# Patient Record
Sex: Female | Born: 1935 | Race: White | Hispanic: No
Health system: Southern US, Community
[De-identification: ages and names within clinical notes are randomized; demographics above are authoritative.]

## PROBLEM LIST (undated history)

## (undated) DIAGNOSIS — I493 Ventricular premature depolarization: Secondary | ICD-10-CM

## (undated) DIAGNOSIS — I447 Left bundle-branch block, unspecified: Secondary | ICD-10-CM

## (undated) DIAGNOSIS — E039 Hypothyroidism, unspecified: Secondary | ICD-10-CM

## (undated) DIAGNOSIS — M1990 Unspecified osteoarthritis, unspecified site: Secondary | ICD-10-CM

## (undated) DIAGNOSIS — E7800 Pure hypercholesterolemia, unspecified: Secondary | ICD-10-CM

## (undated) DIAGNOSIS — J45909 Unspecified asthma, uncomplicated: Secondary | ICD-10-CM

## (undated) DIAGNOSIS — K219 Gastro-esophageal reflux disease without esophagitis: Secondary | ICD-10-CM

## (undated) HISTORY — PX: KNEE ARTHROSCOPY: SHX127

---

## 1999-03-25 ENCOUNTER — Ambulatory Visit (HOSPITAL_COMMUNITY): Admission: RE | Admit: 1999-03-25 | Discharge: 1999-03-25 | Payer: Self-pay | Admitting: Geriatric Medicine

## 1999-10-23 ENCOUNTER — Emergency Department (HOSPITAL_COMMUNITY): Admission: EM | Admit: 1999-10-23 | Discharge: 1999-10-23 | Payer: Self-pay | Admitting: Emergency Medicine

## 1999-11-04 ENCOUNTER — Ambulatory Visit (HOSPITAL_COMMUNITY): Admission: RE | Admit: 1999-11-04 | Discharge: 1999-11-04 | Payer: Self-pay | Admitting: Orthopedic Surgery

## 1999-11-07 ENCOUNTER — Encounter: Admission: RE | Admit: 1999-11-07 | Discharge: 1999-11-07 | Payer: Self-pay | Admitting: Orthopedic Surgery

## 1999-11-07 ENCOUNTER — Encounter: Payer: Self-pay | Admitting: Orthopedic Surgery

## 2000-07-27 ENCOUNTER — Encounter: Admission: RE | Admit: 2000-07-27 | Discharge: 2000-07-27 | Payer: Self-pay | Admitting: Geriatric Medicine

## 2000-07-27 ENCOUNTER — Encounter: Payer: Self-pay | Admitting: Geriatric Medicine

## 2000-09-02 ENCOUNTER — Encounter: Admission: RE | Admit: 2000-09-02 | Discharge: 2000-09-02 | Payer: Self-pay | Admitting: Geriatric Medicine

## 2000-09-02 ENCOUNTER — Encounter: Payer: Self-pay | Admitting: Geriatric Medicine

## 2000-10-13 ENCOUNTER — Ambulatory Visit (HOSPITAL_COMMUNITY): Admission: RE | Admit: 2000-10-13 | Discharge: 2000-10-13 | Payer: Self-pay | Admitting: Gastroenterology

## 2002-11-21 ENCOUNTER — Emergency Department (HOSPITAL_COMMUNITY): Admission: EM | Admit: 2002-11-21 | Discharge: 2002-11-21 | Payer: Self-pay | Admitting: Emergency Medicine

## 2003-11-21 ENCOUNTER — Other Ambulatory Visit: Admission: RE | Admit: 2003-11-21 | Discharge: 2003-11-21 | Payer: Self-pay | Admitting: Geriatric Medicine

## 2003-11-24 ENCOUNTER — Encounter: Admission: RE | Admit: 2003-11-24 | Discharge: 2003-11-24 | Payer: Self-pay | Admitting: Geriatric Medicine

## 2004-05-19 ENCOUNTER — Emergency Department (HOSPITAL_COMMUNITY): Admission: EM | Admit: 2004-05-19 | Discharge: 2004-05-19 | Payer: Self-pay | Admitting: Family Medicine

## 2004-10-05 ENCOUNTER — Emergency Department (HOSPITAL_COMMUNITY): Admission: EM | Admit: 2004-10-05 | Discharge: 2004-10-05 | Payer: Self-pay | Admitting: Family Medicine

## 2005-01-12 ENCOUNTER — Emergency Department (HOSPITAL_COMMUNITY): Admission: EM | Admit: 2005-01-12 | Discharge: 2005-01-12 | Payer: Self-pay | Admitting: Family Medicine

## 2005-02-25 ENCOUNTER — Emergency Department (HOSPITAL_COMMUNITY): Admission: EM | Admit: 2005-02-25 | Discharge: 2005-02-25 | Payer: Self-pay | Admitting: Family Medicine

## 2005-04-13 ENCOUNTER — Emergency Department (HOSPITAL_COMMUNITY): Admission: EM | Admit: 2005-04-13 | Discharge: 2005-04-13 | Payer: Self-pay | Admitting: Family Medicine

## 2005-04-27 ENCOUNTER — Emergency Department (HOSPITAL_COMMUNITY): Admission: EM | Admit: 2005-04-27 | Discharge: 2005-04-27 | Payer: Self-pay | Admitting: Family Medicine

## 2005-09-10 ENCOUNTER — Encounter: Admission: RE | Admit: 2005-09-10 | Discharge: 2005-09-10 | Payer: Self-pay | Admitting: Geriatric Medicine

## 2008-10-25 ENCOUNTER — Encounter: Admission: RE | Admit: 2008-10-25 | Discharge: 2008-10-25 | Payer: Self-pay | Admitting: Allergy

## 2009-11-05 ENCOUNTER — Encounter: Admission: RE | Admit: 2009-11-05 | Discharge: 2009-11-05 | Payer: Self-pay | Admitting: Allergy

## 2009-11-05 ENCOUNTER — Encounter: Payer: Self-pay | Admitting: Pulmonary Disease

## 2009-12-14 ENCOUNTER — Encounter: Payer: Self-pay | Admitting: Pulmonary Disease

## 2010-02-13 DIAGNOSIS — J309 Allergic rhinitis, unspecified: Secondary | ICD-10-CM | POA: Insufficient documentation

## 2010-02-13 DIAGNOSIS — K219 Gastro-esophageal reflux disease without esophagitis: Secondary | ICD-10-CM | POA: Insufficient documentation

## 2010-02-14 ENCOUNTER — Ambulatory Visit: Payer: Self-pay | Admitting: Pulmonary Disease

## 2010-02-14 DIAGNOSIS — R0789 Other chest pain: Secondary | ICD-10-CM | POA: Insufficient documentation

## 2010-02-14 DIAGNOSIS — R490 Dysphonia: Secondary | ICD-10-CM | POA: Insufficient documentation

## 2010-02-14 DIAGNOSIS — J45909 Unspecified asthma, uncomplicated: Secondary | ICD-10-CM | POA: Insufficient documentation

## 2010-02-15 ENCOUNTER — Telehealth (INDEPENDENT_AMBULATORY_CARE_PROVIDER_SITE_OTHER): Payer: Self-pay | Admitting: *Deleted

## 2010-02-15 ENCOUNTER — Encounter: Payer: Self-pay | Admitting: Pulmonary Disease

## 2010-03-08 ENCOUNTER — Telehealth: Payer: Self-pay | Admitting: Pulmonary Disease

## 2010-03-12 ENCOUNTER — Telehealth: Payer: Self-pay | Admitting: Pulmonary Disease

## 2010-03-12 ENCOUNTER — Ambulatory Visit: Payer: Self-pay | Admitting: Pulmonary Disease

## 2010-03-12 LAB — CONVERTED CEMR LAB
BUN: 11 mg/dL (ref 6–23)
Calcium: 9.1 mg/dL (ref 8.4–10.5)
Creatinine, Ser: 0.8 mg/dL (ref 0.4–1.2)
GFR calc non Af Amer: 74.74 mL/min (ref >60)
Glucose, Bld: 85 mg/dL (ref 70–99)

## 2010-03-14 ENCOUNTER — Ambulatory Visit: Payer: Self-pay | Admitting: Internal Medicine

## 2010-03-15 ENCOUNTER — Telehealth (INDEPENDENT_AMBULATORY_CARE_PROVIDER_SITE_OTHER): Payer: Self-pay | Admitting: *Deleted

## 2010-03-17 ENCOUNTER — Emergency Department (HOSPITAL_COMMUNITY): Admission: EM | Admit: 2010-03-17 | Discharge: 2010-03-17 | Payer: Self-pay | Admitting: Family Medicine

## 2010-03-19 ENCOUNTER — Telehealth (INDEPENDENT_AMBULATORY_CARE_PROVIDER_SITE_OTHER): Payer: Self-pay | Admitting: *Deleted

## 2010-03-22 ENCOUNTER — Telehealth (INDEPENDENT_AMBULATORY_CARE_PROVIDER_SITE_OTHER): Payer: Self-pay | Admitting: *Deleted

## 2010-04-17 ENCOUNTER — Ambulatory Visit: Payer: Self-pay | Admitting: Pulmonary Disease

## 2010-04-19 ENCOUNTER — Telehealth (INDEPENDENT_AMBULATORY_CARE_PROVIDER_SITE_OTHER): Payer: Self-pay | Admitting: *Deleted

## 2010-05-02 ENCOUNTER — Encounter: Payer: Self-pay | Admitting: Pulmonary Disease

## 2010-07-04 NOTE — Assessment & Plan Note (Signed)
Summary: consult for chest pain, hoarseness, and asthma   Visit Type:  Initial Consult Copy to:  Patrice Paradise MD Primary Chirsty Armistead/Referring Kaelynn Igo:  Doran Durand MD  CC:  pulmonary consult.  History of Present Illness: The pt is a 77y/o female who I have been asked to see for multiple pulmonary symptoms and ?asthma control.  The pt has a h/o asthma diagnosed over 5 years ago, and tells me she was doing well until June of this year.  She began to have discomfort in her ribs, and they were sore to the touch.  She also developed sob, along with various upper airway symptoms.  She describes these as a hoarseness and classic globus sensation.  She denies true cough, but would feel like she was choking at times.  She was treated with a course of steroids, abx, increased advair to 500 strength.  She improved, but has occurred 2 more times.  She currently has pain in her ribs, but it is not pleuritic.  She feels along the lower costal margin and lower sternum, and radiates around into her back.  She denies any sob with sweeping, bringing groceries in from the car.  She will get winded going to mailbox.  Her weight has not changed significantly.  She has changed her advair to dulera just 2 days ago.  She had a recent cxr in June that was totally wnl.  Preventive Screening-Counseling & Management  Alcohol-Tobacco     Smoking Status: never  Past History:  Past Medical History: h/o asthma--followed by Dr. Malachi Carl E R D (ICD-530.81) ALLERGIC RHINITIS (ICD-477.9)    Past Surgical History: Knee surgery  Family History: Reviewed history and no changes required. MI--mother CHF--sister,brother Throat Cancer--Father Leukemia--brother, neice  Social History: Reviewed history and no changes required. Patient never smoked.  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  The patient denies productive cough, non-productive cough, coughing up blood, acid heartburn, indigestion, abdominal pain, difficulty swallowing, sore throat, tooth/dental problems, headaches, nasal congestion/difficulty breathing through nose, itching, depression, hand/feet swelling, rash, and fever.    Vital Signs:  Patient profile:   77 year old female Height:      62 inches Weight:      150.25 pounds BMI:     27.58 O2 Sat:      96 % on Room air Pulse rate:   78 / minute BP sitting:   130 / 84  (right arm) Cuff size:   regular  Vitals Entered By: Carver Fila (February 14, 2010 2:14 PM)  O2 Flow:  Room air CC: pulmonary consult Comments meds and allergies updated Phone number updated Carver Fila  February 14, 2010 2:14 PM    Physical Exam  General:  mildly ow female in nad Eyes:  PERRLA and EOMI.   Nose:  patent without discharge or purulence Mouth:  clear, no exudates or other abnormalities Neck:  no jvd, tmg, LN Lungs:  totally clear to auscultation, no wheezing or rhonchi decreased diaphragm excursion which interferes with depth of inspiration. Heart:  rrr, no mrg Abdomen:  soft and nontender, bs+ Extremities:  mild ankle edema, pulses intact distally no cyanosis  Neurologic:  alert and oriented, moves all 4.  Impression & Recommendations:  Problem # 1:  INTRINSIC ASTHMA, UNSPECIFIED (ICD-493.10) I do not feel any of her current symptoms are due to asthma.  Her spirometry is normal, and she is on a very good asthma regimen.  I support the change from advair to dulera to minimize irritation to the upper airway.  Problem # 2:  CHEST PAIN, ATYPICAL (ICD-786.59) her description of the pain sounds more MSK or radicular in nature.  She may need spine films to r/o vertebral fx with  nerve compression.  I cannot r/o VTE, but the history is not overly suggestive of this.  If she continues to have issues, can check ct chest to evaluate for intrathoracic pathology.  Problem # 3:  HOARSENESS (ZOX-096.04) the pt describes a classic globus sensation and hoarseness.  I have explained to her this is frequently due to postnasal drip, LPR, and meds.  I would like to intensify her reflux meds, add chlorpheniramine at bedtime, and agree with change to dulera.  I am unable to come up with a unifying diagnosis for her costal margin pain, asthma, and upper airway symptoms.  Medications Added to Medication List This Visit: 1)  Dulera 200-5 Mcg/act Aero (Mometasone furo-formoterol fum) .... One puff  two times a day 2)  Xopenex Hfa 45 Mcg/act Aero (Levalbuterol tartrate) .... One puff every 6 hrs as needed 3)  Saline Nasal Sprays  .... One spray in each nostrile two times a day 4)  Lovastatin 20 Mg Tabs (Lovastatin) .... Once daily 5)  Levothroid 100 Mcg Tabs (Levothyroxine sodium) .... Once daily 6)  Fish Oil 1000 Mg Caps (Omega-3 fatty acids) .... One tab three times a day 7)  Vitamin D3 1000 Unit Caps (Cholecalciferol) .Marland Kitchen.. 1 tab three times a day 8)  Calcium-magnesium-zinc 750-375-15 Mg Tabs (Calcium-magnesium-zinc) .Marland Kitchen.. 1 tab three times a day 9)  Omeprazole 40 Mg Cpdr (Omeprazole) .... One in am and pm  Other Orders: Consultation Level IV (54098)  Patient Instructions: 1)  stay on dulera, singulair, but will reduce dulera to the 100/5 strength. 2)  will change your omeprazole to 40mg  one in am and pm for next 2 weeks 3)  put fish oil in freezer, and take from there each day.  4)  trial of chlorpheniramine 8mg  at bedtime each night for next few weeks. 5)  use albuterol for rescue rather than xopenex. 6)  please call in 2 weeks to let me know how things are going.  If still having issues, will consider scan of your chest.  Prescriptions: OMEPRAZOLE 40 MG CPDR (OMEPRAZOLE) one in  am and pm  #60 x 0   Entered and Authorized by:   Barbaraann Share MD   Signed by:   Barbaraann Share MD on 02/14/2010   Method used:   Print then Give to Patient   RxID:   [id number]    Immunization History:  Influenza Immunization History:    Influenza:  historical (03/02/2009)

## 2010-07-04 NOTE — Progress Notes (Signed)
Summary: clarification on medications  Phone Note Call from Patient Call back at Home Phone [phone]   Caller: Patient Call For: clance Summary of Call: pt has a question re: meds.  Initial call taken by: Tivis Ringer, CNA,  February 15, 2010 8:57 AM  Follow-up for Phone Call        called spoke with patient who is requesting some clarification on her meds from consult yesterday: instruction sheet says to continue the singulair, but she would like to know if she is to continue the claritin 10mg  two times a day?  pt would also like some clarification on the differences between claritin, singulair and the chlor-tabs.   also, she understands that the dulera was decreased to 100-64mcg, but is she to take 1 puff two times a day or 2?  med list states 1 puff two times a day of the 200-5, but patient became a little confused when reading the pamplet included with her sample of dulera given at ov.  note: patient took 2 puffs dulera this morning.  please advise, thanks! Follow-up by: Boone Master CNA/MA,  February 15, 2010 10:11 AM  Additional Follow-up for Phone Call Additional follow up Details #1::        1) dulera is supposed to be 100/5 2 puffs am and pm.  the higher strength of dulera can be irritating to the airway. 2) the claritin is supposed to be dosed once a day..she is to take this in am, chlor tabs at hs.  chlor tab is a stronger antihistamine than claritin.  singulair is a completely different drug. Additional Follow-up by: Barbaraann Share MD,  February 15, 2010 4:31 PM    Additional Follow-up for Phone Call Additional follow up Details #2::    called spoke with patient, advised of KC's recs as  stated above.  pt was very grateful and verbalized her understanding.  claritin dosing changed on med list. Follow-up by: Boone Master CNA/MA,  February 15, 2010 5:04 PM  New/Updated Medications: CLARITIN 10 MG TABS (LORATADINE) Take 1 tablet by mouth every morning

## 2010-07-04 NOTE — Progress Notes (Signed)
Summary: question about med  Phone Note Call from Patient   Caller: Patient Call For: clance Summary of Call: need to no if she should take  omeprazole in the am or pm and should she stay on chlortabs or aller clear Initial call taken by: Rickard Patience,  March 22, 2010 10:23 AM  Follow-up for Phone Call        advised pt to call primary for questions and refills for the omeprazole per kc's last phone note documentation, pt was fine with this Follow-up by: Philipp Deputy CMA,  March 22, 2010 11:12 AM

## 2010-07-04 NOTE — Assessment & Plan Note (Signed)
Summary: rov for asthma, hoarseness.   Visit Type:  Follow-up Copy to:  Patrice Paradise MD Primary Provider/Referring Provider:  Doran Durand MD  CC:  f/u. pt states her breathing has been "fine". pt c/o raspy voice and post nasal drip. pt denies any cough. .  History of Present Illness: the pt comes in today for f/u of her known asthma and upper irritation.  She has done well on dulera from an asthma standpoint, with less ua irritation.  She still has some globus sensation and throat clearing, and tells me that when she was on omeprazole 40mg  two times a day this was much better.  She has had no further chest pain.  Current Medications (verified): 1)  Claritin 10 Mg Tabs (Loratadine) .... Take 1 Tablet By Mouth Every Morning 2)  Proair Hfa 108 (90 Base) Mcg/act  Aers (Albuterol Sulfate) .Marland Kitchen.. 1-2 Puffs Every 4-6 Hours As Needed 3)  Singulair 10 Mg Tabs (Montelukast Sodium) .... Take 1 Tablet By Mouth Once A Day 4)  Astelin 137 Mcg/spray Soln (Azelastine Hcl) .... 2 Sprays in Each Nostril Two Times A Day 5)  Dulera 200-5 Mcg/act Aero (Mometasone Furo-Formoterol Fum) .... One Puff  Two Times A Day 6)  Saline Nasal Sprays .... One Spray in Each Nostrile Two Times A Day 7)  Lovastatin 20 Mg Tabs (Lovastatin) .... Once Daily 8)  Levothroid 100 Mcg Tabs (Levothyroxine Sodium) .... Once Daily 9)  Fish Oil 1000 Mg Caps (Omega-3 Fatty Acids) .... One Tab Three Times A Day 10)  Vitamin D3 1000 Unit Caps (Cholecalciferol) .Marland Kitchen.. 1 Tab Three Times A Day 11)  Calcium-Magnesium-Zinc 750-375-15 Mg Tabs (Calcium-Magnesium-Zinc) .Marland Kitchen.. 1 Tab Three Times A Day 12)  Omeprazole 40 Mg Cpdr (Omeprazole) .... Take 1 Tablet By Mouth Once A Day 13)  Probiotic  Caps (Probiotic Product) .... Once Daily 14)  Stool Softener 100 Mg Caps (Docusate Sodium) .... Once Daily As Needed 15)  Artificial Tears  Soln (Artificial Tear Solution) .... As Needed  Allergies (verified): 1)  ! Pcn 2)  ! Erythromycin  Social  History: Patient never smoked.  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  The patient denies shortness of breath with activity, shortness of breath at rest, productive cough, non-productive cough, coughing up blood, chest pain, irregular heartbeats, acid heartburn, indigestion, weight change, abdominal pain, difficulty swallowing, sore throat, tooth/dental problems, headaches, nasal congestion/difficulty breathing through nose, sneezing, itching, ear ache, anxiety, depression, joint stiffness or pain, rash, change in color of mucus, and fever.    Vital Signs:  Patient profile:   77 year old female Height:      62 inches Weight:      150 pounds BMI:     27.53 O2 Sat:      96 % on Room air Temp:     97.7 degrees F oral Pulse rate:   73 / minute BP sitting:   120 / 76  (left arm) Cuff size:   regular  Vitals Entered By: Carver Fila (April 17, 2010 2:41 PM)  O2 Flow:  Room air CC: f/u. pt states her breathing has been "fine". pt c/o raspy voice, post nasal drip. pt denies any cough.  Comments meds and allergies updated Phone number updated Carver Fila  April 17, 2010 2:41 PM    Physical Exam  General:  wd female in nad Nose:  no purulence or discharge noted. Lungs:  totally clear to auscultation Heart:  rrr, no mrg Extremities:  no edema or cyanosis noted. Neurologic:  alert and oriented, moves all 4.   Impression & Recommendations:  Problem # 1:  INTRINSIC ASTHMA, UNSPECIFIED (ICD-493.10) the pt is doing well from an asthma standpoint, with rare rescue inhaler use.  Problem # 2:  HOARSENESS (ZOX-096.04)  I continue to believe this is due to postnasal drip and reflux.  I have asked her to stay on dulera since this is less irritating to the upper airway.  I think she will do better on two times a day PPI since she saw a difference in her hoarseness while taking  this.  Medications Added to Medication List This Visit: 1)  Probiotic Caps (Probiotic product) .... Once daily 2)  Stool Softener 100 Mg Caps (Docusate sodium) .... Once daily as needed 3)  Artificial Tears Soln (Artificial tear solution) .... As needed 4)  Omeprazole 40 Mg Cpdr (Omeprazole) .... One am and pm 5)  Dulera 100-5 Mcg/act Aero (Mometasone furo-formoterol fum) .... 2 puffs twice per day  Other Orders: Est. Patient Level III (54098)  Patient Instructions: 1)  stay on dulera 100/5  2 inhalatons am and pm...rinse mouth well 2)  increase omeprazole to 40mg  am and pm since this has helped in the past.   3)  followup with me in one year, but call if having problems.  Prescriptions: DULERA 100-5 MCG/ACT AERO (MOMETASONE FURO-FORMOTEROL FUM) 2 puffs twice per day  #3 x 6   Entered and Authorized by:   Barbaraann Share MD   Signed by:   Barbaraann Share MD on 04/17/2010   Method used:   Print then Give to Patient   RxID:   [id number] OMEPRAZOLE 40 MG CPDR (OMEPRAZOLE) one am and pm  #180 x 6   Entered and Authorized by:   Barbaraann Share MD   Signed by:   Barbaraann Share MD on 04/17/2010   Method used:   Print then Give to Patient   RxID:   [id number]    Immunization History:  Influenza Immunization History:    Influenza:  historical (03/02/2010)

## 2010-07-04 NOTE — Letter (Signed)
Summary: [facility]  [facility]   Imported By: Sherian Rein 02/19/2010 09:47:24  _____________________________________________________________________  External Attachment:    Type:   Image     Comment:   External Document

## 2010-07-04 NOTE — Medication Information (Signed)
Summary: [employer]   Imported By: Valinda Hoar 05/02/2010 16:44:31  _____________________________________________________________________  External Attachment:    Type:   Image     Comment:   External Document

## 2010-07-04 NOTE — Progress Notes (Signed)
Summary: omeprazole rx  40mg  once daily   Phone Note Call from Patient Call back at Home Phone [phone]   Caller: Patient Call For: clance Summary of Call: pt wants to know if she should get refill of omeprazole from kc since he increased her dosage to 40mg  or should she get rx from dr Rosemont Callas who had her on 20mg ? [facility].  Initial call taken by: Tivis Ringer, CNA,  March 19, 2010 9:31 AM  Follow-up for Phone Call        [facility], please advise if you will ok refills.  Pt was originally given rx for Omeprazole 20mg  by Dr. Epes Callas but Jonathon Bellows increased pt's Omeprazole to 40mg  per consult note on 02/14/2010 and was given rx for # 60 x 0 refills and was told to take x 2 weeks.  However,  pt called back on 03/08/2010 (see phone note for complete details).  Asked if pt's cough and hoarseness have improved on increased dose of omeprazole.  pt states she never had a cough but would clear her throat a lot- and pt states this has improved along with the hoarseness.  Will forward message to [facility] to address.  Aundra Millet Reynolds LPN  March 19, 2010 10:46 AM   Additional Follow-up for Phone Call Additional follow up Details #1::        I gave her enough for 30 days at 40mg  two times a day.  Don't mind giving her a prescription for 40mg  once a day (that is what she should decrease to) with no fills (# 30,no fills) since this involves her reflux and not a pulmonary issue, would prefer this be managed by her primary md after the above prescription runs out.   Additional Follow-up by: Barbaraann Share MD,  March 19, 2010 12:47 PM    Additional Follow-up for Phone Call Additional follow up Details #2::    [id number].  Aundra Millet Reynolds LPN  March 19, 2010 1:13 PM  Pt is returning call from triage. call her at home ph# now (cell phone is "dying"). Tivis Ringer, CNA  March 19, 2010 4:29 PM  called and spoke with pt. pt is aware of KC's recs.  Informed pt Omeprazole 40mg  once daily sent to pharmacy for 1 month  only and then future refills will need to go to pt's pcp.  Pt verbalized her understanding. Aundra Millet Reynolds LPN  March 19, 2010 4:56 PM   New/Updated Medications: OMEPRAZOLE 40 MG CPDR (OMEPRAZOLE) Take 1 tablet by mouth once a day Prescriptions: OMEPRAZOLE 40 MG CPDR (OMEPRAZOLE) Take 1 tablet by mouth once a day  #30 x 0   Entered by:   Arman Filter LPN   Authorized by:   Barbaraann Share MD   Signed by:   Arman Filter LPN on 09/81/1914   Method used:   Electronically to        [id number] (retail)       [address] [place], [place]  [address]       Ph: [phone]       Fax: [phone]   RxID:   [id number]

## 2010-07-04 NOTE — Progress Notes (Signed)
  Phone Note Call from Patient   Caller: libby Call For: Samantha [name] Summary of Call: pt has ct chest pulmonary emboli protocol@[facility]  03/14/10@2 :00pm when does she need to see you? Initial call taken by: Oneita Jolly,  March 12, 2010 9:23 AM  Follow-up for Phone Call        let her know that I will call her whenever I get the results. Follow-up by: Barbaraann Share MD,  March 12, 2010 12:12 PM

## 2010-07-04 NOTE — Progress Notes (Signed)
SummaryElwin Sleight PA  Phone Note Outgoing Call Call back at [phone]   Call placed by: Vernie Murders,  April 19, 2010 2:00 PM Call placed to: Insurer Summary of Call: Recieved PA request for dulera from the [facility].  Called [facility] to initiate- form placed in KC's lookat to be done. Initial call taken by: Vernie Murders,  April 19, 2010 2:21 PM  Follow-up for Phone Call        Form was filled out by [facility] and faxed to [facility].  Follow-up by: Vernie Murders,  May 01, 2010 4:30 PM  Additional Follow-up for Phone Call Additional follow up Details #1::        Recieved PA approval for this med through 05/03/11.  I faxed this information to the [employer] and scanned in approval letter. Additional Follow-up by: Vernie Murders,  May 02, 2010 3:11 PM

## 2010-07-04 NOTE — Progress Notes (Signed)
Summary: informed pt of results  Phone Note Call from Patient Call back at Home Phone [phone]   Caller: Patient Call For: clance Summary of Call: Returning Mindy's call. Initial call taken by: Darletta Moll,  March 15, 2010 8:16 AM  Follow-up for Phone Call        called and spoke wit pt and informed her of her results.  Carver Fila  March 15, 2010 1:33 PM

## 2010-07-04 NOTE — Progress Notes (Signed)
Summary: note from patient-  Phone Note Call from Patient Call back at Home Phone [phone]   Caller: pt Call For: Clance Summary of Call: Pt came by the office and dropped off a note for [facility] nurse. The note has several questions on it. The first question was did [facility] fax diagnosis and medication changes to Dr. Craven Callas, and Dr. Allena Katz. I advised that this has been done per EMR.  The second part of the note states that the pt symptoms of sore ribs, phlegm, and some SOB subsided for a week but recently the pain under her ribs has returned. Pt wants to know could this be associated with pleurisy or her heart? Pt also wants to know should she continue omeprazole 40mg ? I have LMTCBx1 to discuss all of the above with the patient. Per TD we need to tell the pt that we do not accept handwritten letters. Initial call taken by: Carron Curie CMA,  March 08, 2010 4:38 PM  Follow-up for Phone Call        pt returned call. call cell [phone]. Tivis Ringer, CNA  March 11, 2010 8:40 AM  called pt back.  Pt was seen by [facility] on 02-14-2010 for a pulmonary Consult and was told to call back in 2 weeks to give an update.  Pt states she is following KC's recs..... 1) dulera 100/5 2 puffs two times a day.  Pt states she sees " little improvement in her breathing."  Still has periods of sob with exertion. 2) pt is still taking Chlorpheniramine 8mg  at bedtime. Does she need to continue this. 3)  Pt states she is also still on Omeprazole 40mg  two times a day. Does she need to continue this at this strength as well?  If so, do you want to send refills or do you want her to get them from her pcp? 4)  Pt states after following KC's recs she noticed her symptoms improved x 1 week but has since restarted...she notices her "ribs are sore on the inside" again ( states it doesn't hurt to touch her ribs but states it's painful from the inside)  coughing but unable to get anything up and sob.  Will forward message to [facility] to  address.  Aundra Millet Reynolds LPN  March 11, 2010 9:54 AM   Additional Follow-up for Phone Call Additional follow up Details #1::        she had 3 issues: 1) asthma.Marland KitchenMarland KitchenI do not believe this has anything to do with her sob.  I want her to stay on dulera for maintenance since it is less irritating to the upper airway compared to advair 2) hoarseness...has her hoarseness and cough improved since being on chlorpheniramine and double dose omeprazole? 3) rib pain.Marland KitchenMarland KitchenMarland KitchenI suspect this has nothing to do with her lungs or heart.  Her description sounds musculoskeletal.  I am willing to do scan of chest if her symptoms are persisting  I suspect that we should do ct chest, then have her come in to discuss these different issues.  see if she is ok with this. Additional Follow-up by: Barbaraann Share MD,  March 11, 2010 4:58 PM    Additional Follow-up for Phone Call Additional follow up Details #2::    pt advised of KC recs.  Pt wants to do CT of chest. Per [facility] CHest with contrast and appt to see him after. Order placed.Carron Curie CMA  March 11, 2010 5:23 PM

## 2010-10-03 ENCOUNTER — Encounter (HOSPITAL_COMMUNITY): Payer: Self-pay

## 2010-10-18 NOTE — Procedures (Signed)
[place]  Patient:    Samantha [name], Samantha [name]                         MRN: [id number] Proc. Date: 10/13/00 Attending:  Verlin Grills, M.D. CC:         Hal T. Stoneking, M.D.   Procedure Report  PROCEDURE:  Screening colonoscopy.  REFERRING PHYSICIAN:  Hal T. Stoneking, M.D.  PROCEDURE INDICATION:  Ms. Mykaylah Ballman (date of birth 01-12-1930) is a 77 year old female who is due for screening colonoscopy with possible polypectomy to prevent colon cancer.  I discussed with Ms. Herrada the complications associated with colonoscopy and polypectomy, including intestinal bleeding and intestinal perforation. Ms. Tatlock has signed the operative permit.  ENDOSCOPIST:  Verlin Grills, M.D.  PREMEDICATION:  Versed 5 mg, fentanyl 50 mcg.  ENDOSCOPE:  Olympus pediatric colonoscope.  DESCRIPTION OF PROCEDURE:  After obtaining informed consent, the patient was placed in the left lateral decubitus position.  I administered intravenous fentanyl and intravenous Versed to achieve conscious sedation for the procedure.  The patients blood pressure, oxygen saturation, and cardiac rhythm were monitored throughout the procedure and documented in the medical record.  Anal inspection was normal.  Digital rectal exam was normal.  The Olympus pediatric video colonoscope was introduced into the rectum and under direct vision advanced to the cecum, as identified by a normal-appearing ileocecal valve.  Colonic preparation for the exam today was excellent.  Rectum normal.  Sigmoid colon and descending colon:  Left colonic diverticulosis.  Splenic flexure normal.  Transverse colon normal.  Hepatic flexure normal.  Ascending colon normal.  Cecum and ileocecal valve normal.  ASSESSMENT:  Left colonic diverticulosis; otherwise, normal proctocolonoscopy to the cecum.  No endoscopic evidence for the presence of colorectal neoplasia. DD:  10/13/00 TD:   10/13/00 Job: [id number]

## 2011-03-15 ENCOUNTER — Inpatient Hospital Stay (INDEPENDENT_AMBULATORY_CARE_PROVIDER_SITE_OTHER)
Admission: RE | Admit: 2011-03-15 | Discharge: 2011-03-15 | Disposition: A | Discharge status: Home / Self Care | Payer: Medicare Other | Source: Ambulatory Visit | Attending: Family Medicine | Admitting: Family Medicine

## 2011-03-15 DIAGNOSIS — R197 Diarrhea, unspecified: Secondary | ICD-10-CM

## 2011-03-15 DIAGNOSIS — B9789 Other viral agents as the cause of diseases classified elsewhere: Secondary | ICD-10-CM

## 2012-09-27 ENCOUNTER — Ambulatory Visit
Admission: RE | Admit: 2012-09-27 | Discharge: 2012-09-27 | Disposition: A | Discharge status: Home / Self Care | Payer: Medicare Other | Source: Ambulatory Visit | Attending: Chiropractic Medicine | Admitting: Chiropractic Medicine

## 2012-09-27 ENCOUNTER — Other Ambulatory Visit: Payer: Self-pay | Admitting: Chiropractic Medicine

## 2012-09-27 DIAGNOSIS — R52 Pain, unspecified: Secondary | ICD-10-CM

## 2014-05-22 ENCOUNTER — Observation Stay (HOSPITAL_COMMUNITY)
Admission: EM | Admit: 2014-05-22 | Discharge: 2014-05-23 | Disposition: A | Discharge status: Home / Self Care | Payer: Medicare HMO | Attending: Cardiology | Admitting: Cardiology

## 2014-05-22 ENCOUNTER — Emergency Department (HOSPITAL_COMMUNITY): Payer: Medicare HMO

## 2014-05-22 ENCOUNTER — Encounter (HOSPITAL_COMMUNITY): Payer: Self-pay | Admitting: Emergency Medicine

## 2014-05-22 DIAGNOSIS — E039 Hypothyroidism, unspecified: Secondary | ICD-10-CM | POA: Insufficient documentation

## 2014-05-22 DIAGNOSIS — I493 Ventricular premature depolarization: Secondary | ICD-10-CM | POA: Insufficient documentation

## 2014-05-22 DIAGNOSIS — I447 Left bundle-branch block, unspecified: Secondary | ICD-10-CM | POA: Insufficient documentation

## 2014-05-22 DIAGNOSIS — K219 Gastro-esophageal reflux disease without esophagitis: Secondary | ICD-10-CM | POA: Diagnosis not present

## 2014-05-22 DIAGNOSIS — E785 Hyperlipidemia, unspecified: Secondary | ICD-10-CM | POA: Diagnosis present

## 2014-05-22 DIAGNOSIS — E780 Pure hypercholesterolemia: Secondary | ICD-10-CM | POA: Diagnosis not present

## 2014-05-22 DIAGNOSIS — Z880 Allergy status to penicillin: Secondary | ICD-10-CM | POA: Diagnosis not present

## 2014-05-22 DIAGNOSIS — M1990 Unspecified osteoarthritis, unspecified site: Secondary | ICD-10-CM | POA: Insufficient documentation

## 2014-05-22 DIAGNOSIS — J45909 Unspecified asthma, uncomplicated: Secondary | ICD-10-CM | POA: Insufficient documentation

## 2014-05-22 DIAGNOSIS — R079 Chest pain, unspecified: Principal | ICD-10-CM | POA: Insufficient documentation

## 2014-05-22 DIAGNOSIS — Z7951 Long term (current) use of inhaled steroids: Secondary | ICD-10-CM | POA: Diagnosis not present

## 2014-05-22 DIAGNOSIS — Z79899 Other long term (current) drug therapy: Secondary | ICD-10-CM | POA: Insufficient documentation

## 2014-05-22 DIAGNOSIS — R0789 Other chest pain: Secondary | ICD-10-CM

## 2014-05-22 DIAGNOSIS — Z792 Long term (current) use of antibiotics: Secondary | ICD-10-CM | POA: Diagnosis not present

## 2014-05-22 HISTORY — DX: Hypothyroidism, unspecified: E03.9

## 2014-05-22 HISTORY — DX: Unspecified osteoarthritis, unspecified site: M19.90

## 2014-05-22 HISTORY — DX: Gastro-esophageal reflux disease without esophagitis: K21.9

## 2014-05-22 HISTORY — DX: Unspecified asthma, uncomplicated: J45.909

## 2014-05-22 HISTORY — DX: Pure hypercholesterolemia, unspecified: E78.00

## 2014-05-22 HISTORY — DX: Ventricular premature depolarization: I49.3

## 2014-05-22 HISTORY — DX: Left bundle-branch block, unspecified: I44.7

## 2014-05-22 LAB — COMPREHENSIVE METABOLIC PANEL
ALT: 18 U/L (ref 0–35)
AST: 24 U/L (ref 0–37)
Albumin: 3.8 g/dL (ref 3.5–5.2)
Alkaline Phosphatase: 66 U/L (ref 39–117)
Anion gap: 13 (ref 5–15)
BILIRUBIN TOTAL: 0.2 mg/dL — AB (ref 0.3–1.2)
BUN: 15 mg/dL (ref 6–23)
CALCIUM: 9.9 mg/dL (ref 8.4–10.5)
CHLORIDE: 103 meq/L (ref 96–112)
CO2: 23 meq/L (ref 19–32)
CREATININE: 0.86 mg/dL (ref 0.50–1.10)
GFR, EST AFRICAN AMERICAN: 71 mL/min — AB (ref >90)
GFR, EST NON AFRICAN AMERICAN: 61 mL/min — AB (ref >90)
GLUCOSE: 80 mg/dL (ref 70–99)
Potassium: 4.7 mEq/L (ref 3.7–5.3)
Sodium: 139 mEq/L (ref 137–147)
Total Protein: 7.4 g/dL (ref 6.0–8.3)

## 2014-05-22 LAB — CBC
HCT: 39.3 % (ref 36.0–46.0)
HEMOGLOBIN: 12.7 g/dL (ref 12.0–15.0)
MCH: 30.1 pg (ref 26.0–34.0)
MCHC: 32.3 g/dL (ref 30.0–36.0)
MCV: 93.1 fL (ref 78.0–100.0)
Platelets: 222 10*3/uL (ref 150–400)
RBC: 4.22 MIL/uL (ref 3.87–5.11)
RDW: 14.7 % (ref 11.5–15.5)
WBC: 12.2 10*3/uL — ABNORMAL HIGH (ref 4.0–10.5)

## 2014-05-22 LAB — PROTIME-INR
INR: 1.02 (ref 0.00–1.49)
Prothrombin Time: 13.5 seconds (ref 11.6–15.2)

## 2014-05-22 LAB — PRO B NATRIURETIC PEPTIDE: Pro B Natriuretic peptide (BNP): 442.8 pg/mL (ref 0–450)

## 2014-05-22 LAB — I-STAT TROPONIN, ED: TROPONIN I, POC: 0 ng/mL (ref 0.00–0.08)

## 2014-05-22 LAB — TSH: TSH: 3.87 u[IU]/mL (ref 0.350–4.500)

## 2014-05-22 LAB — TROPONIN I

## 2014-05-22 MED ORDER — PANTOPRAZOLE SODIUM 40 MG PO TBEC
80.0000 mg | DELAYED_RELEASE_TABLET | Freq: Every day | ORAL | Status: DC
  Administered 2014-05-23: 40 mg via ORAL
  Filled 2014-05-22: qty 2

## 2014-05-22 MED ORDER — AZELASTINE HCL 0.1 % NA SOLN
1.0000 | Freq: Two times a day (BID) | NASAL | Status: DC
  Administered 2014-05-22: 1 via NASAL
  Filled 2014-05-22: qty 30

## 2014-05-22 MED ORDER — MONTELUKAST SODIUM 10 MG PO TABS
10.0000 mg | ORAL_TABLET | Freq: Every day | ORAL | Status: DC
  Administered 2014-05-22: 10 mg via ORAL
  Filled 2014-05-22: qty 1

## 2014-05-22 MED ORDER — HEPARIN SODIUM (PORCINE) 5000 UNIT/ML IJ SOLN
5000.0000 [IU] | Freq: Three times a day (TID) | INTRAMUSCULAR | Status: DC
  Filled 2014-05-22: qty 1

## 2014-05-22 MED ORDER — LORATADINE 10 MG PO TABS
10.0000 mg | ORAL_TABLET | Freq: Every day | ORAL | Status: DC
  Administered 2014-05-23: 10 mg via ORAL
  Filled 2014-05-22: qty 1

## 2014-05-22 MED ORDER — OMEGA-3-ACID ETHYL ESTERS 1 G PO CAPS
1.0000 g | ORAL_CAPSULE | Freq: Every day | ORAL | Status: DC
  Administered 2014-05-23: 1 g via ORAL
  Filled 2014-05-22 (×2): qty 1

## 2014-05-22 MED ORDER — SODIUM CHLORIDE 0.9 % IJ SOLN
3.0000 mL | Freq: Two times a day (BID) | INTRAMUSCULAR | Status: DC
  Administered 2014-05-22: 3 mL via INTRAVENOUS

## 2014-05-22 MED ORDER — LEVOTHYROXINE SODIUM 88 MCG PO TABS
88.0000 ug | ORAL_TABLET | Freq: Every day | ORAL | Status: DC
  Administered 2014-05-23: 88 ug via ORAL
  Filled 2014-05-22: qty 1

## 2014-05-22 MED ORDER — ASPIRIN 81 MG PO CHEW
324.0000 mg | CHEWABLE_TABLET | ORAL | Status: AC
  Administered 2014-05-22: 324 mg via ORAL
  Filled 2014-05-22: qty 4

## 2014-05-22 MED ORDER — ACETAMINOPHEN 325 MG PO TABS
650.0000 mg | ORAL_TABLET | ORAL | Status: DC | PRN
Start: 2014-05-22 — End: 2014-05-23

## 2014-05-22 MED ORDER — SODIUM CHLORIDE 0.9 % IJ SOLN: 3.0000 mL | INTRAMUSCULAR | Status: DC | PRN

## 2014-05-22 MED ORDER — FLUTICASONE PROPIONATE 50 MCG/ACT NA SUSP
1.0000 | Freq: Every day | NASAL | Status: DC
  Filled 2014-05-22: qty 16

## 2014-05-22 MED ORDER — ONDANSETRON HCL 4 MG/2ML IJ SOLN: 4.0000 mg | Freq: Four times a day (QID) | INTRAMUSCULAR | Status: DC | PRN

## 2014-05-22 MED ORDER — PRAVASTATIN SODIUM 20 MG PO TABS: 20.0000 mg | ORAL_TABLET | Freq: Every day | ORAL | Status: DC

## 2014-05-22 MED ORDER — NITROGLYCERIN 0.4 MG SL SUBL: 0.4000 mg | SUBLINGUAL_TABLET | SUBLINGUAL | Status: DC | PRN

## 2014-05-22 MED ORDER — MOMETASONE FURO-FORMOTEROL FUM 100-5 MCG/ACT IN AERO
2.0000 | INHALATION_SPRAY | Freq: Two times a day (BID) | RESPIRATORY_TRACT | Status: DC
  Filled 2014-05-22: qty 8.8

## 2014-05-22 MED ORDER — SODIUM CHLORIDE 0.9 % IV SOLN: 250.0000 mL | INTRAVENOUS | Status: DC | PRN

## 2014-05-22 MED ORDER — ASPIRIN 300 MG RE SUPP: 300.0000 mg | RECTAL | Status: AC

## 2014-05-22 MED ORDER — ASPIRIN EC 81 MG PO TBEC
81.0000 mg | DELAYED_RELEASE_TABLET | Freq: Every day | ORAL | Status: DC
  Administered 2014-05-23: 81 mg via ORAL
  Filled 2014-05-22: qty 1

## 2014-05-22 NOTE — ED Notes (Signed)
Per ems, pt w/hx of bronchitis, seen at pcp today w/complaint of chest pain, pcp reports trigeminy, but ems reports intermittent mobitz 2.  Pt arrives awake, alert, oriented, c/o mild chest discomfort

## 2014-05-22 NOTE — ED Notes (Signed)
Report attempt x 1 

## 2014-05-22 NOTE — Progress Notes (Signed)
Patient ID: MAEOLA MCHANEY MRN: [id number], DOB/AGE: [birth year]   Admit date: 05/22/2014   Primary Physician: No primary care provider on file. Primary Cardiologist: Rodney Langton  Pt. Profile:  Samantha [name] is a 80 y.o. female with a history of HLD, GERD, hypothyroidism, asthma and no prior cardiac history who presented to [facility] today with chest pain.  ECG in ED revealed presumed new LBBB. No previous tracing to compare.   The patient is generally very healthy and was in her usual state of health until about a week and a half ago when she had a cold and bronchitis. Since that time she's noted some mild central chest discomfort that she cannot qualify as pressure, pain, squeezing, or tightness. She just states that it "does not feel right". She went to her primary care doctor, Dr. Felipa Eth, today for follow-up on her recent diagnosis of bronchitis and mentioned that she had some chest discomfort. Apparently a ECG was done that revealed a presumed new LBBB and ventricular trigeminy. She was sent to the ED for further evaluation.  She does have some shortness of breath although not necessarily associated with the chest discomfort. She also has had some nausea a couple times this week in the morning; however, again not associated with chest discomfort. The chest discomfort comes and goes and is not related to exertion. There is no radiation. She is generally pretty active and is able to do what she likes without DOE or chest pain. SHe does note some SOB when climbing stairs. She used [place] walk around [place] but has not for the last year or so due to ligament injury in her knee. Her mother died of a heart attack in her 48s and her sister died at the age of 90 from congestive heart failure. She did have a [place] remotely and cannot tell me when that was. She does not smoke but does have a long history of secondhand smoke exposure.   Problem List  Past Medical History  Diagnosis  Date  . Asthma   . High cholesterol   . GERD (gastroesophageal reflux disease)   . Thyroid disease     Past Surgical History  Procedure Laterality Date  . Knee arthroscopy       Allergies  Allergies  Allergen Reactions  . Erythromycin Diarrhea  . Neosporin [Neomycin-Bacitracin Zn-Polymyx] Rash  . Penicillins Rash     Home Medications  Prior to Admission medications   Medication Sig Start Date End Date Taking? Authorizing Provider  azelastine (ASTELIN) 0.1 % nasal spray Place 1-2 sprays into both nostrils 2 (two) times daily.  04/18/14  Yes Historical Provider, MD  Calcium-Magnesium-Zinc (CALCIUM-MAGNESUIUM-ZINC PO) Take 1 tablet by mouth daily.    Yes Historical Provider, MD  Cholecalciferol (VITAMIN D PO) Take 1 tablet by mouth daily.    Yes Historical Provider, MD  fluticasone (FLONASE) 50 MCG/ACT nasal spray Place 1 spray into both nostrils daily.   Yes Historical Provider, MD  levothyroxine (SYNTHROID, LEVOTHROID) 88 MCG tablet Take 88 mcg by mouth daily before breakfast.  04/12/14  Yes Historical Provider, MD  loratadine (CLARITIN) 10 MG tablet Take 10 mg by mouth daily.   Yes Historical Provider, MD  lovastatin (MEVACOR) 20 MG tablet Take 20 mg by mouth daily at 6 PM.  04/12/14  Yes Historical Provider, MD  mometasone-formoterol (DULERA) 100-5 MCG/ACT AERO Inhale 2 puffs into the lungs 2 (two) times daily.   Yes Historical Provider, MD  montelukast (SINGULAIR) 10 MG tablet Take 10 mg by mouth at bedtime.  04/12/14  Yes Historical Provider, MD  Omega-3 Fatty Acids (FISH OIL PO) Take 1 capsule by mouth daily.    Yes Historical Provider, MD  omeprazole (PRILOSEC) 40 MG capsule Take 40 mg by mouth 2 (two) times daily.  04/18/14  Yes Historical Provider, MD  OVER THE COUNTER MEDICATION Take 1 capsule by mouth 3 (three) times daily. Stool softener   Yes Historical Provider, MD  PREMARIN vaginal cream  03/07/14   Historical Provider, MD  sulfamethoxazole-trimethoprim (BACTRIM  DS,SEPTRA DS) 800-160 MG per tablet Take 1 tablet by mouth 2 (two) times daily. Finished 7 day course: 05/15/14-05/22/14 05/15/14   Historical Provider, MD    Family History  No family history on file. No family status information on file.     Social History  History   Social History  . Marital Status: Widowed    Spouse Name: N/A    Number of Children: N/A  . Years of Education: N/A   Occupational History  . Not on file.   Social History Main Topics  . Smoking status: Passive Smoke Exposure - Never Smoker  . Smokeless tobacco: Not on file  . Alcohol Use: No  . Drug Use: No  . Sexual Activity: Not on file   Other Topics Concern  . Not on file   Social History Narrative  . No narrative on file     All other systems reviewed and are otherwise negative except as noted above.  Physical Exam  Blood pressure 137/53, pulse 70, temperature 98.1 F (36.7 C), temperature source Oral, resp. rate 15, height 5\' 2"  (1.575 m), weight 147 lb (66.679 kg), SpO2 94 %.  General: Pleasant, NAD Psych: Normal affect. Neuro: Alert and oriented X 3. Moves all extremities spontaneously. HEENT: Normal  Neck: Supple without bruits or JVD. Lungs:  Resp regular and unlabored, CTA. Heart: RRR no s3, s4, or murmurs. Abdomen: Soft, non-tender, non-distended, BS + x 4.  Extremities: No clubbing, cyanosis or edema. DP/PT/Radials 2+ and equal bilaterally.  Labs  No results for input(s): CKTOTAL, CKMB, TROPONINI in the last 72 hours. Lab Results  Component Value Date   WBC 12.2* 05/22/2014   HGB 12.7 05/22/2014   HCT 39.3 05/22/2014   MCV 93.1 05/22/2014   PLT 222 05/22/2014    Recent Labs Lab 05/22/14 1231  NA 139  K 4.7  CL 103  CO2 23  BUN 15  CREATININE 0.86  CALCIUM 9.9  PROT 7.4  BILITOT 0.2*  ALKPHOS 66  ALT 18  AST 24  GLUCOSE 80     Radiology/Studies  No results found.  ECG Sinus rhythm Multiform ventricular premature complexes Left bundle branch block HR  81  ASSESSMENT AND PLAN  Samantha [name] is a 80 y.o. female with a history of HLD, GERD, hypothyroidism, asthma and no prior cardiac history who presented to [facility] today with chest pain.  ECG in ED revealed presumed new LBBB. No previous tracing to compare.   Chest pain- with some RFS including HLD, age, sex, family hx. However, chest pain atypical.  -- Troponin neg x1 -- Presumed new LBBB on ECG -- Will rule out over night and 2D ECHO early in the AM. I spoke with the ECHO department who will see her first ( all techs gone today. ) if CE's remain norm, 2D ECHO with normal function and tele unremarkable, we anticipate she can go home tomorrow.   Hypothyroidism-  continue synthroid  Asthma- continue home meds.   GERD- continue PPI  HLD- continue statin   Signed, Eileen Stanford, PA-C 05/22/2014, 3:37 PM Patient seen and examined. I agree with the assessment and plan as detailed above. See also my additional thoughts below.   At this point it is not clear what the etiology of her chest/epigastric pain and discomfort is. It is not severe. However her EKG at her primary care office revealed PVCs. There was also left bundle branch block of unknown duration. We will admit the patient to monitor her rhythm and to check enzymes. We will obtain a 2-D echo. If her enzymes are negative and she has normal left ventricular function, she can probably go home by midday tomorrow. Based on the current data, I do not feel that she will need an in-hospital stress test. However this will have to be reassessed tomorrow morning.  Dola Argyle, MD, [facility] 05/22/2014 5:21 PM

## 2014-05-22 NOTE — H&P (Signed)
.     Patient ID: Samantha [name] MRN: [id number], DOB/AGE: 1930-01-22   Admit date: 05/22/2014   Primary Physician: No primary care provider on file. Primary Cardiologist: Rodney Langton  Pt. Profile:  Samantha [name] is a 80 y.o. female with a history of HLD, GERD, hypothyroidism, asthma and no prior cardiac history who presented to [facility] today with chest pain.  ECG in ED revealed presumed new LBBB. No previous tracing to compare.   The patient is generally very healthy and was in her usual state of health until about a week and a half ago when she had a cold and bronchitis. Since that time she's noted some mild central chest discomfort that she cannot qualify as pressure, pain, squeezing, or tightness. She just states that it "does not feel right". She went to her primary care doctor, Dr. Felipa Eth, today for follow-up on her recent diagnosis of bronchitis and mentioned that she had some chest discomfort. Apparently a ECG was done that revealed a presumed new LBBB and ventricular trigeminy. She was sent to the ED for further evaluation.  She does have some shortness of breath although not necessarily associated with the chest discomfort. She also has had some nausea a couple times this week in the morning; however, again not associated with chest discomfort. The chest discomfort comes and goes and is not related to exertion. There is no radiation. She is generally pretty active and is able to do what she likes without DOE or chest pain. SHe does note some SOB when climbing stairs. She used to walk around her [place] but has not for the last year or so due to ligament injury in her knee. Her mother died of a heart attack in her 58s and her sister died at the age of 1 from congestive heart failure. She did have a [place] remotely and cannot tell me when that was. She does not smoke but does have a long history of secondhand smoke exposure.   Problem List  Past Medical History    Diagnosis Date  . Asthma   . High cholesterol   . GERD (gastroesophageal reflux disease)   . Thyroid disease     Past Surgical History  Procedure Laterality Date  . Knee arthroscopy       Allergies  Allergies  Allergen Reactions  . Erythromycin Diarrhea  . Neosporin [Neomycin-Bacitracin Zn-Polymyx] Rash  . Penicillins Rash     Home Medications  Prior to Admission medications   Medication Sig Start Date End Date Taking? Authorizing Provider  azelastine (ASTELIN) 0.1 % nasal spray Place 1-2 sprays into both nostrils 2 (two) times daily.  04/18/14  Yes Historical Provider, MD  Calcium-Magnesium-Zinc (CALCIUM-MAGNESUIUM-ZINC PO) Take 1 tablet by mouth daily.    Yes Historical Provider, MD  Cholecalciferol (VITAMIN D PO) Take 1 tablet by mouth daily.    Yes Historical Provider, MD  fluticasone (FLONASE) 50 MCG/ACT nasal spray Place 1 spray into both nostrils daily.   Yes Historical Provider, MD  levothyroxine (SYNTHROID, LEVOTHROID) 88 MCG tablet Take 88 mcg by mouth daily before breakfast.  04/12/14  Yes Historical Provider, MD  loratadine (CLARITIN) 10 MG tablet Take 10 mg by mouth daily.   Yes Historical Provider, MD  lovastatin (MEVACOR) 20 MG tablet Take 20 mg by mouth daily at 6 PM.  04/12/14  Yes Historical Provider, MD  mometasone-formoterol (DULERA) 100-5 MCG/ACT AERO Inhale 2 puffs into the lungs 2 (two) times daily.   Yes  Historical Provider, MD  montelukast (SINGULAIR) 10 MG tablet Take 10 mg by mouth at bedtime.  04/12/14  Yes Historical Provider, MD  Omega-3 Fatty Acids (FISH OIL PO) Take 1 capsule by mouth daily.    Yes Historical Provider, MD  omeprazole (PRILOSEC) 40 MG capsule Take 40 mg by mouth 2 (two) times daily.  04/18/14  Yes Historical Provider, MD  OVER THE COUNTER MEDICATION Take 1 capsule by mouth 3 (three) times daily. Stool softener   Yes Historical Provider, MD  PREMARIN vaginal cream  03/07/14   Historical Provider, MD  sulfamethoxazole-trimethoprim  (BACTRIM DS,SEPTRA DS) 800-160 MG per tablet Take 1 tablet by mouth 2 (two) times daily. Finished 7 day course: 05/15/14-05/22/14 05/15/14   Historical Provider, MD    Family History  No family history on file. No family status information on file.     Social History  History   Social History  . Marital Status: Widowed    Spouse Name: N/A    Number of Children: N/A  . Years of Education: N/A   Occupational History  . Not on file.   Social History Main Topics  . Smoking status: Passive Smoke Exposure - Never Smoker  . Smokeless tobacco: Not on file  . Alcohol Use: No  . Drug Use: No  . Sexual Activity: Not on file   Other Topics Concern  . Not on file   Social History Narrative  . No narrative on file     All other systems reviewed and are otherwise negative except as noted above.  Physical Exam  Blood pressure 137/53, pulse 70, temperature 98.1 F (36.7 C), temperature source Oral, resp. rate 15, height 5\' 2"  (1.575 m), weight 147 lb (66.679 kg), SpO2 94 %.  General: Pleasant, NAD Psych: Normal affect. Neuro: Alert and oriented X 3. Moves all extremities spontaneously. HEENT: Normal  Neck: Supple without bruits or JVD. Lungs:  Resp regular and unlabored, CTA. Heart: RRR no s3, s4, or murmurs. Abdomen: Soft, non-tender, non-distended, BS + x 4.  Extremities: No clubbing, cyanosis or edema. DP/PT/Radials 2+ and equal bilaterally.  Labs  No results for input(s): CKTOTAL, CKMB, TROPONINI in the last 72 hours. Lab Results  Component Value Date   WBC 12.2* 05/22/2014   HGB 12.7 05/22/2014   HCT 39.3 05/22/2014   MCV 93.1 05/22/2014   PLT 222 05/22/2014    Recent Labs Lab 05/22/14 1231  NA 139  K 4.7  CL 103  CO2 23  BUN 15  CREATININE 0.86  CALCIUM 9.9  PROT 7.4  BILITOT 0.2*  ALKPHOS 66  ALT 18  AST 24  GLUCOSE 80     Radiology/Studies  No results found.  ECG Sinus rhythm Multiform ventricular premature complexes Left bundle branch  block HR 81  ASSESSMENT AND PLAN  LENOX LADOUCEUR is a 80 y.o. female with a history of HLD, GERD, hypothyroidism, asthma and no prior cardiac history who presented to [facility] today with chest pain.  ECG in ED revealed presumed new LBBB. No previous tracing to compare.   Chest pain- with some RFS including HLD, age, sex, family hx. However, chest pain atypical.  -- Troponin neg x1 -- Presumed new LBBB on ECG -- Will rule out over night and 2D ECHO early in the AM. I spoke with the ECHO department who will see her first ( all techs gone today. ) if CE's remain norm, 2D ECHO with normal function and tele unremarkable, we anticipate she can go home  tomorrow.   Hypothyroidism- continue synthroid  Asthma- continue home meds.   GERD- continue PPI  HLD- continue statin   Signed, Eileen Stanford, PA-C 05/22/2014, 3:37 PM Patient seen and examined. I agree with the assessment and plan as detailed above. See also my additional thoughts below.   At this point it is not clear what the etiology of her chest/epigastric pain and discomfort is. It is not severe. However her EKG at her primary care office revealed PVCs. There was also left bundle branch block of unknown duration. We will admit the patient to monitor her rhythm and to check enzymes. We will obtain a 2-D echo. If her enzymes are negative and she has normal left ventricular function, she can probably go home by midday tomorrow. Based on the current data, I do not feel that she will need an in-hospital stress test. However this will have to be reassessed tomorrow morning.  Dola Argyle, MD, [facility] 05/22/2014 5:21 PM

## 2014-05-22 NOTE — ED Provider Notes (Signed)
CSN: [id number]     Arrival date & time 05/22/14  1216 History   First MD Initiated Contact with Patient 05/22/14 1243     Chief Complaint  Patient presents with  . Chest Pain     HPI Per ems, pt w/hx of bronchitis, seen at pcp today w/complaint of chest pain, pcp reports trigeminy, but ems reports intermittent mobitz 2. Pt arrives awake, alert, oriented, c/o mild chest discomfort Past Medical History  Diagnosis Date  . Asthma   . High cholesterol   . GERD (gastroesophageal reflux disease)   . Hypothyroidism   . Arthritis   . Ventricular ectopy   . LBBB (left bundle branch block)    Past Surgical History  Procedure Laterality Date  . Knee arthroscopy     History reviewed. No pertinent family history. History  Substance Use Topics  . Smoking status: Passive Smoke Exposure - Never Smoker  . Smokeless tobacco: Not on file  . Alcohol Use: No   OB History    No data available     Review of Systems  All other systems reviewed and are negative  Allergies  Erythromycin; Neosporin; and Penicillins  Home Medications   Prior to Admission medications   Medication Sig Start Date End Date Taking? Authorizing Provider  azelastine (ASTELIN) 0.1 % nasal spray Place 1-2 sprays into both nostrils 2 (two) times daily.  04/18/14  Yes Historical Provider, MD  Calcium-Magnesium-Zinc (CALCIUM-MAGNESUIUM-ZINC PO) Take 1 tablet by mouth daily.    Yes Historical Provider, MD  Cholecalciferol (VITAMIN D PO) Take 1 tablet by mouth daily.    Yes Historical Provider, MD  fluticasone (FLONASE) 50 MCG/ACT nasal spray Place 1 spray into both nostrils daily.   Yes Historical Provider, MD  levothyroxine (SYNTHROID, LEVOTHROID) 88 MCG tablet Take 88 mcg by mouth daily before breakfast.  04/12/14  Yes Historical Provider, MD  loratadine (CLARITIN) 10 MG tablet Take 10 mg by mouth daily.   Yes Historical Provider, MD  lovastatin (MEVACOR) 20 MG tablet Take 20 mg by mouth daily at 6 PM.  04/12/14  Yes  Historical Provider, MD  mometasone-formoterol (DULERA) 100-5 MCG/ACT AERO Inhale 2 puffs into the lungs 2 (two) times daily.   Yes Historical Provider, MD  montelukast (SINGULAIR) 10 MG tablet Take 10 mg by mouth at bedtime.  04/12/14  Yes Historical Provider, MD  Omega-3 Fatty Acids (FISH OIL PO) Take 1 capsule by mouth daily.    Yes Historical Provider, MD  omeprazole (PRILOSEC) 40 MG capsule Take 40 mg by mouth 2 (two) times daily.  04/18/14  Yes Historical Provider, MD  OVER THE COUNTER MEDICATION Take 1 capsule by mouth 3 (three) times daily. Stool softener   Yes Historical Provider, MD  carvedilol (COREG) 3.125 MG tablet Take 1 tablet (3.125 mg total) by mouth 2 (two) times daily with a meal. 05/23/14   Eileen Stanford, PA-C  PREMARIN vaginal cream  03/07/14   Historical Provider, MD  sulfamethoxazole-trimethoprim (BACTRIM DS,SEPTRA DS) 800-160 MG per tablet Take 1 tablet by mouth 2 (two) times daily. Finished 7 day course: 05/15/14-05/22/14 05/15/14   Historical Provider, MD   BP 125/55 mmHg  Pulse 64  Temp(Src) 98.2 F (36.8 C) (Oral)  Resp 18  Ht 5\' 2"  (1.575 m)  Wt 145 lb 11.2 oz (66.089 kg)  BMI 26.64 kg/m2  SpO2 95% Physical Exam Physical Exam  Nursing note and vitals reviewed. Constitutional: She is oriented to person, place, and time. She appears well-developed and well-nourished. No  distress.  HENT:  Head: Normocephalic and atraumatic.  Eyes: Pupils are equal, round, and reactive to light.  Neck: Normal range of motion.  Cardiovascular: Normal rate and intact distal pulses.   Pulmonary/Chest: No respiratory distress.  Abdominal: Normal appearance. She exhibits no distension.  Musculoskeletal: Normal range of motion.  Neurological: She is alert and oriented to person, place, and time. No cranial nerve deficit.  Skin: Skin is warm and dry. No rash noted.  Psychiatric: She has a normal mood and affect. Her behavior is normal.   ED Course  Procedures (including  critical care time) Labs Review Labs Reviewed  CBC - Abnormal; Notable for the following:    WBC 12.2 (*)    All other components within normal limits  COMPREHENSIVE METABOLIC PANEL - Abnormal; Notable for the following:    Total Bilirubin 0.2 (*)    GFR calc non Af Amer 61 (*)    GFR calc Af Amer 71 (*)    All other components within normal limits  COMPREHENSIVE METABOLIC PANEL - Abnormal; Notable for the following:    Creatinine, Ser 1.15 (*)    Albumin 3.1 (*)    GFR calc non Af Amer 43 (*)    GFR calc Af Amer 50 (*)    All other components within normal limits  LIPID PANEL - Abnormal; Notable for the following:    LDL Cholesterol 104 (*)    All other components within normal limits  HEMOGLOBIN A1C - Abnormal; Notable for the following:    Hgb A1c MFr Bld 6.3 (*)    Mean Plasma Glucose 134 (*)    All other components within normal limits  CBC - Abnormal; Notable for the following:    Hemoglobin 11.5 (*)    All other components within normal limits  TROPONIN I  TROPONIN I  TROPONIN I  PROTIME-INR  TSH  PRO B NATRIURETIC PEPTIDE  PROTIME-INR  I-STAT TROPOININ, ED    Imaging Review No results found.   EKG Interpretation   Date/Time:  Monday May 22 2014 12:21:29 EST Ventricular Rate:  81 PR Interval:  195 QRS Duration: 129 QT Interval:  462 QTC Calculation: 536 R Axis:   -64 Text Interpretation:  Sinus rhythm Multiform ventricular premature  complexes Left bundle branch block No previous tracing Abnormal ekg  Confirmed by Audie Pinto  MD, Babak Lucus ([id number]) on 05/22/2014 12:43:59 PM     Cardiology consulted.  Patient admitted to hospital. MDM   Final diagnoses:  Chest pain         Dot Lanes, MD 05/29/14 [phone]

## 2014-05-23 ENCOUNTER — Other Ambulatory Visit: Payer: Self-pay | Admitting: Physician Assistant

## 2014-05-23 ENCOUNTER — Encounter (HOSPITAL_COMMUNITY): Payer: Self-pay | Admitting: *Deleted

## 2014-05-23 DIAGNOSIS — R079 Chest pain, unspecified: Secondary | ICD-10-CM

## 2014-05-23 DIAGNOSIS — I10 Essential (primary) hypertension: Secondary | ICD-10-CM

## 2014-05-23 DIAGNOSIS — I493 Ventricular premature depolarization: Secondary | ICD-10-CM | POA: Diagnosis present

## 2014-05-23 DIAGNOSIS — K219 Gastro-esophageal reflux disease without esophagitis: Secondary | ICD-10-CM | POA: Diagnosis not present

## 2014-05-23 DIAGNOSIS — E780 Pure hypercholesterolemia: Secondary | ICD-10-CM | POA: Diagnosis not present

## 2014-05-23 DIAGNOSIS — J45909 Unspecified asthma, uncomplicated: Secondary | ICD-10-CM | POA: Diagnosis not present

## 2014-05-23 DIAGNOSIS — I447 Left bundle-branch block, unspecified: Secondary | ICD-10-CM | POA: Diagnosis not present

## 2014-05-23 DIAGNOSIS — R072 Precordial pain: Secondary | ICD-10-CM

## 2014-05-23 DIAGNOSIS — E039 Hypothyroidism, unspecified: Secondary | ICD-10-CM | POA: Diagnosis present

## 2014-05-23 DIAGNOSIS — E785 Hyperlipidemia, unspecified: Secondary | ICD-10-CM | POA: Diagnosis present

## 2014-05-23 LAB — HEMOGLOBIN A1C
HEMOGLOBIN A1C: 6.3 % — AB (ref <5.7)
Mean Plasma Glucose: 134 mg/dL — ABNORMAL HIGH (ref <117)

## 2014-05-23 LAB — COMPREHENSIVE METABOLIC PANEL
ALK PHOS: 49 U/L (ref 39–117)
ALT: 16 U/L (ref 0–35)
AST: 21 U/L (ref 0–37)
Albumin: 3.1 g/dL — ABNORMAL LOW (ref 3.5–5.2)
Anion gap: 6 (ref 5–15)
BUN: 18 mg/dL (ref 6–23)
CALCIUM: 8.7 mg/dL (ref 8.4–10.5)
CO2: 25 mmol/L (ref 19–32)
Chloride: 107 mEq/L (ref 96–112)
Creatinine, Ser: 1.15 mg/dL — ABNORMAL HIGH (ref 0.50–1.10)
GFR calc non Af Amer: 43 mL/min — ABNORMAL LOW (ref >90)
GFR, EST AFRICAN AMERICAN: 50 mL/min — AB (ref >90)
GLUCOSE: 88 mg/dL (ref 70–99)
Potassium: 4 mmol/L (ref 3.5–5.1)
Sodium: 138 mmol/L (ref 135–145)
TOTAL PROTEIN: 6.2 g/dL (ref 6.0–8.3)
Total Bilirubin: 0.4 mg/dL (ref 0.3–1.2)

## 2014-05-23 LAB — CBC
HCT: 36.9 % (ref 36.0–46.0)
Hemoglobin: 11.5 g/dL — ABNORMAL LOW (ref 12.0–15.0)
MCH: 29 pg (ref 26.0–34.0)
MCHC: 31.2 g/dL (ref 30.0–36.0)
MCV: 92.9 fL (ref 78.0–100.0)
Platelets: 226 10*3/uL (ref 150–400)
RBC: 3.97 MIL/uL (ref 3.87–5.11)
RDW: 14.7 % (ref 11.5–15.5)
WBC: 10.1 10*3/uL (ref 4.0–10.5)

## 2014-05-23 LAB — LIPID PANEL
CHOLESTEROL: 173 mg/dL (ref 0–200)
HDL: 49 mg/dL (ref >39)
LDL Cholesterol: 104 mg/dL — ABNORMAL HIGH (ref 0–99)
Total CHOL/HDL Ratio: 3.5 RATIO
Triglycerides: 98 mg/dL (ref <150)
VLDL: 20 mg/dL (ref 0–40)

## 2014-05-23 LAB — PROTIME-INR
INR: 1.03 (ref 0.00–1.49)
PROTHROMBIN TIME: 13.6 s (ref 11.6–15.2)

## 2014-05-23 LAB — TROPONIN I
Troponin I: <0.03 ng/mL (ref <0.30)
Troponin I: <0.03 ng/mL (ref <0.031)

## 2014-05-23 MED ORDER — CARVEDILOL 3.125 MG PO TABS: 3.1250 mg | ORAL_TABLET | Freq: Two times a day (BID) | ORAL | Status: DC

## 2014-05-23 NOTE — Discharge Summary (Signed)
Discharge Summary   Patient ID: Samantha [name] MRN: [id number], DOB/AGE: [birth year] 80 y.o. Admit date: 05/22/2014 D/C date:     05/23/2014  Primary Cardiologist: New ([place]ives in [place]- will follow up with [name])  Principal Problem:   Chest pain Active Problems:   Left bundle branch block   PVC's (premature ventricular contractions)   High cholesterol   GERD (gastroesophageal reflux disease)   Hypothyroidism   Ventricular ectopy    Admission Dates: 05/22/14-05/23/14 Discharge Diagnosis: chest pain, atypical. She ruled out for MI and will be set up for outpatient stress test.   HPI: Samantha [name] is a 80 y.o. female with a history of HLD, GERD, hypothyroidism, asthma and no prior cardiac history who presented to [facility] today with chest pain. ECG in ED revealed presumed new LBBB. No previous tracing to compare.   The patient is generally very healthy and was in her usual state of health until about a week and a half ago when she had a cold and bronchitis. Since that time she's noted some mild central chest discomfort that she cannot qualify as pressure, pain, squeezing, or tightness. She just states that it "does not feel right". She went to her primary care doctor, Dr. Felipa Eth, today for follow-up on her recent diagnosis of bronchitis and mentioned that she had some chest discomfort. Apparently a ECG was done that revealed a presumed new LBBB and ventricular trigeminy. She was sent to the ED for further evaluation.  She does have some shortness of breath although not necessarily associated with the chest discomfort. She also has had some nausea a couple times this week in the morning; however, again not associated with chest discomfort. The chest discomfort comes and goes and is not related to exertion. There is no radiation. She is generally pretty active and is able to do what she likes without DOE or chest pain. SHe does note some SOB when climbing stairs. [place] used [place]  [place] has not for the last year or so due to ligament injury in her knee. Her mother died of a heart attack in her 89s and her sister died at the age of 16 from congestive heart failure. She did have a [place] remotely and cannot tell me when that was. She does not smoke but does have a long history of secondhand smoke exposure.   Hospital Course  Chest pain- with some RFS including HLD, age, sex, family hx. However, chest pain atypical.  -- Troponin neg x3 -- LBBB on ECG; unknown if this is new or not as there are no previous tracings.  -- 2D ECHO today with abnormal septal motion inferobasal hypokinesis; otherwise normal LV systolic function with EF 50-55%.  -- Okay to be discharged home today (patient anxious to leave and be with family) with outpatient stress test.  Ventricular ectopy -- Telemetry with frequent ventricular ectopy but ventricular bigeminy and trigeminy and couplets. This is asymptomatic -- Will start coreg 3.125 mg BID  Hypothyroidism- continue synthroid -- TSH normal (3.8)  Asthma- continue home meds.   GERD- continue PPI  HLD- LDL 104. continue statin   Pre-diabetes- HgA1c 6.3 counseled on diet and exercise.   The patient has had an uncomplicated hospital course and is recovering well. She has been seen by Dr. Meda Coffee today and deemed ready for discharge home. All follow-up appointments have been scheduled. Discharge medications are listed below.   Discharge Vitals: Blood pressure 125/55, pulse  64, temperature 98.2 F (36.8 C), temperature source Oral, resp. rate 18, height 5\' 2"  (1.575 m), weight 145 lb 11.2 oz (66.089 kg), SpO2 95 %.  Labs: Lab Results  Component Value Date   WBC 10.1 05/23/2014   HGB 11.5* 05/23/2014   HCT 36.9 05/23/2014   MCV 92.9 05/23/2014   PLT 226 05/23/2014     Recent Labs Lab 05/23/14 0437  NA 138  K 4.0  CL 107  CO2 25  BUN 18  CREATININE 1.15*  CALCIUM 8.7  PROT 6.2    BILITOT 0.4  ALKPHOS 49  ALT 16  AST 21  GLUCOSE 88    Recent Labs  05/22/14 1709 05/22/14 2307 05/23/14 0437  TROPONINI <0.30 <0.03 <0.03   Lab Results  Component Value Date   CHOL 173 05/23/2014   HDL 49 05/23/2014   LDLCALC 104* 05/23/2014   TRIG 98 05/23/2014     Diagnostic Studies/Procedures   Dg Chest 2 View  05/22/2014   CLINICAL DATA:  Deep centralized chest pain for 3 days. Recently diagnosed with bronchitis.  EXAM: CHEST  2 VIEW  COMPARISON:  CT 03/14/2010.  Radiographs 11/05/2009.  FINDINGS: The heart size and mediastinal contours are stable. The lungs are mildly hyperinflated but clear. There is no pleural effusion or pneumothorax. No acute osseous findings are evident.  IMPRESSION: Stable mild chronic obstructive pulmonary disease. No acute cardiopulmonary process.   Electronically Signed   By: Camie Patience M.D.   On: 05/22/2014 13:41    Discharge Medications     Medication List    TAKE these medications        azelastine 0.1 % nasal spray  Commonly known as:  ASTELIN  Place 1-2 sprays into both nostrils 2 (two) times daily.     CALCIUM-MAGNESUIUM-ZINC PO  Take 1 tablet by mouth daily.     carvedilol 3.125 MG tablet  Commonly known as:  COREG  Take 1 tablet (3.125 mg total) by mouth 2 (two) times daily with a meal.     FISH OIL PO  Take 1 capsule by mouth daily.     fluticasone 50 MCG/ACT nasal spray  Commonly known as:  FLONASE  Place 1 spray into both nostrils daily.     levothyroxine 88 MCG tablet  Commonly known as:  SYNTHROID, LEVOTHROID  Take 88 mcg by mouth daily before breakfast.     loratadine 10 MG tablet  Commonly known as:  CLARITIN  Take 10 mg by mouth daily.     lovastatin 20 MG tablet  Commonly known as:  MEVACOR  Take 20 mg by mouth daily at 6 PM.     mometasone-formoterol 100-5 MCG/ACT Aero  Commonly known as:  DULERA  Inhale 2 puffs into the lungs 2 (two) times daily.     montelukast 10 MG tablet  Commonly known  as:  SINGULAIR  Take 10 mg by mouth at bedtime.     omeprazole 40 MG capsule  Commonly known as:  PRILOSEC  Take 40 mg by mouth 2 (two) times daily.     OVER THE COUNTER MEDICATION  Take 1 capsule by mouth 3 (three) times daily. Stool softener     PREMARIN vaginal cream  Generic drug:  conjugated estrogens     sulfamethoxazole-trimethoprim 800-160 MG per tablet  Commonly known as:  BACTRIM DS,SEPTRA DS  Take 1 tablet by mouth 2 (two) times daily. Finished 7 day course: 05/15/14-05/22/14     VITAMIN D PO  Take 1 tablet  by mouth daily.        Disposition   The patient will be discharged in stable condition to home.  Follow-up Information    Follow up with [facility] On 06/05/2014.   Specialty:  Cardiology   Why:  Check in at 9:30am in the Radiology Department at [facility] for a nuclear stress test. Nothing to eat after midnight the night before   Contact information:   [place]      Follow up with [name], NP On 06/09/2014.   Specialty:  Nurse Practitioner   Why:  @ 1:10pm    Contact information:   [place] [address] [phone]         Duration of Discharge Encounter: Greater than 30 minutes including physician and PA time.  Mable Fill R PA-C 05/23/2014, 11:50 AM

## 2014-05-23 NOTE — Progress Notes (Signed)
UR completed 

## 2014-05-23 NOTE — Progress Notes (Signed)
Patient Name: Samantha [name] Date of Encounter: 05/23/2014     Active Problems:   Chest pain   Left bundle branch block   PVC's (premature ventricular contractions)    SUBJECTIVE  No Chest pain or SOB. Feeling well. Anxious to go home.   CURRENT MEDS . aspirin EC  81 mg Oral Daily  . azelastine  1-2 spray Each Nare BID  . fluticasone  1 spray Each Nare Daily  . heparin  5,000 Units Subcutaneous 3 times per day  . levothyroxine  88 mcg Oral QAC breakfast  . loratadine  10 mg Oral Daily  . mometasone-formoterol  2 puff Inhalation BID  . montelukast  10 mg Oral QHS  . omega-3 acid ethyl esters  1 g Oral Daily  . pantoprazole  80 mg Oral Daily  . pravastatin  20 mg Oral q1800  . sodium chloride  3 mL Intravenous Q12H    OBJECTIVE  Filed Vitals:   05/22/14 1730 05/22/14 1827 05/22/14 2141 05/23/14 0439  BP: 142/69 135/79 113/75 125/55  Pulse: 73  85 64  Temp:  97.8 F (36.6 C) 97.8 F (36.6 C) 98.2 F (36.8 C)  TempSrc:  Oral Oral Oral  Resp: 15  18 18   Height:  5\' 2"  (1.575 m)    Weight:  145 lb (65.772 kg)  145 lb 11.2 oz (66.089 kg)  SpO2: 96% 99% 97% 95%    Intake/Output Summary (Last 24 hours) at 05/23/14 0942 Last data filed at 05/23/14 0900  Gross per 24 hour  Intake    240 ml  Output    400 ml  Net   -160 ml   Filed Weights   05/22/14 1223 05/22/14 1827 05/23/14 0439  Weight: 147 lb (66.679 kg) 145 lb (65.772 kg) 145 lb 11.2 oz (66.089 kg)    PHYSICAL EXAM  General: Pleasant, NAD. Neuro: Alert and oriented X 3. Moves all extremities spontaneously. Psych: Normal affect. HEENT:  Normal  Neck: Supple without bruits or JVD. Lungs:  Resp regular and unlabored, CTA. Heart: RRR no s3, s4, or murmurs. Abdomen: Soft, non-tender, non-distended, BS + x 4.  Extremities: No clubbing, cyanosis or edema. DP/PT/Radials 2+ and equal bilaterally.  Accessory Clinical Findings  CBC  Recent Labs  05/22/14 1231 05/23/14 0437  WBC 12.2* 10.1  HGB 12.7  11.5*  HCT 39.3 36.9  MCV 93.1 92.9  PLT 222 185   Basic Metabolic Panel  Recent Labs  05/22/14 1231 05/23/14 0437  NA 139 138  K 4.7 4.0  CL 103 107  CO2 23 25  GLUCOSE 80 88  BUN 15 18  CREATININE 0.86 1.15*  CALCIUM 9.9 8.7   Liver Function Tests  Recent Labs  05/22/14 1231 05/23/14 0437  AST 24 21  ALT 18 16  ALKPHOS 66 49  BILITOT 0.2* 0.4  PROT 7.4 6.2  ALBUMIN 3.8 3.1*   No results for input(s): LIPASE, AMYLASE in the last 72 hours. Cardiac Enzymes  Recent Labs  05/22/14 1709 05/22/14 2307 05/23/14 0437  TROPONINI <0.30 <0.03 <0.03   BNP Invalid input(s): POCBNP D-Dimer No results for input(s): DDIMER in the last 72 hours. Hemoglobin A1C  Recent Labs  05/22/14 1709  HGBA1C 6.3*   Fasting Lipid Panel  Recent Labs  05/23/14 0437  CHOL 173  HDL 49  LDLCALC 104*  TRIG 98  CHOLHDL 3.5   Thyroid Function Tests  Recent Labs  05/22/14 1900  TSH 3.870    TELE  NSR  w/ LBBB and  frequent ventricular ectopy but ventricular bigeminy and trigeminy and couplets.   Radiology/Studies  Dg Chest 2 View  05/22/2014   CLINICAL DATA:  Deep centralized chest pain for 3 days. Recently diagnosed with bronchitis.  EXAM: CHEST  2 VIEW  COMPARISON:  CT 03/14/2010.  Radiographs 11/05/2009.  FINDINGS: The heart size and mediastinal contours are stable. The lungs are mildly hyperinflated but clear. There is no pleural effusion or pneumothorax. No acute osseous findings are evident.  IMPRESSION: Stable mild chronic obstructive pulmonary disease. No acute cardiopulmonary process.   Electronically Signed   By: Camie Patience M.D.   On: 05/22/2014 13:41    2D ECHO: 05/23/2014 Study Conclusions - Left ventricle: Abnormal septal motion inferobasal hypokinesis. The cavity size was normal. Wall thickness was normal. Systolic function was normal. The estimated ejection fraction was in the range of 50% to 55%. - Atrial septum: No defect or patent foramen  ovale was identified.    ASSESSMENT AND PLAN HENA EWALT is a 80 y.o. female with a history of HLD, GERD, hypothyroidism, asthma and no prior cardiac history who presented to [facility] today with chest pain. ECG in ED revealed presumed new LBBB. No previous tracing to compare.    Chest pain- with some RFS including HLD, age, sex, family hx. However, chest pain atypical.  -- Troponin neg x3 -- LBBB on ECG; unknown if this is new or not as there are no previous tracings.  -- 2D ECHO today with abnormal septal motion inferobasal hypokinesis; otherwise normal LV systolic function with EF 50-55%.  -- Telemetry with frequent ventricular ectopy but ventricular bigeminy and trigeminy and couplets. This is asymptomatic -- Likely okay to be discharged home today (patient anxious to leave and be with family) with outpatient stress test.  Hypothyroidism- continue synthroid -- TSH normal (3.8)  Asthma- continue home meds.   GERD- continue PPI  HLD- LDL 104.  continue statin   Pre-diabetes- HgA1c 6.3 counseled on diet and exercise.  Judy Pimple PA-C  Pager [phone]   The patient was seen, examined and discussed with Nell Range, PA-C and I agree with the above.   She is asymptomatic right now, we will schedule an outpatient stress test - Lexiscan (knee pain) and follow up with a physician (the first week of January). She has very frequent PVCs on telemetry, we will start carvedilol 3.125 mg po BID.   Dorothy Spark 05/23/2014

## 2014-05-23 NOTE — Progress Notes (Signed)
Echo Lab  2D Echocardiogram completed.  [place], RDCS 05/23/2014 8:35 AM

## 2014-06-05 ENCOUNTER — Encounter (HOSPITAL_COMMUNITY): Payer: Medicare Other

## 2014-06-05 ENCOUNTER — Inpatient Hospital Stay (HOSPITAL_COMMUNITY): Admit: 2014-06-05 | Payer: Medicare Other

## 2014-06-08 ENCOUNTER — Encounter (HOSPITAL_COMMUNITY)
Admission: RE | Admit: 2014-06-08 | Discharge: 2014-06-08 | Disposition: A | Discharge status: Home / Self Care | Payer: Medicare Other | Source: Ambulatory Visit | Attending: Physician Assistant | Admitting: Physician Assistant

## 2014-06-08 ENCOUNTER — Encounter (HOSPITAL_COMMUNITY): Payer: Self-pay

## 2014-06-08 ENCOUNTER — Ambulatory Visit (HOSPITAL_COMMUNITY)
Admission: RE | Admit: 2014-06-08 | Discharge: 2014-06-08 | Disposition: A | Discharge status: Home / Self Care | Payer: Medicare Other | Source: Ambulatory Visit | Attending: Physician Assistant | Admitting: Physician Assistant

## 2014-06-08 DIAGNOSIS — I2510 Atherosclerotic heart disease of native coronary artery without angina pectoris: Secondary | ICD-10-CM | POA: Diagnosis present

## 2014-06-08 DIAGNOSIS — R079 Chest pain, unspecified: Secondary | ICD-10-CM | POA: Insufficient documentation

## 2014-06-08 DIAGNOSIS — I447 Left bundle-branch block, unspecified: Secondary | ICD-10-CM | POA: Diagnosis present

## 2014-06-08 MED ORDER — TECHNETIUM TC 99M SESTAMIBI - CARDIOLITE
10.0000 | Freq: Once | INTRAVENOUS | Status: AC | PRN
  Administered 2014-06-08: 10 via INTRAVENOUS

## 2014-06-08 MED ORDER — SODIUM CHLORIDE 0.9 % IJ SOLN
INTRAMUSCULAR | Status: AC
  Administered 2014-06-08: 10 mL via INTRAVENOUS
  Filled 2014-06-08: qty 3

## 2014-06-08 MED ORDER — REGADENOSON 0.4 MG/5ML IV SOLN
INTRAVENOUS | Status: AC
Start: 2014-06-08 — End: 2014-06-08
  Administered 2014-06-08: 0.4 mg via INTRAVENOUS
  Filled 2014-06-08: qty 5

## 2014-06-08 MED ORDER — TECHNETIUM TC 99M SESTAMIBI GENERIC - CARDIOLITE
30.0000 | Freq: Once | INTRAVENOUS | Status: AC | PRN
  Administered 2014-06-08: 30 via INTRAVENOUS

## 2014-06-08 MED ORDER — REGADENOSON 0.4 MG/5ML IV SOLN
0.4000 mg | Freq: Once | INTRAVENOUS | Status: AC | PRN
Start: 2014-06-08 — End: 2014-06-08
  Administered 2014-06-08: 0.4 mg via INTRAVENOUS

## 2014-06-08 MED ORDER — SODIUM CHLORIDE 0.9 % IJ SOLN
10.0000 mL | INTRAMUSCULAR | Status: DC | PRN
  Administered 2014-06-08: 10 mL via INTRAVENOUS
  Filled 2014-06-08: qty 10

## 2014-06-08 NOTE — Progress Notes (Signed)
Stress Lab Nurses Notes - [facility]  VANESSIA BOKHARI 06/08/2014 Reason for doing test: CAD Chest pain & LBBB Type of test: Wille Glaser Nurse performing test: Gerrit Halls, RN Nuclear Medicine Tech: Melburn Hake Echo Tech: Not Applicable MD performing test: Branch/K.Purcell Nails NP Family MD: [place] Test explained and consent signed: Yes.   IV started: Saline lock flushed, No redness or edema and Saline lock started in radiology Symptoms: Stomach discomfort Treatment/Intervention: None Reason test stopped: protocol completed After recovery IV was: Discontinued via X-ray tech and No redness or edema Patient to return to Nuc. Med at : 10:30 Patient discharged: Home Patient's Condition upon discharge was: stable Comments: During test BP 124/61 & HR 90 .  Recovery BP 131/71 & HR 83.  Symptoms resolved in recovery. Geanie Cooley T

## 2014-06-09 ENCOUNTER — Ambulatory Visit (INDEPENDENT_AMBULATORY_CARE_PROVIDER_SITE_OTHER): Payer: Medicare Other | Admitting: Adult Health

## 2014-06-09 ENCOUNTER — Encounter: Payer: Self-pay | Admitting: Adult Health

## 2014-06-09 VITALS — BP 144/84 | HR 83 | Ht 64.0 in | Wt 148.0 lb

## 2014-06-09 DIAGNOSIS — I493 Ventricular premature depolarization: Secondary | ICD-10-CM

## 2014-06-09 DIAGNOSIS — I447 Left bundle-branch block, unspecified: Secondary | ICD-10-CM

## 2014-06-09 DIAGNOSIS — K219 Gastro-esophageal reflux disease without esophagitis: Secondary | ICD-10-CM

## 2014-06-09 NOTE — Assessment & Plan Note (Signed)
Asymptomatic currently. Will hold off on starting carvedilol. She is mildly hypertensive today and I have explained to her that this medication is also for hypertensive control. She does not want to take it at this time. She will follow up with her PCP, Dr. Felipa Eth to discuss further.

## 2014-06-09 NOTE — Patient Instructions (Signed)
Your physician wants you to follow-up in: 1 year  You will receive a reminder letter in the mail two months in advance. If you don't receive a letter, please call our office to schedule the follow-up appointment.   STOP Coreg    Thank you for choosing [facility] !

## 2014-06-09 NOTE — Assessment & Plan Note (Signed)
She wishes to change PPI. I will defer to PCP for this. I have given her a copy of the [facility] list to discuss with PCP on visit next month.

## 2014-06-09 NOTE — Progress Notes (Deleted)
Name: Samantha [name]    DOB: 1929/10/03  Age: 81 y.o.  MR#: [id number]       PCP:  Mathews Argyle, MD      Insurance: Payor: [employer] MEDICARE / Plan: [facility] MEDICARE / Product Type: *No Product type* /   CC:    Chief Complaint  Patient presents with  . Chest Pain    VS Filed Vitals:   06/09/14 1306  BP: 144/84  Pulse: 83  Height: 5\' 4"  (1.626 m)  Weight: 148 lb (67.132 kg)    Weights Current Weight  06/09/14 148 lb (67.132 kg)  05/23/14 145 lb 11.2 oz (66.089 kg)  04/17/10 150 lb (68.04 kg)    Blood Pressure  BP Readings from Last 3 Encounters:  06/09/14 144/84  05/23/14 125/55  04/17/10 120/76     Admit date:  (Not on file) Last encounter with RMR:  Visit date not found   Allergy Erythromycin; Neosporin; and Penicillins  Current Outpatient Prescriptions  Medication Sig Dispense Refill  . azelastine (ASTELIN) 0.1 % nasal spray Place 1-2 sprays into both nostrils 2 (two) times daily.     . Calcium-Magnesium-Zinc (CALCIUM-MAGNESUIUM-ZINC PO) Take 1 tablet by mouth daily.     . carvedilol (COREG) 3.125 MG tablet Take 1 tablet (3.125 mg total) by mouth 2 (two) times daily with a meal. (Patient not taking: Reported on 06/09/2014) 60 tablet 11  . Cholecalciferol (VITAMIN D PO) Take 1 tablet by mouth daily.     . fluticasone (FLONASE) 50 MCG/ACT nasal spray Place 1 spray into both nostrils daily.    Marland Kitchen levothyroxine (SYNTHROID, LEVOTHROID) 88 MCG tablet Take 88 mcg by mouth daily before breakfast.     . loratadine (CLARITIN) 10 MG tablet Take 10 mg by mouth daily.    Marland Kitchen lovastatin (MEVACOR) 20 MG tablet Take 20 mg by mouth daily at 6 PM.     . mometasone-formoterol (DULERA) 100-5 MCG/ACT AERO Inhale 2 puffs into the lungs 2 (two) times daily.    . montelukast (SINGULAIR) 10 MG tablet Take 10 mg by mouth at bedtime.     . Omega-3 Fatty Acids (FISH OIL PO) Take 1 capsule by mouth daily.     Marland Kitchen omeprazole (PRILOSEC) 40 MG capsule Take 40 mg by mouth 2 (two) times daily.      Marland Kitchen OVER THE COUNTER MEDICATION Take 1 capsule by mouth 3 (three) times daily. Stool softener    . PREMARIN vaginal cream      No current facility-administered medications for this visit.    Discontinued Meds:    Medications Discontinued During This Encounter  Medication Reason  . sulfamethoxazole-trimethoprim (BACTRIM DS,SEPTRA DS) 800-160 MG per tablet Error    Patient Active Problem List   Diagnosis Date Noted  . High cholesterol   . GERD (gastroesophageal reflux disease)   . Hypothyroidism   . Ventricular ectopy   . Chest pain 05/22/2014  . Left bundle branch block 05/22/2014  . PVC's (premature ventricular contractions) 05/22/2014  . INTRINSIC ASTHMA, UNSPECIFIED 02/14/2010  . HOARSENESS 02/14/2010  . CHEST PAIN, ATYPICAL 02/14/2010  . ALLERGIC RHINITIS 02/13/2010  . G E R D 02/13/2010    LABS    Component Value Date/Time   NA 138 05/23/2014 0437   NA 139 05/22/2014 1231   NA 139 03/12/2010 1257   K 4.0 05/23/2014 0437   K 4.7 05/22/2014 1231   K 4.4 03/12/2010 1257   CL 107 05/23/2014 0437   CL 103 05/22/2014 1231  CL 104 03/12/2010 1257   CO2 25 05/23/2014 0437   CO2 23 05/22/2014 1231   CO2 28 03/12/2010 1257   GLUCOSE 88 05/23/2014 0437   GLUCOSE 80 05/22/2014 1231   GLUCOSE 85 03/12/2010 1257   BUN 18 05/23/2014 0437   BUN 15 05/22/2014 1231   BUN 11 03/12/2010 1257   CREATININE 1.15* 05/23/2014 0437   CREATININE 0.86 05/22/2014 1231   CREATININE 0.8 03/12/2010 1257   CALCIUM 8.7 05/23/2014 0437   CALCIUM 9.9 05/22/2014 1231   CALCIUM 9.1 03/12/2010 1257   GFRNONAA 43* 05/23/2014 0437   GFRNONAA 61* 05/22/2014 1231   GFRNONAA 74.74 03/12/2010 1257   GFRAA 50* 05/23/2014 0437   GFRAA 71* 05/22/2014 1231   CMP     Component Value Date/Time   NA 138 05/23/2014 0437   K 4.0 05/23/2014 0437   CL 107 05/23/2014 0437   CO2 25 05/23/2014 0437   GLUCOSE 88 05/23/2014 0437   BUN 18 05/23/2014 0437   CREATININE 1.15* 05/23/2014 0437   CALCIUM  8.7 05/23/2014 0437   PROT 6.2 05/23/2014 0437   ALBUMIN 3.1* 05/23/2014 0437   AST 21 05/23/2014 0437   ALT 16 05/23/2014 0437   ALKPHOS 49 05/23/2014 0437   BILITOT 0.4 05/23/2014 0437   GFRNONAA 43* 05/23/2014 0437   GFRAA 50* 05/23/2014 0437       Component Value Date/Time   WBC 10.1 05/23/2014 0437   WBC 12.2* 05/22/2014 1231   HGB 11.5* 05/23/2014 0437   HGB 12.7 05/22/2014 1231   HCT 36.9 05/23/2014 0437   HCT 39.3 05/22/2014 1231   MCV 92.9 05/23/2014 0437   MCV 93.1 05/22/2014 1231    Lipid Panel     Component Value Date/Time   CHOL 173 05/23/2014 0437   TRIG 98 05/23/2014 0437   HDL 49 05/23/2014 0437   CHOLHDL 3.5 05/23/2014 0437   VLDL 20 05/23/2014 0437   LDLCALC 104* 05/23/2014 0437    ABG No results found for: PHART, PCO2ART, PO2ART, HCO3, TCO2, ACIDBASEDEF, O2SAT   Lab Results  Component Value Date   TSH 3.870 05/22/2014   BNP (last 3 results)  Recent Labs  05/22/14 1900  PROBNP 442.8   Cardiac Panel (last 3 results) No results for input(s): CKTOTAL, CKMB, TROPONINI, RELINDX in the last 72 hours.  Iron/TIBC/Ferritin/ %Sat No results found for: IRON, TIBC, FERRITIN, IRONPCTSAT   EKG Orders placed or performed during the hospital encounter of 05/22/14  . EKG 12-Lead  . EKG 12-Lead  . ED EKG  . ED EKG  . EKG 12-Lead     Prior Assessment and Plan Problem List as of 06/09/2014      Cardiovascular and Mediastinum   Left bundle branch block   PVC's (premature ventricular contractions)   Ventricular ectopy     Respiratory   ALLERGIC RHINITIS   INTRINSIC ASTHMA, UNSPECIFIED     Digestive   G E R D   GERD (gastroesophageal reflux disease)     Endocrine   Hypothyroidism     Other   HOARSENESS   CHEST PAIN, ATYPICAL   Chest pain   High cholesterol       Imaging: Dg Chest 2 View  05/22/2014   CLINICAL DATA:  Deep centralized chest pain for 3 days. Recently diagnosed with bronchitis.  EXAM: CHEST  2 VIEW  COMPARISON:  CT  03/14/2010.  Radiographs 11/05/2009.  FINDINGS: The heart size and mediastinal contours are stable. The lungs are mildly hyperinflated but  clear. There is no pleural effusion or pneumothorax. No acute osseous findings are evident.  IMPRESSION: Stable mild chronic obstructive pulmonary disease. No acute cardiopulmonary process.   Electronically Signed   By: Camie Patience M.D.   On: 05/22/2014 13:41   Nm Myocar Multi W/spect W/wall Motion / Ef  06/08/2014   CLINICAL DATA:  81 year old female with no known history of coronary artery disease referred for chest pain.  EXAM: MYOCARDIAL IMAGING WITH SPECT (REST AND PHARMACOLOGIC-STRESS)  GATED LEFT VENTRICULAR WALL MOTION STUDY  LEFT VENTRICULAR EJECTION FRACTION  TECHNIQUE: Standard myocardial SPECT imaging was performed after resting intravenous injection of 10 mCi Tc-34m sestamibi. Subsequently, intravenous infusion of Lexiscan was performed under the supervision of the Cardiology staff. At peak effect of the drug, 30 mCi Tc-33m sestamibi was injected intravenously and standard myocardial SPECT imaging was performed. Quantitative gated imaging was also performed to evaluate left ventricular wall motion, and estimate left ventricular ejection fraction.  COMPARISON:  None.  FINDINGS: Pharmacological stress  Baseline EKG shows sinus rhythm with nonspecific conduction delay. After injection heart rate increased from 73 beats per min up to 95 beats per min and blood pressure decreased from 141/85 down to 124/61. The test was stopped after injection was complete, the patient did not experience any chest pain. Post-injection EKG showed no specific ischemic changes and no significant arrhythmias, there were some occasional PVCs noted.  Perfusion: There is a small mild intensity anterior wall defect seen in the resting images. A similar less intense defect is seen in the post-injection images. The anterior wall has normal wall motion findings are most consistent with breast  attenuation. There are no other myocardial perfusion defects.  Wall Motion: Wall motion is consistent with bundle branch block. No left ventricular dilation.  Left Ventricular Ejection Fraction: 62 %  End diastolic volume 66 ml  End systolic volume 25 ml  IMPRESSION: 1. No reversible ischemia or infarction.  2. Wall motion is consistent with bundle branch block.  3. Left ventricular ejection fraction 62%  4. Low-risk stress test findings*.  *2012 Appropriate Use Criteria for Coronary Revascularization Focused Update: J Am Coll Cardiol. 3662;94(7):654-650. http://content.airportbarriers.com.aspx?articleid=[email or web address]   Electronically Signed   By: Carlyle Dolly   On: 06/08/2014 11:29

## 2014-06-09 NOTE — Progress Notes (Signed)
HPI: [name] is an 81 year old female patient of Dr. Ron Parker that we are seeing post hospitalization after admission for chest pain.  She has a history of hyperlipidemia, GERD, hypothyroidism, asthma, but no prior cardiac history.  She is to have a new left bundle branch block on EKG tracing on admission.  She did have some mild shortness of breath, but was not necessarily associated with chest discomfort.  She has nausea.  During hospitalization.  She was ruled out for myocardial infarction.  Echo revealed normal.  Systolic function, EF of 64-33%.  She was also noted to have frequent ventricular ectopy with bigeminy and trigeminy on telemetry during hospitalization.  She was started on carvedilol 3.125 mg twice a day.    She had a subsequent nuclear medicine Myoview, which was completed on 06/08/2014.  This revealed no evidence of reversible ischemia or infarction, wall motion, consistent with bundle branch block.  LVEF of 62%.  Overall low risk finding.   She has not been taking the carvedilol as she did not feel comfortable adding another medication to her regimen. She is asymptomatic concerning palpitations, racing or irregular HR.   Allergies  Allergen Reactions  . Erythromycin Diarrhea  . Neosporin [Neomycin-Bacitracin Zn-Polymyx] Rash  . Penicillins Rash    Current Outpatient Prescriptions  Medication Sig Dispense Refill  . azelastine (ASTELIN) 0.1 % nasal spray Place 1-2 sprays into both nostrils 2 (two) times daily.     . Calcium-Magnesium-Zinc (CALCIUM-MAGNESUIUM-ZINC PO) Take 1 tablet by mouth daily.     . carvedilol (COREG) 3.125 MG tablet Take 1 tablet (3.125 mg total) by mouth 2 (two) times daily with a meal. (Patient not taking: Reported on 06/09/2014) 60 tablet 11  . Cholecalciferol (VITAMIN D PO) Take 1 tablet by mouth daily.     . fluticasone (FLONASE) 50 MCG/ACT nasal spray Place 1 spray into both nostrils daily.    Marland Kitchen levothyroxine (SYNTHROID, LEVOTHROID) 88 MCG tablet  Take 88 mcg by mouth daily before breakfast.     . loratadine (CLARITIN) 10 MG tablet Take 10 mg by mouth daily.    Marland Kitchen lovastatin (MEVACOR) 20 MG tablet Take 20 mg by mouth daily at 6 PM.     . mometasone-formoterol (DULERA) 100-5 MCG/ACT AERO Inhale 2 puffs into the lungs 2 (two) times daily.    . montelukast (SINGULAIR) 10 MG tablet Take 10 mg by mouth at bedtime.     . Omega-3 Fatty Acids (FISH OIL PO) Take 1 capsule by mouth daily.     Marland Kitchen omeprazole (PRILOSEC) 40 MG capsule Take 40 mg by mouth 2 (two) times daily.     Marland Kitchen OVER THE COUNTER MEDICATION Take 1 capsule by mouth 3 (three) times daily. Stool softener    . PREMARIN vaginal cream      No current facility-administered medications for this visit.    Past Medical History  Diagnosis Date  . Asthma   . High cholesterol   . GERD (gastroesophageal reflux disease)   . Hypothyroidism   . Arthritis   . Ventricular ectopy   . LBBB (left bundle branch block)     Past Surgical History  Procedure Laterality Date  . Knee arthroscopy      ROS: Complete review of systems performed and found to be negative unless outlined above  PHYSICAL EXAM BP 144/84 mmHg  Pulse 83  Ht 5\' 4"  (1.626 m)  Wt 148 lb (67.132 kg)  BMI 25.39 kg/m2 General: Well developed, well nourished, in no acute  distress Head: Eyes PERRLA, No xanthomas.   Normal cephalic and atramatic  Lungs: Clear bilaterally to auscultation and percussion. Heart: HRRR S1 S2, without MRG.  Pulses are 2+ & equal.            No carotid bruit. No JVD.  No abdominal bruits. No femoral bruits. Abdomen: Bowel sounds are positive, abdomen soft and non-tender without masses or Hernia's noted. Msk:  Back normal, normal gait. Normal strength and tone for age. Extremities: No clubbing, cyanosis or edema.  DP +1 Neuro: Alert and oriented X 3. Psych:  Good affect, responds appropriately   ASSESSMENT AND PLAN

## 2014-06-09 NOTE — Assessment & Plan Note (Signed)
I have spent 25 minutes with this patient going over all of her medications and listing the indications. I have also discussed the stress test results with her giving reassurance. She is very reluctant to take carvedilol. As she is asymptomatic, I will hold off on this. She is tearful stating that she does not want to add another medication to her regimen.

## 2014-09-08 ENCOUNTER — Emergency Department (HOSPITAL_COMMUNITY)
Admission: EM | Admit: 2014-09-08 | Discharge: 2014-09-08 | Disposition: A | Discharge status: Other Acute Inpatient Hospital | Payer: Medicare Other | Source: Home / Self Care | Attending: Family Medicine | Admitting: Family Medicine

## 2014-09-08 ENCOUNTER — Emergency Department (HOSPITAL_COMMUNITY): Payer: Medicare Other

## 2014-09-08 ENCOUNTER — Emergency Department (HOSPITAL_COMMUNITY)
Admission: EM | Admit: 2014-09-08 | Discharge: 2014-09-08 | Disposition: A | Discharge status: Home / Self Care | Payer: Medicare Other | Attending: Emergency Medicine | Admitting: Emergency Medicine

## 2014-09-08 ENCOUNTER — Encounter (HOSPITAL_COMMUNITY): Payer: Self-pay | Admitting: Family Medicine

## 2014-09-08 ENCOUNTER — Encounter (HOSPITAL_COMMUNITY): Payer: Self-pay

## 2014-09-08 DIAGNOSIS — S81011A Laceration without foreign body, right knee, initial encounter: Secondary | ICD-10-CM

## 2014-09-08 DIAGNOSIS — Z79899 Other long term (current) drug therapy: Secondary | ICD-10-CM | POA: Insufficient documentation

## 2014-09-08 DIAGNOSIS — E039 Hypothyroidism, unspecified: Secondary | ICD-10-CM | POA: Diagnosis not present

## 2014-09-08 DIAGNOSIS — Y92481 Parking lot as the place of occurrence of the external cause: Secondary | ICD-10-CM | POA: Diagnosis not present

## 2014-09-08 DIAGNOSIS — Z7951 Long term (current) use of inhaled steroids: Secondary | ICD-10-CM | POA: Diagnosis not present

## 2014-09-08 DIAGNOSIS — S0990XA Unspecified injury of head, initial encounter: Secondary | ICD-10-CM

## 2014-09-08 DIAGNOSIS — S60512A Abrasion of left hand, initial encounter: Secondary | ICD-10-CM | POA: Insufficient documentation

## 2014-09-08 DIAGNOSIS — M1990 Unspecified osteoarthritis, unspecified site: Secondary | ICD-10-CM | POA: Insufficient documentation

## 2014-09-08 DIAGNOSIS — Z880 Allergy status to penicillin: Secondary | ICD-10-CM | POA: Diagnosis not present

## 2014-09-08 DIAGNOSIS — S0121XA Laceration without foreign body of nose, initial encounter: Secondary | ICD-10-CM

## 2014-09-08 DIAGNOSIS — W1809XA Striking against other object with subsequent fall, initial encounter: Secondary | ICD-10-CM | POA: Insufficient documentation

## 2014-09-08 DIAGNOSIS — Y9389 Activity, other specified: Secondary | ICD-10-CM | POA: Insufficient documentation

## 2014-09-08 DIAGNOSIS — K219 Gastro-esophageal reflux disease without esophagitis: Secondary | ICD-10-CM | POA: Insufficient documentation

## 2014-09-08 DIAGNOSIS — Z8679 Personal history of other diseases of the circulatory system: Secondary | ICD-10-CM | POA: Insufficient documentation

## 2014-09-08 DIAGNOSIS — J45909 Unspecified asthma, uncomplicated: Secondary | ICD-10-CM | POA: Diagnosis not present

## 2014-09-08 DIAGNOSIS — E780 Pure hypercholesterolemia: Secondary | ICD-10-CM | POA: Diagnosis not present

## 2014-09-08 DIAGNOSIS — S022XXA Fracture of nasal bones, initial encounter for closed fracture: Secondary | ICD-10-CM | POA: Diagnosis not present

## 2014-09-08 DIAGNOSIS — Y998 Other external cause status: Secondary | ICD-10-CM | POA: Insufficient documentation

## 2014-09-08 MED ORDER — LIDOCAINE HCL 2 % IJ SOLN
10.0000 mL | Freq: Once | INTRAMUSCULAR | Status: DC
  Filled 2014-09-08: qty 20

## 2014-09-08 MED ORDER — OXYMETAZOLINE HCL 0.05 % NA SOLN
2.0000 | Freq: Once | NASAL | Status: AC
Start: 2014-09-08 — End: 2014-09-08
  Administered 2014-09-08: 2 via NASAL
  Filled 2014-09-08: qty 15

## 2014-09-08 MED ORDER — HYDROCODONE-ACETAMINOPHEN 5-325 MG PO TABS: 1.0000 | ORAL_TABLET | Freq: Four times a day (QID) | ORAL | Status: DC | PRN

## 2014-09-08 NOTE — Discharge Instructions (Signed)
Keep wounds clean. You can wash with soap and water. Bacitracin twice a day. Continue to ice on and off for 20 min at a time several times a day. Apply  Bacitracin ointment on all of the abrasions and lacerations twice a day. Dressing to the knee daily. Watch for signs of infection. Use afrin in the nose twice a day, two sprays in each nostril. Follow up with your doctor in 3 days and again in 1 week for suture removal. Return if any problems or signs of infection.   Laceration Care, Adult A laceration is a cut or lesion that goes through all layers of the skin and into the tissue just beneath the skin. TREATMENT  Some lacerations may not require closure. Some lacerations may not be able to be closed due to an increased risk of infection. It is important to see your caregiver as soon as possible after an injury to minimize the risk of infection and maximize the opportunity for successful closure. If closure is appropriate, pain medicines may be given, if needed. The wound will be cleaned to help prevent infection. Your caregiver will use stitches (sutures), staples, wound glue (adhesive), or skin adhesive strips to repair the laceration. These tools bring the skin edges together to allow for faster healing and a better cosmetic outcome. However, all wounds will heal with a scar. Once the wound has healed, scarring can be minimized by covering the wound with sunscreen during the day for 1 full year. HOME CARE INSTRUCTIONS  For sutures or staples:  Keep the wound clean and dry.  If you were given a bandage (dressing), you should change it at least once a day. Also, change the dressing if it becomes wet or dirty, or as directed by your caregiver.  Wash the wound with soap and water 2 times a day. Rinse the wound off with water to remove all soap. Pat the wound dry with a clean towel.  After cleaning, apply a thin layer of the antibiotic ointment as recommended by your caregiver. This will help prevent  infection and keep the dressing from sticking.  You may shower as usual after the first 24 hours. Do not soak the wound in water until the sutures are removed.  Only take over-the-counter or prescription medicines for pain, discomfort, or fever as directed by your caregiver.  Get your sutures or staples removed as directed by your caregiver. For skin adhesive strips:  Keep the wound clean and dry.  Do not get the skin adhesive strips wet. You may bathe carefully, using caution to keep the wound dry.  If the wound gets wet, pat it dry with a clean towel.  Skin adhesive strips will fall off on their own. You may trim the strips as the wound heals. Do not remove skin adhesive strips that are still stuck to the wound. They will fall off in time. For wound adhesive:  You may briefly wet your wound in the shower or bath. Do not soak or scrub the wound. Do not swim. Avoid periods of heavy perspiration until the skin adhesive has fallen off on its own. After showering or bathing, gently pat the wound dry with a clean towel.  Do not apply liquid medicine, cream medicine, or ointment medicine to your wound while the skin adhesive is in place. This may loosen the film before your wound is healed.  If a dressing is placed over the wound, be careful not to apply tape directly over the skin adhesive. This  may cause the adhesive to be pulled off before the wound is healed.  Avoid prolonged exposure to sunlight or tanning lamps while the skin adhesive is in place. Exposure to ultraviolet light in the first year will darken the scar.  The skin adhesive will usually remain in place for 5 to 10 days, then naturally fall off the skin. Do not pick at the adhesive film. You may need a tetanus shot if:  You cannot remember when you had your last tetanus shot.  You have never had a tetanus shot. If you get a tetanus shot, your arm may swell, get red, and feel warm to the touch. This is common and not a  problem. If you need a tetanus shot and you choose not to have one, there is a rare chance of getting tetanus. Sickness from tetanus can be serious. SEEK MEDICAL CARE IF:   You have redness, swelling, or increasing pain in the wound.  You see a red line that goes away from the wound.  You have yellowish-white fluid (pus) coming from the wound.  You have a fever.  You notice a bad smell coming from the wound or dressing.  Your wound breaks open before or after sutures have been removed.  You notice something coming out of the wound such as wood or glass.  Your wound is on your hand or foot and you cannot move a finger or toe. SEEK IMMEDIATE MEDICAL CARE IF:   Your pain is not controlled with prescribed medicine.  You have severe swelling around the wound causing pain and numbness or a change in color in your arm, hand, leg, or foot.  Your wound splits open and starts bleeding.  You have worsening numbness, weakness, or loss of function of any joint around or beyond the wound.  You develop painful lumps near the wound or on the skin anywhere on your body. MAKE SURE YOU:   Understand these instructions.  Will watch your condition.  Will get help right away if you are not doing well or get worse. Document Released: 05/19/2005 Document Revised: 08/11/2011 Document Reviewed: 11/12/2010 [facility] Patient Information 2015 [place], [place]. This information is not intended to replace advice given to you by your health care provider. Make sure you discuss any questions you have with your health care provider. Nasal Fracture A nasal fracture is a break or crack in the bones of the nose. A minor break usually heals in a month. You often will receive black eyes from a nasal fracture. This is not a cause for concern. The black eyes will go away over 1 to 2 weeks.  DIAGNOSIS  Your caregiver may want to examine you if you are concerned about a fracture of the nose. X-rays of the nose may not  show a nasal fracture even when one is present. Sometimes your caregiver must wait 1 to 5 days after the injury to re-check the nose for alignment and to take additional X-rays. Sometimes the caregiver must wait until the swelling has gone down. TREATMENT Minor fractures that have caused no deformity often do not require treatment. More serious fractures where bones are displaced may require surgery. This will take place after the swelling is gone. Surgery will stabilize and align the fracture. HOME CARE INSTRUCTIONS   Put ice on the injured area.  Put ice in a plastic bag.  Place a towel between your skin and the bag.  Leave the ice on for 15-20 minutes, 03-04 times a day.  Take medications as directed by your caregiver.  Only take over-the-counter or prescription medicines for pain, discomfort, or fever as directed by your caregiver.  If your nose starts bleeding, squeeze the soft parts of the nose against the center wall while you are sitting in an upright position for 10 minutes.  Contact sports should be avoided for at least 3 to 4 weeks or as directed by your caregiver. SEEK MEDICAL CARE IF:  Your pain increases or becomes severe.  You continue to have nosebleeds.  The shape of your nose does not return to normal within 5 days.  You have pus draining from the nose. SEEK IMMEDIATE MEDICAL CARE IF:   You have bleeding from your nose that does not stop after 20 minutes of pinching the nostrils closed and keeping ice on the nose.  You have clear fluid draining from your nose.  You notice a grape-like swelling on the dividing wall between the nostrils (septum). This is a collection of blood (hematoma) that must be drained to help prevent infection.  You have difficulty moving your eyes.  You have recurrent vomiting. Document Released: 05/16/2000 Document Revised: 08/11/2011 Document Reviewed: 09/02/2010 [facility] Patient Information 2015 [place], [place]. This information is  not intended to replace advice given to you by your health care provider. Make sure you discuss any questions you have with your health care provider.

## 2014-09-08 NOTE — ED Provider Notes (Signed)
CSN: [id number]     Arrival date & time 09/08/14  1316 History   First MD Initiated Contact with Patient 09/08/14 1336    This chart was scribed for non-physician practitioner, Jeannett Senior, [place], working with Blanchie Dessert, MD by Terressa Koyanagi, ED Scribe. This patient was seen in room TR03C/TR03C and the patient's care was started at 1:43 PM.  Chief Complaint  Patient presents with  . Fall  . Epistaxis  . Knee Pain    HPI PCP: Mathews Argyle, MD HPI Comments: Samantha [name] is a 81 y.o. female, with PMH noted below, directed to the Emergency Department by the Urgent Care (per Pt Urgent Care advised her that she needs a  CT due to her age), complaining of a fall sustained earlier today when pt was walking and her foot got stuck on a speedbump causing her to fall forwards landing on her right knee and face. Pt also complains of associated laceration to her her nose, abrasion to the right knee. Pt denies LOC and further denies headache, changes in vision, blood thinners. No dizziness or light headiness prior to fall. Ambulatory with no problems. Pressure applied to the nose for bleeding.     Past Medical History  Diagnosis Date  . Asthma   . High cholesterol   . GERD (gastroesophageal reflux disease)   . Hypothyroidism   . Arthritis   . Ventricular ectopy   . LBBB (left bundle branch block)    Past Surgical History  Procedure Laterality Date  . Knee arthroscopy     No family history on file. History  Substance Use Topics  . Smoking status: Passive Smoke Exposure - Never Smoker  . Smokeless tobacco: Not on file  . Alcohol Use: No   OB History    No data available     Review of Systems  Constitutional: Negative for fever and chills.  HENT: Positive for facial swelling and nosebleeds.   Respiratory: Negative for cough, chest tightness and shortness of breath.   Cardiovascular: Negative for chest pain, palpitations and leg swelling.  Gastrointestinal: Negative for  nausea, vomiting, abdominal pain and diarrhea.  Genitourinary: Negative for dysuria, flank pain and pelvic pain.  Musculoskeletal: Positive for arthralgias. Negative for myalgias, neck pain and neck stiffness.  Skin: Positive for wound. Negative for rash.  Neurological: Negative for dizziness, weakness and headaches.  All other systems reviewed and are negative.     Allergies  Erythromycin; Neosporin; and Penicillins  Home Medications   Prior to Admission medications   Medication Sig Start Date End Date Taking? Authorizing Provider  azelastine (ASTELIN) 0.1 % nasal spray Place 1-2 sprays into both nostrils 2 (two) times daily.  04/18/14   Historical Provider, MD  Calcium-Magnesium-Zinc (CALCIUM-MAGNESUIUM-ZINC PO) Take 1 tablet by mouth daily.     Historical Provider, MD  Cholecalciferol (VITAMIN D PO) Take 1 tablet by mouth daily.     Historical Provider, MD  fluticasone (FLONASE) 50 MCG/ACT nasal spray Place 1 spray into both nostrils daily.    Historical Provider, MD  levothyroxine (SYNTHROID, LEVOTHROID) 88 MCG tablet Take 88 mcg by mouth daily before breakfast.  04/12/14   Historical Provider, MD  loratadine (CLARITIN) 10 MG tablet Take 10 mg by mouth daily.    Historical Provider, MD  lovastatin (MEVACOR) 20 MG tablet Take 20 mg by mouth daily at 6 PM.  04/12/14   Historical Provider, MD  mometasone-formoterol (DULERA) 100-5 MCG/ACT AERO Inhale 2 puffs into the lungs 2 (two) times daily.  Historical Provider, MD  montelukast (SINGULAIR) 10 MG tablet Take 10 mg by mouth at bedtime.  04/12/14   Historical Provider, MD  Omega-3 Fatty Acids (FISH OIL PO) Take 1 capsule by mouth daily.     Historical Provider, MD  omeprazole (PRILOSEC) 40 MG capsule Take 40 mg by mouth 2 (two) times daily.  04/18/14   Historical Provider, MD  OVER THE COUNTER MEDICATION Take 1 capsule by mouth 3 (three) times daily. Stool softener    Historical Provider, MD  PREMARIN vaginal cream  03/07/14    Historical Provider, MD   BP 138/69 mmHg  Pulse 66  Temp(Src) 98.7 F (37.1 C) (Oral)  Resp 16  SpO2 94% Physical Exam  Constitutional: She is oriented to person, place, and time. She appears well-developed and well-nourished. No distress.  HENT:  Head: Normocephalic.  Right Ear: External ear normal.  Left Ear: External ear normal.  Mouth/Throat: Oropharynx is clear and moist. No oropharyngeal exudate.  Epistaxis, active, mild, left nostril. There is a laceration over the top of the nose, 1 cm, actively bleeding. TMs normal bilaterally. Bridge of the nose is swollen, bruised, tender to palpation.  Eyes: Conjunctivae and EOM are normal. Pupils are equal, round, and reactive to light.  Neck: Normal range of motion. Neck supple.  Cardiovascular: Normal rate, regular rhythm and normal heart sounds.   Pulmonary/Chest: Effort normal and breath sounds normal. No respiratory distress. She has no wheezes. She has no rales.  Abdominal: Soft. Bowel sounds are normal. She exhibits no distension. There is no tenderness. There is no rebound.  Musculoskeletal: She exhibits no edema.  Large flap laceration to the right anterior knee. Hemostatic. About 4cm diameter. Pain with knee flexion and extension. Joint is stable, negative anterior-posterior drawer signs. Full ROM of the knee  Neurological: She is alert and oriented to person, place, and time. No cranial nerve deficit. Coordination normal.  Skin: Skin is warm and dry.  Few abrasions to the left hand  Psychiatric: She has a normal mood and affect. Her behavior is normal.  Nursing note and vitals reviewed.   ED Course  Procedures (including critical care time) Labs Review Labs Reviewed - No data to display  Imaging Review Ct Head Wo Contrast  09/08/2014   CLINICAL DATA:  Patient tripped over a speed bump and hit her face on concrete. Laceration to bridge of nose and nose bleeding and bilateral black eyes.  EXAM: CT HEAD WITHOUT CONTRAST  CT  MAXILLOFACIAL WITHOUT CONTRAST  TECHNIQUE: Multidetector CT imaging of the head and maxillofacial structures were performed using the standard protocol without intravenous contrast. Multiplanar CT image reconstructions of the maxillofacial structures were also generated.  COMPARISON:  MRI cervical spine 09/10/2005.  FINDINGS: CT HEAD FINDINGS  Ventricles, cisterns and other CSF spaces are normal. There is no mass, mass effect, shift of midline structures or acute hemorrhage. There is no evidence of acute infarction. There is soft tissue swelling over the nasal bridge. There is a subtle fracture along the distal and left side of the nasal bone. Remaining bony structures are within normal.  CT MAXILLOFACIAL FINDINGS  Examination demonstrates a a slightly displaced distal nasal bone fracture extending towards the left side of the nasal bone. Mild associated superficial soft tissue swelling over the nasal bridge. Remainder of the facial bones are within normal without evidence of additional fractures. Paranasal sinus others are well developed and well aerated. Orbits are normal symmetric. Remaining bones and soft tissues are within normal.  IMPRESSION:  No acute intracranial findings.  Minimally displaced nasal bone fracture.   Electronically Signed   By: Marin Olp M.D.   On: 09/08/2014 15:15   Dg Knee Complete 4 Views Right  09/08/2014   CLINICAL DATA:  Pain and abrasion following fall  EXAM: RIGHT KNEE - COMPLETE 4+ VIEW  COMPARISON:  Right knee MRI December 07, 2010  FINDINGS: Frontal lateral, and bilateral oblique views were obtained. There is no fracture or dislocation. No joint effusion. There is extensive intrameniscal calcification. There is no appreciable joint space narrowing. No erosive change.  IMPRESSION: Extensive meniscal calcification. Meniscal calcification of this nature may be seen with osteoarthritis. However, this finding also may be seen with calcium pyrophosphate deposition disease which may  present clinically as pseudogout.  No fracture or appreciable joint effusion.   Electronically Signed   By: Lowella Grip III M.D.   On: 09/08/2014 14:48   Ct Maxillofacial Wo Cm  09/08/2014   CLINICAL DATA:  Patient tripped over a speed bump and hit her face on concrete. Laceration to bridge of nose and nose bleeding and bilateral black eyes.  EXAM: CT HEAD WITHOUT CONTRAST  CT MAXILLOFACIAL WITHOUT CONTRAST  TECHNIQUE: Multidetector CT imaging of the head and maxillofacial structures were performed using the standard protocol without intravenous contrast. Multiplanar CT image reconstructions of the maxillofacial structures were also generated.  COMPARISON:  MRI cervical spine 09/10/2005.  FINDINGS: CT HEAD FINDINGS  Ventricles, cisterns and other CSF spaces are normal. There is no mass, mass effect, shift of midline structures or acute hemorrhage. There is no evidence of acute infarction. There is soft tissue swelling over the nasal bridge. There is a subtle fracture along the distal and left side of the nasal bone. Remaining bony structures are within normal.  CT MAXILLOFACIAL FINDINGS  Examination demonstrates a a slightly displaced distal nasal bone fracture extending towards the left side of the nasal bone. Mild associated superficial soft tissue swelling over the nasal bridge. Remainder of the facial bones are within normal without evidence of additional fractures. Paranasal sinus others are well developed and well aerated. Orbits are normal symmetric. Remaining bones and soft tissues are within normal.  IMPRESSION: No acute intracranial findings.  Minimally displaced nasal bone fracture.   Electronically Signed   By: Marin Olp M.D.   On: 09/08/2014 15:15     EKG Interpretation None      LACERATION REPAIR Performed by: Jeannett Senior A Authorized by: Jeannett Senior A Consent: Verbal consent obtained. Risks and benefits: risks, benefits and alternatives were discussed Consent  given by: patient Patient identity confirmed: provided demographic data Prepped and Draped in normal sterile fashion Wound explored  Laceration Location: nose  Laceration Length: 2cm  No Foreign Bodies seen or palpated  Anesthesia: local infiltration  Local anesthetic: lidocaine 2% wo epinephrine  Anesthetic total: 2 ml  Irrigation method: syringe Amount of cleaning: standard  Skin closure: prolene 6.0  Number of sutures: 3  Technique: simple interrupted     Patient tolerance: Patient tolerated the procedure well with no immediate complications.  LACERATION REPAIR Performed by: Renold Genta Authorized by: Jeannett Senior A Consent: Verbal consent obtained. Risks and benefits: risks, benefits and alternatives were discussed Consent given by: patient Patient identity confirmed: provided demographic data Prepped and Draped in normal sterile fashion Wound explored  Laceration Location: right anterior knee  Laceration Length: 4cm  No Foreign Bodies seen or palpated  Anesthesia: local infiltration  Local anesthetic: lidocaine 2% wo epinephrine  Anesthetic total: 3 ml  Irrigation method: syringe Amount of cleaning: standard  Skin closure: prolene 5.0  Number of sutures: 8  Technique: simple interrupted  Patient tolerance: Patient tolerated the procedure well with no immediate complications.   MDM   Final diagnoses:  Nasal fracture, closed, initial encounter  Minor head injury, initial encounter  Knee laceration, right, initial encounter  Nasal laceration, initial encounter    Patient is here after mechanical fall in the parking lot, fell onto her nose, abrasion to the left hand, laceration to the right knee. She is ambulatory. Nose is actively bleeding. Will get CT of the head, maxillofacial, x-ray of the right knee. Patient had no loss of consciousness, complaining of mild headache, not anticoagulated. Denies any nausea or vomiting,  visual changes, no neuro deficits.  4:30 PM X-rays are negative. CT showing mildly displaced nasal fracture. Afrin used to stop nosebleed  along with firm pressure. Nasal laceration and knee laceration repaired with sutures. Patient is nontoxic appearing. She is neurovascularly intact. Ambulatory. No distress. I do not think she needs any prophylactic antibiotics at this time. Her tetanus is up-to-date. Will discharge home with topical antibiotic ointment, Tylenol for pain, Norco for severe pain only, follow up with primary care doctor for recheck and for suture removal    Jeannett Senior, PA-C 09/08/14 1634  Blanchie Dessert, MD 09/12/14 1600

## 2014-09-08 NOTE — ED Notes (Signed)
Pt transported to xray via wheel chair

## 2014-09-08 NOTE — ED Notes (Signed)
States she tripped and fell earlier today, landed on knees and face. laceration to right knee and laceration to nose. Denies LOC. Bruising to bilateral orbits

## 2014-09-08 NOTE — ED Notes (Signed)
Knee wound dressed with bacitracin, oil emulsion dressing, gauze, and kling wrap.  Pt instructed on wound care and follow up

## 2014-09-08 NOTE — ED Notes (Addendum)
Pt sts she was walking today and she thinks she tripped over a speed bump on the road. Pt has bruising and lac to nose. sts right knee abrasion and lac. Denies LOC. Denies neck pr head pain. sts aching in nose. Denies blood thinners

## 2014-09-08 NOTE — ED Provider Notes (Signed)
Samantha [name] is a 81 y.o. female who presents to Urgent Care today for fall. Patient tripped and fell today landing on her right knee and face. She suffered a laceration to the tip of her nose and her right knee as well as significant contusion to her face. She feels as though her nose is swollen and tender. She denies any loss of consciousness weakness or numbness. She notes a mild headache. She does not take any blood thinners.   Past Medical History  Diagnosis Date  . Asthma   . High cholesterol   . GERD (gastroesophageal reflux disease)   . Hypothyroidism   . Arthritis   . Ventricular ectopy   . LBBB (left bundle branch block)    Past Surgical History  Procedure Laterality Date  . Knee arthroscopy     History  Substance Use Topics  . Smoking status: Passive Smoke Exposure - Never Smoker  . Smokeless tobacco: Not on file  . Alcohol Use: No   ROS as above Medications: No current facility-administered medications for this encounter.   Current Outpatient Prescriptions  Medication Sig Dispense Refill  . azelastine (ASTELIN) 0.1 % nasal spray Place 1-2 sprays into both nostrils 2 (two) times daily.     . Calcium-Magnesium-Zinc (CALCIUM-MAGNESUIUM-ZINC PO) Take 1 tablet by mouth daily.     . Cholecalciferol (VITAMIN D PO) Take 1 tablet by mouth daily.     . fluticasone (FLONASE) 50 MCG/ACT nasal spray Place 1 spray into both nostrils daily.    Marland Kitchen levothyroxine (SYNTHROID, LEVOTHROID) 88 MCG tablet Take 88 mcg by mouth daily before breakfast.     . loratadine (CLARITIN) 10 MG tablet Take 10 mg by mouth daily.    Marland Kitchen lovastatin (MEVACOR) 20 MG tablet Take 20 mg by mouth daily at 6 PM.     . mometasone-formoterol (DULERA) 100-5 MCG/ACT AERO Inhale 2 puffs into the lungs 2 (two) times daily.    . montelukast (SINGULAIR) 10 MG tablet Take 10 mg by mouth at bedtime.     . Omega-3 Fatty Acids (FISH OIL PO) Take 1 capsule by mouth daily.     Marland Kitchen omeprazole (PRILOSEC) 40 MG capsule Take 40 mg  by mouth 2 (two) times daily.     Marland Kitchen OVER THE COUNTER MEDICATION Take 1 capsule by mouth 3 (three) times daily. Stool softener    . PREMARIN vaginal cream      Allergies  Allergen Reactions  . Erythromycin Diarrhea  . Neosporin [Neomycin-Bacitracin Zn-Polymyx] Rash  . Penicillins Rash     Exam:  BP 150/58 mmHg  Pulse 60  Temp(Src) 97.6 F (36.4 C) (Oral)  Resp 18  SpO2 100% Gen: Well NAD HEENT: EOMI,  MMM nose swollen ecchymosis at the base. Tip of the nose with a 1 cm laceration. Lungs: Normal work of breathing. CTABL Heart: RRR no MRG Abd: NABS, Soft. Nondistended, Nontender Exts: Brisk capillary refill, warm and well perfused.  Right knee 4 cm curvilinear laceration of the right anterior lateral aspect of the knee. Laceration extends to the dermis. No deep structures identified. Neuro: Alert and oriented normal coordination  No results found for this or any previous visit (from the past 24 hour(s)). No results found.  Assessment and Plan: 81 y.o. female with fall and head injury. Patient fell and hit her head badly enough to likely suffer a nasal bone fracture and to suffer laceration to her nose into her knee. Plan to transfer to the emergency department for further evaluation of  risk for brain bleed. Will not delay care in the emergency room by repairing lacerations here. We'll transfer down with dressings.  Discussed warning signs or symptoms. Please see discharge instructions. Patient expresses understanding.     Gregor Hams, MD 09/08/14 1256

## 2015-01-06 ENCOUNTER — Inpatient Hospital Stay (HOSPITAL_COMMUNITY): Payer: Medicare Other

## 2015-01-06 ENCOUNTER — Observation Stay (HOSPITAL_COMMUNITY)
Admission: EM | Admit: 2015-01-06 | Discharge: 2015-01-08 | Disposition: A | Discharge status: Home / Self Care | Payer: Medicare Other | Attending: Internal Medicine | Admitting: Internal Medicine

## 2015-01-06 ENCOUNTER — Encounter (HOSPITAL_COMMUNITY): Payer: Self-pay | Admitting: *Deleted

## 2015-01-06 ENCOUNTER — Emergency Department (HOSPITAL_COMMUNITY): Payer: Medicare Other

## 2015-01-06 DIAGNOSIS — R55 Syncope and collapse: Principal | ICD-10-CM | POA: Insufficient documentation

## 2015-01-06 DIAGNOSIS — I10 Essential (primary) hypertension: Secondary | ICD-10-CM | POA: Insufficient documentation

## 2015-01-06 DIAGNOSIS — E780 Pure hypercholesterolemia: Secondary | ICD-10-CM | POA: Insufficient documentation

## 2015-01-06 DIAGNOSIS — R531 Weakness: Secondary | ICD-10-CM | POA: Insufficient documentation

## 2015-01-06 DIAGNOSIS — Z79899 Other long term (current) drug therapy: Secondary | ICD-10-CM | POA: Diagnosis not present

## 2015-01-06 DIAGNOSIS — K219 Gastro-esophageal reflux disease without esophagitis: Secondary | ICD-10-CM | POA: Diagnosis not present

## 2015-01-06 DIAGNOSIS — R29898 Other symptoms and signs involving the musculoskeletal system: Secondary | ICD-10-CM | POA: Diagnosis not present

## 2015-01-06 DIAGNOSIS — E785 Hyperlipidemia, unspecified: Secondary | ICD-10-CM | POA: Diagnosis not present

## 2015-01-06 DIAGNOSIS — I493 Ventricular premature depolarization: Secondary | ICD-10-CM | POA: Insufficient documentation

## 2015-01-06 DIAGNOSIS — J45909 Unspecified asthma, uncomplicated: Secondary | ICD-10-CM | POA: Insufficient documentation

## 2015-01-06 DIAGNOSIS — E039 Hypothyroidism, unspecified: Secondary | ICD-10-CM | POA: Insufficient documentation

## 2015-01-06 DIAGNOSIS — Z7951 Long term (current) use of inhaled steroids: Secondary | ICD-10-CM | POA: Insufficient documentation

## 2015-01-06 DIAGNOSIS — M1990 Unspecified osteoarthritis, unspecified site: Secondary | ICD-10-CM | POA: Diagnosis not present

## 2015-01-06 DIAGNOSIS — Z79891 Long term (current) use of opiate analgesic: Secondary | ICD-10-CM | POA: Insufficient documentation

## 2015-01-06 LAB — URINALYSIS, ROUTINE W REFLEX MICROSCOPIC
BILIRUBIN URINE: NEGATIVE
Glucose, UA: NEGATIVE mg/dL
Hgb urine dipstick: NEGATIVE
KETONES UR: NEGATIVE mg/dL
Nitrite: NEGATIVE
Protein, ur: NEGATIVE mg/dL
Specific Gravity, Urine: 1.004 — ABNORMAL LOW (ref 1.005–1.030)
Urobilinogen, UA: 0.2 mg/dL (ref 0.0–1.0)
pH: 6.5 (ref 5.0–8.0)

## 2015-01-06 LAB — T4, FREE: FREE T4: 1.29 ng/dL — AB (ref 0.61–1.12)

## 2015-01-06 LAB — URINE MICROSCOPIC-ADD ON

## 2015-01-06 LAB — CBC
HCT: 38.7 % (ref 36.0–46.0)
HEMOGLOBIN: 12.4 g/dL (ref 12.0–15.0)
MCH: 29.7 pg (ref 26.0–34.0)
MCHC: 32 g/dL (ref 30.0–36.0)
MCV: 92.6 fL (ref 78.0–100.0)
Platelets: 224 10*3/uL (ref 150–400)
RBC: 4.18 MIL/uL (ref 3.87–5.11)
RDW: 15.5 % (ref 11.5–15.5)
WBC: 11.1 10*3/uL — ABNORMAL HIGH (ref 4.0–10.5)

## 2015-01-06 LAB — BASIC METABOLIC PANEL
ANION GAP: 10 (ref 5–15)
BUN: 14 mg/dL (ref 6–20)
CHLORIDE: 103 mmol/L (ref 101–111)
CO2: 23 mmol/L (ref 22–32)
Calcium: 9 mg/dL (ref 8.9–10.3)
Creatinine, Ser: 0.96 mg/dL (ref 0.44–1.00)
GFR calc Af Amer: >60 mL/min (ref >60)
GFR, EST NON AFRICAN AMERICAN: 53 mL/min — AB (ref >60)
Glucose, Bld: 103 mg/dL — ABNORMAL HIGH (ref 65–99)
Potassium: 4.2 mmol/L (ref 3.5–5.1)
Sodium: 136 mmol/L (ref 135–145)

## 2015-01-06 LAB — CBG MONITORING, ED: Glucose-Capillary: 91 mg/dL (ref 65–99)

## 2015-01-06 LAB — I-STAT CG4 LACTIC ACID, ED: Lactic Acid, Venous: 1.1 mmol/L (ref 0.5–2.0)

## 2015-01-06 LAB — I-STAT TROPONIN, ED: TROPONIN I, POC: 0.01 ng/mL (ref 0.00–0.08)

## 2015-01-06 MED ORDER — ENOXAPARIN SODIUM 40 MG/0.4ML ~~LOC~~ SOLN: 40.0000 mg | SUBCUTANEOUS | Status: DC

## 2015-01-06 MED ORDER — SODIUM CHLORIDE 0.9 % IV SOLN: 250.0000 mL | INTRAVENOUS | Status: DC | PRN

## 2015-01-06 MED ORDER — ENOXAPARIN SODIUM 40 MG/0.4ML ~~LOC~~ SOLN
40.0000 mg | Freq: Every day | SUBCUTANEOUS | Status: DC
  Filled 2015-01-06 (×2): qty 0.4

## 2015-01-06 MED ORDER — SODIUM CHLORIDE 0.9 % IJ SOLN
3.0000 mL | Freq: Two times a day (BID) | INTRAMUSCULAR | Status: DC
  Administered 2015-01-06 – 2015-01-08 (×4): 3 mL via INTRAVENOUS

## 2015-01-06 MED ORDER — MONTELUKAST SODIUM 10 MG PO TABS
10.0000 mg | ORAL_TABLET | Freq: Every day | ORAL | Status: DC
  Administered 2015-01-06 – 2015-01-07 (×2): 10 mg via ORAL
  Filled 2015-01-06 (×3): qty 1

## 2015-01-06 MED ORDER — ONDANSETRON HCL 4 MG/2ML IJ SOLN: 4.0000 mg | Freq: Four times a day (QID) | INTRAMUSCULAR | Status: DC | PRN

## 2015-01-06 MED ORDER — SODIUM CHLORIDE 0.9 % IJ SOLN: 3.0000 mL | INTRAMUSCULAR | Status: DC | PRN

## 2015-01-06 MED ORDER — ONDANSETRON HCL 4 MG PO TABS: 4.0000 mg | ORAL_TABLET | Freq: Four times a day (QID) | ORAL | Status: DC | PRN

## 2015-01-06 MED ORDER — ACETAMINOPHEN 325 MG PO TABS: 650.0000 mg | ORAL_TABLET | Freq: Four times a day (QID) | ORAL | Status: DC | PRN

## 2015-01-06 MED ORDER — ALUM & MAG HYDROXIDE-SIMETH 200-200-20 MG/5ML PO SUSP: 30.0000 mL | Freq: Four times a day (QID) | ORAL | Status: DC | PRN

## 2015-01-06 MED ORDER — MOMETASONE FURO-FORMOTEROL FUM 100-5 MCG/ACT IN AERO
2.0000 | INHALATION_SPRAY | Freq: Two times a day (BID) | RESPIRATORY_TRACT | Status: DC
Start: 2015-01-06 — End: 2015-01-08
  Administered 2015-01-06 – 2015-01-08 (×4): 2 via RESPIRATORY_TRACT
  Filled 2015-01-06: qty 8.8

## 2015-01-06 MED ORDER — ACETAMINOPHEN 650 MG RE SUPP: 650.0000 mg | Freq: Four times a day (QID) | RECTAL | Status: DC | PRN

## 2015-01-06 MED ORDER — PRAVASTATIN SODIUM 20 MG PO TABS
20.0000 mg | ORAL_TABLET | Freq: Every day | ORAL | Status: DC
  Administered 2015-01-07: 20 mg via ORAL
  Filled 2015-01-06 (×2): qty 1

## 2015-01-06 MED ORDER — PANTOPRAZOLE SODIUM 40 MG PO TBEC
40.0000 mg | DELAYED_RELEASE_TABLET | Freq: Every day | ORAL | Status: DC
  Administered 2015-01-06 – 2015-01-08 (×2): 40 mg via ORAL
  Filled 2015-01-06 (×2): qty 1

## 2015-01-06 NOTE — ED Notes (Signed)
Returned from [employer] and placed back on monitor.

## 2015-01-06 NOTE — ED Notes (Signed)
Pt reports that she was sweeping and sudden onset of fatigue and weakness, denies dizziness. Pt reports leaning against a wall and sliding down to floor, did not pass out or fall. No neuro deficits are noted at this time, grips are equal, speech clear, no arm drift or facial droop noted.

## 2015-01-06 NOTE — ED Notes (Signed)
Ambulated to restroom, and taken to ct.

## 2015-01-06 NOTE — ED Notes (Signed)
Admitting at bedside 

## 2015-01-06 NOTE — ED Provider Notes (Signed)
CSN: [id number]     Arrival date & time 01/06/15  1806 History   First MD Initiated Contact with Patient 01/06/15 1827     Chief Complaint  Patient presents with  . Near Syncope     (Consider location/radiation/quality/duration/timing/severity/associated sxs/prior Treatment) HPI Comments: Patient was sweeping the floor and became dizzy. Dizziness described as wooziness, no CP or SOB. She slid down the wall, no true syncope.  No diaphoresis.  Patient is a 81 y.o. female presenting with near-syncope. The history is provided by the patient.  Near Syncope This is a new problem. The current episode started 1 to 2 hours ago. Episode frequency: once. The problem has not changed since onset.Pertinent negatives include no chest pain and no shortness of breath. Nothing aggravates the symptoms. Nothing relieves the symptoms. She has tried nothing for the symptoms.    Past Medical History  Diagnosis Date  . Asthma   . High cholesterol   . GERD (gastroesophageal reflux disease)   . Hypothyroidism   . Arthritis   . Ventricular ectopy   . LBBB (left bundle branch block)    Past Surgical History  Procedure Laterality Date  . Knee arthroscopy     History reviewed. No pertinent family history. History  Substance Use Topics  . Smoking status: Passive Smoke Exposure - Never Smoker  . Smokeless tobacco: Not on file  . Alcohol Use: No   OB History    No data available     Review of Systems  Constitutional: Negative for fever.  Respiratory: Negative for cough and shortness of breath.   Cardiovascular: Positive for near-syncope. Negative for chest pain.  Neurological: Positive for dizziness (woozy).  All other systems reviewed and are negative.     Allergies  Erythromycin; Neosporin; and Penicillins  Home Medications   Prior to Admission medications   Medication Sig Start Date End Date Taking? Authorizing Provider  azelastine (ASTELIN) 0.1 % nasal spray Place 1-2 sprays into both  nostrils 2 (two) times daily.  04/18/14   Historical Provider, MD  Calcium-Magnesium-Zinc (CALCIUM-MAGNESUIUM-ZINC PO) Take 1 tablet by mouth daily.     Historical Provider, MD  Cholecalciferol (VITAMIN D PO) Take 1 tablet by mouth daily.     Historical Provider, MD  fluticasone (FLONASE) 50 MCG/ACT nasal spray Place 1 spray into both nostrils daily.    Historical Provider, MD  HYDROcodone-acetaminophen (NORCO) 5-325 MG per tablet Take 1 tablet by mouth every 6 (six) hours as needed for moderate pain. 09/08/14   Tatyana Kirichenko, PA-C  levothyroxine (SYNTHROID, LEVOTHROID) 88 MCG tablet Take 88 mcg by mouth daily before breakfast.  04/12/14   Historical Provider, MD  loratadine (CLARITIN) 10 MG tablet Take 10 mg by mouth daily.    Historical Provider, MD  lovastatin (MEVACOR) 20 MG tablet Take 20 mg by mouth daily at 6 PM.  04/12/14   Historical Provider, MD  mometasone-formoterol (DULERA) 100-5 MCG/ACT AERO Inhale 2 puffs into the lungs 2 (two) times daily.    Historical Provider, MD  montelukast (SINGULAIR) 10 MG tablet Take 10 mg by mouth at bedtime.  04/12/14   Historical Provider, MD  Omega-3 Fatty Acids (FISH OIL PO) Take 1 capsule by mouth daily.     Historical Provider, MD  omeprazole (PRILOSEC) 40 MG capsule Take 40 mg by mouth 2 (two) times daily.  04/18/14   Historical Provider, MD  OVER THE COUNTER MEDICATION Take 1 capsule by mouth 3 (three) times daily. Stool softener    Historical Provider, MD  PREMARIN vaginal cream  03/07/14   Historical Provider, MD   BP 155/107 mmHg  Pulse 68  Temp(Src) 97.5 F (36.4 C) (Oral)  Resp 18  Ht 5\' 2"  (1.575 m)  Wt 150 lb (68.04 kg)  BMI 27.43 kg/m2  SpO2 99% Physical Exam  Constitutional: She is oriented to person, place, and time. She appears well-developed and well-nourished. No distress.  HENT:  Head: Normocephalic and atraumatic.  Mouth/Throat: Oropharynx is clear and moist.  Eyes: EOM are normal. Pupils are equal, round, and reactive to  light.  Neck: Normal range of motion. Neck supple.  Cardiovascular: Normal rate and regular rhythm.  Exam reveals no friction rub.   No murmur heard. Pulmonary/Chest: Effort normal and breath sounds normal. No respiratory distress. She has no wheezes. She has no rales.  Abdominal: Soft. She exhibits no distension. There is no tenderness. There is no rebound.  Musculoskeletal: Normal range of motion. She exhibits no edema.  Neurological: She is alert and oriented to person, place, and time.  Skin: She is not diaphoretic.  Nursing note and vitals reviewed.   ED Course  Procedures (including critical care time) Labs Review Labs Reviewed  BASIC METABOLIC PANEL  CBC  URINALYSIS, ROUTINE W REFLEX MICROSCOPIC (NOT AT [facility])  CBG MONITORING, ED  I-STAT CG4 LACTIC ACID, ED  I-STAT TROPOININ, ED    Imaging Review Dg Chest 2 View  01/06/2015   CLINICAL DATA:  Sudden onset of fatigue and weakness while sweeping,leaned against wall then slid to floor, near syncope, history asthma  EXAM: CHEST  2 VIEW  COMPARISON:  05/22/2014  FINDINGS: Enlargement of cardiac silhouette.  Mediastinal contours and pulmonary vascularity normal.  Minimal subsegmental atelectasis RIGHT base.  Lungs otherwise clear.  No pleural effusion or pneumothorax.  Small questionable nodular density at lower RIGHT chest has a central lucency indicating this is likely a summation artifact.  Bones demineralized.  IMPRESSION: Mild enlargement of cardiac silhouette.  Minimal subsegmental atelectasis at RIGHT base.   Electronically Signed   By: Lavonia Dana M.D.   On: 01/06/2015 20:07     EKG Interpretation   Date/Time:  Saturday January 06 2015 18:19:04 EDT Ventricular Rate:  83 PR Interval:  178 QRS Duration: 114 QT Interval:  416 QTC Calculation: 488 R Axis:   -70 Text Interpretation:  Sinus rhythm with marked sinus arrhythmia with  frequent Premature ventricular complexes Left axis deviation Anterior  infarct , age  undetermined Abnormal ECG No significant change since last  tracing Confirmed by Mingo Amber  MD, [place] ([id number]) on 01/06/2015 6:28:38 PM      MDM   Final diagnoses:  Near syncope    67F here with near-syncope. Will plan on labs. EKG ok. Plan for admission.  Not orthostatic. Labs ok. Normal troponin. Observed by Dr. Wynelle Cleveland.    Evelina Bucy, MD 01/06/15 2042

## 2015-01-06 NOTE — H&P (Addendum)
[facility] History and Physical  Samantha [name] YKD:[id number] DOB: May 20, 1930 DOA: 01/06/2015   PCP: Mathews Argyle, MD    Chief Complaint: Left leg weakness, dizziness and fall  HPI: Samantha [name] is a 81 y.o. female with hyperlipidemia, hypothyroidism and asthma which is fairly well controlled. The patient was sweeping the hall today when she suddenly felt dizzy and then felt her left leg give out. She leaned against the wall and slowly leaned to the floor. She then crawled from the hall into the kitchen where her daughter-in-law helped her get up into a chair. The daughter-in-law check for basic symptoms of a stroke and did not notice any. The patient states that she did not have any blurred vision, slurred speech, numbness, tingling, palpitations or chest pain. Her legs seem to work fine while she was crawling and afterwards.  She was a little short of breath but this may been from her crawling. She was able to walk with assistance to a car but felt "weird" and lightheaded. Currently she is laying in bed and is asymptomatic. When asked to sit up for eye exam she continues to be asymptomatic. She has no history of a TIA or CVA does not take any aspirin. No history of palpitations or an MI.On admission in December for chest pain, she was noted to have ventricular bigeminy and trigeminy and discharged home with Coreg 3.125 mg twice a day. She also had a stress test at that time which was negative. He had a recent admission in April where she tripped, fell on her face and broke her nose.   General: The patient denies anorexia, fever, weight loss Cardiac: Denies chest pain, syncope, palpitations, pedal edema  Respiratory: Denies cough, shortness of breath, wheezing GI: Denies severe indigestion/heartburn, abdominal pain, nausea, vomiting, diarrhea and constipation- trace of blood in stool yesterday GU: Denies hematuria, incontinence, dysuria  Musculoskeletal: Denies arthritis  Skin:  Denies suspicious skin lesions Neurologic: Per history of present illness Psychiatry: Denies depression or anxiety. Hematologic: no easy bruising or bleeding  All other systems reviewed and found to be negative.  Past Medical History  Diagnosis Date  . Asthma   . High cholesterol   . GERD (gastroesophageal reflux disease)   . Hypothyroidism   . Arthritis   . Ventricular ectopy   . LBBB (left bundle branch block)   Rheumatic fever as a child  Past Surgical History  Procedure Laterality Date  . Knee arthroscopy      Social History: does not smoke or drink alcohol Visiting her son and daughter in law    Allergies  Allergen Reactions  . Erythromycin Diarrhea  . Neosporin [Neomycin-Bacitracin Zn-Polymyx] Rash  . Penicillins Rash    Family history: Mother died of a heart attack at age 54- Father died of throat cancer- brother with leukemia and 2 siblings with CHF-  Aunts have had DM    Prior to Admission medications   Medication Sig Start Date End Date Taking? Authorizing Provider  Ascorbic Acid (VITAMIN C PO) Take 1 tablet by mouth daily.   Yes Historical Provider, MD  azelastine (ASTELIN) 0.1 % nasal spray Place 1-2 sprays into both nostrils 2 (two) times daily.  04/18/14  Yes Historical Provider, MD  Calcium-Magnesium-Zinc (CALCIUM-MAGNESUIUM-ZINC PO) Take 1 tablet by mouth 3 (three) times daily.    Yes Historical Provider, MD  Cholecalciferol (VITAMIN D PO) Take 1 tablet by mouth daily.    Yes Historical Provider, MD  denosumab (PROLIA) 60 MG/ML SOLN injection  Inject 60 mg into the skin every 6 (six) months. Administer in upper arm, thigh, or abdomen   Yes Historical Provider, MD  levothyroxine (SYNTHROID, LEVOTHROID) 88 MCG tablet Take 88 mcg by mouth daily before breakfast.  04/12/14  Yes Historical Provider, MD  loratadine (CLARITIN) 10 MG tablet Take 10 mg by mouth daily.   Yes Historical Provider, MD  lovastatin (MEVACOR) 20 MG tablet Take 20 mg by mouth daily at 6  PM.  04/12/14  Yes Historical Provider, MD  mometasone-formoterol (DULERA) 100-5 MCG/ACT AERO Inhale 2 puffs into the lungs 2 (two) times daily.   Yes Historical Provider, MD  montelukast (SINGULAIR) 10 MG tablet Take 10 mg by mouth at bedtime.  04/12/14  Yes Historical Provider, MD  Omega-3 Fatty Acids (FISH OIL PO) Take 1 capsule by mouth daily.    Yes Historical Provider, MD  omeprazole (PRILOSEC) 40 MG capsule Take 40 mg by mouth 2 (two) times daily.  04/18/14  Yes Historical Provider, MD  sodium chloride (OCEAN) 0.65 % SOLN nasal spray Place 1 spray into both nostrils 2 (two) times daily.   Yes Historical Provider, MD  HYDROcodone-acetaminophen (NORCO) 5-325 MG per tablet Take 1 tablet by mouth every 6 (six) hours as needed for moderate pain. 09/08/14   Jeannett Senior, PA-C     Physical Exam: Filed Vitals:   01/06/15 1813 01/06/15 1930 01/06/15 2013  BP: 155/107 139/60 134/75  Pulse: 68 63 75  Temp: 97.5 F (36.4 C)    TempSrc: Oral    Resp: 18 17 12   Height: 5\' 2"  (1.575 m)    Weight: 68.04 kg (150 lb)    SpO2: 99% 96% 97%     General: Elderly female laying in bed in no distress  HEENT: Normocephalic and Atraumatic, Mucous membranes pink                PERRLA; EOM intact; No scleral icterus,                 Nares: Patent, Oropharynx: Clear, Fair Dentition                 Neck: FROM, no cervical lymphadenopathy, thyromegaly, carotid bruit or JVD;  Breasts: deferred CHEST WALL: No tenderness  CHEST: Normal respiration, clear to auscultation bilaterally  HEART: Regular rate and rhythm; no murmurs rubs or gallops  BACK: No kyphosis or scoliosis; no CVA tenderness  GI: Positive Bowel Sounds, soft, non-tender; no masses, no organomegaly Rectal Exam: deferred MSK: No cyanosis, clubbing, or edema Genitalia: not examined  SKIN:  no rash or ulceration  CNS: Alert and Oriented x 4, Nonfocal exam, CN 2-12 intact  Labs on Admission:  Basic Metabolic Panel:  Recent Labs Lab  01/06/15 1828  NA 136  K 4.2  CL 103  CO2 23  GLUCOSE 103*  BUN 14  CREATININE 0.96  CALCIUM 9.0   Liver Function Tests: No results for input(s): AST, ALT, ALKPHOS, BILITOT, PROT, ALBUMIN in the last 168 hours. No results for input(s): LIPASE, AMYLASE in the last 168 hours. No results for input(s): AMMONIA in the last 168 hours. CBC:  Recent Labs Lab 01/06/15 1828  WBC 11.1*  HGB 12.4  HCT 38.7  MCV 92.6  PLT 224   Cardiac Enzymes: No results for input(s): CKTOTAL, CKMB, CKMBINDEX, TROPONINI in the last 168 hours.  BNP (last 3 results) No results for input(s): BNP in the last 8760 hours.  ProBNP (last 3 results)  Recent Labs  05/22/14 1900  PROBNP 442.8    CBG:  Recent Labs Lab 01/06/15 1824  GLUCAP 91    Radiological Exams on Admission: Dg Chest 2 View  01/06/2015   CLINICAL DATA:  Sudden onset of fatigue and weakness while sweeping,leaned against wall then slid to floor, near syncope, history asthma  EXAM: CHEST  2 VIEW  COMPARISON:  05/22/2014  FINDINGS: Enlargement of cardiac silhouette.  Mediastinal contours and pulmonary vascularity normal.  Minimal subsegmental atelectasis RIGHT base.  Lungs otherwise clear.  No pleural effusion or pneumothorax.  Small questionable nodular density at lower RIGHT chest has a central lucency indicating this is likely a summation artifact.  Bones demineralized.  IMPRESSION: Mild enlargement of cardiac silhouette.  Minimal subsegmental atelectasis at RIGHT base.   Electronically Signed   By: Lavonia Dana M.D.   On: 01/06/2015 20:07    EKG: Independently reviewed. Sinus rhythm at 83 bpm with frequent PVCs   Assessment/Plan Active Problems:   Near syncope With a complaint of initial left leg weakness which apparently resolved quickly-currently normal neuro exam -Orthostatic vitals are negative - Observe overnight -TIA? We will obtain a head CT without contrast and order neuro checks every 4 hours -Follow on telemetry and  repeat 2-D echo- may need to resume Coreg which was ordered by cardiology last year for  bigeminy and trigeminy  ADDENUM: CT head report reviewed- negative for CVA   hypertension -States she has no history of this in when she is seen in her PCPs office, blood pressure is usually normal  Hyperlipidemia -Continue statin  Asthma -Continue dulera and Singulair  GERD -Continue Prilosec   Consulted:   Code Status:    DO NOT INTUBATE-she would like CPR pressors, antiarrhythmics and BiPAP Family Communication: Son and daughter-in-law at bedside   DVT Prophylaxis: Lovenox   Time spent:  37 minutes   [place], MD [facility]  If 7PM-7AM, please contact night-coverage [email or web address] 01/06/2015, 8:46 PM

## 2015-01-07 DIAGNOSIS — R55 Syncope and collapse: Secondary | ICD-10-CM

## 2015-01-07 DIAGNOSIS — I493 Ventricular premature depolarization: Secondary | ICD-10-CM | POA: Diagnosis not present

## 2015-01-07 DIAGNOSIS — R29898 Other symptoms and signs involving the musculoskeletal system: Secondary | ICD-10-CM

## 2015-01-07 DIAGNOSIS — E780 Pure hypercholesterolemia: Secondary | ICD-10-CM | POA: Diagnosis not present

## 2015-01-07 LAB — COMPREHENSIVE METABOLIC PANEL
ALK PHOS: 42 U/L (ref 38–126)
ALT: 15 U/L (ref 14–54)
AST: 19 U/L (ref 15–41)
Albumin: 3 g/dL — ABNORMAL LOW (ref 3.5–5.0)
Anion gap: 7 (ref 5–15)
BILIRUBIN TOTAL: 0.5 mg/dL (ref 0.3–1.2)
BUN: 13 mg/dL (ref 6–20)
CO2: 24 mmol/L (ref 22–32)
Calcium: 8.5 mg/dL — ABNORMAL LOW (ref 8.9–10.3)
Chloride: 110 mmol/L (ref 101–111)
Creatinine, Ser: 0.79 mg/dL (ref 0.44–1.00)
GFR calc Af Amer: >60 mL/min (ref >60)
GFR calc non Af Amer: >60 mL/min (ref >60)
Glucose, Bld: 95 mg/dL (ref 65–99)
Potassium: 3.9 mmol/L (ref 3.5–5.1)
Sodium: 141 mmol/L (ref 135–145)
TOTAL PROTEIN: 5.6 g/dL — AB (ref 6.5–8.1)

## 2015-01-07 LAB — TSH: TSH: 0.864 u[IU]/mL (ref 0.350–4.500)

## 2015-01-07 NOTE — Progress Notes (Signed)
[facility] PROGRESS NOTE  Samantha [name] [id number] DOB: 15-Dec-1929 DOA: 01/06/2015 PCP: Mathews Argyle, MD  Assessment/Plan: 1. Near syncope 1. Symptoms suggestive of possible vasovagal in etiology 2. Orthostatics neg 3. CT head neg 4. 2d echo is pending 5. Cont tele for now 2. HTN 1. BP stable 2. Cont monitor 3. HLD 1. On statin 4. Asthma 1. Stable 2. Lungs clear 5. GERD 1. On PPI 6. DVT prophylaxis 1. lovenox subq  Code Status: Partial Family Communication: Pt in room (indicate person spoken with, relationship, and if by phone, the number) Disposition Plan: Pending   Consultants:    Procedures:    Antibiotics:   (indicate start date, and stop date if known)  HPI/Subjective: States feeling well. Eager to go home  Objective: Filed Vitals:   01/07/15 0501 01/07/15 0509 01/07/15 0735 01/07/15 1300  BP:    140/75  Pulse:    88  Temp: 97.7 F (36.5 C)   97.3 F (36.3 C)  TempSrc: Oral   Oral  Resp: 20   16  Height:  5\' 2"  (1.575 m)    Weight:  67.8 kg (149 lb 7.6 oz)    SpO2:  97% 98% 98%    Intake/Output Summary (Last 24 hours) at 01/07/15 1608 Last data filed at 01/07/15 1300  Gross per 24 hour  Intake    240 ml  Output    601 ml  Net   -361 ml   Filed Weights   01/06/15 1813 01/07/15 0509  Weight: 68.04 kg (150 lb) 67.8 kg (149 lb 7.6 oz)    Exam:   General:  Awake, in nad  Cardiovascular: regular, s1, s2  Respiratory: normal resp effort, no wheezing  Abdomen: soft,nondistended  Musculoskeletal: perfused, no clubbing   Data Reviewed: Basic Metabolic Panel:  Recent Labs Lab 01/06/15 1828 01/07/15 0442  NA 136 141  K 4.2 3.9  CL 103 110  CO2 23 24  GLUCOSE 103* 95  BUN 14 13  CREATININE 0.96 0.79  CALCIUM 9.0 8.5*   Liver Function Tests:  Recent Labs Lab 01/07/15 0442  AST 19  ALT 15  ALKPHOS 42  BILITOT 0.5  PROT 5.6*  ALBUMIN 3.0*   No results for input(s): LIPASE, AMYLASE in the last 168  hours. No results for input(s): AMMONIA in the last 168 hours. CBC:  Recent Labs Lab 01/06/15 1828  WBC 11.1*  HGB 12.4  HCT 38.7  MCV 92.6  PLT 224   Cardiac Enzymes: No results for input(s): CKTOTAL, CKMB, CKMBINDEX, TROPONINI in the last 168 hours. BNP (last 3 results) No results for input(s): BNP in the last 8760 hours.  ProBNP (last 3 results)  Recent Labs  05/22/14 1900  PROBNP 442.8    CBG:  Recent Labs Lab 01/06/15 1824  GLUCAP 91    No results found for this or any previous visit (from the past 240 hour(s)).   Studies: Dg Chest 2 View  01/06/2015   CLINICAL DATA:  Sudden onset of fatigue and weakness while sweeping,leaned against wall then slid to floor, near syncope, history asthma  EXAM: CHEST  2 VIEW  COMPARISON:  05/22/2014  FINDINGS: Enlargement of cardiac silhouette.  Mediastinal contours and pulmonary vascularity normal.  Minimal subsegmental atelectasis RIGHT base.  Lungs otherwise clear.  No pleural effusion or pneumothorax.  Small questionable nodular density at lower RIGHT chest has a central lucency indicating this is likely a summation artifact.  Bones demineralized.  IMPRESSION: Mild enlargement of cardiac silhouette.  Minimal subsegmental atelectasis at RIGHT base.   Electronically Signed   By: Lavonia Dana M.D.   On: 01/06/2015 20:07   Ct Head Wo Contrast  01/06/2015   CLINICAL DATA:  Acute onset of generalized weakness and lethargy. Fell against wall, and slid down wall. Initial encounter.  EXAM: CT HEAD WITHOUT CONTRAST  TECHNIQUE: Contiguous axial images were obtained from the base of the skull through the vertex without intravenous contrast.  COMPARISON:  CT of the head performed 09/08/2014  FINDINGS: There is no evidence of acute infarction, mass lesion, or intra- or extra-axial hemorrhage on CT.  The posterior fossa, including the cerebellum, brainstem and fourth ventricle, is within normal limits. The third and lateral ventricles, and basal  ganglia are unremarkable in appearance. The cerebral hemispheres are symmetric in appearance, with normal gray-white differentiation. No mass effect or midline shift is seen.  There is no evidence of fracture; visualized osseous structures are unremarkable in appearance. The orbits are within normal limits. The paranasal sinuses and mastoid air cells are well-aerated. No significant soft tissue abnormalities are seen.  IMPRESSION: No evidence of traumatic intracranial injury or fracture.   Electronically Signed   By: Garald Balding M.D.   On: 01/06/2015 21:59    Scheduled Meds: . enoxaparin (LOVENOX) injection  40 mg Subcutaneous Daily  . mometasone-formoterol  2 puff Inhalation BID  . montelukast  10 mg Oral QHS  . pantoprazole  40 mg Oral Daily  . pravastatin  20 mg Oral q1800  . sodium chloride  3 mL Intravenous Q12H   Continuous Infusions:   Active Problems:   Intrinsic asthma   G E R D   PVC's (premature ventricular contractions)   High cholesterol   Hypothyroidism   Ventricular ectopy   Near syncope   Left leg weakness    Forbes Loll K  [facility] Pager [phone]. If 7PM-7AM, please contact night-coverage at [email or web address], password [facility] 01/07/2015, 4:08 PM  LOS: 1 day

## 2015-01-07 NOTE — Progress Notes (Signed)
PATIENT ARRIVED TO UNIT 2W FROM E.D. VIA STRETCHER. AMBULATED TO BED. VITALS OBTAINED. TELE APPLIED. ASSESSMENT PERFORMED.  FAMILY AT BEDSIDE.  PATIENT INSTRUCTED TO CALL FOR ASSISTANCE WHEN NEEDED AND TO REFRAIN FROM GETTING UP ALONE DUE TO DIAGNOSES.

## 2015-01-08 ENCOUNTER — Inpatient Hospital Stay (HOSPITAL_BASED_OUTPATIENT_CLINIC_OR_DEPARTMENT_OTHER): Payer: Medicare Other

## 2015-01-08 ENCOUNTER — Other Ambulatory Visit: Payer: Self-pay

## 2015-01-08 DIAGNOSIS — I493 Ventricular premature depolarization: Secondary | ICD-10-CM | POA: Diagnosis not present

## 2015-01-08 DIAGNOSIS — R55 Syncope and collapse: Secondary | ICD-10-CM | POA: Diagnosis not present

## 2015-01-08 DIAGNOSIS — E780 Pure hypercholesterolemia: Secondary | ICD-10-CM | POA: Diagnosis not present

## 2015-01-08 DIAGNOSIS — J45909 Unspecified asthma, uncomplicated: Secondary | ICD-10-CM | POA: Diagnosis not present

## 2015-01-08 DIAGNOSIS — K219 Gastro-esophageal reflux disease without esophagitis: Secondary | ICD-10-CM | POA: Diagnosis not present

## 2015-01-08 LAB — MAGNESIUM: Magnesium: 2.1 mg/dL (ref 1.7–2.4)

## 2015-01-08 LAB — OCCULT BLOOD X 1 CARD TO LAB, STOOL: FECAL OCCULT BLD: NEGATIVE

## 2015-01-08 MED ORDER — CARVEDILOL 3.125 MG PO TABS
3.1250 mg | ORAL_TABLET | Freq: Two times a day (BID) | ORAL | Status: DC
  Filled 2015-01-08 (×2): qty 1

## 2015-01-08 MED ORDER — CARVEDILOL 3.125 MG PO TABS: 3.1250 mg | ORAL_TABLET | Freq: Two times a day (BID) | ORAL | Status: DC

## 2015-01-08 NOTE — Progress Notes (Signed)
  Echocardiogram 2D Echocardiogram has been performed.  Johny Chess 01/08/2015, 8:58 AM

## 2015-01-08 NOTE — Discharge Summary (Signed)
Physician Discharge Summary  Samantha [name] [id number] DOB: 01-24-1930 DOA: 01/06/2015  PCP: Mathews Argyle, MD  Admit date: 01/06/2015 Discharge date: 01/08/2015  Time spent: 20 minutes  Recommendations for Outpatient Follow-up:  Follow up with PCP in 1-2 weeks Follow up with Cardiology in [place]  Discharge Diagnoses:  Active Problems:   Intrinsic asthma   G E R D   PVC's (premature ventricular contractions)   High cholesterol   Hypothyroidism   Ventricular ectopy   Near syncope   Left leg weakness   Discharge Condition: Improved  Diet recommendation: Heart healthy  Filed Weights   01/06/15 1813 01/07/15 0509  Weight: 68.04 kg (150 lb) 67.8 kg (149 lb 7.6 oz)    Histojry of present illness:  Please review H and P from 8/6 for details. Briefly, pt presented with LLE weakness with falls with concerns of near syncope. The patient was admitted for further work up.  Hospital Course:  1. Near syncope 1. Symptoms seemed suggestive of possible vasovagal in etiology 2. Orthostatics were neg 3. CT head was neg 4. 2d echo with normal LV EF and no significant change compared to prior echo on 05/23/14 5. Chart reviewed. Pt has a hx of bigeminy and was previously recommended to start coreg but had refused. 6. On further questioning, pt reports intermittent palpitations. Pt noted to have intermittent sinus tach on tele with possible 1.7sec pause. Have discussed case with Cardiology who also reviewed rhythm strip. Tele demonstrated primarily sinus tach with mentioned "pause" to be more likely PVC. Cardiology recommends pt to continue coreg and follow up with Cardiology. 2. HTN 1. BP remained stable 3. HLD 1. On statin 4. Asthma 1. Stable 2. Lungs clear 5. GERD 1. On PPI 6. DVT prophylaxis 1. lovenox subq while inpatient  Procedures:  2d echo  Consultations:  Discussed case with on-call Cardiologist  Discharge Exam: Filed Vitals:   01/07/15 2050 01/08/15 0505  01/08/15 0855 01/08/15 1300  BP: 139/76 130/60  105/77  Pulse: 76 64  85  Temp: 97.8 F (36.6 C) 97.6 F (36.4 C)  97.7 F (36.5 C)  TempSrc: Oral Oral  Oral  Resp: 16 16  17   Height:      Weight:      SpO2: 97% 97% 96% 98%    General: awake, in nad Cardiovascular: regular, s1, s2 Respiratory: normal resp effort, no wheezing  Discharge Instructions     Medication List    TAKE these medications        azelastine 0.1 % nasal spray  Commonly known as:  ASTELIN  Place 1-2 sprays into both nostrils 2 (two) times daily.     CALCIUM-MAGNESUIUM-ZINC PO  Take 1 tablet by mouth 3 (three) times daily.     carvedilol 3.125 MG tablet  Commonly known as:  COREG  Take 1 tablet (3.125 mg total) by mouth 2 (two) times daily with a meal.     denosumab 60 MG/ML Soln injection  Commonly known as:  PROLIA  Inject 60 mg into the skin every 6 (six) months. Administer in upper arm, thigh, or abdomen     FISH OIL PO  Take 1 capsule by mouth daily.     loratadine 10 MG tablet  Commonly known as:  CLARITIN  Take 10 mg by mouth daily.     lovastatin 20 MG tablet  Commonly known as:  MEVACOR  Take 20 mg by mouth daily at 6 PM.     mometasone-formoterol 100-5 MCG/ACT Aero  Commonly known as:  DULERA  Inhale 2 puffs into the lungs 2 (two) times daily.     montelukast 10 MG tablet  Commonly known as:  SINGULAIR  Take 10 mg by mouth at bedtime.     omeprazole 40 MG capsule  Commonly known as:  PRILOSEC  Take 40 mg by mouth 2 (two) times daily.     sodium chloride 0.65 % Soln nasal spray  Commonly known as:  OCEAN  Place 1 spray into both nostrils 2 (two) times daily.     VITAMIN C PO  Take 1 tablet by mouth daily.     VITAMIN D PO  Take 1 tablet by mouth daily.       Allergies  Allergen Reactions  . Erythromycin Diarrhea  . Neosporin [Neomycin-Bacitracin Zn-Polymyx] Rash  . Penicillins Rash   Follow-up Information    Follow up with Mathews Argyle, MD. Schedule  an appointment as soon as possible for a visit in 1 week.   Specialty:  Internal Medicine   Why:  Hospital follow up   Contact information:   [address] [place] [address] [phone]       Schedule an appointment as soon as possible for a visit with [name], NP.   Specialties:  Nurse Practitioner, Radiology, Cardiology   Why:  Hospital follow up   Contact information:   [place] [address] [phone]        The results of significant diagnostics from this hospitalization (including imaging, microbiology, ancillary and laboratory) are listed below for reference.    Significant Diagnostic Studies: Dg Chest 2 View  01/06/2015   CLINICAL DATA:  Sudden onset of fatigue and weakness while sweeping,leaned against wall then slid to floor, near syncope, history asthma  EXAM: CHEST  2 VIEW  COMPARISON:  05/22/2014  FINDINGS: Enlargement of cardiac silhouette.  Mediastinal contours and pulmonary vascularity normal.  Minimal subsegmental atelectasis RIGHT base.  Lungs otherwise clear.  No pleural effusion or pneumothorax.  Small questionable nodular density at lower RIGHT chest has a central lucency indicating this is likely a summation artifact.  Bones demineralized.  IMPRESSION: Mild enlargement of cardiac silhouette.  Minimal subsegmental atelectasis at RIGHT base.   Electronically Signed   By: Lavonia Dana M.D.   On: 01/06/2015 20:07   Ct Head Wo Contrast  01/06/2015   CLINICAL DATA:  Acute onset of generalized weakness and lethargy. Fell against wall, and slid down wall. Initial encounter.  EXAM: CT HEAD WITHOUT CONTRAST  TECHNIQUE: Contiguous axial images were obtained from the base of the skull through the vertex without intravenous contrast.  COMPARISON:  CT of the head performed 09/08/2014  FINDINGS: There is no evidence of acute infarction, mass lesion, or intra- or extra-axial hemorrhage on CT.  The posterior fossa, including the cerebellum,  brainstem and fourth ventricle, is within normal limits. The third and lateral ventricles, and basal ganglia are unremarkable in appearance. The cerebral hemispheres are symmetric in appearance, with normal gray-white differentiation. No mass effect or midline shift is seen.  There is no evidence of fracture; visualized osseous structures are unremarkable in appearance. The orbits are within normal limits. The paranasal sinuses and mastoid air cells are well-aerated. No significant soft tissue abnormalities are seen.  IMPRESSION: No evidence of traumatic intracranial injury or fracture.   Electronically Signed   By: Garald Balding M.D.   On: 01/06/2015 21:59    Microbiology: No results found for this or  any previous visit (from the past 240 hour(s)).   Labs: Basic Metabolic Panel:  Recent Labs Lab 01/06/15 1828 01/07/15 0442 01/08/15 1020  NA 136 141  --   K 4.2 3.9  --   CL 103 110  --   CO2 23 24  --   GLUCOSE 103* 95  --   BUN 14 13  --   CREATININE 0.96 0.79  --   CALCIUM 9.0 8.5*  --   MG  --   --  2.1   Liver Function Tests:  Recent Labs Lab 01/07/15 0442  AST 19  ALT 15  ALKPHOS 42  BILITOT 0.5  PROT 5.6*  ALBUMIN 3.0*   No results for input(s): LIPASE, AMYLASE in the last 168 hours. No results for input(s): AMMONIA in the last 168 hours. CBC:  Recent Labs Lab 01/06/15 1828  WBC 11.1*  HGB 12.4  HCT 38.7  MCV 92.6  PLT 224   Cardiac Enzymes: No results for input(s): CKTOTAL, CKMB, CKMBINDEX, TROPONINI in the last 168 hours. BNP: BNP (last 3 results) No results for input(s): BNP in the last 8760 hours.  ProBNP (last 3 results)  Recent Labs  05/22/14 1900  PROBNP 442.8    CBG:  Recent Labs Lab 01/06/15 1824  GLUCAP 91       Signed:  Juliauna Stueve K  [facility] 01/11/2015, 2:25 AM

## 2015-01-08 NOTE — Progress Notes (Signed)
Utilization review completed.  

## 2015-01-08 NOTE — Progress Notes (Signed)
This admission has been reviewed and determined not to meet inpatient level of care. Both attending Physician and Medical Director are in agreement this should be an Observation encounter according to the Medicare Conditions of Participation as set forth in CFR 42 Chapter 456 482.12 (c) and the Medi are Condition Code-44 Regulations CFR 42 Chapter 100 - 04 50.3. The Patient and/or Patient Representative was notified via delivery of the "MEDICARE OBSERVATION STATUS NOTIFICATION".              

## 2015-01-08 NOTE — Progress Notes (Signed)
ON CALL NOTIFIED OF PATIENT'S 1.74 SEC PAUSE AND CONTINUATION OF BI- AND TRIGEMINY.

## 2015-01-08 NOTE — Progress Notes (Signed)
Discussed with patient discharge instructions, she verbalized agreement and understanding.  Patient's IV was discontinued with no complications.  Patient to be taken out in wheelchair with all belongings to go home in private vehicle. Philis Fendt, RN-BSN 01/08/2015 [phone]

## 2015-01-11 ENCOUNTER — Telehealth: Payer: Self-pay | Admitting: Adult Health

## 2015-01-11 NOTE — Telephone Encounter (Signed)
Bought BP machine in December but just used today and took BP cause she felt like she may have reflux,got reading 179/98  Just started on Coreg 3.125 mg 3 days ,plans to go back to [place] and buy a larger cuff.will come tomorrow at 4 pm for BP check

## 2015-01-11 NOTE — Telephone Encounter (Signed)
Pt states her BP is 178/98

## 2015-01-12 ENCOUNTER — Ambulatory Visit (INDEPENDENT_AMBULATORY_CARE_PROVIDER_SITE_OTHER): Payer: Medicare Other

## 2015-01-12 VITALS — BP 130/68 | HR 63 | Ht 62.0 in | Wt 152.0 lb

## 2015-01-12 DIAGNOSIS — Z136 Encounter for screening for cardiovascular disorders: Secondary | ICD-10-CM | POA: Diagnosis not present

## 2015-01-12 DIAGNOSIS — Z0130 Encounter for examination of blood pressure without abnormal findings: Secondary | ICD-10-CM

## 2015-01-12 DIAGNOSIS — I10 Essential (primary) hypertension: Secondary | ICD-10-CM

## 2015-01-12 NOTE — Progress Notes (Signed)
No problems at this time. PT has follow visit in [place]. 8/16 with KL

## 2015-01-12 NOTE — Patient Instructions (Addendum)
Your physician recommends that you schedule a follow-up appointment in: TBD  Your blood pressure today is 130/68. Your heart rate is 63.   Thank you for choosing [facility]!

## 2015-01-16 ENCOUNTER — Encounter (INDEPENDENT_AMBULATORY_CARE_PROVIDER_SITE_OTHER): Payer: Medicare Other

## 2015-01-16 ENCOUNTER — Telehealth: Payer: Self-pay

## 2015-01-16 ENCOUNTER — Ambulatory Visit (INDEPENDENT_AMBULATORY_CARE_PROVIDER_SITE_OTHER): Payer: Medicare Other | Admitting: Adult Health

## 2015-01-16 ENCOUNTER — Encounter: Payer: Self-pay | Admitting: Adult Health

## 2015-01-16 ENCOUNTER — Other Ambulatory Visit (HOSPITAL_COMMUNITY)
Admission: AD | Admit: 2015-01-16 | Discharge: 2015-01-16 | Disposition: A | Discharge status: Home / Self Care | Payer: Medicare Other | Source: Other Acute Inpatient Hospital | Attending: Adult Health | Admitting: Adult Health

## 2015-01-16 VITALS — BP 124/68 | HR 68 | Ht 62.0 in | Wt 151.8 lb

## 2015-01-16 DIAGNOSIS — R002 Palpitations: Secondary | ICD-10-CM

## 2015-01-16 DIAGNOSIS — R000 Tachycardia, unspecified: Secondary | ICD-10-CM | POA: Diagnosis not present

## 2015-01-16 DIAGNOSIS — I10 Essential (primary) hypertension: Secondary | ICD-10-CM

## 2015-01-16 LAB — BASIC METABOLIC PANEL
ANION GAP: 8 (ref 5–15)
BUN: 12 mg/dL (ref 6–20)
CHLORIDE: 106 mmol/L (ref 101–111)
CO2: 24 mmol/L (ref 22–32)
CREATININE: 0.84 mg/dL (ref 0.44–1.00)
Calcium: 9.1 mg/dL (ref 8.9–10.3)
GFR calc non Af Amer: >60 mL/min (ref >60)
Glucose, Bld: 91 mg/dL (ref 65–99)
Potassium: 4.1 mmol/L (ref 3.5–5.1)
Sodium: 138 mmol/L (ref 135–145)

## 2015-01-16 LAB — TSH: TSH: 2.604 u[IU]/mL (ref 0.350–4.500)

## 2015-01-16 LAB — MAGNESIUM: Magnesium: 2.2 mg/dL (ref 1.7–2.4)

## 2015-01-16 NOTE — Progress Notes (Deleted)
Name: Samantha [name]    DOB: December 02, 1929  Age: 81 y.o.  MR#: [id number]       PCP:  Mathews Argyle, MD      Insurance: Payor: [employer] MEDICARE / Plan: [facility] MEDICARE / Product Type: *No Product type* /   CC:    Chief Complaint  Patient presents with  . Loss of Consciousness  . Palpitations    VS Filed Vitals:   01/16/15 1419  BP: 124/68  Pulse: 68  Height: 5\' 2"  (1.575 m)  Weight: 151 lb 12.8 oz (68.856 kg)  SpO2: 96%    Weights Current Weight  01/16/15 151 lb 12.8 oz (68.856 kg)  01/12/15 152 lb (68.947 kg)  01/07/15 149 lb 7.6 oz (67.8 kg)    Blood Pressure  BP Readings from Last 3 Encounters:  01/16/15 124/68  01/12/15 130/68  01/08/15 105/77     Admit date:  (Not on file) Last encounter with RMR:  01/11/2015   Allergy Erythromycin; Neosporin; and Penicillins  Current Outpatient Prescriptions  Medication Sig Dispense Refill  . Ascorbic Acid (VITAMIN C PO) Take 1 tablet by mouth daily.    Marland Kitchen azelastine (ASTELIN) 0.1 % nasal spray Place 1-2 sprays into both nostrils 2 (two) times daily.     . Calcium-Magnesium-Zinc (CALCIUM-MAGNESUIUM-ZINC PO) Take 1 tablet by mouth 3 (three) times daily.     . carvedilol (COREG) 3.125 MG tablet Take 3.125 mg by mouth 2 (two) times daily with a meal.    . Cholecalciferol (VITAMIN D PO) Take 1 tablet by mouth daily.     Marland Kitchen denosumab (PROLIA) 60 MG/ML SOLN injection Inject 60 mg into the skin every 6 (six) months. Administer in upper arm, thigh, or abdomen    . levothyroxine (SYNTHROID, LEVOTHROID) 88 MCG tablet Take 88 mcg by mouth daily before breakfast.    . loratadine (CLARITIN) 10 MG tablet Take 10 mg by mouth daily.    Marland Kitchen lovastatin (MEVACOR) 20 MG tablet Take 20 mg by mouth daily at 6 PM.     . mometasone-formoterol (DULERA) 100-5 MCG/ACT AERO Inhale 2 puffs into the lungs 2 (two) times daily.    . montelukast (SINGULAIR) 10 MG tablet Take 10 mg by mouth at bedtime.     . Omega-3 Fatty Acids (FISH OIL PO) Take 1  capsule by mouth daily.     Marland Kitchen omeprazole (PRILOSEC) 40 MG capsule Take 40 mg by mouth 2 (two) times daily.     . sodium chloride (OCEAN) 0.65 % SOLN nasal spray Place 1 spray into both nostrils 2 (two) times daily.     No current facility-administered medications for this visit.    Discontinued Meds:    Medications Discontinued During This Encounter  Medication Reason  . carvedilol (COREG) 3.125 MG tablet Error    Patient Active Problem List   Diagnosis Date Noted  . Near syncope 01/06/2015  . Left leg weakness 01/06/2015  . High cholesterol   . Hypothyroidism   . Ventricular ectopy   . Chest pain 05/22/2014  . Left bundle branch block 05/22/2014  . PVC's (premature ventricular contractions) 05/22/2014  . Intrinsic asthma 02/14/2010  . HOARSENESS 02/14/2010  . CHEST PAIN, ATYPICAL 02/14/2010  . ALLERGIC RHINITIS 02/13/2010  . G E R D 02/13/2010    LABS    Component Value Date/Time   NA 141 01/07/2015 0442   NA 136 01/06/2015 1828   NA 138 05/23/2014 0437   K 3.9 01/07/2015 0442   K 4.2 01/06/2015  1828   K 4.0 05/23/2014 0437   CL 110 01/07/2015 0442   CL 103 01/06/2015 1828   CL 107 05/23/2014 0437   CO2 24 01/07/2015 0442   CO2 23 01/06/2015 1828   CO2 25 05/23/2014 0437   GLUCOSE 95 01/07/2015 0442   GLUCOSE 103* 01/06/2015 1828   GLUCOSE 88 05/23/2014 0437   BUN 13 01/07/2015 0442   BUN 14 01/06/2015 1828   BUN 18 05/23/2014 0437   CREATININE 0.79 01/07/2015 0442   CREATININE 0.96 01/06/2015 1828   CREATININE 1.15* 05/23/2014 0437   CALCIUM 8.5* 01/07/2015 0442   CALCIUM 9.0 01/06/2015 1828   CALCIUM 8.7 05/23/2014 0437   GFRNONAA >60 01/07/2015 0442   GFRNONAA 53* 01/06/2015 1828   GFRNONAA 43* 05/23/2014 0437   GFRAA >60 01/07/2015 0442   GFRAA >60 01/06/2015 1828   GFRAA 50* 05/23/2014 0437   CMP     Component Value Date/Time   NA 141 01/07/2015 0442   K 3.9 01/07/2015 0442   CL 110 01/07/2015 0442   CO2 24 01/07/2015 0442   GLUCOSE 95  01/07/2015 0442   BUN 13 01/07/2015 0442   CREATININE 0.79 01/07/2015 0442   CALCIUM 8.5* 01/07/2015 0442   PROT 5.6* 01/07/2015 0442   ALBUMIN 3.0* 01/07/2015 0442   AST 19 01/07/2015 0442   ALT 15 01/07/2015 0442   ALKPHOS 42 01/07/2015 0442   BILITOT 0.5 01/07/2015 0442   GFRNONAA >60 01/07/2015 0442   GFRAA >60 01/07/2015 0442       Component Value Date/Time   WBC 11.1* 01/06/2015 1828   WBC 10.1 05/23/2014 0437   WBC 12.2* 05/22/2014 1231   HGB 12.4 01/06/2015 1828   HGB 11.5* 05/23/2014 0437   HGB 12.7 05/22/2014 1231   HCT 38.7 01/06/2015 1828   HCT 36.9 05/23/2014 0437   HCT 39.3 05/22/2014 1231   MCV 92.6 01/06/2015 1828   MCV 92.9 05/23/2014 0437   MCV 93.1 05/22/2014 1231    Lipid Panel     Component Value Date/Time   CHOL 173 05/23/2014 0437   TRIG 98 05/23/2014 0437   HDL 49 05/23/2014 0437   CHOLHDL 3.5 05/23/2014 0437   VLDL 20 05/23/2014 0437   LDLCALC 104* 05/23/2014 0437    ABG No results found for: PHART, PCO2ART, PO2ART, HCO3, TCO2, ACIDBASEDEF, O2SAT   Lab Results  Component Value Date   TSH 0.864 01/06/2015   BNP (last 3 results) No results for input(s): BNP in the last 8760 hours.  ProBNP (last 3 results)  Recent Labs  05/22/14 1900  PROBNP 442.8    Cardiac Panel (last 3 results) No results for input(s): CKTOTAL, CKMB, TROPONINI, RELINDX in the last 72 hours.  Iron/TIBC/Ferritin/ %Sat No results found for: IRON, TIBC, FERRITIN, IRONPCTSAT   EKG Orders placed or performed during the hospital encounter of 01/06/15  . ED EKG  . ED EKG  . EKG 12-Lead  . EKG 12-Lead     Prior Assessment and Plan Problem List as of 01/16/2015      Cardiovascular and Mediastinum   Left bundle branch block   Last Assessment & Plan 06/09/2014 Office Visit Written 06/09/2014  1:53 PM by Lendon Colonel, NP    I have spent 25 minutes with this patient going over all of her medications and listing the indications. I have also discussed the stress  test results with her giving reassurance. She is very reluctant to take carvedilol. As she is asymptomatic, I will hold  off on this. She is tearful stating that she does not want to add another medication to her regimen.       PVC's (premature ventricular contractions)   Last Assessment & Plan 06/09/2014 Office Visit Written 06/09/2014  1:55 PM by Lendon Colonel, NP    Asymptomatic currently. Will hold off on starting carvedilol. She is mildly hypertensive today and I have explained to her that this medication is also for hypertensive control. She does not want to take it at this time. She will follow up with her PCP, Dr. Felipa Eth to discuss further.       Ventricular ectopy   Near syncope     Respiratory   ALLERGIC RHINITIS   Intrinsic asthma     Digestive   Marcy Salvo D   Last Assessment & Plan 06/09/2014 Office Visit Written 06/09/2014  1:56 PM by Lendon Colonel, NP    She wishes to change PPI. I will defer to PCP for this. I have given her a copy of the [place] list to discuss with PCP on visit next month.         Endocrine   Hypothyroidism     Nervous and Auditory   Left leg weakness     Other   HOARSENESS   CHEST PAIN, ATYPICAL   Chest pain   High cholesterol       Imaging: Dg Chest 2 View  01/06/2015   CLINICAL DATA:  Sudden onset of fatigue and weakness while sweeping,leaned against wall then slid to floor, near syncope, history asthma  EXAM: CHEST  2 VIEW  COMPARISON:  05/22/2014  FINDINGS: Enlargement of cardiac silhouette.  Mediastinal contours and pulmonary vascularity normal.  Minimal subsegmental atelectasis RIGHT base.  Lungs otherwise clear.  No pleural effusion or pneumothorax.  Small questionable nodular density at lower RIGHT chest has a central lucency indicating this is likely a summation artifact.  Bones demineralized.  IMPRESSION: Mild enlargement of cardiac silhouette.  Minimal subsegmental atelectasis at RIGHT base.   Electronically Signed   By: Lavonia Dana M.D.   On: 01/06/2015 20:07   Ct Head Wo Contrast  01/06/2015   CLINICAL DATA:  Acute onset of generalized weakness and lethargy. Fell against wall, and slid down wall. Initial encounter.  EXAM: CT HEAD WITHOUT CONTRAST  TECHNIQUE: Contiguous axial images were obtained from the base of the skull through the vertex without intravenous contrast.  COMPARISON:  CT of the head performed 09/08/2014  FINDINGS: There is no evidence of acute infarction, mass lesion, or intra- or extra-axial hemorrhage on CT.  The posterior fossa, including the cerebellum, brainstem and fourth ventricle, is within normal limits. The third and lateral ventricles, and basal ganglia are unremarkable in appearance. The cerebral hemispheres are symmetric in appearance, with normal gray-white differentiation. No mass effect or midline shift is seen.  There is no evidence of fracture; visualized osseous structures are unremarkable in appearance. The orbits are within normal limits. The paranasal sinuses and mastoid air cells are well-aerated. No significant soft tissue abnormalities are seen.  IMPRESSION: No evidence of traumatic intracranial injury or fracture.   Electronically Signed   By: Garald Balding M.D.   On: 01/06/2015 21:59

## 2015-01-16 NOTE — Progress Notes (Signed)
Cardiology Office Note   Date:  01/16/2015   ID:  Samantha [name], DOB Jan 13, 1930, MRN [id number]  PCP:  Samantha Argyle, MD  Cardiologist:  Samantha [name]) Samantha Sims, NP   Chief Complaint  Patient presents with  . Loss of Consciousness  . Palpitations      History of Present Illness: Samantha [name] is a 81 y.o. female who presents for ongoing assessment and management near syncope, with history of hyperlipidemia, GERD, hypothyroidism, asthma, and ventricular ectopy.  The patient was admitted to the hospital in August of 2016, with lower extremity weakness and falls, with concerns for near syncope.  Symptoms seem suggestive of possible vasovagal etiology.  Orthostatics are found to be negative, along with CT of the head.  Echocardiogram was completed during the hospitalization: EF 50-55%, wall motion was normal without regional wall motion abnormalities, except for septal wall hypokinesis.  The patient was found to have grade 1 diastolic dysfunction.  Telemetry during hospitalization noted a pulse 1.7 second pause.  Which on evaluation, was found to be a PVC's.  She was continued on carvedilol, lovastatin, fish oil, without any additions or changes in medication regimen.She was found to have sinus tachycardia rate of 140 bpm on review of telemetry on 01/07/2015 with some intermittent recurrence throughout the hospitalization.   Since being discharged, she has a lengthy list of somatic complaints. She has a lengthy list of questions as well. She states he did not know why she was put on the medications and what they were treating. She talks at length about being afraid to be at home alone with the HR going up and down and episode of near syncope that was the cause of her recent admission.   Past Medical History  Diagnosis Date  . Asthma   . High cholesterol   . GERD (gastroesophageal reflux disease)   . Hypothyroidism   . Arthritis   . Ventricular ectopy   . LBBB (left bundle branch  block)     Past Surgical History  Procedure Laterality Date  . Knee arthroscopy       Current Outpatient Prescriptions  Medication Sig Dispense Refill  . Ascorbic Acid (VITAMIN C PO) Take 1 tablet by mouth daily.    Marland Kitchen azelastine (ASTELIN) 0.1 % nasal spray Place 1-2 sprays into both nostrils 2 (two) times daily.     . Calcium-Magnesium-Zinc (CALCIUM-MAGNESUIUM-ZINC PO) Take 1 tablet by mouth 3 (three) times daily.     . carvedilol (COREG) 3.125 MG tablet Take 3.125 mg by mouth 2 (two) times daily with a meal.    . Cholecalciferol (VITAMIN D PO) Take 1 tablet by mouth daily.     Marland Kitchen denosumab (PROLIA) 60 MG/ML SOLN injection Inject 60 mg into the skin every 6 (six) months. Administer in upper arm, thigh, or abdomen    . levothyroxine (SYNTHROID, LEVOTHROID) 88 MCG tablet Take 88 mcg by mouth daily before breakfast.    . loratadine (CLARITIN) 10 MG tablet Take 10 mg by mouth daily.    Marland Kitchen lovastatin (MEVACOR) 20 MG tablet Take 20 mg by mouth daily at 6 PM.     . mometasone-formoterol (DULERA) 100-5 MCG/ACT AERO Inhale 2 puffs into the lungs 2 (two) times daily.    . montelukast (SINGULAIR) 10 MG tablet Take 10 mg by mouth at bedtime.     . Omega-3 Fatty Acids (FISH OIL PO) Take 1 capsule by mouth daily.     Marland Kitchen omeprazole (PRILOSEC) 40 MG capsule Take 40 mg  by mouth 2 (two) times daily.     . sodium chloride (OCEAN) 0.65 % SOLN nasal spray Place 1 spray into both nostrils 2 (two) times daily.     No current facility-administered medications for this visit.    Allergies:   Erythromycin; Neosporin; and Penicillins    Social History:  The patient  reports that she has been passively smoking.  She does not have any smokeless tobacco history on file. She reports that she does not drink alcohol or use illicit drugs.   Family History:  The patient's family history is not on file.    ROS: .   All other systems are reviewed and negative.Unless otherwise mentioned in H&P above.   PHYSICAL  EXAM: VS:  BP 124/68 mmHg  Pulse 68  Ht 5\' 2"  (1.575 m)  Wt 151 lb 12.8 oz (68.856 kg)  BMI 27.76 kg/m2  SpO2 96% , BMI Body mass index is 27.76 kg/(m^2). GEN: Well nourished, well developed, in no acute distress HEENT: normal Neck: no JVD, carotid bruits, or masses Cardiac: RRR; occasional extrasystole.  no murmurs, rubs, or gallops,no edema  Respiratory:  Clear to auscultation bilaterally, normal work of breathing GI: soft, nontender, nondistended, + BS MS: no deformity or atrophy Skin: warm and dry, no rash Neuro:  Strength and sensation are intact Psych: euthymic mood, full affect   EKG:  EKG  ordered today. The ekg ordered today demonstrates NSR, with frequent PVC's, intraventricular conduction delay. Rate of 67 bpm.    Recent Labs: 05/22/2014: Pro B Natriuretic peptide (BNP) 442.8 01/06/2015: Hemoglobin 12.4; Platelets 224; TSH 0.864 01/07/2015: ALT 15; BUN 13; Creatinine, Ser 0.79; Potassium 3.9; Sodium 141 01/08/2015: Magnesium 2.1    Lipid Panel    Component Value Date/Time   CHOL 173 05/23/2014 0437   TRIG 98 05/23/2014 0437   HDL 49 05/23/2014 0437   CHOLHDL 3.5 05/23/2014 0437   VLDL 20 05/23/2014 0437   LDLCALC 104* 05/23/2014 0437      Wt Readings from Last 3 Encounters:  01/16/15 151 lb 12.8 oz (68.856 kg)  01/12/15 152 lb (68.947 kg)  01/07/15 149 lb 7.6 oz (67.8 kg)      Other studies Reviewed: Additional studies/ records that were reviewed today include: Telemetry of recent hospitalization.  Review of the above records demonstrates: Episodes of tachycardia and frequent PVC's. Rate up to 140 bpm.   ASSESSMENT AND PLAN:  1.  Near syncope: May be related to inappropriate tachycardia with frequent ventricular ectopy. She is now on carvedilol 3.25 mg BID. I will check a Mg as she is on PPI. Also check a TSH as she has a hx of hypothyroidism, as this was not checked during hospitalization. She will have a cardiac monitor placed to assess for more  arrhythmias or tachycardia. She is instructed to take her medications as directed. Will make further recommendations once test results are completed.   2. Anxiety about health status: I have listened to her many anxieties and and answered multiple questions. I have spend 25 minutes with her and have given as much reassurance as I can but have asked her to speak to her PCP to assist with her issues, and for her to speak with her family about her concerns about living alone.      Current medicines are reviewed at length with the patient today.    Labs/ tests ordered today include: TSH, Magnesium   Orders Placed This Encounter  Procedures  . Magnesium  . Basic Metabolic  Panel (BMET)  . TSH  . Cardiac event monitor  . EKG 12-Lead     Disposition:   FU with cardiology in [place] one month.Olena Heckle, NP  01/16/2015 4:14 PM    [facility] [address] [place], [place] [address] Phone: [phone]; Fax: [phone]

## 2015-01-16 NOTE — Patient Instructions (Signed)
Your physician recommends that you schedule a follow-up appointment in: 1 Month  Your physician recommends that you continue on your current medications as directed. Please refer to the Current Medication list given to you today.  Your physician has recommended that you wear an event monitor. Event monitors are medical devices that record the heart's electrical activity. Doctors most often [place] these monitors to diagnose arrhythmias. Arrhythmias are problems with the speed or rhythm of the heartbeat. The monitor is a small, portable device. You can wear one while you do your normal daily activities. This is usually used to diagnose what is causing palpitations/syncope (passing out).  Your physician recommends that you have lab work done today. Hypertension Hypertension, commonly called high blood pressure, is when the force of blood pumping through your arteries is too strong. Your arteries are the blood vessels that carry blood from your heart throughout your body. A blood pressure reading consists of a higher number over a lower number, such as 110/72. The higher number (systolic) is the pressure inside your arteries when your heart pumps. The lower number (diastolic) is the pressure inside your arteries when your heart relaxes. Ideally you want your blood pressure below 120/80. Hypertension forces your heart to work harder to pump blood. Your arteries may become narrow or stiff. Having hypertension puts you at risk for heart disease, stroke, and other problems.  RISK FACTORS Some risk factors for high blood pressure are controllable. Others are not.  Risk factors you cannot control include:   Race. You may be at higher risk if you are African American.  Age. Risk increases with age.  Gender. Men are at higher risk than women before age 68 years. After age 77, women are at higher risk than men. Risk factors you can control include:  Not getting enough exercise or physical activity.  Being  overweight.  Getting too much fat, sugar, calories, or salt in your diet.  Drinking too much alcohol. SIGNS AND SYMPTOMS Hypertension does not usually cause signs or symptoms. Extremely high blood pressure (hypertensive crisis) may cause headache, anxiety, shortness of breath, and nosebleed. DIAGNOSIS  To check if you have hypertension, your health care provider will measure your blood pressure while you are seated, with your arm held at the level of your heart. It should be measured at least twice using the same arm. Certain conditions can cause a difference in blood pressure between your right and left arms. A blood pressure reading that is higher than normal on one occasion does not mean that you need treatment. If one blood pressure reading is high, ask your health care provider about having it checked again. TREATMENT  Treating high blood pressure includes making lifestyle changes and possibly taking medicine. Living a healthy lifestyle can help lower high blood pressure. You may need to change some of your habits. Lifestyle changes may include:  Following the DASH diet. This diet is high in fruits, vegetables, and whole grains. It is low in salt, red meat, and added sugars.  Getting at least 2 hours of brisk physical activity every week.  Losing weight if necessary.  Not smoking.  Limiting alcoholic beverages.  Learning ways to reduce stress. If lifestyle changes are not enough to get your blood pressure under control, your health care provider may prescribe medicine. You may need to take more than one. Work closely with your health care provider to understand the risks and benefits. HOME CARE INSTRUCTIONS  Have your blood pressure rechecked as directed by  your health care provider.   Take medicines only as directed by your health care provider. Follow the directions carefully. Blood pressure medicines must be taken as prescribed. The medicine does not work as well when you skip  doses. Skipping doses also puts you at risk for problems.   Do not smoke.   Monitor your blood pressure at home as directed by your health care provider. SEEK MEDICAL CARE IF:   You think you are having a reaction to medicines taken.  You have recurrent headaches or feel dizzy.  You have swelling in your ankles.  You have trouble with your vision. SEEK IMMEDIATE MEDICAL CARE IF:  You develop a severe headache or confusion.  You have unusual weakness, numbness, or feel faint.  You have severe chest or abdominal pain.  You vomit repeatedly.  You have trouble breathing. MAKE SURE YOU:   Understand these instructions.  Will watch your condition.  Will get help right away if you are not doing well or get worse. Document Released: 05/19/2005 Document Revised: 10/03/2013 Document Reviewed: 03/11/2013 [facility] Patient Information 2015 [place], [place]. This information is not intended to replace advice given to you by your health care provider. Make sure you discuss any questions you have with your health care provider.  Thank you for choosing [facility]!

## 2015-01-16 NOTE — Telephone Encounter (Signed)
Will wait test results. May need to increase carvedilol to 6.25  BID if necessary.

## 2015-01-16 NOTE — Telephone Encounter (Signed)
Per Kathlee Nations at [facility] ,pt had 20 PVC's in 1 minute, trigemeny.Ms.Lawrence NP made aware,await magnesium and TSH levels

## 2015-01-17 ENCOUNTER — Telehealth: Payer: Self-pay

## 2015-01-17 MED ORDER — METOPROLOL TARTRATE 25 MG PO TABS: 25.0000 mg | ORAL_TABLET | Freq: Two times a day (BID) | ORAL | Status: DC

## 2015-01-17 NOTE — Telephone Encounter (Signed)
-----   Message from Lendon Colonel, NP sent at 01/16/2015  8:48 PM EDT ----- All labs WNL with the exception of calcium. Initial report from the cardiac monitor showed trigeminy. She is on coreg 3.125 mg BID. Discontinue this and begin metoprolol 25 mg BID which is more cardioselective than coreg. No other changes.

## 2015-01-17 NOTE — Telephone Encounter (Signed)
[id number]  E-scribed lopressor to pharmacy

## 2015-01-17 NOTE — Telephone Encounter (Signed)
Pt called and notified of med change

## 2015-02-08 ENCOUNTER — Other Ambulatory Visit: Payer: Self-pay

## 2015-02-08 DIAGNOSIS — R000 Tachycardia, unspecified: Secondary | ICD-10-CM

## 2015-02-16 ENCOUNTER — Ambulatory Visit: Payer: Medicare Other | Admitting: Adult Health

## 2015-02-21 NOTE — Progress Notes (Signed)
Cardiology Office Note   Date:  02/22/2015   ID:  Samantha [name], DOB Aug 19, 1929, MRN [id number]  PCP:  Mathews Argyle, MD  Cardiologist: Katz/ [name], NP   Chief Complaint  Patient presents with  . Palpitations  . Hypertension      History of Present Illness: Samantha [name] is a 81 y.o. female who presents for ongoing assessment of recurrent chest pain, hyperlipidemia, GERD, hypothyroidism. MPI was completed 06/08/2014 that was negative for ischemia, with normal echo. She is here  For follow up for symptoms of syncope and palpitations after addition of lopressor. She had admission for same and is quiet anxious about medications and being at home alone. Labs were ordered.   Na+ 138, K+ 4.1, Mg+ 2.2, TSH 2.604. All labs reviewed and were normal.    Today, still with anxiety about her health. She takes her blood pressure multiple times throughout the day. She is inconsistent on the time. She takes her medicine. There are times when she stays in bed most of the morning. "I do not have anything to do that day." At other times she gets up and takes a medicine earlier. As a result, her blood pressure has fluctuated dramatically throughout the day. She said tonsils dizzy as result of this.    Past Medical History  Diagnosis Date  . Asthma   . High cholesterol   . GERD (gastroesophageal reflux disease)   . Hypothyroidism   . Arthritis   . Ventricular ectopy   . LBBB (left bundle branch block)     Past Surgical History  Procedure Laterality Date  . Knee arthroscopy       Current Outpatient Prescriptions  Medication Sig Dispense Refill  . Ascorbic Acid (VITAMIN C PO) Take 1 tablet by mouth daily.    Marland Kitchen azelastine (ASTELIN) 0.1 % nasal spray Place 1-2 sprays into both nostrils 2 (two) times daily.     . Calcium-Magnesium-Zinc (CALCIUM-MAGNESUIUM-ZINC PO) Take 1 tablet by mouth 3 (three) times daily.     . Cholecalciferol (VITAMIN D PO) Take 1 tablet by mouth daily.      Marland Kitchen denosumab (PROLIA) 60 MG/ML SOLN injection Inject 60 mg into the skin every 6 (six) months. Administer in upper arm, thigh, or abdomen    . levothyroxine (SYNTHROID, LEVOTHROID) 88 MCG tablet Take 88 mcg by mouth daily before breakfast.    . loratadine (CLARITIN) 10 MG tablet Take 10 mg by mouth daily.    Marland Kitchen lovastatin (MEVACOR) 20 MG tablet Take 20 mg by mouth daily at 6 PM.     . metoprolol tartrate (LOPRESSOR) 25 MG tablet Take 1 tablet (25 mg total) by mouth 2 (two) times daily. 180 tablet 3  . mometasone-formoterol (DULERA) 100-5 MCG/ACT AERO Inhale 2 puffs into the lungs 2 (two) times daily.    . montelukast (SINGULAIR) 10 MG tablet Take 10 mg by mouth at bedtime.     . Omega-3 Fatty Acids (FISH OIL PO) Take 1 capsule by mouth daily.     Marland Kitchen omeprazole (PRILOSEC) 40 MG capsule Take 40 mg by mouth 2 (two) times daily.     . sodium chloride (OCEAN) 0.65 % SOLN nasal spray Place 1 spray into both nostrils 2 (two) times daily.     No current facility-administered medications for this visit.    Allergies:   Erythromycin; Neosporin; and Penicillins    Social History:  The patient  reports that she has been passively smoking.  She does not have any  smokeless tobacco history on file. She reports that she does not drink alcohol or use illicit drugs.   Family History:  The patient's family history is not on file.    ROS: All other systems are reviewed and negative. Unless otherwise mentioned in H&P    PHYSICAL EXAM: VS:  BP 140/68 mmHg  Pulse 61  Ht 5\' 2"  (1.575 m)  Wt 153 lb (69.4 kg)  BMI 27.98 kg/m2  SpO2 97% , BMI Body mass index is 27.98 kg/(m^2). GEN: Well nourished, well developed, in no acute distress HEENT: normal Neck: no JVD, carotid bruits, or masses Cardiac: RRR; no murmurs, rubs, or gallops,no edema  Respiratory:  clear to auscultation bilaterally, normal work of breathing GI: soft, nontender, nondistended, + BS MS: no deformity or atrophy Skin: warm and dry, no  rash Neuro:  Strength and sensation are intact Psych: euthymic mood, full affect, slightly anxious   Recent Labs: 05/22/2014: Pro B Natriuretic peptide (BNP) 442.8 01/06/2015: Hemoglobin 12.4; Platelets 224 01/07/2015: ALT 15 01/16/2015: BUN 12; Creatinine, Ser 0.84; Magnesium 2.2; Potassium 4.1; Sodium 138; TSH 2.604    Lipid Panel    Component Value Date/Time   CHOL 173 05/23/2014 0437   TRIG 98 05/23/2014 0437   HDL 49 05/23/2014 0437   CHOLHDL 3.5 05/23/2014 0437   VLDL 20 05/23/2014 0437   LDLCALC 104* 05/23/2014 0437      Wt Readings from Last 3 Encounters:  02/22/15 153 lb (69.4 kg)  01/16/15 151 lb 12.8 oz (68.856 kg)  01/12/15 152 lb (68.947 kg)      ASSESSMENT AND PLAN:  1. Hypertension: I have spoken with the patient at length about only taking her blood pressure once a day. At the same time everyday. She is also counseled on consistency on medication timing. I have advised her to take it at the same time every day and she will need to be more consistent in this to avoid symptoms. She verbalizes understanding. I have discussed changing her metoprolol from twice a day dosing to once a day to make it easier for her as she normally goes to bed about the same time every night. She would like to continue the twice a day dosing for now.  2. Palpitations:she still continues to have occasional palpitations. I believe this is more related to anxiety than any metabolic or cardiac etiology. I reviewed labs and they are all within normal limits.   Current medicines are reviewed at length with the patient today.     Disposition:   FU with 6 months  Signed, [name], NP  02/22/2015 1:58 PM    [facility] [address], [place], [place] [address] Phone: [phone]; Fax: [phone]

## 2015-02-22 ENCOUNTER — Encounter: Payer: Self-pay | Admitting: Adult Health

## 2015-02-22 ENCOUNTER — Ambulatory Visit (INDEPENDENT_AMBULATORY_CARE_PROVIDER_SITE_OTHER): Payer: Medicare Other | Admitting: Adult Health

## 2015-02-22 VITALS — BP 140/68 | HR 61 | Ht 62.0 in | Wt 153.0 lb

## 2015-02-22 DIAGNOSIS — I10 Essential (primary) hypertension: Secondary | ICD-10-CM

## 2015-02-22 DIAGNOSIS — R002 Palpitations: Secondary | ICD-10-CM

## 2015-02-22 NOTE — Progress Notes (Signed)
Name: Samantha [name]    DOB: 1929-08-01  Age: 81 y.o.  MR#: [id number]       PCP:  Mathews Argyle, MD      Insurance: Payor: [employer] MEDICARE / Plan: [facility] MEDICARE / Product Type: *No Product type* /   CC:    Chief Complaint  Patient presents with  . Palpitations  . Hypertension    VS Filed Vitals:   02/22/15 1353  BP: 140/68  Pulse: 61  Height: 5\' 2"  (1.575 m)  Weight: 153 lb (69.4 kg)  SpO2: 97%    Weights Current Weight  02/22/15 153 lb (69.4 kg)  01/16/15 151 lb 12.8 oz (68.856 kg)  01/12/15 152 lb (68.947 kg)    Blood Pressure  BP Readings from Last 3 Encounters:  02/22/15 140/68  01/16/15 124/68  01/12/15 130/68     Admit date:  (Not on file) Last encounter with RMR:  01/16/2015   Allergy Erythromycin; Neosporin; and Penicillins  Current Outpatient Prescriptions  Medication Sig Dispense Refill  . Ascorbic Acid (VITAMIN C PO) Take 1 tablet by mouth daily.    Marland Kitchen azelastine (ASTELIN) 0.1 % nasal spray Place 1-2 sprays into both nostrils 2 (two) times daily.     . Calcium-Magnesium-Zinc (CALCIUM-MAGNESUIUM-ZINC PO) Take 1 tablet by mouth 3 (three) times daily.     . Cholecalciferol (VITAMIN D PO) Take 1 tablet by mouth daily.     Marland Kitchen denosumab (PROLIA) 60 MG/ML SOLN injection Inject 60 mg into the skin every 6 (six) months. Administer in upper arm, thigh, or abdomen    . levothyroxine (SYNTHROID, LEVOTHROID) 88 MCG tablet Take 88 mcg by mouth daily before breakfast.    . loratadine (CLARITIN) 10 MG tablet Take 10 mg by mouth daily.    Marland Kitchen lovastatin (MEVACOR) 20 MG tablet Take 20 mg by mouth daily at 6 PM.     . metoprolol tartrate (LOPRESSOR) 25 MG tablet Take 1 tablet (25 mg total) by mouth 2 (two) times daily. 180 tablet 3  . mometasone-formoterol (DULERA) 100-5 MCG/ACT AERO Inhale 2 puffs into the lungs 2 (two) times daily.    . montelukast (SINGULAIR) 10 MG tablet Take 10 mg by mouth at bedtime.     . Omega-3 Fatty Acids (FISH OIL PO) Take 1 capsule  by mouth daily.     Marland Kitchen omeprazole (PRILOSEC) 40 MG capsule Take 40 mg by mouth 2 (two) times daily.     . sodium chloride (OCEAN) 0.65 % SOLN nasal spray Place 1 spray into both nostrils 2 (two) times daily.     No current facility-administered medications for this visit.    Discontinued Meds:   There are no discontinued medications.  Patient Active Problem List   Diagnosis Date Noted  . Near syncope 01/06/2015  . Left leg weakness 01/06/2015  . High cholesterol   . Hypothyroidism   . Ventricular ectopy   . Chest pain 05/22/2014  . Left bundle branch block 05/22/2014  . PVC's (premature ventricular contractions) 05/22/2014  . Intrinsic asthma 02/14/2010  . HOARSENESS 02/14/2010  . CHEST PAIN, ATYPICAL 02/14/2010  . ALLERGIC RHINITIS 02/13/2010  . G E R D 02/13/2010    LABS    Component Value Date/Time   NA 138 01/16/2015 1540   NA 141 01/07/2015 0442   NA 136 01/06/2015 1828   K 4.1 01/16/2015 1540   K 3.9 01/07/2015 0442   K 4.2 01/06/2015 1828   CL 106 01/16/2015 1540   CL 110 01/07/2015  0442   CL 103 01/06/2015 1828   CO2 24 01/16/2015 1540   CO2 24 01/07/2015 0442   CO2 23 01/06/2015 1828   GLUCOSE 91 01/16/2015 1540   GLUCOSE 95 01/07/2015 0442   GLUCOSE 103* 01/06/2015 1828   BUN 12 01/16/2015 1540   BUN 13 01/07/2015 0442   BUN 14 01/06/2015 1828   CREATININE 0.84 01/16/2015 1540   CREATININE 0.79 01/07/2015 0442   CREATININE 0.96 01/06/2015 1828   CALCIUM 9.1 01/16/2015 1540   CALCIUM 8.5* 01/07/2015 0442   CALCIUM 9.0 01/06/2015 1828   GFRNONAA >60 01/16/2015 1540   GFRNONAA >60 01/07/2015 0442   GFRNONAA 53* 01/06/2015 1828   GFRAA >60 01/16/2015 1540   GFRAA >60 01/07/2015 0442   GFRAA >60 01/06/2015 1828   CMP     Component Value Date/Time   NA 138 01/16/2015 1540   K 4.1 01/16/2015 1540   CL 106 01/16/2015 1540   CO2 24 01/16/2015 1540   GLUCOSE 91 01/16/2015 1540   BUN 12 01/16/2015 1540   CREATININE 0.84 01/16/2015 1540   CALCIUM  9.1 01/16/2015 1540   PROT 5.6* 01/07/2015 0442   ALBUMIN 3.0* 01/07/2015 0442   AST 19 01/07/2015 0442   ALT 15 01/07/2015 0442   ALKPHOS 42 01/07/2015 0442   BILITOT 0.5 01/07/2015 0442   GFRNONAA >60 01/16/2015 1540   GFRAA >60 01/16/2015 1540       Component Value Date/Time   WBC 11.1* 01/06/2015 1828   WBC 10.1 05/23/2014 0437   WBC 12.2* 05/22/2014 1231   HGB 12.4 01/06/2015 1828   HGB 11.5* 05/23/2014 0437   HGB 12.7 05/22/2014 1231   HCT 38.7 01/06/2015 1828   HCT 36.9 05/23/2014 0437   HCT 39.3 05/22/2014 1231   MCV 92.6 01/06/2015 1828   MCV 92.9 05/23/2014 0437   MCV 93.1 05/22/2014 1231    Lipid Panel     Component Value Date/Time   CHOL 173 05/23/2014 0437   TRIG 98 05/23/2014 0437   HDL 49 05/23/2014 0437   CHOLHDL 3.5 05/23/2014 0437   VLDL 20 05/23/2014 0437   LDLCALC 104* 05/23/2014 0437    ABG No results found for: PHART, PCO2ART, PO2ART, HCO3, TCO2, ACIDBASEDEF, O2SAT   Lab Results  Component Value Date   TSH 2.604 01/16/2015   BNP (last 3 results) No results for input(s): BNP in the last 8760 hours.  ProBNP (last 3 results)  Recent Labs  05/22/14 1900  PROBNP 442.8    Cardiac Panel (last 3 results) No results for input(s): CKTOTAL, CKMB, TROPONINI, RELINDX in the last 72 hours.  Iron/TIBC/Ferritin/ %Sat No results found for: IRON, TIBC, FERRITIN, IRONPCTSAT   EKG Orders placed or performed in visit on 01/16/15  . EKG 12-Lead     Prior Assessment and Plan Problem List as of 02/22/2015      Cardiovascular and Mediastinum   Left bundle branch block   Last Assessment & Plan 06/09/2014 Office Visit Written 06/09/2014  1:53 PM by Lendon Colonel, NP    I have spent 25 minutes with this patient going over all of her medications and listing the indications. I have also discussed the stress test results with her giving reassurance. She is very reluctant to take carvedilol. As she is asymptomatic, I will hold off on this. She is tearful  stating that she does not want to add another medication to her regimen.       PVC's (premature ventricular contractions)   Last  Assessment & Plan 06/09/2014 Office Visit Written 06/09/2014  1:55 PM by Lendon Colonel, NP    Asymptomatic currently. Will hold off on starting carvedilol. She is mildly hypertensive today and I have explained to her that this medication is also for hypertensive control. She does not want to take it at this time. She will follow up with her PCP, Dr. Felipa Eth to discuss further.       Ventricular ectopy   Near syncope     Respiratory   ALLERGIC RHINITIS   Intrinsic asthma     Digestive   Marcy Salvo D   Last Assessment & Plan 06/09/2014 Office Visit Written 06/09/2014  1:56 PM by Lendon Colonel, NP    She wishes to change PPI. I will defer to PCP for this. I have given her a copy of the [place] list to discuss with PCP on visit next month.         Endocrine   Hypothyroidism     Nervous and Auditory   Left leg weakness     Other   HOARSENESS   CHEST PAIN, ATYPICAL   Chest pain   High cholesterol       Imaging: No results found.

## 2015-02-22 NOTE — Patient Instructions (Signed)
Your physician wants you to follow-up in: 6 months with Kathryn Lawrence, NP. You will receive a reminder letter in the mail two months in advance. If you don't receive a letter, please call our office to schedule the follow-up appointment.  Your physician recommends that you continue on your current medications as directed. Please refer to the Current Medication list given to you today.  Thank you for choosing [facility]!   

## 2015-03-29 ENCOUNTER — Telehealth: Payer: Self-pay | Admitting: Adult Health

## 2015-03-29 NOTE — Telephone Encounter (Signed)
Will forward to Kathryn Lawrence, N.P.  

## 2015-03-29 NOTE — Telephone Encounter (Signed)
She can be changed to metoprolol XL 25 mg daily from lopressor 25 mg BID. She needs a local Rx to pick up and a send off for 90 days each.   Thank you.

## 2015-03-29 NOTE — Telephone Encounter (Signed)
Voicemail received from patient:   Patient wanted to know if we could change her medication that [name] prescribed from the 2x daily (12 hour dose) to the 1x daily (24 hour dose). She was breaking up on the phone so I couldn't make out the name of the medication she mentioned in her voicemail. Possibly the metoprolol? She also said she wants to switch her pharmacy to mail order pharmacy ([facility]).

## 2015-03-30 MED ORDER — METOPROLOL SUCCINATE ER 25 MG PO TB24: 25.0000 mg | ORAL_TABLET | Freq: Every day | ORAL | Status: DC

## 2015-03-30 NOTE — Telephone Encounter (Signed)
Patient notified

## 2015-04-03 ENCOUNTER — Telehealth: Payer: Self-pay | Admitting: Adult Health

## 2015-04-03 NOTE — Telephone Encounter (Signed)
Patient states that she has been having some dizziness when waking up. Wants to know if this is side effect of new medication. /t g

## 2015-04-03 NOTE — Telephone Encounter (Signed)
Will forward to K Lawrence NP 

## 2015-04-03 NOTE — Telephone Encounter (Signed)
She is on the same medications that she has always been on only long acting as she requested. Uncertain why she is dizzy. She should see PCP for this. She is an anxious lady who is very concerned about her health. She will need to make sure she is eating and drinking. If her BP and HR are normal would not change anything. She should not take her BP several times a day however, as she has in the past.

## 2015-04-03 NOTE — Telephone Encounter (Signed)
Spoke with patient. Explained K. Lawrence NP notes. Patient states she has plans to go out of town today but was unsure if she should go. Pt agreed to go see her PCP.

## 2015-04-09 ENCOUNTER — Ambulatory Visit: Payer: Medicare Other | Admitting: Cardiology

## 2015-04-12 NOTE — Progress Notes (Signed)
Patient ID: Samantha [name], female   DOB: [birth year], 81 y.o.   MRN: [id number] Cardiology Office Note   Date:  04/13/2015   ID:  Samantha [name], DOB August 13, 1929, MRN [id number]  PCP:  Mathews Argyle, MD  Cardiologist: Katz/ Jenkins Rouge, MD   Chief Complaint  Patient presents with  . New Evaluation    HTN, Syncope, no refills      History of Present Illness: Samantha [name] is a 81 y.o. female who presents for ongoing assessment of recurrent chest pain, hyperlipidemia, GERD, hypothyroidism. New patient to me   MPI was completed 06/08/2014 that was negative for ischemia, with normal echo. She is here  For follow up for symptoms of syncope and palpitations after addition of lopressor. She had admission for same and is quiet anxious about medications and being at home alone. Labs were ordered.   Na+ 138, K+ 4.1, Mg+ 2.2, TSH 2.604. All labs reviewed and were normal.    Today, still with anxiety about her health. She takes her blood pressure multiple times throughout the day. She is inconsistent on the time. She takes her medicine. There are times when she stays in bed most of the morning. "I do not have anything to do that day." At other times she gets up and takes a medicine earlier. As a result, her blood pressure has fluctuated dramatically throughout the day.   Have arranged for her beta blocker to be once daily succinate.  Educated her about its use for BP and palpitations.  She indicates history of murmur but benign    Past Medical History  Diagnosis Date  . Asthma   . High cholesterol   . GERD (gastroesophageal reflux disease)   . Hypothyroidism   . Arthritis   . Ventricular ectopy   . LBBB (left bundle branch block)     Past Surgical History  Procedure Laterality Date  . Knee arthroscopy       Current Outpatient Prescriptions  Medication Sig Dispense Refill  . Ascorbic Acid (VITAMIN C PO) Take 1 tablet by mouth daily.    Marland Kitchen azelastine (ASTELIN) 0.1 % nasal spray  Place 1-2 sprays into both nostrils 2 (two) times daily.     . Calcium-Magnesium-Zinc (CALCIUM-MAGNESUIUM-ZINC PO) Take 1 tablet by mouth 3 (three) times daily.     . Cholecalciferol (VITAMIN D PO) Take 1 tablet by mouth daily.     Marland Kitchen denosumab (PROLIA) 60 MG/ML SOLN injection Inject 60 mg into the skin every 6 (six) months. Administer in upper arm, thigh, or abdomen    . levothyroxine (SYNTHROID, LEVOTHROID) 88 MCG tablet Take 88 mcg by mouth daily before breakfast.    . loratadine (CLARITIN) 10 MG tablet Take 10 mg by mouth daily.    Marland Kitchen lovastatin (MEVACOR) 20 MG tablet Take 20 mg by mouth daily at 6 PM.     . metoprolol succinate (TOPROL XL) 25 MG 24 hr tablet Take 1 tablet (25 mg total) by mouth daily. 90 tablet 3  . mometasone-formoterol (DULERA) 100-5 MCG/ACT AERO Inhale 2 puffs into the lungs 2 (two) times daily.    . montelukast (SINGULAIR) 10 MG tablet Take 10 mg by mouth at bedtime.     . Omega-3 Fatty Acids (FISH OIL PO) Take 1 capsule by mouth daily.     Marland Kitchen omeprazole (PRILOSEC) 40 MG capsule Take 40 mg by mouth 2 (two) times daily.     . sodium chloride (OCEAN) 0.65 % SOLN nasal spray Place 1 spray  into both nostrils 2 (two) times daily.     No current facility-administered medications for this visit.    Allergies:   Erythromycin; Neosporin; and Penicillins    Social History:  The patient  reports that she has been passively smoking.  She does not have any smokeless tobacco history on file. She reports that she does not drink alcohol or use illicit drugs.   Family History:  The patient's Family history is unknown by patient.    ROS: All other systems are reviewed and negative. Unless otherwise mentioned in H&P    PHYSICAL EXAM: VS:  BP 140/62 mmHg  Pulse 60  Ht 5\' 2"  (1.575 m)  Wt 68.584 kg (151 lb 3.2 oz)  BMI 27.65 kg/m2 , BMI Body mass index is 27.65 kg/(m^2). GEN: Well nourished, well developed, in no acute distress HEENT: normal Neck: no JVD, carotid bruits, or  masses Cardiac: RRR; no murmurs, rubs, or gallops,no edema  Respiratory:  clear to auscultation bilaterally, normal work of breathing GI: soft, nontender, nondistended, + BS MS: no deformity or atrophy Skin: warm and dry, no rash Neuro:  Strength and sensation are intact Psych: euthymic mood, full affect, slightly anxious   Recent Labs: 05/22/2014: Pro B Natriuretic peptide (BNP) 442.8 01/06/2015: Hemoglobin 12.4; Platelets 224 01/07/2015: ALT 15 01/16/2015: BUN 12; Creatinine, Ser 0.84; Magnesium 2.2; Potassium 4.1; Sodium 138; TSH 2.604    Lipid Panel    Component Value Date/Time   CHOL 173 05/23/2014 0437   TRIG 98 05/23/2014 0437   HDL 49 05/23/2014 0437   CHOLHDL 3.5 05/23/2014 0437   VLDL 20 05/23/2014 0437   LDLCALC 104* 05/23/2014 0437      Wt Readings from Last 3 Encounters:  04/13/15 68.584 kg (151 lb 3.2 oz)  02/22/15 69.4 kg (153 lb)  01/16/15 68.856 kg (151 lb 12.8 oz)    ECG:  SR ICLBBB  PVC    ASSESSMENT AND PLAN:  1. Hypertension: I have spoken with the patient at length about only taking her blood pressure once a day. At the same time everyday. She is also counseled on consistency on medication timing. I have advised her to take it at the same time every day and she will need to be more consistent in this to avoid symptoms. She verbalizes understanding. I have discussed changing her metoprolol from twice a day dosing to once a day to make it easier for her as she normally goes to bed about the same time every night. She would like to continue the twice a day dosing for now.  2. Palpitations:she still continues to have occasional palpitations. I believe this is more related to anxiety than any metabolic or cardiac etiology. I reviewed labs and they are all within normal limits.  3. Murmur:  Benign short SEM no valve disease on echo    Current medicines are reviewed at length with the patient today.     Disposition:   FU with me PRN   Signed, Jenkins Rouge,  MD  04/13/2015 9:37 AM    [facility] [id number]  [address] [place], [place] [address] Phone: [phone]; Fax: [phone]

## 2015-04-13 ENCOUNTER — Encounter: Payer: Self-pay | Admitting: Cardiovascular Disease

## 2015-04-13 ENCOUNTER — Ambulatory Visit (INDEPENDENT_AMBULATORY_CARE_PROVIDER_SITE_OTHER): Payer: Medicare Other | Admitting: Cardiovascular Disease

## 2015-04-13 VITALS — BP 140/62 | HR 60 | Ht 62.0 in | Wt 151.2 lb

## 2015-04-13 DIAGNOSIS — R002 Palpitations: Secondary | ICD-10-CM | POA: Diagnosis not present

## 2015-04-13 DIAGNOSIS — I10 Essential (primary) hypertension: Secondary | ICD-10-CM | POA: Diagnosis not present

## 2015-04-13 NOTE — Patient Instructions (Addendum)
Medication Instructions:  Your physician recommends that you continue on your current medications as directed. Please refer to the Current Medication list given to you today.  Labwork: NONE  Testing/Procedures: NONE  Follow-Up: Your physician wants you to follow-up as needed.  If you need a refill on your cardiac medications before your next appointment, please call your pharmacy.   

## 2015-08-29 ENCOUNTER — Other Ambulatory Visit: Payer: Self-pay | Admitting: Internal Medicine

## 2015-08-29 ENCOUNTER — Ambulatory Visit
Admission: RE | Admit: 2015-08-29 | Discharge: 2015-08-29 | Disposition: A | Discharge status: Home / Self Care | Payer: Medicare Other | Source: Ambulatory Visit | Attending: Internal Medicine | Admitting: Internal Medicine

## 2015-08-29 DIAGNOSIS — I499 Cardiac arrhythmia, unspecified: Secondary | ICD-10-CM

## 2015-08-29 NOTE — Progress Notes (Addendum)
Patient ID: Samantha [name], female   DOB: 13-Feb-1930, 82 y.o.   MRN: [id number] Cardiology Office Note   Date:  08/31/2015   ID:  Samantha [name], DOB 1930-03-22, MRN [id number]  PCP:  Mathews Argyle, MD  Cardiologist: Katz/ Jenkins Rouge, MD   Chief Complaint  Patient presents with  . Hypertension      History of Present Illness: Samantha [name] is a 82 y.o. female who presents for ongoing assessment of recurrent chest pain, hyperlipidemia, GERD, hypothyroidism. New patient to me   MPI was completed 06/08/2014 that was negative for ischemia, with normal echo. She is here  For follow up for symptoms of syncope and palpitations after addition of lopressor. She had admission for same and is quiet anxious about medications and being at home alone. Labs were ordered.   Na+ 138, K+ 4.1, Mg+ 2.2, TSH 2.604. All labs reviewed and were normal.    Today, still with anxiety about her health. She takes her blood pressure multiple times throughout the day. She is inconsistent on the time. She takes her medicine. There are times when she stays in bed most of the morning. "I do not have anything to do that day." At other times she gets up and takes a medicine earlier. As a result, her blood pressure has fluctuated dramatically throughout the day.   Have arranged for her beta blocker to be once daily succinate.  Educated her about its use for BP and palpitations.  She indicates history of murmur but benign  Seen by Dr Delfina Redwood [id number]  Increasing symptoms CHF.  Edema PND and orthopnea for a week.  ECG noted afib.  Toprol increased , started on Lasix / K  And Eliquis    Long discussion with patient about diagnosis , natural history Rx and risk of stroke  This patients CHA2DS2-VASc Score and unadjusted Ischemic Stroke Rate (% per year) is equal to 4.8 % stroke rate/year from a score of 4  Above score calculated as 1 point each if present [CHF, HTN, DM, Vascular=MI/PAD/Aortic Plaque, Age if 65-74, or  Female] Above score calculated as 2 points each if present [Age > 75, or Stroke/TIA/TE]    Labs from 08/29/15 reviewed  TSH 4.0 Cr .9 BNP 400  PLT 215   Past Medical History  Diagnosis Date  . Asthma   . High cholesterol   . GERD (gastroesophageal reflux disease)   . Hypothyroidism   . Arthritis   . Ventricular ectopy   . LBBB (left bundle branch block)     Past Surgical History  Procedure Laterality Date  . Knee arthroscopy       Current Outpatient Prescriptions  Medication Sig Dispense Refill  . apixaban (ELIQUIS) 5 MG TABS tablet Take 5 mg by mouth 2 (two) times daily.    . Ascorbic Acid (VITAMIN C PO) Take 1 tablet by mouth daily.    Marland Kitchen azelastine (ASTELIN) 0.1 % nasal spray Place 1-2 sprays into both nostrils 2 (two) times daily.     . Calcium-Magnesium-Zinc (CALCIUM-MAGNESUIUM-ZINC PO) Take 1 tablet by mouth 3 (three) times daily.     . Cholecalciferol (VITAMIN D PO) Take 1 tablet by mouth daily.     Marland Kitchen denosumab (PROLIA) 60 MG/ML SOLN injection Inject 60 mg into the skin every 6 (six) months. Administer in upper arm, thigh, or abdomen    . furosemide (LASIX) 40 MG tablet Take 40 mg by mouth daily.    Marland Kitchen levothyroxine (SYNTHROID, LEVOTHROID) 88 MCG tablet  Take 88 mcg by mouth daily before breakfast.    . loratadine (CLARITIN) 10 MG tablet Take 10 mg by mouth daily.    Marland Kitchen lovastatin (MEVACOR) 20 MG tablet Take 20 mg by mouth daily at 6 PM.     . metoprolol succinate (TOPROL XL) 25 MG 24 hr tablet Take 1 tablet (25 mg total) by mouth daily. 90 tablet 3  . mometasone-formoterol (DULERA) 100-5 MCG/ACT AERO Inhale 2 puffs into the lungs 2 (two) times daily.    . montelukast (SINGULAIR) 10 MG tablet Take 10 mg by mouth at bedtime.     . Omega-3 Fatty Acids (FISH OIL PO) Take 1 capsule by mouth daily.     Marland Kitchen omeprazole (PRILOSEC) 40 MG capsule Take 40 mg by mouth 2 (two) times daily.     . potassium chloride (K-DUR) 10 MEQ tablet Take 10 mEq by mouth daily.    Marland Kitchen PROAIR HFA 108 (90  Base) MCG/ACT inhaler Inhale 1-2 puffs into the lungs daily as needed. For wheezing & SOB    . sodium chloride (OCEAN) 0.65 % SOLN nasal spray Place 1 spray into both nostrils 2 (two) times daily.     No current facility-administered medications for this visit.    Allergies:   Erythromycin; Neosporin; and Penicillins    Social History:  The patient  reports that she has been passively smoking.  She does not have any smokeless tobacco history on file. She reports that she does not drink alcohol or use illicit drugs.   Family History:  The patient's Family history is unknown by patient.    ROS: All other systems are reviewed and negative. Unless otherwise mentioned in H&P    PHYSICAL EXAM: VS:  BP 128/74 mmHg  Pulse 67  Ht 5\' 2"  (1.575 m)  Wt 69.219 kg (152 lb 9.6 oz)  BMI 27.90 kg/m2  SpO2 95% , BMI Body mass index is 27.9 kg/(m^2). GEN: Well nourished, well developed, in no acute distress HEENT: normal Neck: no JVD, carotid bruits, or masses Cardiac: RRR; no murmurs, rubs, or gallops,no edema  Respiratory:  clear to auscultation bilaterally, normal work of breathing GI: soft, nontender, nondistended, + BS MS: no deformity or atrophy Skin: warm and dry, no rash Neuro:  Strength and sensation are intact Psych: euthymic mood, full affect, slightly anxious   Recent Labs: 01/06/2015: Hemoglobin 12.4; Platelets 224 01/07/2015: ALT 15 01/16/2015: BUN 12; Creatinine, Ser 0.84; Magnesium 2.2; Potassium 4.1; Sodium 138; TSH 2.604    Lipid Panel    Component Value Date/Time   CHOL 173 05/23/2014 0437   TRIG 98 05/23/2014 0437   HDL 49 05/23/2014 0437   CHOLHDL 3.5 05/23/2014 0437   VLDL 20 05/23/2014 0437   LDLCALC 104* 05/23/2014 0437      Wt Readings from Last 3 Encounters:  08/31/15 69.219 kg (152 lb 9.6 oz)  04/13/15 68.584 kg (151 lb 3.2 oz)  02/22/15 69.4 kg (153 lb)    ECG:   2016  SR ICLBBB  PVC  08/31/15  afib rate 90 LAD ICLBBB  afib new since previous ECG    ASSESSMENT AND PLAN:  1. Hypertension: I have spoken with the patient at length about only taking her blood pressure once a day. At the same time everyday. She is also counseled on consistency on medication timing. I have advised her to take it at the same time every day and she will need to be more consistent in this to avoid symptoms. She verbalizes understanding.  I have discussed changing her metoprolol from twice a day dosing to once a day to make it easier for her as she normally goes to bed about the same time every night. She would like to continue the twice a day dosing for now.  2. Afib:  Increase Toprol for rate control Echo TSH ok on labs   [facility] in 3-4 weeks   3. CHF:  Hopefully diastolic from loss of AV synchrony BNP 400   Echo continue rate control and lasix   4. Chest Pain:  Normal myovue 06/2014  EF 62% ok to use AAD post [facility] if recurrence    Current medicines are reviewed at length with the patient today.     Disposition:   FU with me 3-4 weeks to schedule [facility] if event monitor shows persistent afib   Signed, Jenkins Rouge, MD  08/31/2015 8:53 AM    [facility] [id number], [place], [place] [address] Phone: [phone]; Fax: [phone]

## 2015-08-31 ENCOUNTER — Ambulatory Visit (INDEPENDENT_AMBULATORY_CARE_PROVIDER_SITE_OTHER): Payer: Medicare Other | Admitting: Cardiovascular Disease

## 2015-08-31 ENCOUNTER — Encounter: Payer: Self-pay | Admitting: Cardiovascular Disease

## 2015-08-31 VITALS — BP 128/74 | HR 67 | Ht 62.0 in | Wt 152.6 lb

## 2015-08-31 DIAGNOSIS — I10 Essential (primary) hypertension: Secondary | ICD-10-CM | POA: Diagnosis not present

## 2015-08-31 DIAGNOSIS — I4891 Unspecified atrial fibrillation: Secondary | ICD-10-CM | POA: Diagnosis not present

## 2015-08-31 MED ORDER — METOPROLOL SUCCINATE ER 50 MG PO TB24: 50.0000 mg | ORAL_TABLET | Freq: Every day | ORAL | Status: DC

## 2015-08-31 NOTE — Addendum Note (Signed)
Addended by: Aris Georgia, Damonie Furney L on: 08/31/2015 09:02 AM   Modules accepted: Orders

## 2015-08-31 NOTE — Patient Instructions (Addendum)
Medication Instructions:  Your physician has recommended you make the following change in your medication:  1-Increase Metoprolol 50 mg by mouth daily.  Labwork: NONE  Testing/Procedures: Your physician has requested that you have an echocardiogram. Echocardiography is a painless test that uses sound waves to create images of your heart. It provides your doctor with information about the size and shape of your heart and how well your heart's chambers and valves are working. This procedure takes approximately one hour. There are no restrictions for this procedure.  Your physician has recommended that you wear an event monitor. Event monitors are medical devices that record the heart's electrical activity. Doctors most often [place] these monitors to diagnose arrhythmias. Arrhythmias are problems with the speed or rhythm of the heartbeat. The monitor is a small, portable device. You can wear one while you do your normal daily activities. This is usually used to diagnose what is causing palpitations/syncope (passing out).  Follow-Up: Your physician wants you to follow-up in: 4 weeks with PA or Dr. Johnsie Cancel to schedule cardioversion.   If you need a refill on your cardiac medications before your next appointment, please call your pharmacy.

## 2015-09-03 ENCOUNTER — Ambulatory Visit (HOSPITAL_COMMUNITY): Payer: Medicare Other | Attending: Cardiovascular Disease

## 2015-09-03 ENCOUNTER — Other Ambulatory Visit: Payer: Self-pay

## 2015-09-03 DIAGNOSIS — I4891 Unspecified atrial fibrillation: Secondary | ICD-10-CM | POA: Insufficient documentation

## 2015-09-03 DIAGNOSIS — I517 Cardiomegaly: Secondary | ICD-10-CM | POA: Insufficient documentation

## 2015-09-03 DIAGNOSIS — I340 Nonrheumatic mitral (valve) insufficiency: Secondary | ICD-10-CM | POA: Insufficient documentation

## 2015-09-03 DIAGNOSIS — E785 Hyperlipidemia, unspecified: Secondary | ICD-10-CM | POA: Insufficient documentation

## 2015-09-04 ENCOUNTER — Telehealth: Payer: Self-pay | Admitting: Internal Medicine

## 2015-09-04 ENCOUNTER — Ambulatory Visit (INDEPENDENT_AMBULATORY_CARE_PROVIDER_SITE_OTHER): Payer: Medicare Other

## 2015-09-04 ENCOUNTER — Telehealth: Payer: Self-pay

## 2015-09-04 DIAGNOSIS — I4891 Unspecified atrial fibrillation: Secondary | ICD-10-CM | POA: Diagnosis not present

## 2015-09-04 MED ORDER — APIXABAN 5 MG PO TABS: 5.0000 mg | ORAL_TABLET | Freq: Two times a day (BID) | ORAL | Status: DC

## 2015-09-04 NOTE — Telephone Encounter (Signed)
-----   Message from Josue Hector, MD sent at 09/04/2015 11:15 AM EDT ----- EF is low will have to reassess after [facility]

## 2015-09-04 NOTE — Telephone Encounter (Signed)
Patient aware of echo results. Patient needed prescription for her eliquis, so sent in reorder to local pharmacy. Patient stated she will be out of town for over a month. Patient has an appointment with Dr. Johnsie Cancel on 10/24/15 to discuss [facility]. Patient verbalized understanding.

## 2015-09-04 NOTE — Telephone Encounter (Signed)
I received a call from [facility] about Samantha [name]. She was noted to be in Afib with HR ranging from 70-120s.

## 2015-09-05 ENCOUNTER — Telehealth: Payer: Self-pay | Admitting: *Deleted

## 2015-09-05 NOTE — Telephone Encounter (Signed)
Called patient back about message below. Will give patient samples to help until she gets back from her vacation. Also giving patient co-pay card to see if she can use it as well. Patient will come by office and pick up samples tomorrow.

## 2015-09-05 NOTE — Telephone Encounter (Signed)
Samantha [name] is calling because of the cost of Eliquis. Her pharmacy told her to ask Dr. Johnsie Cancel about switching to Warfarin.  She would like a call back to see if Dr, Johnsie Cancel wants to switch or has another medication alternative.  Will forward to [facility] and Dr. Johnsie Cancel.

## 2015-09-10 DIAGNOSIS — H9212 Otorrhea, left ear: Secondary | ICD-10-CM | POA: Insufficient documentation

## 2015-09-10 DIAGNOSIS — H7292 Unspecified perforation of tympanic membrane, left ear: Secondary | ICD-10-CM | POA: Insufficient documentation

## 2015-09-10 DIAGNOSIS — H9190 Unspecified hearing loss, unspecified ear: Secondary | ICD-10-CM | POA: Insufficient documentation

## 2015-09-10 DIAGNOSIS — H6121 Impacted cerumen, right ear: Secondary | ICD-10-CM | POA: Insufficient documentation

## 2015-09-11 ENCOUNTER — Telehealth: Payer: Self-pay | Admitting: Cardiovascular Disease

## 2015-09-11 NOTE — Telephone Encounter (Signed)
Walk in pt form-pt needs note about wearing heart monitor through airport security- Pam P back Wednesday 4/12

## 2015-09-12 NOTE — Telephone Encounter (Signed)
Called patient back about message. Patient is aware Dr. Johnsie Cancel is out of the office this week. Patient was told her PCP could write her a note. Informed patient to let our office know if he cannot and we will on Monday. Patient verbalized understanding.

## 2015-09-14 ENCOUNTER — Telehealth: Payer: Self-pay | Admitting: Cardiovascular Disease

## 2015-09-14 NOTE — Telephone Encounter (Signed)
Left message with patient confirming that her message was received.

## 2015-09-14 NOTE — Telephone Encounter (Signed)
New message   Dr.Stoneking has written a letter for her to get through the security for the airport

## 2015-10-03 ENCOUNTER — Telehealth: Payer: Self-pay | Admitting: Physician Assistant

## 2015-10-03 NOTE — Telephone Encounter (Signed)
Notified by [employer] that patient had pause of 3.1 sec with underlying rhythm of afib at 4:35AM Central Standard time. [employer] called the patient, she was asleep at the time and asymptomatic. Advised to continue monitoring.   Hilbert Corrigan PA Pager: [phone]

## 2015-10-18 ENCOUNTER — Telehealth: Payer: Self-pay | Admitting: Cardiovascular Disease

## 2015-10-18 NOTE — Telephone Encounter (Signed)
F/u   Pt returning RN phone call- monitor results. Please call back and discuss.

## 2015-10-18 NOTE — Telephone Encounter (Signed)
Patient aware of monitor results 

## 2015-10-23 NOTE — Progress Notes (Signed)
Patient ID: PRAPTI FLOW, female   DOB: 1930-02-02, 82 y.o.   MRN: [id number] Cardiology Office Note   Date:  10/23/2015   ID:  Samantha [name], DOB 09-May-1930, MRN [id number]  PCP:  Mathews Argyle, MD  Cardiologist: Katz/ Jenkins Rouge, MD   No chief complaint on file.     History of Present Illness: Samantha [name] is a 82 y.o. female who presents for ongoing assessment of recurrent chest pain, hyperlipidemia, GERD, hypothyroidism. New patient to me   MPI was completed 06/08/2014 that was negative for ischemia, with normal echo. She is here  For follow up for symptoms of syncope and palpitations after addition of lopressor. She had admission for same and is quiet anxious about medications and being at home alone. Labs were ordered.   Na+ 138, K+ 4.1, Mg+ 2.2, TSH 2.604. All labs reviewed and were normal.    Today, still with anxiety about her health. She takes her blood pressure multiple times throughout the day. She is inconsistent on the time. She takes her medicine. There are times when she stays in bed most of the morning. "I do not have anything to do that day." At other times she gets up and takes a medicine earlier. As a result, her blood pressure has fluctuated dramatically throughout the day.   Have arranged for her beta blocker to be once daily succinate.  Educated her about its use for BP and palpitations.  She indicates history of murmur but benign  Seen by Dr Delfina Redwood Increasing symptoms CHF.  Edema PND and orthopnea for a week.  ECG noted afib.  Toprol increased , started on Lasix / K  And Eliquis    Long discussion with patient about diagnosis , natural history Rx and risk of stroke  This patients CHA2DS2-VASc Score and unadjusted Ischemic Stroke Rate (% per year) is equal to 4.8 % stroke rate/year from a score of 4  Above score calculated as 1 point each if present [CHF, HTN, DM, Vascular=MI/PAD/Aortic Plaque, Age if 65-74, or Female] Above score calculated as 2  points each if present [Age > 75, or Stroke/TIA/TE]    Labs from 08/29/15 reviewed  TSH 4.0 Cr .9 BNP 400  PLT 215   Echo showed EF 35-40%    Monitor showed persistant afib. Came in to talk about [facility]  However her son Samantha [name] age 55 was just diagnosed with stage 4 lung Cancer with brain mets. Getting XRT She has has fatigue and dyspnea   Past Medical History  Diagnosis Date  . Asthma   . High cholesterol   . GERD (gastroesophageal reflux disease)   . Hypothyroidism   . Arthritis   . Ventricular ectopy   . LBBB (left bundle branch block)     Past Surgical History  Procedure Laterality Date  . Knee arthroscopy       Current Outpatient Prescriptions  Medication Sig Dispense Refill  . apixaban (ELIQUIS) 5 MG TABS tablet Take 1 tablet (5 mg total) by mouth 2 (two) times daily. 60 tablet 11  . Ascorbic Acid (VITAMIN C PO) Take 1 tablet by mouth daily.    Marland Kitchen azelastine (ASTELIN) 0.1 % nasal spray Place 1-2 sprays into both nostrils 2 (two) times daily.     . Calcium-Magnesium-Zinc (CALCIUM-MAGNESUIUM-ZINC PO) Take 1 tablet by mouth 3 (three) times daily.     . Cholecalciferol (VITAMIN D PO) Take 1 tablet by mouth daily.     Marland Kitchen denosumab (PROLIA) 60 MG/ML SOLN injection  Inject 60 mg into the skin every 6 (six) months. Administer in upper arm, thigh, or abdomen    . furosemide (LASIX) 40 MG tablet Take 40 mg by mouth daily.    Marland Kitchen levothyroxine (SYNTHROID, LEVOTHROID) 88 MCG tablet Take 88 mcg by mouth daily before breakfast.    . loratadine (CLARITIN) 10 MG tablet Take 10 mg by mouth daily.    Marland Kitchen lovastatin (MEVACOR) 20 MG tablet Take 20 mg by mouth daily at 6 PM.     . metoprolol succinate (TOPROL-XL) 50 MG 24 hr tablet Take 1 tablet (50 mg total) by mouth daily. 90 tablet 3  . mometasone-formoterol (DULERA) 100-5 MCG/ACT AERO Inhale 2 puffs into the lungs 2 (two) times daily.    . montelukast (SINGULAIR) 10 MG tablet Take 10 mg by mouth at bedtime.     . Omega-3 Fatty Acids (FISH OIL PO)  Take 1 capsule by mouth daily.     Marland Kitchen omeprazole (PRILOSEC) 40 MG capsule Take 40 mg by mouth 2 (two) times daily.     . potassium chloride (K-DUR) 10 MEQ tablet Take 10 mEq by mouth daily.    Marland Kitchen PROAIR HFA 108 (90 Base) MCG/ACT inhaler Inhale 1-2 puffs into the lungs daily as needed. For wheezing & SOB    . sodium chloride (OCEAN) 0.65 % SOLN nasal spray Place 1 spray into both nostrils 2 (two) times daily.     No current facility-administered medications for this visit.    Allergies:   Erythromycin; Neosporin; and Penicillins    Social History:  The patient  reports that she has been passively smoking.  She does not have any smokeless tobacco history on file. She reports that she does not drink alcohol or use illicit drugs.   Family History:  The patient's Family history is unknown by patient.    ROS: All other systems are reviewed and negative. Unless otherwise mentioned in H&P    PHYSICAL EXAM: VS:  There were no vitals taken for this visit. , BMI There is no weight on file to calculate BMI. GEN: Well nourished, well developed, in no acute distress HEENT: normal Neck: no JVD, carotid bruits, or masses Cardiac: RRR; no murmurs, rubs, or gallops,no edema  Respiratory:  clear to auscultation bilaterally, normal work of breathing GI: soft, nontender, nondistended, + BS MS: no deformity or atrophy Skin: warm and dry, no rash Neuro:  Strength and sensation are intact Psych: euthymic mood, full affect, slightly anxious   Recent Labs: 01/06/2015: Hemoglobin 12.4; Platelets 224 01/07/2015: ALT 15 01/16/2015: BUN 12; Creatinine, Ser 0.84; Magnesium 2.2; Potassium 4.1; Sodium 138; TSH 2.604    Lipid Panel    Component Value Date/Time   CHOL 173 05/23/2014 0437   TRIG 98 05/23/2014 0437   HDL 49 05/23/2014 0437   CHOLHDL 3.5 05/23/2014 0437   VLDL 20 05/23/2014 0437   LDLCALC 104* 05/23/2014 0437      Wt Readings from Last 3 Encounters:  08/31/15 69.219 kg (152 lb 9.6 oz)   04/13/15 68.584 kg (151 lb 3.2 oz)  02/22/15 69.4 kg (153 lb)    ECG:   2016  SR ICLBBB  PVC  08/31/15  afib rate 90 LAD ICLBBB  afib new since previous ECG   ASSESSMENT AND PLAN:  1. Hypertension: better contorl   2. Afib:  Better rate control with Toprol   Echo TSH ok on labs   Planned on scheduling [facility] but with sons new diagnosis of cancer Will postpone as  she is helping Butch Penny (daughter in law ) out   3. CHF:  Hopefully diastolic from loss of AV synchrony BNP 400  EF 35-40% by echo   4. Chest Pain:  Normal myovue 06/2014  EF 62%  May need re evaluation given decreased EF    Current medicines are reviewed at length with the patient today.     Disposition:   FU with me 3-4 weeks to schedule [facility] once family issues settled   Signed, Jenkins Rouge, MD  10/23/2015 10:30 AM    [facility] [id number], [place], [place] [address] Phone: [phone]; Fax: [phone]

## 2015-10-24 ENCOUNTER — Encounter: Payer: Self-pay | Admitting: Cardiovascular Disease

## 2015-10-24 ENCOUNTER — Ambulatory Visit (INDEPENDENT_AMBULATORY_CARE_PROVIDER_SITE_OTHER): Payer: Medicare Other | Admitting: Cardiovascular Disease

## 2015-10-24 VITALS — BP 100/60 | HR 88 | Ht 62.0 in | Wt 147.8 lb

## 2015-10-24 DIAGNOSIS — I447 Left bundle-branch block, unspecified: Secondary | ICD-10-CM | POA: Diagnosis not present

## 2015-10-24 DIAGNOSIS — Z79899 Other long term (current) drug therapy: Secondary | ICD-10-CM

## 2015-10-24 MED ORDER — FUROSEMIDE 40 MG PO TABS: 40.0000 mg | ORAL_TABLET | Freq: Every day | ORAL | Status: DC

## 2015-10-24 MED ORDER — APIXABAN 5 MG PO TABS: 5.0000 mg | ORAL_TABLET | Freq: Two times a day (BID) | ORAL | Status: DC

## 2015-10-24 MED ORDER — POTASSIUM CHLORIDE ER 10 MEQ PO TBCR: 10.0000 meq | EXTENDED_RELEASE_TABLET | Freq: Every day | ORAL | Status: AC

## 2015-10-24 NOTE — Patient Instructions (Addendum)
Medication Instructions:  Your physician recommends that you continue on your current medications as directed. Please refer to the Current Medication list given to you today.  Labwork: Your physician recommends that you have lab work today BMET  Testing/Procedures: NONE  Follow-Up: Your physician wants you to follow-up next available with Dr. Johnsie Cancel.  If you need a refill on your cardiac medications before your next appointment, please call your pharmacy.

## 2015-10-25 ENCOUNTER — Telehealth: Payer: Self-pay

## 2015-10-25 ENCOUNTER — Other Ambulatory Visit: Payer: Self-pay | Admitting: *Deleted

## 2015-10-25 LAB — BASIC METABOLIC PANEL
BUN: 14 mg/dL (ref 7–25)
CHLORIDE: 101 mmol/L (ref 98–110)
CO2: 25 mmol/L (ref 20–31)
CREATININE: 1.07 mg/dL — AB (ref 0.60–0.88)
Calcium: 8.9 mg/dL (ref 8.6–10.4)
Glucose, Bld: 93 mg/dL (ref 65–99)
Potassium: 3.9 mmol/L (ref 3.5–5.3)
Sodium: 138 mmol/L (ref 135–146)

## 2015-10-25 MED ORDER — APIXABAN 5 MG PO TABS: 5.0000 mg | ORAL_TABLET | Freq: Two times a day (BID) | ORAL | Status: DC

## 2015-10-25 NOTE — Telephone Encounter (Signed)
Prior auth for Eliquis 5mg submitted to [facility]. 

## 2015-10-25 NOTE — Telephone Encounter (Deleted)
Tier exception for Olmesartan obtained from [facility]. It is approved for a Tier 1 co-pay. Pharmacy notified.

## 2015-10-26 ENCOUNTER — Telehealth: Payer: Self-pay | Admitting: Cardiovascular Disease

## 2015-10-26 ENCOUNTER — Telehealth: Payer: Self-pay

## 2015-10-26 NOTE — Telephone Encounter (Signed)
Walk in pt form-letter dropped off-gave to Pam P

## 2015-10-26 NOTE — Telephone Encounter (Signed)
Eliquis 5mg  approved through 06/01/2016. [id number].

## 2015-11-12 NOTE — Progress Notes (Signed)
Patient ID: Samantha [name], female   DOB: 02/15/1930, 82 y.o.   MRN: [id number] Cardiology Office Note   Date:  11/13/2015   ID:  Samantha [name], DOB 06/21/1929, MRN [id number]  PCP:  Mathews Argyle, MD  Cardiologist: Katz/ Jenkins Rouge, MD   Chief Complaint  Patient presents with  . Hypertension  . LBBB      History of Present Illness: Samantha [name] is a 82 y.o. female who presents for ongoing assessment of recurrent chest pain, hyperlipidemia, GERD, hypothyroidism.   Seen by Dr Delfina Redwood Increasing symptoms CHF.  Edema PND and orthopnea  ECG noted afib.  Toprol increased , started on Lasix / K  And Eliquis    Long discussion with patient about diagnosis , natural history Rx and risk of stroke  This patients CHA2DS2-VASc Score and unadjusted Ischemic Stroke Rate (% per year) is equal to 4.8 % stroke rate/year from a score of 4  Above score calculated as 1 point each if present [CHF, HTN, DM, Vascular=MI/PAD/Aortic Plaque, Age if 65-74, or Female] Above score calculated as 2 points each if present [Age > 75, or Stroke/TIA/TE]    Labs from 08/29/15 reviewed  TSH 4.0 Cr .9 BNP 400  PLT 215   Echo showed EF 35-40%    Monitor showed persistant afib. Came in to talk about [facility]  However her son Samantha [name] age 28 was diagnosed with brain cancer He passed on 10/02/22 12/05/15  She is coping ok and staying with her daughter in law  She is ok to proceed with [facility] in next couple of weeks    Risks including stroke discussed Benefit in regard to rate control stroke prevention and CHF also discussed   Past Medical History  Diagnosis Date  . Asthma   . High cholesterol   . GERD (gastroesophageal reflux disease)   . Hypothyroidism   . Arthritis   . Ventricular ectopy   . LBBB (left bundle branch block)     Past Surgical History  Procedure Laterality Date  . Knee arthroscopy       Current Outpatient Prescriptions  Medication Sig Dispense Refill  . apixaban (ELIQUIS) 5 MG TABS tablet  Take 1 tablet (5 mg total) by mouth 2 (two) times daily. 30 tablet 0  . Ascorbic Acid (VITAMIN C PO) Take 1 tablet by mouth daily.    Marland Kitchen azelastine (ASTELIN) 0.1 % nasal spray Place 1-2 sprays into both nostrils 2 (two) times daily.     . Calcium-Magnesium-Zinc (CALCIUM-MAGNESUIUM-ZINC PO) Take 1 tablet by mouth 3 (three) times daily.     . Cholecalciferol (VITAMIN D PO) Take 1 tablet by mouth daily.     Marland Kitchen denosumab (PROLIA) 60 MG/ML SOLN injection Inject 60 mg into the skin every 6 (six) months. Administer in upper arm, thigh, or abdomen    . furosemide (LASIX) 40 MG tablet Take 1 tablet (40 mg total) by mouth daily. 90 tablet 3  . levothyroxine (SYNTHROID, LEVOTHROID) 88 MCG tablet Take 88 mcg by mouth daily before breakfast.    . loratadine (CLARITIN) 10 MG tablet Take 10 mg by mouth daily.    Marland Kitchen lovastatin (MEVACOR) 20 MG tablet Take 20 mg by mouth daily at 6 PM.     . metoprolol succinate (TOPROL-XL) 50 MG 24 hr tablet Take 1 tablet (50 mg total) by mouth daily. 90 tablet 3  . mometasone-formoterol (DULERA) 100-5 MCG/ACT AERO Inhale 2 puffs into the lungs 2 (two) times daily.    . montelukast (  SINGULAIR) 10 MG tablet Take 10 mg by mouth at bedtime.     . Multiple Vitamins-Minerals (VISION-VITE PRESERVE PO) Take 1 tablet by mouth 2 (two) times daily.    . Omega-3 Fatty Acids (FISH OIL PO) Take 1 capsule by mouth daily.     Marland Kitchen omeprazole (PRILOSEC) 40 MG capsule Take 40 mg by mouth 2 (two) times daily.     . potassium chloride (K-DUR) 10 MEQ tablet Take 1 tablet (10 mEq total) by mouth daily. 90 tablet 3  . PROAIR HFA 108 (90 Base) MCG/ACT inhaler Inhale 1-2 puffs into the lungs daily as needed. For wheezing & SOB    . sodium chloride (OCEAN) 0.65 % SOLN nasal spray Place 1 spray into both nostrils 2 (two) times daily.     No current facility-administered medications for this visit.    Allergies:   Erythromycin; Benzalkonium chloride; Neosporin; and Penicillins    Social History:  The  patient  reports that she has been passively smoking.  She does not have any smokeless tobacco history on file. She reports that she does not drink alcohol or use illicit drugs.   Family History:  The patient's Family history is unknown by patient.    ROS: All other systems are reviewed and negative. Unless otherwise mentioned in H&P    PHYSICAL EXAM: VS:  BP 118/74 mmHg  Pulse 82  Ht 5\' 2"  (1.575 m)  Wt 65.953 kg (145 lb 6.4 oz)  BMI 26.59 kg/m2  SpO2 97% , BMI Body mass index is 26.59 kg/(m^2). GEN: Well nourished, well developed, in no acute distress HEENT: normal Neck: no JVD, carotid bruits, or masses Cardiac: RRR; no murmurs, rubs, or gallops,no edema  Respiratory:  clear to auscultation bilaterally, normal work of breathing GI: soft, nontender, nondistended, + BS MS: no deformity or atrophy Skin: warm and dry, no rash Neuro:  Strength and sensation are intact Psych: euthymic mood, full affect, slightly anxious   Recent Labs: 01/06/2015: Hemoglobin 12.4; Platelets 224 01/07/2015: ALT 15 01/16/2015: Magnesium 2.2; TSH 2.604 10/24/2015: BUN 14; Creat 1.07*; Potassium 3.9; Sodium 138    Lipid Panel    Component Value Date/Time   CHOL 173 05/23/2014 0437   TRIG 98 05/23/2014 0437   HDL 49 05/23/2014 0437   CHOLHDL 3.5 05/23/2014 0437   VLDL 20 05/23/2014 0437   LDLCALC 104* 05/23/2014 0437      Wt Readings from Last 3 Encounters:  11/13/15 65.953 kg (145 lb 6.4 oz)  10/24/15 67.042 kg (147 lb 12.8 oz)  08/31/15 69.219 kg (152 lb 9.6 oz)    ECG:   2016  SR ICLBBB  PVC  08/31/15  afib rate 90 LAD ICLBBB  afib new since previous ECG   ASSESSMENT AND PLAN:  1. Hypertension: Well controlled.  Continue current medications and low sodium Dash type diet.    2. Afib:  Better rate control with Toprol   Echo TSH ok on labs  Plan [facility] opn 6/27  . She has not missed any doses of eliquis She will need labs day before procedure Have called endo and scheduled for 6/27 2:00  Orders  written   3. CHF: better will benefit from restoration of AV synchrony  EF 35-40% .  Likely need stress myovue after [place]   4. Chest Pain:  Normal myovue 06/2014  EF 62%  May need re evaluation given decreased EF    Current medicines are reviewed at length with the patient today.  Signed, Jenkins Rouge, MD  11/13/2015 11:30 AM    [facility] [address], [place], [place] [address] Phone: [phone]; Fax: [phone]

## 2015-11-13 ENCOUNTER — Encounter: Payer: Self-pay | Admitting: Cardiovascular Disease

## 2015-11-13 ENCOUNTER — Ambulatory Visit (INDEPENDENT_AMBULATORY_CARE_PROVIDER_SITE_OTHER): Payer: Medicare Other | Admitting: Cardiovascular Disease

## 2015-11-13 ENCOUNTER — Other Ambulatory Visit: Payer: Self-pay

## 2015-11-13 VITALS — BP 118/74 | HR 82 | Ht 62.0 in | Wt 145.4 lb

## 2015-11-13 DIAGNOSIS — I10 Essential (primary) hypertension: Secondary | ICD-10-CM

## 2015-11-13 DIAGNOSIS — I447 Left bundle-branch block, unspecified: Secondary | ICD-10-CM | POA: Diagnosis not present

## 2015-11-13 DIAGNOSIS — Z01812 Encounter for preprocedural laboratory examination: Secondary | ICD-10-CM | POA: Diagnosis not present

## 2015-11-13 NOTE — Patient Instructions (Addendum)
Medication Instructions:  Your physician recommends that you continue on your current medications as directed. Please refer to the Current Medication list given to you today.  Labwork: Your physician recommends that you return for lab work on November 26, 2015 for BMET, CBC, and PT/INR  Testing/Procedures: Your physician has requested that you have a cardioversion.    Follow-Up: Your physician wants you to follow-up as directed with Dr. Johnsie Cancel after cardioversion.   If you need a refill on your cardiac medications before your next appointment, please call your pharmacy.    Electrical Cardioversion Electrical cardioversion is the delivery of a jolt of electricity to change the rhythm of the heart. Sticky patches or metal paddles are placed on the chest to deliver the electricity from a device. This is done to restore a normal rhythm. A rhythm that is too fast or not regular keeps the heart from pumping well. Electrical cardioversion is done in an emergency if:   There is low or no blood pressure as a result of the heart rhythm.   Normal rhythm must be restored as fast as possible to protect the brain and heart from further damage.   It may save a life. Cardioversion may be done for heart rhythms that are not immediately life threatening, such as atrial fibrillation or flutter, in which:   The heart is beating too fast or is not regular.   Medicine to change the rhythm has not worked.   It is safe to wait in order to allow time for preparation.  Symptoms of the abnormal rhythm are bothersome.  The risk of stroke and other serious problems can be reduced. LET [facility] CARE PROVIDER KNOW ABOUT:   Any allergies you have.  All medicines you are taking, including vitamins, herbs, eye drops, creams, and over-the-counter medicines.  Previous problems you or members of your family have had with the use of anesthetics.   Any blood disorders you have.   Previous surgeries you  have had.   Medical conditions you have. RISKS AND COMPLICATIONS  Generally, this is a safe procedure. However, problems can occur and include:   Breathing problems related to the anesthetic used.  A blood clot that breaks free and travels to other parts of your body. This could cause a stroke or other problems. The risk of this is lowered by use of blood-thinning medicine (anticoagulant) prior to the procedure.  Cardiac arrest (rare). BEFORE THE PROCEDURE   You may have tests to detect blood clots in your heart and to evaluate heart function.  You may start taking anticoagulants so your blood does not clot as easily.   Medicines may be given to help stabilize your heart rate and rhythm. PROCEDURE  You will be given medicine through an IV tube to reduce discomfort and make you sleepy (sedative).   An electrical shock will be delivered. AFTER THE PROCEDURE Your heart rhythm will be watched to make sure it does not change.    This information is not intended to replace advice given to you by your health care provider. Make sure you discuss any questions you have with your health care provider.   Document Released: 05/09/2002 Document Revised: 06/09/2014 Document Reviewed: 12/01/2012 Elsevier Interactive Patient Education [employer].

## 2015-11-26 ENCOUNTER — Other Ambulatory Visit: Payer: Self-pay | Admitting: Cardiovascular Disease

## 2015-11-26 ENCOUNTER — Other Ambulatory Visit (INDEPENDENT_AMBULATORY_CARE_PROVIDER_SITE_OTHER): Payer: Medicare Other | Admitting: *Deleted

## 2015-11-26 DIAGNOSIS — Z01812 Encounter for preprocedural laboratory examination: Secondary | ICD-10-CM | POA: Diagnosis not present

## 2015-11-26 DIAGNOSIS — I447 Left bundle-branch block, unspecified: Secondary | ICD-10-CM | POA: Diagnosis not present

## 2015-11-26 DIAGNOSIS — I10 Essential (primary) hypertension: Secondary | ICD-10-CM | POA: Diagnosis not present

## 2015-11-26 DIAGNOSIS — I480 Paroxysmal atrial fibrillation: Secondary | ICD-10-CM

## 2015-11-26 NOTE — Addendum Note (Signed)
Addended by: Eulis Foster on: 11/26/2015 01:50 PM   Modules accepted: Orders

## 2015-11-27 ENCOUNTER — Ambulatory Visit (HOSPITAL_COMMUNITY): Payer: Medicare Other | Admitting: Anesthesiology

## 2015-11-27 ENCOUNTER — Encounter (HOSPITAL_COMMUNITY)
Admission: RE | Disposition: A | Discharge status: Home / Self Care | Payer: Self-pay | Source: Ambulatory Visit | Attending: Cardiovascular Disease

## 2015-11-27 ENCOUNTER — Ambulatory Visit (HOSPITAL_COMMUNITY)
Admission: RE | Admit: 2015-11-27 | Discharge: 2015-11-27 | Disposition: A | Discharge status: Home / Self Care | Payer: Medicare Other | Source: Ambulatory Visit | Attending: Cardiovascular Disease | Admitting: Cardiovascular Disease

## 2015-11-27 ENCOUNTER — Encounter (HOSPITAL_COMMUNITY): Payer: Self-pay | Admitting: Cardiovascular Disease

## 2015-11-27 ENCOUNTER — Other Ambulatory Visit: Payer: Self-pay | Admitting: Cardiovascular Disease

## 2015-11-27 DIAGNOSIS — E7800 Pure hypercholesterolemia, unspecified: Secondary | ICD-10-CM | POA: Diagnosis not present

## 2015-11-27 DIAGNOSIS — E039 Hypothyroidism, unspecified: Secondary | ICD-10-CM | POA: Diagnosis not present

## 2015-11-27 DIAGNOSIS — K219 Gastro-esophageal reflux disease without esophagitis: Secondary | ICD-10-CM | POA: Diagnosis not present

## 2015-11-27 DIAGNOSIS — Z7983 Long term (current) use of bisphosphonates: Secondary | ICD-10-CM | POA: Insufficient documentation

## 2015-11-27 DIAGNOSIS — I481 Persistent atrial fibrillation: Secondary | ICD-10-CM | POA: Diagnosis not present

## 2015-11-27 DIAGNOSIS — Z79899 Other long term (current) drug therapy: Secondary | ICD-10-CM | POA: Insufficient documentation

## 2015-11-27 DIAGNOSIS — I480 Paroxysmal atrial fibrillation: Secondary | ICD-10-CM

## 2015-11-27 DIAGNOSIS — I509 Heart failure, unspecified: Secondary | ICD-10-CM | POA: Diagnosis not present

## 2015-11-27 DIAGNOSIS — Z7901 Long term (current) use of anticoagulants: Secondary | ICD-10-CM | POA: Insufficient documentation

## 2015-11-27 DIAGNOSIS — Z7951 Long term (current) use of inhaled steroids: Secondary | ICD-10-CM | POA: Diagnosis not present

## 2015-11-27 DIAGNOSIS — I110 Hypertensive heart disease with heart failure: Secondary | ICD-10-CM | POA: Diagnosis not present

## 2015-11-27 DIAGNOSIS — J449 Chronic obstructive pulmonary disease, unspecified: Secondary | ICD-10-CM | POA: Insufficient documentation

## 2015-11-27 DIAGNOSIS — I4891 Unspecified atrial fibrillation: Secondary | ICD-10-CM | POA: Diagnosis not present

## 2015-11-27 HISTORY — PX: CARDIOVERSION: SHX1299

## 2015-11-27 LAB — BASIC METABOLIC PANEL
BUN: 22 mg/dL (ref 7–25)
CHLORIDE: 104 mmol/L (ref 98–110)
CO2: 25 mmol/L (ref 20–31)
Calcium: 9.2 mg/dL (ref 8.6–10.4)
Creat: 1.04 mg/dL — ABNORMAL HIGH (ref 0.60–0.88)
GLUCOSE: 111 mg/dL — AB (ref 65–99)
POTASSIUM: 3.8 mmol/L (ref 3.5–5.3)
SODIUM: 140 mmol/L (ref 135–146)

## 2015-11-27 LAB — CBC WITH DIFFERENTIAL/PLATELET
Basophils Absolute: 0 cells/uL (ref 0–200)
Basophils Relative: 0 %
EOS PCT: 2 %
Eosinophils Absolute: 188 cells/uL (ref 15–500)
HEMATOCRIT: 38.7 % (ref 35.0–45.0)
Hemoglobin: 12.4 g/dL (ref 11.7–15.5)
LYMPHS PCT: 23 %
Lymphs Abs: 2162 cells/uL (ref 850–3900)
MCH: 28.4 pg (ref 27.0–33.0)
MCHC: 32 g/dL (ref 32.0–36.0)
MCV: 88.6 fL (ref 80.0–100.0)
MPV: 10.9 fL (ref 7.5–12.5)
Monocytes Absolute: 1128 cells/uL — ABNORMAL HIGH (ref 200–950)
Monocytes Relative: 12 %
NEUTROS ABS: 5922 {cells}/uL (ref 1500–7800)
NEUTROS PCT: 63 %
Platelets: 268 10*3/uL (ref 140–400)
RBC: 4.37 MIL/uL (ref 3.80–5.10)
RDW: 15 % (ref 11.0–15.0)
WBC: 9.4 10*3/uL (ref 3.8–10.8)

## 2015-11-27 LAB — PROTIME-INR
INR: 1.1
PROTHROMBIN TIME: 11.2 s (ref 9.0–11.5)

## 2015-11-27 SURGERY — CARDIOVERSION
Anesthesia: Monitor Anesthesia Care

## 2015-11-27 MED ORDER — SODIUM CHLORIDE 0.9% FLUSH: 3.0000 mL | INTRAVENOUS | Status: DC | PRN

## 2015-11-27 MED ORDER — SODIUM CHLORIDE 0.9 % IV SOLN
250.0000 mL | INTRAVENOUS | Status: DC
  Administered 2015-11-27: 500 mL via INTRAVENOUS

## 2015-11-27 MED ORDER — LIDOCAINE HCL (CARDIAC) 20 MG/ML IV SOLN
INTRAVENOUS | Status: DC | PRN
  Administered 2015-11-27: 20 mg via INTRATRACHEAL

## 2015-11-27 MED ORDER — SODIUM CHLORIDE 0.9% FLUSH: 3.0000 mL | Freq: Two times a day (BID) | INTRAVENOUS | Status: DC

## 2015-11-27 MED ORDER — AMIODARONE IV BOLUS ONLY 150 MG/100ML
150.0000 mg | INTRAVENOUS | Status: AC
  Administered 2015-11-27: 150 mg via INTRAVENOUS
  Filled 2015-11-27 (×2): qty 100

## 2015-11-27 MED ORDER — AMIODARONE HCL 200 MG PO TABS: 200.0000 mg | ORAL_TABLET | Freq: Two times a day (BID) | ORAL | Status: DC

## 2015-11-27 MED ORDER — PROPOFOL 10 MG/ML IV BOLUS
INTRAVENOUS | Status: DC | PRN
  Administered 2015-11-27: 50 mg via INTRAVENOUS

## 2015-11-27 MED ORDER — AMIODARONE LOAD VIA INFUSION: 150.0000 mg | Freq: Once | INTRAVENOUS | Status: DC

## 2015-11-27 NOTE — Anesthesia Preprocedure Evaluation (Addendum)
Anesthesia Evaluation  Patient identified by MRN, date of birth, ID band Patient awake    Reviewed: Allergy & Precautions, NPO status , Patient's Chart, lab work & pertinent test results, reviewed documented beta blocker date and time   History of Anesthesia Complications Negative for: history of anesthetic complications  Airway Mallampati: II  TM Distance: >3 FB Neck ROM: Full    Dental  (+) Upper Dentures, Lower Dentures   Pulmonary asthma , COPD,  COPD inhaler,    breath sounds clear to auscultation       Cardiovascular hypertension, Pt. on home beta blockers and Pt. on medications (-) angina+ dysrhythmias Atrial Fibrillation  Rhythm:Irregular Rate:Normal  4/17 ECHO: EF 35-40%, mod MR '16 Stress:  EF 62%, no perfusion defects   Neuro/Psych negative neurological ROS     GI/Hepatic Neg liver ROS, GERD  Medicated and Controlled,  Endo/Other  Hypothyroidism   Renal/GU negative Renal ROS     Musculoskeletal  (+) Arthritis , Osteoarthritis,    Abdominal   Peds  Hematology  (+) Blood dyscrasia (eliquis), ,   Anesthesia Other Findings   Reproductive/Obstetrics                          Anesthesia Physical Anesthesia Plan  ASA: III  Anesthesia Plan: MAC   Post-op Pain Management:    Induction: Intravenous  Airway Management Planned: Mask  Additional Equipment:   Intra-op Plan:   Post-operative Plan:   Informed Consent: I have reviewed the patients History and Physical, chart, labs and discussed the procedure including the risks, benefits and alternatives for the proposed anesthesia with the patient or authorized representative who has indicated his/her understanding and acceptance.   Dental advisory given  Plan Discussed with: CRNA and Surgeon  Anesthesia Plan Comments: (Plan routine monitors, MAC)        Anesthesia Quick Evaluation

## 2015-11-27 NOTE — Interval H&P Note (Signed)
History and Physical Interval Note:  11/27/2015 10:02 AM  [name]  has presented today for surgery, with the diagnosis of A FIB   The various methods of treatment have been discussed with the patient and family. After consideration of risks, benefits and other options for treatment, the patient has consented to  Procedure(s): CARDIOVERSION (N/A) as a surgical intervention .  The patient's history has been reviewed, patient examined, no change in status, stable for surgery.  I have reviewed the patient's chart and labs.  Questions were answered to the patient's satisfaction.     Jenkins Rouge

## 2015-11-27 NOTE — Transfer of Care (Signed)
Immediate Anesthesia Transfer of Care Note  Patient: Samantha [name]  Procedure(s) Performed: Procedure(s): CARDIOVERSION (N/A)  Patient Location: Endoscopy Unit  Anesthesia Type:MAC  Level of Consciousness: awake, alert  and oriented  Airway & Oxygen Therapy: Patient Spontanous Breathing and Patient connected to nasal cannula oxygen  Post-op Assessment: Report given to RN, Post -op Vital signs reviewed and stable and Patient moving all extremities X 4  Post vital signs: Reviewed and stable  Last Vitals:  Filed Vitals:   11/27/15 1241  BP: 126/68  Pulse: 80  Temp: 36.3 C  Resp: 14    Last Pain: There were no vitals filed for this visit.       Complications: No apparent anesthesia complications

## 2015-11-27 NOTE — Discharge Instructions (Signed)
Electrical Cardioversion, Care After Refer to this sheet in the next few weeks. These instructions provide you with information on caring for yourself after your procedure. Your health care provider may also give you more specific instructions. Your treatment has been planned according to current medical practices, but problems sometimes occur. Call your health care provider if you have any problems or questions after your procedure. WHAT TO EXPECT AFTER THE PROCEDURE After your procedure, it is typical to have the following sensations:  Some redness on the skin where the shocks were delivered. If this is tender, a sunburn lotion or hydrocortisone cream may help.  Possible return of an abnormal heart rhythm within hours or days after the procedure. HOME CARE INSTRUCTIONS  Take medicines only as directed by your health care provider. Be sure you understand how and when to take your medicine.  Learn how to feel your pulse and check it often.  Limit your activity for 48 hours after the procedure or as directed by your health care provider.  Avoid or minimize caffeine and other stimulants as directed by your health care provider. SEEK MEDICAL CARE IF:  You feel like your heart is beating too fast or your pulse is not regular.  You have any questions about your medicines.  You have bleeding that will not stop. SEEK IMMEDIATE MEDICAL CARE IF:  You are dizzy or feel faint.  It is hard to breathe or you feel short of breath.  There is a change in discomfort in your chest.  Your speech is slurred or you have trouble moving an arm or leg on one side of your body.  You get a serious muscle cramp that does not go away.  Your fingers or toes turn cold or blue.   This information is not intended to replace advice given to you by your health care provider. Make sure you discuss any questions you have with your health care provider.   Document Released: 03/09/2013 Document Revised: 06/09/2014  Document Reviewed: 03/09/2013 Elsevier Interactive Patient Education 2016 [place] amiodarone 200 bid F/U Dr Johnsie Cancel 3-4 weeks office will call

## 2015-11-27 NOTE — Anesthesia Procedure Notes (Signed)
Procedure Name: MAC Date/Time: 11/27/2015 1:41 PM Performed by: Mariea Clonts Pre-anesthesia Checklist: Patient identified, Emergency Drugs available, Suction available, Patient being monitored and Timeout performed Patient Re-evaluated:Patient Re-evaluated prior to inductionOxygen Delivery Method: Ambu bag Preoxygenation: Pre-oxygenation with 100% oxygen Intubation Type: IV induction

## 2015-11-27 NOTE — CV Procedure (Signed)
[facility] Anesthesia Dr Glennon Mac  [facility] x 4 120 150 200 x2  Converts to NSR then reverts back to afib/flutter  Amiodarone 150 mg bolus given  Continue eliquis will load with PO amiodarone And gvie 21 day event monitor if mostly afib Repeat DCC in 6-8 weeks  Jenkins Rouge

## 2015-11-27 NOTE — Anesthesia Postprocedure Evaluation (Signed)
Anesthesia Post Note  Patient: Samantha [name]  Procedure(s) Performed: Procedure(s) (LRB): CARDIOVERSION (N/A)  Patient location during evaluation: Endoscopy Anesthesia Type: MAC Level of consciousness: awake and alert and oriented Pain management: satisfactory to patient Vital Signs Assessment: vitals unstable and post-procedure vital signs reviewed and stable Respiratory status: spontaneous breathing and patient connected to nasal cannula oxygen Cardiovascular status: blood pressure returned to baseline and stable Postop Assessment: no headache Anesthetic complications: no    Last Vitals:  Filed Vitals:   11/27/15 1241  BP: 126/68  Pulse: 80  Temp: 36.3 C  Resp: 14    Last Pain: There were no vitals filed for this visit.               [facility]

## 2015-11-27 NOTE — Progress Notes (Signed)
Dr. Johnsie Cancel notified patient received Amiodarone loading dose IV per order. Advise patient is sinus brady with rate in 50s alternating with atrial fibrillation. Per Dr. Johnsie Cancel patient ok to discharge, she is to start PO amiodarone tomorrow as ordered and will be notified by Dr. Johnsie Cancel office to schedule follow up appointment.

## 2015-11-27 NOTE — H&P (View-Only) (Signed)
Patient ID: Samantha [name], female   DOB: 12-Aug-1929, 82 y.o.   MRN: [id number] Cardiology Office Note   Date:  11/13/2015   ID:  Samantha [name], DOB 07-Feb-1930, MRN [id number]  PCP:  Mathews Argyle, MD  Cardiologist: Katz/ Jenkins Rouge, MD   Chief Complaint  Patient presents with  . Hypertension  . LBBB      History of Present Illness: Samantha [name] is a 82 y.o. female who presents for ongoing assessment of recurrent chest pain, hyperlipidemia, GERD, hypothyroidism.   Seen by Dr Delfina Redwood Increasing symptoms CHF.  Edema PND and orthopnea  ECG noted afib.  Toprol increased , started on Lasix / K  And Eliquis    Long discussion with patient about diagnosis , natural history Rx and risk of stroke  This patients CHA2DS2-VASc Score and unadjusted Ischemic Stroke Rate (% per year) is equal to 4.8 % stroke rate/year from a score of 4  Above score calculated as 1 point each if present [CHF, HTN, DM, Vascular=MI/PAD/Aortic Plaque, Age if 65-74, or Female] Above score calculated as 2 points each if present [Age > 75, or Stroke/TIA/TE]    Labs from 08/29/15 reviewed  TSH 4.0 Cr .9 BNP 400  PLT 215   Echo showed EF 35-40%    Monitor showed persistant afib. Came in to talk about [facility]  However her son Samantha [name] age 38 was diagnosed with brain cancer He passed on 10-01-22 2015-12-04  She is coping ok and staying with her daughter in law  She is ok to proceed with [facility] in next couple of weeks    Risks including stroke discussed Benefit in regard to rate control stroke prevention and CHF also discussed   Past Medical History  Diagnosis Date  . Asthma   . High cholesterol   . GERD (gastroesophageal reflux disease)   . Hypothyroidism   . Arthritis   . Ventricular ectopy   . LBBB (left bundle branch block)     Past Surgical History  Procedure Laterality Date  . Knee arthroscopy       Current Outpatient Prescriptions  Medication Sig Dispense Refill  . apixaban (ELIQUIS) 5 MG TABS tablet  Take 1 tablet (5 mg total) by mouth 2 (two) times daily. 30 tablet 0  . Ascorbic Acid (VITAMIN C PO) Take 1 tablet by mouth daily.    Marland Kitchen azelastine (ASTELIN) 0.1 % nasal spray Place 1-2 sprays into both nostrils 2 (two) times daily.     . Calcium-Magnesium-Zinc (CALCIUM-MAGNESUIUM-ZINC PO) Take 1 tablet by mouth 3 (three) times daily.     . Cholecalciferol (VITAMIN D PO) Take 1 tablet by mouth daily.     Marland Kitchen denosumab (PROLIA) 60 MG/ML SOLN injection Inject 60 mg into the skin every 6 (six) months. Administer in upper arm, thigh, or abdomen    . furosemide (LASIX) 40 MG tablet Take 1 tablet (40 mg total) by mouth daily. 90 tablet 3  . levothyroxine (SYNTHROID, LEVOTHROID) 88 MCG tablet Take 88 mcg by mouth daily before breakfast.    . loratadine (CLARITIN) 10 MG tablet Take 10 mg by mouth daily.    Marland Kitchen lovastatin (MEVACOR) 20 MG tablet Take 20 mg by mouth daily at 6 PM.     . metoprolol succinate (TOPROL-XL) 50 MG 24 hr tablet Take 1 tablet (50 mg total) by mouth daily. 90 tablet 3  . mometasone-formoterol (DULERA) 100-5 MCG/ACT AERO Inhale 2 puffs into the lungs 2 (two) times daily.    . montelukast (  SINGULAIR) 10 MG tablet Take 10 mg by mouth at bedtime.     . Multiple Vitamins-Minerals (VISION-VITE PRESERVE PO) Take 1 tablet by mouth 2 (two) times daily.    . Omega-3 Fatty Acids (FISH OIL PO) Take 1 capsule by mouth daily.     Marland Kitchen omeprazole (PRILOSEC) 40 MG capsule Take 40 mg by mouth 2 (two) times daily.     . potassium chloride (K-DUR) 10 MEQ tablet Take 1 tablet (10 mEq total) by mouth daily. 90 tablet 3  . PROAIR HFA 108 (90 Base) MCG/ACT inhaler Inhale 1-2 puffs into the lungs daily as needed. For wheezing & SOB    . sodium chloride (OCEAN) 0.65 % SOLN nasal spray Place 1 spray into both nostrils 2 (two) times daily.     No current facility-administered medications for this visit.    Allergies:   Erythromycin; Benzalkonium chloride; Neosporin; and Penicillins    Social History:  The  patient  reports that she has been passively smoking.  She does not have any smokeless tobacco history on file. She reports that she does not drink alcohol or use illicit drugs.   Family History:  The patient's Family history is unknown by patient.    ROS: All other systems are reviewed and negative. Unless otherwise mentioned in H&P    PHYSICAL EXAM: VS:  BP 118/74 mmHg  Pulse 82  Ht 5\' 2"  (1.575 m)  Wt 65.953 kg (145 lb 6.4 oz)  BMI 26.59 kg/m2  SpO2 97% , BMI Body mass index is 26.59 kg/(m^2). GEN: Well nourished, well developed, in no acute distress HEENT: normal Neck: no JVD, carotid bruits, or masses Cardiac: RRR; no murmurs, rubs, or gallops,no edema  Respiratory:  clear to auscultation bilaterally, normal work of breathing GI: soft, nontender, nondistended, + BS MS: no deformity or atrophy Skin: warm and dry, no rash Neuro:  Strength and sensation are intact Psych: euthymic mood, full affect, slightly anxious   Recent Labs: 01/06/2015: Hemoglobin 12.4; Platelets 224 01/07/2015: ALT 15 01/16/2015: Magnesium 2.2; TSH 2.604 10/24/2015: BUN 14; Creat 1.07*; Potassium 3.9; Sodium 138    Lipid Panel    Component Value Date/Time   CHOL 173 05/23/2014 0437   TRIG 98 05/23/2014 0437   HDL 49 05/23/2014 0437   CHOLHDL 3.5 05/23/2014 0437   VLDL 20 05/23/2014 0437   LDLCALC 104* 05/23/2014 0437      Wt Readings from Last 3 Encounters:  11/13/15 65.953 kg (145 lb 6.4 oz)  10/24/15 67.042 kg (147 lb 12.8 oz)  08/31/15 69.219 kg (152 lb 9.6 oz)    ECG:   2016  SR ICLBBB  PVC  08/31/15  afib rate 90 LAD ICLBBB  afib new since previous ECG   ASSESSMENT AND PLAN:  1. Hypertension: Well controlled.  Continue current medications and low sodium Dash type diet.    2. Afib:  Better rate control with Toprol   Echo TSH ok on labs  Plan [facility] opn 6/27  . She has not missed any doses of eliquis She will need labs day before procedure Have called endo and scheduled for 6/27 2:00  Orders  written   3. CHF: better will benefit from restoration of AV synchrony  EF 35-40% .  Likely need stress myovue after [place]   4. Chest Pain:  Normal myovue 06/2014  EF 62%  May need re evaluation given decreased EF    Current medicines are reviewed at length with the patient today.  Signed, Jenkins Rouge, MD  11/13/2015 11:30 AM    [facility] [address], [place], [place] [address] Phone: [phone]; Fax: [phone]

## 2015-11-30 ENCOUNTER — Telehealth: Payer: Self-pay

## 2015-11-30 DIAGNOSIS — I480 Paroxysmal atrial fibrillation: Secondary | ICD-10-CM

## 2015-11-30 NOTE — Telephone Encounter (Signed)
Per Dr. Johnsie Cancel, patient needs new script called in amiodarone 200 bid and f/u office visit in 3-4 weeks post [facility]. Left message for patient to call back.  Patient called back and made an appointment with Dr. Johnsie Cancel at the end of July.

## 2015-12-05 ENCOUNTER — Encounter: Payer: Self-pay | Admitting: Cardiovascular Disease

## 2015-12-28 ENCOUNTER — Ambulatory Visit: Payer: Medicare Other | Admitting: Cardiovascular Disease

## 2016-01-07 ENCOUNTER — Ambulatory Visit (INDEPENDENT_AMBULATORY_CARE_PROVIDER_SITE_OTHER): Payer: Medicare Other | Admitting: Cardiovascular Disease

## 2016-01-07 ENCOUNTER — Encounter: Payer: Self-pay | Admitting: Cardiovascular Disease

## 2016-01-07 VITALS — BP 137/72 | HR 64 | Ht 62.0 in | Wt 143.1 lb

## 2016-01-07 DIAGNOSIS — Z79899 Other long term (current) drug therapy: Secondary | ICD-10-CM | POA: Diagnosis not present

## 2016-01-07 DIAGNOSIS — I480 Paroxysmal atrial fibrillation: Secondary | ICD-10-CM | POA: Diagnosis not present

## 2016-01-07 DIAGNOSIS — I493 Ventricular premature depolarization: Secondary | ICD-10-CM

## 2016-01-07 MED ORDER — APIXABAN 5 MG PO TABS: 5.0000 mg | ORAL_TABLET | Freq: Two times a day (BID) | ORAL | 3 refills | Status: DC

## 2016-01-07 NOTE — Patient Instructions (Signed)
Medication Instructions:  Your physician recommends that you continue on your current medications as directed. Please refer to the Current Medication list given to you today.   Labwork: Your physician recommends that you return for lab work in: 6 months (Tsh, Lft)   Testing/Procedures: Your physician has recommended that you have a pulmonary function test. Pulmonary Function Tests are a group of tests that measure how well air moves in and out of your lungs. (To be scheduled in 6 months)  Follow-Up: Your physician wants you to follow-up in: 6 months with Dr.Nishan You will receive a reminder letter in the mail two months in advance. If you don't receive a letter, please call our office to schedule the follow-up appointment.   Any Other Special Instructions Will Be Listed Below (If Applicable).     If you need a refill on your cardiac medications before your next appointment, please call your pharmacy.

## 2016-01-07 NOTE — Progress Notes (Signed)
Patient ID: [name], female   DOB: 1929/08/10, 82 y.o.   MRN: [id number] Cardiology Office Note   Date:  01/07/2016   ID:  Samantha [name], DOB [birth year], MRN [id number]  PCP:  Mathews Argyle, MD  Cardiologist: Katz/ Jenkins Rouge, MD   Chief Complaint  Patient presents with  . PVC    no sx     History of Present Illness: Samantha [name] is a 82 y.o. female who presents for ongoing assessment of recurrent chest pain, hyperlipidemia, GERD, hypothyroidism.   Seen by Dr Delfina Redwood Increasing symptoms CHF.  Edema PND and orthopnea  ECG noted afib.  Toprol increased , started on Lasix / K  And Eliquis    Long discussion with patient about diagnosis , natural history Rx and risk of stroke  This patients CHA2DS2-VASc Score and unadjusted Ischemic Stroke Rate (% per year) is equal to 4.8 % stroke rate/year from a score of 4  Above score calculated as 1 point each if present [CHF, HTN, DM, Vascular=MI/PAD/Aortic Plaque, Age if 65-74, or Female] Above score calculated as 2 points each if present [Age > 75, or Stroke/TIA/TE]    Labs from 08/29/15 reviewed  TSH 4.0 Cr .9 BNP 400  PLT 215   Echo showed EF 35-40%    Monitor showed persistant afib. Came in to talk about [facility]  However her son [name] age 83 was diagnosed with brain cancer He passed on Sep 22, 2022 Nov 25, 2015    [place] attempted 11/27/15  X 4 would not maintain NSR Loaded with amiodarone. And seems to have converted Since then with meds   Past Medical History:  Diagnosis Date  . Arthritis   . Asthma   . GERD (gastroesophageal reflux disease)   . High cholesterol   . Hypothyroidism   . LBBB (left bundle branch block)   . Ventricular ectopy     Past Surgical History:  Procedure Laterality Date  . CARDIOVERSION N/A 11/27/2015   Procedure: CARDIOVERSION;  Surgeon: Josue Hector, MD;  Location: [facility] ENDOSCOPY;  Service: Cardiovascular;  Laterality: N/A;  . KNEE ARTHROSCOPY       Current Outpatient Prescriptions  Medication Sig  Dispense Refill  . amiodarone (PACERONE) 200 MG tablet Take 1 tablet (200 mg total) by mouth 2 (two) times daily. 90 tablet 3  . apixaban (ELIQUIS) 5 MG TABS tablet Take 1 tablet (5 mg total) by mouth 2 (two) times daily. 180 tablet 3  . Ascorbic Acid (VITAMIN C PO) Take 1 tablet by mouth daily.    Marland Kitchen azelastine (ASTELIN) 0.1 % nasal spray Place 1-2 sprays into both nostrils 2 (two) times daily.     . Calcium-Magnesium-Zinc (CALCIUM-MAGNESUIUM-ZINC PO) Take 1 tablet by mouth 3 (three) times daily.     . Cholecalciferol (VITAMIN D PO) Take 1 tablet by mouth daily.     Marland Kitchen denosumab (PROLIA) 60 MG/ML SOLN injection Inject 60 mg into the skin every 6 (six) months. Administer in upper arm, thigh, or abdomen    . DHA-EPA-VITAMIN E PO Take 1 tablet by mouth 3 (three) times daily.    . furosemide (LASIX) 40 MG tablet Take 1 tablet (40 mg total) by mouth daily. 90 tablet 3  . levothyroxine (SYNTHROID, LEVOTHROID) 88 MCG tablet Take 88 mcg by mouth daily before breakfast.    . loratadine (CLARITIN) 10 MG tablet Take 10 mg by mouth daily.    Marland Kitchen lovastatin (MEVACOR) 20 MG tablet Take 20 mg by mouth daily at 6 PM.     .  metoprolol succinate (TOPROL-XL) 50 MG 24 hr tablet Take 1 tablet (50 mg total) by mouth daily. 90 tablet 3  . mometasone-formoterol (DULERA) 100-5 MCG/ACT AERO Inhale 2 puffs into the lungs 2 (two) times daily.    . montelukast (SINGULAIR) 10 MG tablet Take 10 mg by mouth at bedtime.     . Multiple Vitamins-Minerals (VISION-VITE PRESERVE PO) Take 1 tablet by mouth 2 (two) times daily.    . Omega-3 Fatty Acids (FISH OIL PO) Take 1 capsule by mouth daily.     Marland Kitchen omeprazole (PRILOSEC) 40 MG capsule Take 40 mg by mouth 2 (two) times daily.     . potassium chloride (K-DUR) 10 MEQ tablet Take 1 tablet (10 mEq total) by mouth daily. 90 tablet 3  . PROAIR HFA 108 (90 Base) MCG/ACT inhaler Inhale 1-2 puffs into the lungs daily as needed. For wheezing & SOB    . sodium chloride (OCEAN) 0.65 % SOLN nasal  spray Place 1 spray into both nostrils 2 (two) times daily.     No current facility-administered medications for this visit.     Allergies:   Erythromycin; Benzalkonium chloride; Neosporin [neomycin-bacitracin zn-polymyx]; and Penicillins    Social History:  The patient  reports that she is a non-smoker but has been exposed to tobacco smoke. She has never used smokeless tobacco. She reports that she does not drink alcohol or use drugs.   Family History:  The patient's Family history is unknown by patient.    ROS: All other systems are reviewed and negative. Unless otherwise mentioned in H&P    PHYSICAL EXAM: VS:  BP 137/72 (BP Location: Left Arm, Patient Position: Sitting, Cuff Size: Normal)   Pulse 64   Ht 5\' 2"  (1.575 m)   Wt 143 lb 1.9 oz (64.9 kg)   SpO2 95%   BMI 26.18 kg/m  , BMI Body mass index is 26.18 kg/m. GEN: Well nourished, well developed, in no acute distress  HEENT: normal  Neck: no JVD, carotid bruits, or masses Cardiac: RRR; no murmurs, rubs, or gallops,no edema  Respiratory:  clear to auscultation bilaterally, normal work of breathing GI: soft, nontender, nondistended, + BS MS: no deformity or atrophy  Skin: warm and dry, no rash Neuro:  Strength and sensation are intact Psych: euthymic mood, full affect, slightly anxious   Recent Labs: 01/16/2015: Magnesium 2.2; TSH 2.604 11/26/2015: BUN 22; Creat 1.04; Hemoglobin 12.4; Platelets 268; Potassium 3.8; Sodium 140    Lipid Panel    Component Value Date/Time   CHOL 173 05/23/2014 0437   TRIG 98 05/23/2014 0437   HDL 49 05/23/2014 0437   CHOLHDL 3.5 05/23/2014 0437   VLDL 20 05/23/2014 0437   LDLCALC 104 (H) 05/23/2014 0437      Wt Readings from Last 3 Encounters:  01/07/16 143 lb 1.9 oz (64.9 kg)  11/27/15 145 lb (65.8 kg)  11/13/15 145 lb 6.4 oz (66 kg)    ECG:   2016  SR ICLBBB  PVC  08/31/15  afib rate 90 LAD ICLBBB  afib new since previous ECG  01/07/16  SR rate 62 poor R wave progression PR 218  msec  ASSESSMENT AND PLAN:  1. Hypertension: Well controlled.  Continue current medications and low sodium Dash type diet.    2. Afib: converted with amiodarone check TSH/LFTls PFT;s 11months and hopefully cut amiodarone back to 200 daily 3. CHF: better Should benefit from restoration of AV synchrony  EF 35-40% .   4. Chest Pain:  Normal  myovue 06/2014  EF 62%   Current medicines are reviewed at length with the patient today.      Signed, Jenkins Rouge, MD  01/07/2016 2:57 PM    [facility]. [address], [place], [place] [address] Phone: [phone]; Fax: [phone]

## 2016-01-29 ENCOUNTER — Other Ambulatory Visit: Payer: Self-pay | Admitting: *Deleted

## 2016-03-10 ENCOUNTER — Other Ambulatory Visit: Payer: Self-pay | Admitting: *Deleted

## 2016-03-10 DIAGNOSIS — I480 Paroxysmal atrial fibrillation: Secondary | ICD-10-CM

## 2016-03-10 MED ORDER — AMIODARONE HCL 200 MG PO TABS: 200.0000 mg | ORAL_TABLET | Freq: Two times a day (BID) | ORAL | 2 refills | Status: DC

## 2016-06-24 ENCOUNTER — Telehealth: Payer: Self-pay | Admitting: *Deleted

## 2016-06-24 NOTE — Telephone Encounter (Signed)
1 box of eliquis placed up front. pt aware. no more samples after this one and pt aware of this. she is established.

## 2016-07-09 ENCOUNTER — Ambulatory Visit (INDEPENDENT_AMBULATORY_CARE_PROVIDER_SITE_OTHER): Payer: Medicare Other | Admitting: Internal Medicine

## 2016-07-09 DIAGNOSIS — Z79899 Other long term (current) drug therapy: Secondary | ICD-10-CM | POA: Diagnosis not present

## 2016-07-09 LAB — PULMONARY FUNCTION TEST
DL/VA % PRED: 77 %
DL/VA: 3.51 ml/min/mmHg/L
DLCO COR: 13.13 ml/min/mmHg
DLCO UNC % PRED: 62 %
DLCO UNC: 13.37 ml/min/mmHg
DLCO cor % pred: 60 %
FEF 25-75 PRE: 2.88 L/s
FEF 25-75 Post: 2.7 L/sec
FEF2575-%Change-Post: -6 %
FEF2575-%PRED-PRE: 274 %
FEF2575-%Pred-Post: 256 %
FEV1-%Change-Post: 1 %
FEV1-%PRED-POST: 119 %
FEV1-%PRED-PRE: 117 %
FEV1-POST: 1.9 L
FEV1-PRE: 1.87 L
FEV1FVC-%CHANGE-POST: 1 %
FEV1FVC-%Pred-Pre: 118 %
FEV6-%CHANGE-POST: -1 %
FEV6-%PRED-POST: 104 %
FEV6-%PRED-PRE: 106 %
FEV6-PRE: 2.16 L
FEV6-Post: 2.12 L
FEV6FVC-%CHANGE-POST: -1 %
FEV6FVC-%PRED-POST: 104 %
FEV6FVC-%PRED-PRE: 106 %
FVC-%Change-Post: 0 %
FVC-%Pred-Post: 99 %
FVC-%Pred-Pre: 99 %
FVC-Post: 2.16 L
FVC-Pre: 2.16 L
POST FEV6/FVC RATIO: 98 %
PRE FEV1/FVC RATIO: 87 %
Post FEV1/FVC ratio: 88 %
Pre FEV6/FVC Ratio: 100 %
RV % PRED: 59 %
RV: 1.41 L
TLC % PRED: 76 %
TLC: 3.65 L

## 2016-07-10 ENCOUNTER — Telehealth: Payer: Self-pay

## 2016-07-10 DIAGNOSIS — J849 Interstitial pulmonary disease, unspecified: Secondary | ICD-10-CM

## 2016-07-10 NOTE — Telephone Encounter (Signed)
-----   Message from Josue Hector, MD sent at 07/10/2016  7:42 AM EST ----- Stop amiodarone diffusion capacity down Get hi res non contrast CT for ILD (interstitial lung disease) and have her f/u with pulmonary

## 2016-07-10 NOTE — Telephone Encounter (Signed)
Called patient with PFT results. Per Dr. Johnsie Cancel, Stop amiodarone diffusion capacity down Get hi res non contrast CT for ILD (interstitial lung disease) and have her f/u with pulmonary. Informed patient that scheduling would be calling her to schedule test and referral. Patient verbalized understanding.

## 2016-07-14 ENCOUNTER — Other Ambulatory Visit: Payer: Medicare Other | Admitting: *Deleted

## 2016-07-14 ENCOUNTER — Ambulatory Visit (INDEPENDENT_AMBULATORY_CARE_PROVIDER_SITE_OTHER)
Admission: RE | Admit: 2016-07-14 | Discharge: 2016-07-14 | Disposition: A | Discharge status: Home / Self Care | Payer: Medicare Other | Source: Ambulatory Visit | Attending: Cardiovascular Disease | Admitting: Cardiovascular Disease

## 2016-07-14 DIAGNOSIS — J849 Interstitial pulmonary disease, unspecified: Secondary | ICD-10-CM

## 2016-07-14 DIAGNOSIS — Z79899 Other long term (current) drug therapy: Secondary | ICD-10-CM

## 2016-07-14 NOTE — Progress Notes (Signed)
Er

## 2016-07-14 NOTE — Addendum Note (Signed)
Addended by: Eulis Foster on: 07/14/2016 01:43 PM   Modules accepted: Orders

## 2016-07-15 LAB — HEPATIC FUNCTION PANEL
ALT: 66 IU/L — AB (ref 0–32)
AST: 45 IU/L — AB (ref 0–40)
Albumin: 3.7 g/dL (ref 3.5–4.7)
Alkaline Phosphatase: 73 IU/L (ref 39–117)
BILIRUBIN TOTAL: 0.4 mg/dL (ref 0.0–1.2)
Bilirubin, Direct: 0.1 mg/dL (ref 0.00–0.40)
Total Protein: 6.3 g/dL (ref 6.0–8.5)

## 2016-07-15 LAB — TSH: TSH: 17.39 u[IU]/mL — ABNORMAL HIGH (ref 0.450–4.500)

## 2016-07-16 NOTE — Progress Notes (Signed)
Patient ID: Samantha [name], female   DOB: 08-01-1929, 83 y.o.   MRN: [id number] Cardiology Office Note   Date:  07/16/2016   ID:  Samantha [name], DOB March 09, 1930, MRN [id number]  PCP:  Mathews Argyle, MD  Cardiologist: Katz/ Jenkins Rouge, MD   No chief complaint on file.    History of Present Illness: Samantha [name] is a 83 y.o. female who presents for ongoing assessment of recurrent chest pain, hyperlipidemia, GERD, hypothyroidism.   Seen by Dr Delfina Redwood Increasing symptoms CHF.  Edema PND and orthopnea  ECG noted afib.  Toprol increased , started on Lasix / K  And Eliquis    This patients CHA2DS2-VASc Score and unadjusted Ischemic Stroke Rate (% per year) is equal to 4.8 % stroke rate/year from a score of 4  Above score calculated as 1 point each if present [CHF, HTN, DM, Vascular=MI/PAD/Aortic Plaque, Age if 65-74, or Female] Above score calculated as 2 points each if present [Age > 75, or Stroke/TIA/TE]  Echo showed EF 35-40%    Monitor showed persistant afib.   Her son [name] age 67 was diagnosed with brain cancer He passed on 09/09/22 2015-11-12    [place] attempted 11/27/15  X 4 would not maintain NSR  Started on amiodarone with conversion However PFTls showed low DLCO and LFTls up with elevated TSH so amiodarone stopped 07/10/16  Dr Felipa Eth has increased her synthroid   Lab Results  Component Value Date   TSH 17.390 (H) 07/14/2016   Lab Results  Component Value Date   ALT 66 (H) 07/14/2016   AST 45 (H) 07/14/2016   ALKPHOS 73 07/14/2016   BILITOT 0.4 07/14/2016     Past Medical History:  Diagnosis Date  . Arthritis   . Asthma   . GERD (gastroesophageal reflux disease)   . High cholesterol   . Hypothyroidism   . LBBB (left bundle branch block)   . Ventricular ectopy     Past Surgical History:  Procedure Laterality Date  . CARDIOVERSION N/A 11/27/2015   Procedure: CARDIOVERSION;  Surgeon: Josue Hector, MD;  Location: [facility] ENDOSCOPY;  Service: Cardiovascular;   Laterality: N/A;  . KNEE ARTHROSCOPY       Current Outpatient Prescriptions  Medication Sig Dispense Refill  . apixaban (ELIQUIS) 5 MG TABS tablet Take 1 tablet (5 mg total) by mouth 2 (two) times daily. 180 tablet 3  . Ascorbic Acid (VITAMIN C PO) Take 1 tablet by mouth daily.    Marland Kitchen azelastine (ASTELIN) 0.1 % nasal spray Place 1-2 sprays into both nostrils 2 (two) times daily.     . Calcium-Magnesium-Zinc (CALCIUM-MAGNESUIUM-ZINC PO) Take 1 tablet by mouth 3 (three) times daily.     . Cholecalciferol (VITAMIN D PO) Take 1 tablet by mouth daily.     Marland Kitchen denosumab (PROLIA) 60 MG/ML SOLN injection Inject 60 mg into the skin every 6 (six) months. Administer in upper arm, thigh, or abdomen    . DHA-EPA-VITAMIN E PO Take 1 tablet by mouth 3 (three) times daily.    . furosemide (LASIX) 40 MG tablet Take 1 tablet (40 mg total) by mouth daily. 90 tablet 3  . levothyroxine (SYNTHROID, LEVOTHROID) 88 MCG tablet Take 88 mcg by mouth daily before breakfast.    . loratadine (CLARITIN) 10 MG tablet Take 10 mg by mouth daily.    Marland Kitchen lovastatin (MEVACOR) 20 MG tablet Take 20 mg by mouth daily at 6 PM.     . metoprolol succinate (TOPROL-XL) 50 MG 24  hr tablet Take 1 tablet (50 mg total) by mouth daily. 90 tablet 3  . mometasone-formoterol (DULERA) 100-5 MCG/ACT AERO Inhale 2 puffs into the lungs 2 (two) times daily.    . montelukast (SINGULAIR) 10 MG tablet Take 10 mg by mouth at bedtime.     . Multiple Vitamins-Minerals (VISION-VITE PRESERVE PO) Take 1 tablet by mouth 2 (two) times daily.    . Omega-3 Fatty Acids (FISH OIL PO) Take 1 capsule by mouth daily.     Marland Kitchen omeprazole (PRILOSEC) 40 MG capsule Take 40 mg by mouth 2 (two) times daily.     . potassium chloride (K-DUR) 10 MEQ tablet Take 1 tablet (10 mEq total) by mouth daily. 90 tablet 3  . PROAIR HFA 108 (90 Base) MCG/ACT inhaler Inhale 1-2 puffs into the lungs daily as needed. For wheezing & SOB    . sodium chloride (OCEAN) 0.65 % SOLN nasal spray Place 1  spray into both nostrils 2 (two) times daily.     No current facility-administered medications for this visit.     Allergies:   Erythromycin; Benzalkonium chloride; Neosporin [neomycin-bacitracin zn-polymyx]; and Penicillins    Social History:  The patient  reports that she is a non-smoker but has been exposed to tobacco smoke. She has never used smokeless tobacco. She reports that she does not drink alcohol or use drugs.   Family History:  The patient's Family history is unknown by patient.    ROS: All other systems are reviewed and negative. Unless otherwise mentioned in H&P    PHYSICAL EXAM: VS:  There were no vitals taken for this visit. , BMI There is no height or weight on file to calculate BMI. GEN: Well nourished, well developed, in no acute distress  HEENT: normal  Neck: no JVD, carotid bruits, or masses Cardiac: RRR; no murmurs, rubs, or gallops,no edema  Respiratory:  clear to auscultation bilaterally, normal work of breathing GI: soft, nontender, nondistended, + BS MS: no deformity or atrophy  Skin: warm and dry, no rash Neuro:  Strength and sensation are intact Psych: euthymic mood, full affect, slightly anxious   Recent Labs: 11/26/2015: BUN 22; Creat 1.04; Hemoglobin 12.4; Platelets 268; Potassium 3.8; Sodium 140 07/14/2016: ALT 66; TSH 17.390    Lipid Panel    Component Value Date/Time   CHOL 173 05/23/2014 0437   TRIG 98 05/23/2014 0437   HDL 49 05/23/2014 0437   CHOLHDL 3.5 05/23/2014 0437   VLDL 20 05/23/2014 0437   LDLCALC 104 (H) 05/23/2014 0437      Wt Readings from Last 3 Encounters:  01/07/16 143 lb 1.9 oz (64.9 kg)  11/27/15 145 lb (65.8 kg)  11/13/15 145 lb 6.4 oz (66 kg)    ECG:   2016  SR ICLBBB  PVC  08/31/15  afib rate 90 LAD ICLBBB  afib new since previous ECG  01/07/16  SR rate 62 poor R wave progression PR 218 msec  ASSESSMENT AND PLAN:  1. Hypertension: Well controlled.  Continue current medications and low sodium Dash type diet.     2. Afib: converted with amiodarone but side effects with elevated LFTls , TSH and decreased DLCO 3. CHF: better Should benefit from restoration of AV synchrony  EF 35-40% .   4. Chest Pain:  Normal myovue 06/2014  EF 62%   5. Thyroid:  Synthroid increased f/u labs with Dr Felipa Eth in March 6. LFT;s likely elevated from amiodarone repeat labs in March   Current medicines are reviewed at  length with the patient today.    F/U with me in 3 months    Signed, Jenkins Rouge, MD  07/16/2016 1:47 PM    [facility]  [address] [place], [place] [address] Phone: [phone]; Fax: [phone]

## 2016-07-24 ENCOUNTER — Encounter (INDEPENDENT_AMBULATORY_CARE_PROVIDER_SITE_OTHER): Payer: Self-pay

## 2016-07-24 ENCOUNTER — Encounter: Payer: Self-pay | Admitting: Cardiovascular Disease

## 2016-07-24 ENCOUNTER — Ambulatory Visit (INDEPENDENT_AMBULATORY_CARE_PROVIDER_SITE_OTHER): Payer: Medicare Other | Admitting: Cardiovascular Disease

## 2016-07-24 VITALS — BP 140/60 | HR 62 | Ht 62.0 in | Wt 155.0 lb

## 2016-07-24 DIAGNOSIS — I480 Paroxysmal atrial fibrillation: Secondary | ICD-10-CM

## 2016-07-24 NOTE — Patient Instructions (Addendum)
Medication Instructions:  Your physician recommends that you continue on your current medications as directed. Please refer to the Current Medication list given to you today.  Labwork: NONE  Testing/Procedures: NONE  Follow-Up: Your physician wants you to follow-up in: 3 months with Dr. Nishan.   If you need a refill on your cardiac medications before your next appointment, please call your pharmacy.    

## 2016-08-08 ENCOUNTER — Encounter: Payer: Self-pay | Admitting: Emergency Medicine

## 2016-08-08 ENCOUNTER — Ambulatory Visit (INDEPENDENT_AMBULATORY_CARE_PROVIDER_SITE_OTHER): Payer: Medicare Other | Admitting: Emergency Medicine

## 2016-08-08 VITALS — BP 138/72 | HR 65 | Ht 62.0 in | Wt 154.6 lb

## 2016-08-08 DIAGNOSIS — R9389 Abnormal findings on diagnostic imaging of other specified body structures: Secondary | ICD-10-CM | POA: Insufficient documentation

## 2016-08-08 DIAGNOSIS — J849 Interstitial pulmonary disease, unspecified: Secondary | ICD-10-CM

## 2016-08-08 DIAGNOSIS — R938 Abnormal findings on diagnostic imaging of other specified body structures: Secondary | ICD-10-CM

## 2016-08-08 DIAGNOSIS — J45909 Unspecified asthma, uncomplicated: Secondary | ICD-10-CM

## 2016-08-08 NOTE — Patient Instructions (Addendum)
I agree with staying off amiodarone as recommended by Dr Johnsie Cancel.  We will perform a walking oximetry today in the office.  Continue to use your Dulera 2 puffs twice a day.  Keep albuterol available to use 2 puffs up to every 4 hours if needed for shortness of breath.  We will plan to repeat your CT scan of the chest in one year to insure that your lungs are stable.

## 2016-08-08 NOTE — Assessment & Plan Note (Signed)
Cares a history of asthma although her spirometry done recently was normal. She has benefited clinically from [facility]. We will contineu this .

## 2016-08-08 NOTE — Assessment & Plan Note (Addendum)
Very subtle findings on high-resolution CT scan of the chest. I do not see any evidence of evolving interstitial lung disease. She does have some possible scattered groundglass attenuation (versus air trapping). There is also at least one focus of micronodular change. Unclear that these findings are associated with amiodarone although it could reflect resolving acute pneumonitis. I agree with stopping the medication given the decreased diffusion capacity and the CT scan. I will follow her scan in one year to look for resolution or interval change.   Walking oximetry today given her decreased diffusion capacity.

## 2016-08-08 NOTE — Progress Notes (Signed)
Subjective:    Patient ID: Samantha [name], female    DOB: 1929-08-12, 83 y.o.   MRN: [id number]  HPI 83 year old never smoker with a hx A Fib, GERD, allergic rhinitis, hypothyroidism. She carries a dx of asthma made as an adult. She has been treated with [facility] by Dr Donneta Romberg.   She is referred for evaluation of abnormal PFT and to review CT chest.   She had been on amiodarone for about a year, until 07/10/16 when this was stopped due to decreased DLCO on PFT. I have personally reviewed. This shows normal spirometry, restricted lung volumes, decreased diffusion capacity that does not completely correct when adjusted for Va. A high-resolution CT scan of the chest was done on 07/14/16 that I have reviewed. This shows very subtle scattered groundglass attenuation without any evidence for honeycomb change, traction bronchiectasis or frank scarring. Overall the parenchyma appears to be quite benign. There is a 3 mm right upper lobe nodule, a 65mm cluster of centrilobular nodularity in the medial RML. Both are subtle findings.    She is on [facility], takes reliably. Feels that it does help her some. She describes exertional dyspnea that happens with walking, any kind of chores or housework. She does not wheeze. She does not recall ever having an AE-asthma. She has gained weight -    Review of Systems  Constitutional: Negative for fever and unexpected weight change.  HENT: Positive for postnasal drip. Negative for congestion, dental problem, ear pain, nosebleeds, rhinorrhea, sinus pressure, sneezing, sore throat and trouble swallowing.   Eyes: Negative for redness and itching.  Respiratory: Positive for cough and shortness of breath. Negative for chest tightness and wheezing.   Cardiovascular: Positive for leg swelling. Negative for palpitations.  Gastrointestinal: Negative for nausea and vomiting.  Genitourinary: Negative for dysuria.  Musculoskeletal: Negative for joint swelling.  Skin: Negative for rash.   Neurological: Negative for headaches.  Hematological: Bruises/bleeds easily.  Psychiatric/Behavioral: Negative for dysphoric mood. The patient is not nervous/anxious.     Past Medical History:  Diagnosis Date  . Arthritis   . Asthma   . GERD (gastroesophageal reflux disease)   . High cholesterol   . Hypothyroidism   . LBBB (left bundle branch block)   . Ventricular ectopy      Family History  Problem Relation Age of Onset  . Heart attack Mother   . Throat cancer Father   . Congestive Heart Failure Sister   . Congestive Heart Failure Brother      Social History   Social History  . Marital status: Widowed    Spouse name: N/A  . Number of children: N/A  . Years of education: N/A   Occupational History  . Not on file.   Social History Main Topics  . Smoking status: Passive Smoke Exposure - Never Smoker  . Smokeless tobacco: Never Used  . Alcohol use No  . Drug use: No  . Sexual activity: Not Currently   Other Topics Concern  . Not on file   Social History Narrative  . No narrative on file     Allergies  Allergen Reactions  . Erythromycin Diarrhea  . Benzalkonium Chloride Rash  . Neosporin [Neomycin-Bacitracin Zn-Polymyx] Rash  . Penicillins Rash     Outpatient Medications Prior to Visit  Medication Sig Dispense Refill  . apixaban (ELIQUIS) 5 MG TABS tablet Take 1 tablet (5 mg total) by mouth 2 (two) times daily. 180 tablet 3  . Ascorbic Acid (VITAMIN C PO) Take  1 tablet by mouth daily.    Marland Kitchen azelastine (ASTELIN) 0.1 % nasal spray Place 1-2 sprays into both nostrils 2 (two) times daily.     . Calcium-Magnesium-Zinc (CALCIUM-MAGNESUIUM-ZINC PO) Take 1 tablet by mouth 3 (three) times daily.     . Cholecalciferol (VITAMIN D PO) Take 1 tablet by mouth daily.     Marland Kitchen denosumab (PROLIA) 60 MG/ML SOLN injection Inject 60 mg into the skin every 6 (six) months. Administer in upper arm, thigh, or abdomen    . DHA-EPA-VITAMIN E PO Take 1 tablet by mouth 3 (three) times  daily.    . furosemide (LASIX) 40 MG tablet Take 1 tablet (40 mg total) by mouth daily. 90 tablet 3  . levothyroxine (SYNTHROID) 100 MCG tablet Take 100 mcg by mouth daily before breakfast.    . loratadine (CLARITIN) 10 MG tablet Take 10 mg by mouth daily.    Marland Kitchen lovastatin (MEVACOR) 20 MG tablet Take 20 mg by mouth daily at 6 PM.     . metoprolol succinate (TOPROL-XL) 50 MG 24 hr tablet Take 1 tablet (50 mg total) by mouth daily. 90 tablet 3  . mometasone-formoterol (DULERA) 100-5 MCG/ACT AERO Inhale 2 puffs into the lungs 2 (two) times daily.    . montelukast (SINGULAIR) 10 MG tablet Take 10 mg by mouth at bedtime.     . Omega-3 Fatty Acids (FISH OIL PO) Take 1 capsule by mouth daily.     Marland Kitchen omeprazole (PRILOSEC) 40 MG capsule Take 40 mg by mouth 2 (two) times daily.     . potassium chloride (K-DUR) 10 MEQ tablet Take 1 tablet (10 mEq total) by mouth daily. 90 tablet 3  . PROAIR HFA 108 (90 Base) MCG/ACT inhaler Inhale 1-2 puffs into the lungs daily as needed. For wheezing & SOB    . sodium chloride (OCEAN) 0.65 % SOLN nasal spray Place 1 spray into both nostrils 2 (two) times daily.    . Multiple Vitamins-Minerals (VISION-VITE PRESERVE PO) Take 1 tablet by mouth 2 (two) times daily.     No facility-administered medications prior to visit.         Objective:   Physical Exam Vitals:   08/08/16 1001  BP: 138/72  Pulse: 65  SpO2: 98%  Weight: 154 lb 9.6 oz (70.1 kg)  Height: 5\' 2"  (1.575 m)   Gen: Pleasant, elderly overwt, in no distress,  normal affect  ENT: No lesions,  mouth clear,  oropharynx clear, no postnasal drip  Neck: No JVD, no TMG, no carotid bruits  Lungs: No use of accessory muscles, clear without rales or rhonchi, no crackles  Cardiovascular: irregular,  no peripheral edema  Musculoskeletal: No deformities, no cyanosis or clubbing  Neuro: alert, non focal  Skin: Warm, no lesions or rashes       Assessment & Plan:  Intrinsic asthma Cares a history of  asthma although her spirometry done recently was normal. She has benefited clinically from [facility]. We will contineu this .   Abnormal CT scan, chest Very subtle findings on high-resolution CT scan of the chest. I do not see any evidence of evolving interstitial lung disease. She does have some possible scattered groundglass attenuation (versus air trapping). There is also at least one focus of micronodular change. Unclear that these findings are associated with amiodarone although it could reflect resolving acute pneumonitis. I agree with stopping the medication given the decreased diffusion capacity and the CT scan. I will follow her scan in one year to look  for resolution or interval change.   Walking oximetry today given her decreased diffusion capacity.  Baltazar Apo, MD, PhD 08/08/2016, 10:41 AM [facility] [phone] or if no answer [phone]

## 2016-08-12 NOTE — Addendum Note (Signed)
Addended by: Maryanna Shape A on: 08/12/2016 11:30 AM   Modules accepted: Orders

## 2016-10-08 ENCOUNTER — Encounter: Payer: Self-pay | Admitting: Cardiovascular Disease

## 2016-10-21 ENCOUNTER — Other Ambulatory Visit: Payer: Self-pay | Admitting: Cardiovascular Disease

## 2016-10-21 NOTE — Telephone Encounter (Signed)
Request received for Eliquis 5mg ; pt is 83 yrs old, wt-70.1kg on 08/08/16, Crea-1.04 on 11/26/15, & last seen by Dr. Johnsie Cancel on 07/24/16; pt is due to see Dr. Johnsie Cancel on 10/30/16 & a note has been placed on the appt to obtain a CBC & CMET. Will send in refill.

## 2016-10-28 NOTE — Progress Notes (Signed)
Patient ID: Samantha [name], female   DOB: [birth year], 83 y.o.   MRN: [id number] Cardiology Office Note   Date:  10/30/2016   ID:  Samantha [name], DOB [birth year], MRN [id number]  PCP:  Lajean Manes, MD  Cardiologist: Katz/ Jenkins Rouge, MD   Chief Complaint  Patient presents with  . PAF     History of Present Illness: Samantha [name] is a 83 y.o. female who presents for ongoing assessment of recurrent chest pain, hyperlipidemia, GERD, hypothyroidism.   Seen by Dr Delfina Redwood Increasing symptoms CHF.  Edema PND and orthopnea  ECG noted afib.  Toprol increased , started on Lasix / K  And Eliquis    This patients CHA2DS2-VASc Score and unadjusted Ischemic Stroke Rate (% per year) is equal to 4.8 % stroke rate/year from a score of 4  Above score calculated as 1 point each if present [CHF, HTN, DM, Vascular=MI/PAD/Aortic Plaque, Age if 65-74, or Female] Above score calculated as 2 points each if present [Age > 75, or Stroke/TIA/TE]  Echo 09/03/15 reviewed  showed EF 35-40%    Monitor showed persistant afib.   Her son [name] age 105 was diagnosed with brain cancer He passed on 18-Sep-2022 21-Nov-2015    Bartholomew attempted 11/27/15  X 4 would not maintain NSR  Started on amiodarone with conversion However PFTls showed low DLCO and LFTls up with elevated TSH so amiodarone stopped 07/10/16  Dr Felipa Eth has increased her synthroid She has some somatic complaints with pain To touch in her legs some skin bruising on DOAC  Lab Results  Component Value Date   TSH 17.390 (H) 07/14/2016   Lab Results  Component Value Date   ALT 66 (H) 07/14/2016   AST 45 (H) 07/14/2016   ALKPHOS 73 07/14/2016   BILITOT 0.4 07/14/2016     Past Medical History:  Diagnosis Date  . Arthritis   . Asthma   . GERD (gastroesophageal reflux disease)   . High cholesterol   . Hypothyroidism   . LBBB (left bundle branch block)   . Ventricular ectopy     Past Surgical History:  Procedure Laterality Date  . CARDIOVERSION N/A  11/27/2015   Procedure: CARDIOVERSION;  Surgeon: Josue Hector, MD;  Location: [facility] ENDOSCOPY;  Service: Cardiovascular;  Laterality: N/A;  . KNEE ARTHROSCOPY       Current Outpatient Prescriptions  Medication Sig Dispense Refill  . Ascorbic Acid (VITAMIN C PO) Take 1 tablet by mouth daily.    Marland Kitchen azelastine (ASTELIN) 0.1 % nasal spray Place 1-2 sprays into both nostrils 2 (two) times daily.     . Calcium-Magnesium-Zinc (CALCIUM-MAGNESUIUM-ZINC PO) Take 1 tablet by mouth 3 (three) times daily.     . Cholecalciferol (VITAMIN D PO) Take 1 tablet by mouth daily.     Marland Kitchen denosumab (PROLIA) 60 MG/ML SOLN injection Inject 60 mg into the skin every 6 (six) months. Administer in upper arm, thigh, or abdomen    . ELIQUIS 5 MG TABS tablet TAKE 1 TABLET BY MOUTH TWO  TIMES DAILY 180 tablet 0  . furosemide (LASIX) 40 MG tablet Take 1 tablet (40 mg total) by mouth daily. 90 tablet 3  . levothyroxine (SYNTHROID, LEVOTHROID) 112 MCG tablet Take 112 mcg by mouth daily.    Marland Kitchen loratadine (CLARITIN) 10 MG tablet Take 10 mg by mouth daily.    Marland Kitchen lovastatin (MEVACOR) 20 MG tablet Take 20 mg by mouth daily at 6 PM.     . metoprolol succinate (TOPROL-XL) 50  MG 24 hr tablet Take 1 tablet (50 mg total) by mouth daily. 90 tablet 3  . mometasone-formoterol (DULERA) 100-5 MCG/ACT AERO Inhale 2 puffs into the lungs 2 (two) times daily.    . montelukast (SINGULAIR) 10 MG tablet Take 10 mg by mouth at bedtime.     . Omega-3 Fatty Acids (FISH OIL PO) Take 1 capsule by mouth daily.     Marland Kitchen omeprazole (PRILOSEC) 40 MG capsule Take 40 mg by mouth 2 (two) times daily.     . potassium chloride (K-DUR) 10 MEQ tablet Take 1 tablet (10 mEq total) by mouth daily. 90 tablet 3  . PROAIR HFA 108 (90 Base) MCG/ACT inhaler Inhale 1-2 puffs into the lungs daily as needed. For wheezing & SOB    . sodium chloride (OCEAN) 0.65 % SOLN nasal spray Place 1 spray into both nostrils 2 (two) times daily.     No current facility-administered medications  for this visit.     Allergies:   Erythromycin; Benzalkonium chloride; Neosporin [neomycin-bacitracin zn-polymyx]; and Penicillins    Social History:  The patient  reports that she is a non-smoker but has been exposed to tobacco smoke. She has never used smokeless tobacco. She reports that she does not drink alcohol or use drugs.   Family History:  The patient's family history includes Congestive Heart Failure in her brother and sister; Heart attack in her mother; Throat cancer in her father.    ROS: All other systems are reviewed and negative. Unless otherwise mentioned in H&P    PHYSICAL EXAM: BP 140/60   Pulse 61   Ht 5\' 2"  (1.575 m)   Wt 157 lb 6.4 oz (71.4 kg)   SpO2 97%   BMI 28.79 kg/m  Affect anxious  Healthy:  appears stated age 39: normal Neck supple with no adenopathy JVP normal no bruits no thyromegaly Lungs clear with no wheezing and good diaphragmatic motion Heart:  S1/S2 no murmur, no rub, gallop or click PMI normal Abdomen: benighn, BS positve, no tenderness, no AAA no bruit.  No HSM or HJR Distal pulses intact with no bruits No edema Neuro non-focal Skin warm and dry No muscular weakness    Recent Labs: 11/26/2015: BUN 22; Creat 1.04; Hemoglobin 12.4; Platelets 268; Potassium 3.8; Sodium 140 07/14/2016: ALT 66; TSH 17.390    Lipid Panel    Component Value Date/Time   CHOL 173 05/23/2014 0437   TRIG 98 05/23/2014 0437   HDL 49 05/23/2014 0437   CHOLHDL 3.5 05/23/2014 0437   VLDL 20 05/23/2014 0437   LDLCALC 104 (H) 05/23/2014 0437      Wt Readings from Last 3 Encounters:  10/30/16 157 lb 6.4 oz (71.4 kg)  08/08/16 154 lb 9.6 oz (70.1 kg)  07/24/16 155 lb (70.3 kg)    ECG:   2016  SR ICLBBB  PVC  08/31/15  afib rate 90 LAD ICLBBB  afib new since previous ECG  01/07/16  SR rate 62 poor R wave progression PR 218 msec  ASSESSMENT AND PLAN:  1. Hypertension: Well controlled.  Continue current medications and low sodium Dash type diet.    2.  Afib: converted with amiodarone but side effects with elevated LFTls , TSH and decreased DLCO 3. CHF: better Should benefit from restoration of AV synchrony  EF 35-40% .   Will update echo to reassess EF  4. Chest Pain:  Normal myovue 06/2014  EF 62%   5. Thyroid:  Synthroid increased f/u labs with Dr Felipa Eth  in March 6. LFT;s likely elevated from amiodarone repeat labs in March   Current medicines are reviewed at length with the patient today.    F/U with me in a year    Signed, Jenkins Rouge, MD  10/30/2016 4:17 PM    [facility] [address], [place], [place] [address] Phone: [phone]; Fax: [phone]

## 2016-10-30 ENCOUNTER — Encounter (INDEPENDENT_AMBULATORY_CARE_PROVIDER_SITE_OTHER): Payer: Self-pay

## 2016-10-30 ENCOUNTER — Ambulatory Visit (INDEPENDENT_AMBULATORY_CARE_PROVIDER_SITE_OTHER): Payer: Medicare Other | Admitting: Cardiovascular Disease

## 2016-10-30 ENCOUNTER — Encounter: Payer: Self-pay | Admitting: Cardiovascular Disease

## 2016-10-30 VITALS — BP 140/60 | HR 61 | Ht 62.0 in | Wt 157.4 lb

## 2016-10-30 DIAGNOSIS — I480 Paroxysmal atrial fibrillation: Secondary | ICD-10-CM

## 2016-10-30 DIAGNOSIS — I428 Other cardiomyopathies: Secondary | ICD-10-CM | POA: Diagnosis not present

## 2016-10-30 NOTE — Patient Instructions (Addendum)

## 2016-11-12 ENCOUNTER — Other Ambulatory Visit: Payer: Self-pay

## 2016-11-12 ENCOUNTER — Ambulatory Visit (HOSPITAL_COMMUNITY): Payer: Medicare Other | Attending: Cardiology

## 2016-11-12 DIAGNOSIS — I428 Other cardiomyopathies: Secondary | ICD-10-CM | POA: Diagnosis not present

## 2016-11-12 DIAGNOSIS — I340 Nonrheumatic mitral (valve) insufficiency: Secondary | ICD-10-CM | POA: Insufficient documentation

## 2016-11-12 DIAGNOSIS — I480 Paroxysmal atrial fibrillation: Secondary | ICD-10-CM | POA: Diagnosis present

## 2016-11-13 ENCOUNTER — Telehealth: Payer: Self-pay | Admitting: Cardiovascular Disease

## 2016-11-13 NOTE — Telephone Encounter (Signed)
Called patient back with her echo results.

## 2016-11-13 NOTE — Telephone Encounter (Signed)
Patient returning your call, thanks. °

## 2016-12-13 ENCOUNTER — Ambulatory Visit (HOSPITAL_COMMUNITY)
Admission: EM | Admit: 2016-12-13 | Discharge: 2016-12-13 | Disposition: A | Discharge status: Home / Self Care | Payer: Medicare Other | Attending: Internal Medicine | Admitting: Internal Medicine

## 2016-12-13 ENCOUNTER — Ambulatory Visit (INDEPENDENT_AMBULATORY_CARE_PROVIDER_SITE_OTHER): Payer: Medicare Other

## 2016-12-13 ENCOUNTER — Encounter (HOSPITAL_COMMUNITY): Payer: Self-pay | Admitting: *Deleted

## 2016-12-13 DIAGNOSIS — S60021A Contusion of right index finger without damage to nail, initial encounter: Secondary | ICD-10-CM

## 2016-12-13 NOTE — ED Triage Notes (Signed)
Pt noticed painful distal right index finger approx 1 mo ago; 2 wks ago pain went away, but noticed a "pin prick" to distal right index finger, followed by discoloration of the skin.  2 days ago had some tingling in same finger, which has resolved, but yesterday finger was swollen, and today discoloration is extending into entire finger.  Distal finger warm with cap refill <3 sec.

## 2016-12-13 NOTE — ED Provider Notes (Signed)
CSN: [id number]     Arrival date & time 12/13/16  1636 History   First MD Initiated Contact with Patient 12/13/16 1706     Chief Complaint  Patient presents with  . Hand Pain   (Consider location/radiation/quality/duration/timing/severity/associated sxs/prior Treatment) Patient has hx of arthritis to her fingers, here for right 2nd digit finger pain for "1 month or more". She "sorta got used to the pain" and thought that her pain was due to her arthritis, until 1 week ago when she noticed a "pin hole" on her skin on the distal finger and this "pin hole" got bigger eventually. Pain also noticed her finger pain got worse 1 week ago when she noticed this "pin hole". Patient also concern for finger discoloration. She have been putting OTC antibiotic ointment over this "pin hole". She reports to have full ROM.       Past Medical History:  Diagnosis Date  . Arthritis   . Asthma   . GERD (gastroesophageal reflux disease)   . High cholesterol   . Hypothyroidism   . LBBB (left bundle branch block)   . Ventricular ectopy    Past Surgical History:  Procedure Laterality Date  . CARDIOVERSION N/A 11/27/2015   Procedure: CARDIOVERSION;  Surgeon: Josue Hector, MD;  Location: [facility] ENDOSCOPY;  Service: Cardiovascular;  Laterality: N/A;  . KNEE ARTHROSCOPY     Family History  Problem Relation Age of Onset  . Heart attack Mother   . Throat cancer Father   . Congestive Heart Failure Sister   . Congestive Heart Failure Brother    Social History  Substance Use Topics  . Smoking status: Passive Smoke Exposure - Never Smoker  . Smokeless tobacco: Never Used  . Alcohol use No   OB History    No data available     Review of Systems  Constitutional:       See HPI    Allergies  Erythromycin; Benzalkonium chloride; Neosporin [neomycin-bacitracin zn-polymyx]; and Penicillins  Home Medications   Prior to Admission medications   Medication Sig Start Date End Date Taking? Authorizing Provider   Ascorbic Acid (VITAMIN C PO) Take 1 tablet by mouth daily.   Yes [provider]  azelastine (ASTELIN) 0.1 % nasal spray Place 1-2 sprays into both nostrils 2 (two) times daily.  04/18/14  Yes [provider]  Calcium-Magnesium-Zinc (CALCIUM-MAGNESUIUM-ZINC PO) Take 1 tablet by mouth 3 (three) times daily.    Yes [provider]  Cholecalciferol (VITAMIN D PO) Take 1 tablet by mouth daily.    Yes [provider]  denosumab (PROLIA) 60 MG/ML SOLN injection Inject 60 mg into the skin every 6 (six) months. Administer in upper arm, thigh, or abdomen   Yes [provider]  ELIQUIS 5 MG TABS tablet TAKE 1 TABLET BY MOUTH TWO  TIMES DAILY 10/21/16  Yes Josue Hector, MD  furosemide (LASIX) 40 MG tablet Take 1 tablet (40 mg total) by mouth daily. 10/24/15  Yes Josue Hector, MD  Hypromellose (ARTIFICIAL TEARS OP) Apply to eye.   Yes [provider]  levothyroxine (SYNTHROID, LEVOTHROID) 112 MCG tablet Take 112 mcg by mouth daily. 10/23/16  Yes [provider]  loratadine (CLARITIN) 10 MG tablet Take 10 mg by mouth daily.   Yes [provider]  lovastatin (MEVACOR) 20 MG tablet Take 20 mg by mouth daily at 6 PM.  04/12/14  Yes [provider]  metoprolol succinate (TOPROL-XL) 50 MG 24 hr tablet Take 1 tablet (50  mg total) by mouth daily. 08/31/15  Yes Josue Hector, MD  mometasone-formoterol (DULERA) 100-5 MCG/ACT AERO Inhale 2 puffs into the lungs 2 (two) times daily.   Yes [provider]  montelukast (SINGULAIR) 10 MG tablet Take 10 mg by mouth at bedtime.  04/12/14  Yes [provider]  Multiple Vitamins-Minerals (PRESERVISION AREDS PO) Take by mouth.   Yes [provider]  Omega-3 Fatty Acids (FISH OIL PO) Take 1 capsule by mouth daily.    Yes [provider]  omeprazole (PRILOSEC) 40 MG capsule Take 40 mg by mouth 2 (two) times daily.  04/18/14  Yes [provider]    Polyethylene Glycol 3350 (MIRALAX PO) Take by mouth.   Yes [provider]  potassium chloride (K-DUR) 10 MEQ tablet Take 1 tablet (10 mEq total) by mouth daily. 10/24/15  Yes Josue Hector, MD  PROAIR HFA 108 [phone] Base) MCG/ACT inhaler Inhale 1-2 puffs into the lungs daily as needed. For wheezing & SOB 08/21/15  Yes [provider]  sodium chloride (OCEAN) 0.65 % SOLN nasal spray Place 1 spray into both nostrils 2 (two) times daily.   Yes [provider]   Meds Ordered and Administered this Visit  Medications - No data to display  BP 135/63   Pulse 69   Temp 97.8 F (36.6 C) (Oral)   Resp 16   SpO2 97%  No data found.   Physical Exam  Constitutional: She is oriented to person, place, and time. She appears well-developed and well-nourished.  Cardiovascular: Normal rate, regular rhythm and normal heart sounds.   Pulmonary/Chest: Effort normal and breath sounds normal. She has no wheezes.  Musculoskeletal:  Right 2nd digit has full ROM. No deformity. No swelling  Neurological: She is alert and oriented to person, place, and time.  Skin:  Has intact pulse, motor and sensation. Good cap refill. Noted to have some bruising on the distal right 2nd digit finger.   See picture below: There is a pinpoint injury to the skin on distal finger with dried blood noted. Patient refers this as the "pin hole". This particular spot is tender to palpate. Has thickened layers of skin underneath.   Nursing note and vitals reviewed.       Urgent Care Course     Procedures (including critical care time)  Labs Review Labs Reviewed - No data to display  Imaging Review Dg Finger Index Right  Result Date: 12/13/2016 CLINICAL DATA:  Right second finger pain for 1 month with bruising. No reported injury. EXAM: RIGHT INDEX FINGER 2+V COMPARISON:  None. FINDINGS: No fracture or dislocation. No suspicious focal osseous lesion. Moderate osteoarthritis in the distal  interphalangeal joints of the right second and third fingers. Minimal osteoarthritis at the second metacarpophalangeal joint. No appreciable joint erosions. No radiopaque foreign body. IMPRESSION: Mild to moderate polyarticular osteoarthritis as detailed. No fracture or malalignment. Electronically Signed   By: Ilona Sorrel M.D.   On: 12/13/2016 17:36     MDM   1. Contusion of right index finger without damage to nail, initial encounter    Xray shows arthritis of the finger, patient informed of this.  Suspect that patient may have injured the distal 2nd right digit finger 1 week ago without knowing.   Suspect that there may be a possible splinter underneath this speck of dried blood given that this area is tender to palpate with thickened skin underneath, patient informed that we may attempt to remove any possible splinter  by anesthestizing this area with lidocaine and remove a possible splinter using Scalpel and needle, which she agreed to do so and procedure was performed but no splinter was found.   Patient advised to keep the eye on the area. Continue to monitor the symptoms. Please take ibuprofen or tylenol for pain. If bruising gets worse or if pain gets worse, then please return.     Barry Dienes, NP 12/13/16 2149

## 2016-12-23 ENCOUNTER — Other Ambulatory Visit: Payer: Self-pay | Admitting: Cardiovascular Disease

## 2016-12-23 NOTE — Telephone Encounter (Signed)
Pt last saw Dr Johnsie Cancel on 10/30/16, last labs 11/26/15 Creat 1.04, overdue for follow-up labwork.  Called Dr Carlyle Lipa office labwork done in January of 2018, will fax results.  Pt's age 83, weight 71.4, will awit lab results to refill Eliquis rx.

## 2016-12-23 NOTE — Telephone Encounter (Signed)
Labwork received from Dr Carlyle Lipa office Creat 1.16 on 06/27/16, age 83, weight 71.4kg, based on specified criteria pt is on appropriate dosage of Eliquis 5mg  BID.  Will refill rx.

## 2017-04-02 ENCOUNTER — Other Ambulatory Visit: Payer: Self-pay | Admitting: Cardiovascular Disease

## 2017-04-02 NOTE — Telephone Encounter (Signed)
Pt last saw Dr Johnsie Cancel 10/30/16, last labs 06/27/16 Creat 1.16, age 83, weight 71.4kg, based on specified criteria pt is on appropriate dosage of Eliquis 5mg  BID.  Will refill rx.

## 2017-04-20 ENCOUNTER — Ambulatory Visit (INDEPENDENT_AMBULATORY_CARE_PROVIDER_SITE_OTHER): Payer: Medicare Other | Admitting: Orthopaedic Surgery

## 2017-04-20 ENCOUNTER — Ambulatory Visit (INDEPENDENT_AMBULATORY_CARE_PROVIDER_SITE_OTHER): Payer: Self-pay

## 2017-04-20 ENCOUNTER — Encounter (INDEPENDENT_AMBULATORY_CARE_PROVIDER_SITE_OTHER): Payer: Self-pay | Admitting: Orthopaedic Surgery

## 2017-04-20 VITALS — BP 122/67 | HR 70 | Resp 16 | Ht 62.0 in | Wt 151.0 lb

## 2017-04-20 DIAGNOSIS — G8929 Other chronic pain: Secondary | ICD-10-CM

## 2017-04-20 DIAGNOSIS — M25562 Pain in left knee: Secondary | ICD-10-CM

## 2017-04-20 NOTE — Progress Notes (Signed)
Office Visit Note   Patient: Samantha [name]           Date of Birth: 1930/03/29           MRN: [id number] Visit Date: 04/20/2017              Requested by: Lajean Manes, MD [address] [place], [place] [address] PCP: Lajean Manes, MD   Assessment & Plan: Visit Diagnoses:  1. 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 Long discussion regarding diagnosis and treatment options. For the moment she's comfortable and does not need a knee injection or aspiration. We'll be happy to see her back at any time. Answered all questions  Follow-Up Instructions: Return if symptoms worsen or fail to improve.   Orders:  Orders Placed This Encounter  Procedures  . XR KNEE 3 VIEW LEFT   No orders of the defined types were placed in this encounter.     Procedures: No procedures performed   Clinical Data: No additional findings.   Subjective: Chief Complaint  Patient presents with  . Left Knee - Pain  . Knee Pain    04/03/17 neck PT for spasms x 2 weekly, 04/10/17 weight machine standing - arm exercises, knee pain since PT, could not bend or bear weight, PT 04/14/17 stretching at PT, worse, 04/16/17 Dr. 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 During the course of her therapy she was using some machine set caused her "left knee to hurt". She saw Dr. Felipa Eth underwent turn referred her to the office. She's presently "99% better but still having a little difficulty flexing and extending her knee  HPI  Review of Systems  Constitutional: Positive for activity change and fatigue.  HENT: Negative for congestion.   Respiratory: Negative for shortness of breath.   Cardiovascular: Positive for leg swelling.    Gastrointestinal: Negative for constipation.  Endocrine: Negative for cold intolerance.  Genitourinary: Negative for difficulty urinating.  Musculoskeletal: Positive for back pain, gait problem, joint swelling and neck pain. Negative for neck stiffness.  Skin: Negative for rash.  Allergic/Immunologic: Negative for food allergies.  Neurological: Negative for light-headedness.  Hematological: Bruises/bleeds easily.  Psychiatric/Behavioral: Positive for sleep disturbance.     Objective: Vital Signs: BP 122/67 (BP Location: Left Arm, Patient Position: Sitting, Cuff Size: Normal)   Pulse 70   Resp 16   Ht 5\' 2"  (1.575 m)   Wt 151 lb (68.5 kg)   BMI 27.62 kg/m   Physical Exam  Ortho Exam awake alert and oriented 3. Comfortable sitting. Effusion left knee but very minimal increased heat. Lacks just a few degrees to full extension. Walks without a limp. Flexed about 100. No instability. No calf pain. Neurovascular exam intact. No significant tenderness along the medial lateral joint or patella  Specialty Comments:  No specialty comments available.  Imaging: No results found.   PMFS History: Patient Active Problem List   Diagnosis Date Noted  . Abnormal CT scan, chest 08/08/2016  . Near syncope 01/06/2015  . Left leg weakness 01/06/2015  . High cholesterol   . Hypothyroidism   . Ventricular ectopy   . Chest pain 05/22/2014  . Left bundle branch block 05/22/2014  . PVC's (premature ventricular contractions) 05/22/2014  .  Intrinsic asthma 02/14/2010  . HOARSENESS 02/14/2010  . CHEST PAIN, ATYPICAL 02/14/2010  . ALLERGIC RHINITIS 02/13/2010  . G E R D 02/13/2010   Past Medical History:  Diagnosis Date  . Arthritis   . Asthma   . GERD (gastroesophageal reflux disease)   . High cholesterol   . Hypothyroidism   . LBBB (left bundle branch block)   . Ventricular ectopy     Family History  Problem Relation Age of Onset  . Heart attack Mother   . Throat cancer Father    . Congestive Heart Failure Sister   . Congestive Heart Failure Brother     Past Surgical History:  Procedure Laterality Date  . CARDIOVERSION N/A 11/27/2015   Performed by Josue Hector, MD at [facility] ENDOSCOPY  . KNEE ARTHROSCOPY     Social History   Occupational History  . Not on file  Tobacco Use  . Smoking status: Passive Smoke Exposure - Never Smoker  . Smokeless tobacco: Never Used  Substance and Sexual Activity  . Alcohol use: No    Alcohol/week: 0.0 oz  . Drug use: No  . Sexual activity: Not on file

## 2017-05-08 ENCOUNTER — Ambulatory Visit
Admission: RE | Admit: 2017-05-08 | Discharge: 2017-05-08 | Disposition: A | Discharge status: Home / Self Care | Payer: Medicare Other | Source: Ambulatory Visit | Attending: Geriatric Medicine | Admitting: Geriatric Medicine

## 2017-05-08 ENCOUNTER — Other Ambulatory Visit: Payer: Self-pay | Admitting: Geriatric Medicine

## 2017-05-08 DIAGNOSIS — J40 Bronchitis, not specified as acute or chronic: Secondary | ICD-10-CM

## 2017-06-08 ENCOUNTER — Other Ambulatory Visit: Payer: Self-pay | Admitting: Geriatric Medicine

## 2017-06-08 ENCOUNTER — Ambulatory Visit
Admission: RE | Admit: 2017-06-08 | Discharge: 2017-06-08 | Disposition: A | Discharge status: Home / Self Care | Payer: Medicare HMO | Source: Ambulatory Visit | Attending: Geriatric Medicine | Admitting: Geriatric Medicine

## 2017-06-08 DIAGNOSIS — R911 Solitary pulmonary nodule: Secondary | ICD-10-CM

## 2017-06-23 ENCOUNTER — Other Ambulatory Visit: Payer: Self-pay | Admitting: *Deleted

## 2017-06-23 MED ORDER — APIXABAN 5 MG PO TABS: 5.0000 mg | ORAL_TABLET | Freq: Two times a day (BID) | ORAL | 1 refills | Status: DC

## 2017-07-28 DIAGNOSIS — H6123 Impacted cerumen, bilateral: Secondary | ICD-10-CM | POA: Diagnosis not present

## 2017-07-29 DIAGNOSIS — L84 Corns and callosities: Secondary | ICD-10-CM | POA: Diagnosis not present

## 2017-07-29 DIAGNOSIS — I480 Paroxysmal atrial fibrillation: Secondary | ICD-10-CM | POA: Diagnosis not present

## 2017-07-29 DIAGNOSIS — E7800 Pure hypercholesterolemia, unspecified: Secondary | ICD-10-CM | POA: Diagnosis not present

## 2017-07-29 DIAGNOSIS — M818 Other osteoporosis without current pathological fracture: Secondary | ICD-10-CM | POA: Diagnosis not present

## 2017-07-29 DIAGNOSIS — I509 Heart failure, unspecified: Secondary | ICD-10-CM | POA: Diagnosis not present

## 2017-08-07 ENCOUNTER — Other Ambulatory Visit: Payer: Self-pay | Admitting: *Deleted

## 2017-08-09 ENCOUNTER — Telehealth: Payer: Self-pay | Admitting: Cardiology

## 2017-08-09 ENCOUNTER — Encounter (HOSPITAL_COMMUNITY): Payer: Self-pay | Admitting: *Deleted

## 2017-08-09 ENCOUNTER — Ambulatory Visit (HOSPITAL_COMMUNITY)
Admission: EM | Admit: 2017-08-09 | Discharge: 2017-08-09 | Disposition: A | Discharge status: Home / Self Care | Payer: Medicare HMO | Attending: Internal Medicine | Admitting: Internal Medicine

## 2017-08-09 ENCOUNTER — Other Ambulatory Visit: Payer: Self-pay

## 2017-08-09 DIAGNOSIS — M5412 Radiculopathy, cervical region: Secondary | ICD-10-CM

## 2017-08-09 MED ORDER — PREDNISONE 20 MG PO TABS: 40.0000 mg | ORAL_TABLET | Freq: Every day | ORAL | 0 refills | Status: AC

## 2017-08-09 NOTE — Discharge Instructions (Signed)
Complete course of prednisone. Light and regular activity. Stretching to arms and neck. Warm application to neck muscles may help. Please follow up with your primary care doctor for recheck of symptoms in the next week. If you develop worsening of chest pain, shortness of breath, worsening numbness, nausea, pain, or otherwise worsening please go to the Er.

## 2017-08-09 NOTE — ED Provider Notes (Signed)
[facility]    CSN: [id number] Arrival date & time: 08/09/17  1300     History   Chief Complaint No chief complaint on file.   HPI Samantha [name] is a 84 y.o. female.   Samantha [name] presents with family with weeks of right arm and hand numbness and tingling, occasional left hand tingling, neck muscle tightness, which has been ongoing for weeks now. She states with sleeping or prolonged sitting her symptoms worsen. No known injury to arms or neck. Denies any previous similar. Denies chest pain or palpitations. Gross sensation intact but feels that her entire right hand feels numb. Denies gi/gu complaints. States she is quite sedentary during the day. She is right handed. Has not taken any medications for symptoms. She is not a diabetic. Hx of arthritis, asthma, gerd, lbbb, afib. She is on eliquis and metoprolol.     ROS per HPI.       Past Medical History:  Diagnosis Date  . Arthritis   . Asthma   . GERD (gastroesophageal reflux disease)   . High cholesterol   . Hypothyroidism   . LBBB (left bundle branch block)   . Ventricular ectopy     Patient Active Problem List   Diagnosis Date Noted  . Abnormal CT scan, chest 08/08/2016  . Near syncope 01/06/2015  . Left leg weakness 01/06/2015  . High cholesterol   . Hypothyroidism   . Ventricular ectopy   . Chest pain 05/22/2014  . Left bundle branch block 05/22/2014  . PVC's (premature ventricular contractions) 05/22/2014  . Intrinsic asthma 02/14/2010  . HOARSENESS 02/14/2010  . CHEST PAIN, ATYPICAL 02/14/2010  . ALLERGIC RHINITIS 02/13/2010  . G E R D 02/13/2010    Past Surgical History:  Procedure Laterality Date  . CARDIOVERSION N/A 11/27/2015   Procedure: CARDIOVERSION;  Surgeon: Josue Hector, MD;  Location: [facility] ENDOSCOPY;  Service: Cardiovascular;  Laterality: N/A;  . KNEE ARTHROSCOPY      OB History    No data available       Home Medications    Prior to Admission medications   Medication Sig  Start Date End Date Taking? Authorizing Provider  apixaban (ELIQUIS) 5 MG TABS tablet Take 1 tablet (5 mg total) by mouth 2 (two) times daily. 06/23/17   Josue Hector, MD  Ascorbic Acid (VITAMIN C PO) Take 1 tablet by mouth daily.    [provider]  azelastine (ASTELIN) 0.1 % nasal spray Place 1-2 sprays into both nostrils 2 (two) times daily.  04/18/14   [provider]  Calcium-Magnesium-Zinc (CALCIUM-MAGNESUIUM-ZINC PO) Take 1 tablet by mouth 3 (three) times daily.     [provider]  Cholecalciferol (VITAMIN D PO) Take 1 tablet by mouth daily.     [provider]  denosumab (PROLIA) 60 MG/ML SOLN injection Inject 60 mg into the skin every 6 (six) months. Administer in upper arm, thigh, or abdomen    [provider]  furosemide (LASIX) 40 MG tablet Take 1 tablet (40 mg total) by mouth daily. 10/24/15   Josue Hector, MD  Hypromellose (ARTIFICIAL TEARS OP) Apply to eye.    [provider]  levothyroxine (SYNTHROID, LEVOTHROID) 112 MCG tablet Take 112 mcg by mouth daily. 10/23/16   [provider]  loratadine (CLARITIN) 10 MG tablet Take 10 mg by mouth daily.    [provider]  lovastatin (MEVACOR) 20 MG tablet Take 20 mg by mouth daily at 6 PM.  04/12/14  [provider]  metoprolol succinate (TOPROL-XL) 50 MG 24 hr tablet Take 1 tablet (50 mg total) by mouth daily. 08/31/15   Josue Hector, MD  mometasone-formoterol (DULERA) 100-5 MCG/ACT AERO Inhale 2 puffs into the lungs 2 (two) times daily.    [provider]  montelukast (SINGULAIR) 10 MG tablet Take 10 mg by mouth at bedtime.  04/12/14   [provider]  Multiple Vitamins-Minerals (PRESERVISION AREDS PO) Take by mouth.    [provider]  Omega-3 Fatty Acids (FISH OIL PO) Take 1 capsule by mouth daily.     [provider]  omeprazole (PRILOSEC) 40 MG capsule Take 40 mg by mouth 2 (two) times daily.  04/18/14   [provider]  Polyethylene Glycol 3350 (MIRALAX PO) Take by mouth.    [provider]  potassium chloride (K-DUR) 10 MEQ tablet Take 1 tablet (10 mEq total) by mouth daily. 10/24/15   Josue Hector, MD  predniSONE (DELTASONE) 20 MG tablet Take 2 tablets (40 mg total) by mouth daily with breakfast for 5 days. 08/09/17 08/14/17  Zigmund Gottron, NP  PROAIR HFA 108 (90 Base) MCG/ACT inhaler Inhale 1-2 puffs into the lungs daily as needed. For wheezing & SOB 08/21/15   [provider]  sodium chloride (OCEAN) 0.65 % SOLN nasal spray Place 1 spray into both nostrils 2 (two) times daily.    [provider]  tiZANidine (ZANAFLEX) 4 MG tablet  03/12/17   [provider]    Family History Family History  Problem Relation Age of Onset  . Heart attack Mother   . Throat cancer Father   . Congestive Heart Failure Sister   . Congestive Heart Failure Brother     Social History Social History   Tobacco Use  . Smoking status: Passive Smoke Exposure - Never Smoker  . Smokeless tobacco: Never Used  Substance Use Topics  . Alcohol use: No    Alcohol/week: 0.0 oz  . Drug use: No     Allergies   Erythromycin; Benzalkonium chloride; Neosporin [neomycin-bacitracin zn-polymyx]; and Penicillins   Review of Systems Review of Systems   Physical Exam Triage Vital Signs ED Triage Vitals [08/09/17 1506]  Enc Vitals Group     BP (!) 146/75     Pulse Rate 74     Resp      Temp (!) 97.4 F (36.3 C)     Temp Source Oral     SpO2 98 %     Weight      Height      Head Circumference      Peak Flow      Pain Score      Pain Loc      Pain Edu?      Excl. in [place]?    No data found.  Updated Vital Signs BP (!) 146/75 (BP Location: Left Arm)   Pulse 74   Temp (!) 97.4 F (36.3 C) (Oral)   SpO2 98%   Visual Acuity Right Eye Distance:   Left Eye Distance:   Bilateral Distance:    Right Eye Near:   Left Eye Near:    Bilateral Near:     Physical Exam    Constitutional: She is oriented to person, place, and time. She appears well-developed and well-nourished. No distress.  Cardiovascular: Normal rate, regular rhythm and normal heart sounds.  Pulmonary/Chest: Effort normal and breath sounds normal.  Musculoskeletal:       Right shoulder:  Normal.       Left shoulder: Normal.       Right elbow: Normal.      Left elbow: Normal.       Right wrist: Normal.       Left wrist: Normal.       Cervical back: She exhibits tenderness. She exhibits no bony tenderness.       Back:       Right hand: Normal. Normal sensation noted. Normal strength noted.       Left hand: Normal.  Gross sensation intact to bilateral hands; increased tingling to bilateral hands with phalen's test, to all 5 fingers; without tinel's sign; full ROM to wrists, elbows and shoulder without increased pain; full ROm to neck; right neck musculature tenderness and worse with neck flexion; without spinous process tenderness; strong radial pulses; upper extremities with equal strength bilaterally  Neurological: She is alert and oriented to person, place, and time.  Skin: Skin is warm and dry.     UC Treatments / Results  Labs (all labs ordered are listed, but only abnormal results are displayed) Labs Reviewed - No data to display  EKG  EKG Interpretation None       Radiology No results found.  Procedures Procedures (including critical care time)  Medications Ordered in UC Medications - No data to display   Initial Impression / Assessment and Plan / UC Course  I have reviewed the triage vital signs and the nursing notes.  Pertinent labs & imaging results that were available during my care of the patient were reviewed by me and considered in my medical decision making (see chart for details).     Carpal tunnel vs cubital tunnel vs radiculopathy discussed with patient. Neck musculature with tightness and tenderness as well. Ongoing for weeks. Without any redflag  findings or acute neurological findings. She is on eliquis, will try prednisone x5 days at this time. Encouraged close follow up with PCP as may need further evaluation or treatment if symptoms persist or do not improve. Return precautions provided. Patient and family verbalized understanding and agreeable to plan.  Ambulatory out of clinic without difficulty.    Final Clinical Impressions(s) / UC Diagnoses   Final diagnoses:  Cervical radiculopathy    ED Discharge Orders        Ordered    predniSONE (DELTASONE) 20 MG tablet  Daily with breakfast     08/09/17 1539       Controlled Substance Prescriptions [place] Controlled Substance Registry consulted? Not Applicable   Zigmund Gottron, NP 08/09/17 1551

## 2017-08-09 NOTE — ED Triage Notes (Signed)
Past week when she wakes up in the morning right hands and arm get tingling and numb, Per pt she has A-fib, a bit dizzy, harder to get from on place to another

## 2017-08-09 NOTE — Telephone Encounter (Signed)
Pt called reporting she has been having numbness and tingling into her right arm and hand for about a week. Now with tightness across the upper back. Hx of Afib, and has been on Eliquis without missing any doses. Tried muscle rub without relief. I advised that she seek further evaluation today for this numbness. She feels it may be a pinched nerve but also had some weakness on this side as well. She was agreeable to be checked out and will have her daughter come and get her.   Reino Bellis NP

## 2017-08-12 ENCOUNTER — Ambulatory Visit: Payer: Medicare Other | Admitting: Podiatry

## 2017-08-12 DIAGNOSIS — J029 Acute pharyngitis, unspecified: Secondary | ICD-10-CM | POA: Diagnosis not present

## 2017-08-17 ENCOUNTER — Ambulatory Visit: Payer: Medicare HMO | Admitting: Emergency Medicine

## 2017-08-26 ENCOUNTER — Ambulatory Visit: Payer: Medicare HMO | Admitting: Podiatry

## 2017-08-26 DIAGNOSIS — M79676 Pain in unspecified toe(s): Secondary | ICD-10-CM

## 2017-08-26 DIAGNOSIS — B351 Tinea unguium: Secondary | ICD-10-CM | POA: Diagnosis not present

## 2017-08-26 DIAGNOSIS — L989 Disorder of the skin and subcutaneous tissue, unspecified: Secondary | ICD-10-CM | POA: Diagnosis not present

## 2017-08-31 ENCOUNTER — Ambulatory Visit (INDEPENDENT_AMBULATORY_CARE_PROVIDER_SITE_OTHER)
Admission: RE | Admit: 2017-08-31 | Discharge: 2017-08-31 | Disposition: A | Discharge status: Home / Self Care | Payer: Medicare HMO | Source: Ambulatory Visit | Attending: Emergency Medicine | Admitting: Emergency Medicine

## 2017-08-31 DIAGNOSIS — J849 Interstitial pulmonary disease, unspecified: Secondary | ICD-10-CM | POA: Diagnosis not present

## 2017-09-01 ENCOUNTER — Other Ambulatory Visit: Payer: Medicare HMO

## 2017-09-01 NOTE — Progress Notes (Signed)
    Subjective: Patient is a 84 y.o. female presenting to the office today as a new patient with a chief complaint of a painful callus lesion to the plantar aspect of the right foot that has been present for the past 3-4 months. She has been using corn pads for treatment with some relief. Walking and bearing weight increase the pain.   Patient also complains of elongated, thickened nails that cause pain while ambulating in shoes. She is unable to trim her own nails. Patient presents today for further treatment and evaluation.  Past Medical History:  Diagnosis Date  . Arthritis   . Asthma   . GERD (gastroesophageal reflux disease)   . High cholesterol   . Hypothyroidism   . LBBB (left bundle branch block)   . Ventricular ectopy     Objective:  Physical Exam General: Alert and oriented x3 in no acute distress  Dermatology: Hyperkeratotic lesion present on the right sub-first MPJ. Pain on palpation with a central nucleated core noted. Skin is warm, dry and supple bilateral lower extremities. Negative for open lesions or macerations. Nails are tender, long, thickened and dystrophic with subungual debris, consistent with onychomycosis, 1-5 bilateral. No signs of infection noted.  Vascular: Palpable pedal pulses bilaterally. No edema or erythema noted. Capillary refill within normal limits.  Neurological: Epicritic and protective threshold grossly intact bilaterally.   Musculoskeletal Exam: Pain on palpation at the keratotic lesion noted. Range of motion within normal limits bilateral. Muscle strength 5/5 in all groups bilateral.  Assessment: 1. Onychodystrophic nails 1-5 bilateral with hyperkeratosis of nails.  2. Onychomycosis of nail due to dermatophyte bilateral 3. 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 #2 Excisional debridement of keratoic lesion using a chisel blade was performed without incident.  #3 Dressed with light dressing. #4  Mechanical debridement of nails 1-5 bilaterally performed using a nail nipper. Filed with dremel without incident.  #5 Patient is to return to the clinic in 1 month just before she visits family in [place], [place].   Edrick Kins, DPM [facility]  Dr. Edrick Kins, [place]                                        [place], [place] [address]                Office [phone]  Fax [phone]

## 2017-09-02 ENCOUNTER — Ambulatory Visit: Payer: Medicare HMO | Admitting: Emergency Medicine

## 2017-09-02 ENCOUNTER — Encounter: Payer: Self-pay | Admitting: Emergency Medicine

## 2017-09-02 DIAGNOSIS — R9389 Abnormal findings on diagnostic imaging of other specified body structures: Secondary | ICD-10-CM

## 2017-09-02 DIAGNOSIS — J45909 Unspecified asthma, uncomplicated: Secondary | ICD-10-CM

## 2017-09-02 NOTE — Patient Instructions (Addendum)
Your CT scan shows improvement compared with your priors. You should not need another scan unless there is a change in how you feel.  We will continue your Dulera 2 puffs twice a day. Rinse and gargle after using Keep albuterol available to use 2 puffs up to every 4 hours if needed for shortness of breath.  Follow with Dr Donneta Romberg as planned.  Follow with Dr Lamonte Sakai if needed for any changes in your breathing

## 2017-09-02 NOTE — Assessment & Plan Note (Signed)
No evidence significant abnormality now. No longer on amiodarone.   Your CT scan shows improvement compared with your priors. You should not need another scan unless there is a change in how you feel.

## 2017-09-02 NOTE — Assessment & Plan Note (Signed)
Normal spirometry, but apparent clinical benefit from dulera, follows w Dr Donneta Romberg  We will continue your [facility] 2 puffs twice a day. Rinse and gargle after using Keep albuterol available to use 2 puffs up to every 4 hours if needed for shortness of breath.  Follow with Dr Donneta Romberg as planned.  Follow with Dr Lamonte Sakai if needed for any changes in your breathing

## 2017-09-02 NOTE — Progress Notes (Signed)
Subjective:    Patient ID: Samantha [name], female    DOB: 11/20/1929, 84 y.o.   MRN: [id number]  HPI 84 year old never smoker with a hx A Fib, GERD, allergic rhinitis, hypothyroidism. She carries a dx of asthma made as an adult. She has been treated with [facility] by Dr Donneta Romberg.   She is referred for evaluation of abnormal PFT and to review CT chest.   She had been on amiodarone for about a year, until 07/10/16 when this was stopped due to decreased DLCO on PFT. I have personally reviewed. This shows normal spirometry, restricted lung volumes, decreased diffusion capacity that does not completely correct when adjusted for [place]. A high-resolution CT scan of the chest was done on 07/14/16 that I have reviewed. This shows very subtle scattered groundglass attenuation without any evidence for honeycomb change, traction bronchiectasis or frank scarring. Overall the parenchyma appears to be quite benign. There is a 3 mm right upper lobe nodule, a 84mm cluster of centrilobular nodularity in the medial RML. Both are subtle findings.    She is on [facility], takes reliably. Feels that it does help her some. She describes exertional dyspnea that happens with walking, any kind of chores or housework. She does not wheeze. She does not recall ever having an AE-asthma. She has gained weight -   ROV 09/02/17 --this is a follow-up visit for patient with a history of atrial fibrillation, allergic rhinitis, GERD and probable adult asthma although she has normal spirometry in the past.  She returns today to review her CT scan of the chest which I have reviewed. Shows no significant ILD, resolution of some very mild pulmonary nodular disease. She is on [facility], does not need or use albuterol. She gets less exercise now than she used to.    Review of Systems  Constitutional: Negative for fever and unexpected weight change.  HENT: Positive for postnasal drip. Negative for congestion, dental problem, ear pain, nosebleeds, rhinorrhea,  sinus pressure, sneezing, sore throat and trouble swallowing.   Eyes: Negative for redness and itching.  Respiratory: Positive for cough and shortness of breath. Negative for chest tightness and wheezing.   Cardiovascular: Positive for leg swelling. Negative for palpitations.  Gastrointestinal: Negative for nausea and vomiting.  Genitourinary: Negative for dysuria.  Musculoskeletal: Negative for joint swelling.  Skin: Negative for rash.  Neurological: Negative for headaches.  Hematological: Bruises/bleeds easily.  Psychiatric/Behavioral: Negative for dysphoric mood. The patient is not nervous/anxious.     Past Medical History:  Diagnosis Date  . Arthritis   . Asthma   . GERD (gastroesophageal reflux disease)   . High cholesterol   . Hypothyroidism   . LBBB (left bundle branch block)   . Ventricular ectopy      Family History  Problem Relation Age of Onset  . Heart attack Mother   . Throat cancer Father   . Congestive Heart Failure Sister   . Congestive Heart Failure Brother      Social History   Socioeconomic History  . Marital status: Widowed    Spouse name: Not on file  . Number of children: Not on file  . Years of education: Not on file  . Highest education level: Not on file  Occupational History  . Not on file  Social Needs  . Financial resource strain: Not on file  . Food insecurity:    Worry: Not on file    Inability: Not on file  . Transportation needs:    Medical: Not on  file    Non-medical: Not on file  Tobacco Use  . Smoking status: Passive Smoke Exposure - Never Smoker  . Smokeless tobacco: Never Used  Substance and Sexual Activity  . Alcohol use: No    Alcohol/week: 0.0 oz  . Drug use: No  . Sexual activity: Not on file  Lifestyle  . Physical activity:    Days per week: Not on file    Minutes per session: Not on file  . Stress: Not on file  Relationships  . Social connections:    Talks on phone: Not on file    Gets together: Not on file      Attends religious service: Not on file    Active member of club or organization: Not on file    Attends meetings of clubs or organizations: Not on file    Relationship status: Not on file  . Intimate partner violence:    Fear of current or ex partner: Not on file    Emotionally abused: Not on file    Physically abused: Not on file    Forced sexual activity: Not on file  Other Topics Concern  . Not on file  Social History Narrative  . Not on file     Allergies  Allergen Reactions  . Erythromycin Diarrhea  . Benzalkonium Chloride Rash  . Neosporin [Neomycin-Bacitracin Zn-Polymyx] Rash  . Penicillins Rash     Outpatient Medications Prior to Visit  Medication Sig Dispense Refill  . apixaban (ELIQUIS) 5 MG TABS tablet Take 1 tablet (5 mg total) by mouth 2 (two) times daily. 180 tablet 1  . Ascorbic Acid (VITAMIN C PO) Take 1 tablet by mouth daily.    Marland Kitchen azelastine (ASTELIN) 0.1 % nasal spray Place 1-2 sprays into both nostrils 2 (two) times daily.     . Calcium-Magnesium-Zinc (CALCIUM-MAGNESUIUM-ZINC PO) Take 1 tablet by mouth 3 (three) times daily.     . Cholecalciferol (VITAMIN D PO) Take 1 tablet by mouth daily.     . furosemide (LASIX) 40 MG tablet Take 1 tablet (40 mg total) by mouth daily. 90 tablet 3  . Hypromellose (ARTIFICIAL TEARS OP) Apply to eye.    . levothyroxine (SYNTHROID, LEVOTHROID) 112 MCG tablet Take 112 mcg by mouth daily.    Marland Kitchen loratadine (CLARITIN) 10 MG tablet Take 10 mg by mouth daily.    Marland Kitchen lovastatin (MEVACOR) 20 MG tablet Take 20 mg by mouth daily at 6 PM.     . metoprolol succinate (TOPROL-XL) 50 MG 24 hr tablet Take 1 tablet (50 mg total) by mouth daily. 90 tablet 3  . mometasone-formoterol (DULERA) 100-5 MCG/ACT AERO Inhale 2 puffs into the lungs 2 (two) times daily.    . montelukast (SINGULAIR) 10 MG tablet Take 10 mg by mouth at bedtime.     . Omega-3 Fatty Acids (FISH OIL PO) Take 1 capsule by mouth daily.     Marland Kitchen omeprazole (PRILOSEC) 40 MG capsule  Take 40 mg by mouth 2 (two) times daily.     . Polyethylene Glycol 3350 (MIRALAX PO) Take by mouth.    . potassium chloride (K-DUR) 10 MEQ tablet Take 1 tablet (10 mEq total) by mouth daily. 90 tablet 3  . PROAIR HFA 108 (90 Base) MCG/ACT inhaler Inhale 1-2 puffs into the lungs daily as needed. For wheezing & SOB    . sodium chloride (OCEAN) 0.65 % SOLN nasal spray Place 1 spray into both nostrils 2 (two) times daily.    Marland Kitchen denosumab (PROLIA)  60 MG/ML SOLN injection Inject 60 mg into the skin every 6 (six) months. Administer in upper arm, thigh, or abdomen    . Multiple Vitamins-Minerals (PRESERVISION AREDS PO) Take by mouth.    Marland Kitchen tiZANidine (ZANAFLEX) 4 MG tablet      No facility-administered medications prior to visit.         Objective:   Physical Exam Vitals:   09/02/17 1632  BP: 118/72  Pulse: 78  SpO2: 97%  Weight: 150 lb (68 kg)  Height: 5\' 2"  (1.575 m)   Gen: Pleasant, elderly overwt, in no distress,  normal affect  ENT: No lesions,  mouth clear,  oropharynx clear, no postnasal drip  Neck: No JVD, no TMG, no carotid bruits  Lungs: No use of accessory muscles, clear without rales or rhonchi, no crackles  Cardiovascular: irregular,  no peripheral edema  Musculoskeletal: No deformities, no cyanosis or clubbing  Neuro: alert, non focal  Skin: Warm, no lesions or rashes       Assessment & Plan:  Intrinsic asthma Normal spirometry, but apparent clinical benefit from dulera, follows w Dr Donneta Romberg  We will continue your [facility] 2 puffs twice a day. Rinse and gargle after using Keep albuterol available to use 2 puffs up to every 4 hours if needed for shortness of breath.  Follow with Dr Donneta Romberg as planned.  Follow with Dr Lamonte Sakai if needed for any changes in your breathing  Abnormal CT scan, chest No evidence significant abnormality now. No longer on amiodarone.   Your CT scan shows improvement compared with your priors. You should not need another scan unless there is  a change in how you feel.   Baltazar Apo, MD, PhD 09/02/2017, 5:01 PM [facility] [phone] or if no answer [phone]

## 2017-09-07 DIAGNOSIS — I480 Paroxysmal atrial fibrillation: Secondary | ICD-10-CM | POA: Diagnosis not present

## 2017-09-07 DIAGNOSIS — R202 Paresthesia of skin: Secondary | ICD-10-CM | POA: Diagnosis not present

## 2017-09-08 DIAGNOSIS — G629 Polyneuropathy, unspecified: Secondary | ICD-10-CM | POA: Diagnosis not present

## 2017-09-08 DIAGNOSIS — G5600 Carpal tunnel syndrome, unspecified upper limb: Secondary | ICD-10-CM | POA: Diagnosis not present

## 2017-09-23 ENCOUNTER — Ambulatory Visit: Payer: Medicare HMO | Admitting: Podiatry

## 2017-09-23 ENCOUNTER — Encounter: Payer: Self-pay | Admitting: Podiatry

## 2017-09-23 DIAGNOSIS — B351 Tinea unguium: Secondary | ICD-10-CM

## 2017-09-23 DIAGNOSIS — M79676 Pain in unspecified toe(s): Secondary | ICD-10-CM

## 2017-09-23 DIAGNOSIS — L989 Disorder of the skin and subcutaneous tissue, unspecified: Secondary | ICD-10-CM

## 2017-09-27 NOTE — Progress Notes (Signed)
    Subjective: Patient is a 84 y.o. female presenting to the office today with a chief complaint of a painful callus lesion to the right foot that has been present for several months. Bearing weight and walking increases the pain. She has not done anything for treatment at home.   Patient also complains of elongated, thickened nails that cause pain while ambulating in shoes. She is unable to trim her own nails. Patient presents today for further treatment and evaluation.  Past Medical History:  Diagnosis Date  . Arthritis   . Asthma   . GERD (gastroesophageal reflux disease)   . High cholesterol   . Hypothyroidism   . LBBB (left bundle branch block)   . Ventricular ectopy     Objective:  Physical Exam General: Alert and oriented x3 in no acute distress  Dermatology: Hyperkeratotic lesion present on the right foot. Pain on palpation with a central nucleated core noted. Skin is warm, dry and supple bilateral lower extremities. Negative for open lesions or macerations. Nails are tender, long, thickened and dystrophic with subungual debris, consistent with onychomycosis, 1-5 bilateral. No signs of infection noted.  Vascular: Palpable pedal pulses bilaterally. No edema or erythema noted. Capillary refill within normal limits.  Neurological: Epicritic and protective threshold grossly intact bilaterally.   Musculoskeletal Exam: Pain on palpation at the keratotic lesion noted. Range of motion within normal limits bilateral. Muscle strength 5/5 in all groups bilateral.  Assessment: 1. Onychodystrophic nails 1-5 bilateral with hyperkeratosis of nails.  2. Onychomycosis of nail due to dermatophyte bilateral 3. Porokeratosis to the right foot    Plan of Care:  1. Patient evaluated. 2. Excisional debridement of keratoic lesion using a chisel blade was performed without incident.  3. Dressed with light dressing. 4. Mechanical debridement of nails 1-5 bilaterally performed using a nail  nipper. Filed with dremel without incident.  5. Patient is to return to the clinic as needed .   Edrick Kins, DPM [facility]  Dr. Edrick Kins, [place]                                        [place], [place] [address]                Office [phone]  Fax [phone]

## 2017-09-28 DIAGNOSIS — Z79899 Other long term (current) drug therapy: Secondary | ICD-10-CM | POA: Diagnosis not present

## 2017-09-28 DIAGNOSIS — R600 Localized edema: Secondary | ICD-10-CM | POA: Diagnosis not present

## 2017-09-28 DIAGNOSIS — G5603 Carpal tunnel syndrome, bilateral upper limbs: Secondary | ICD-10-CM | POA: Diagnosis not present

## 2017-09-28 DIAGNOSIS — I872 Venous insufficiency (chronic) (peripheral): Secondary | ICD-10-CM | POA: Diagnosis not present

## 2017-11-15 ENCOUNTER — Ambulatory Visit (HOSPITAL_COMMUNITY)
Admission: EM | Admit: 2017-11-15 | Discharge: 2017-11-15 | Disposition: A | Discharge status: Home / Self Care | Payer: Medicare HMO | Attending: Emergency Medicine | Admitting: Emergency Medicine

## 2017-11-15 ENCOUNTER — Encounter (HOSPITAL_COMMUNITY): Payer: Self-pay | Admitting: Family Medicine

## 2017-11-15 DIAGNOSIS — S80861A Insect bite (nonvenomous), right lower leg, initial encounter: Secondary | ICD-10-CM

## 2017-11-15 DIAGNOSIS — W57XXXA Bitten or stung by nonvenomous insect and other nonvenomous arthropods, initial encounter: Secondary | ICD-10-CM | POA: Diagnosis not present

## 2017-11-15 MED ORDER — HYDROCORTISONE 2.5 % EX CREA: TOPICAL_CREAM | Freq: Two times a day (BID) | CUTANEOUS | 0 refills | Status: DC

## 2017-11-15 MED ORDER — DOXYCYCLINE HYCLATE 100 MG PO CAPS: 100.0000 mg | ORAL_CAPSULE | Freq: Two times a day (BID) | ORAL | 0 refills | Status: AC

## 2017-11-15 NOTE — ED Provider Notes (Signed)
[place]    CSN: [id number] Arrival date & time: 11/15/17  1300     History   Chief Complaint Chief Complaint  Patient presents with  . Insect Bite    HPI Samantha [name] is a 84 y.o. female history of arthritis, asthma, hyperlipidemia, presenting today for evaluation of tick bite.  Patient states that she was bit by a tick and noticed this yesterday.  She pulled it off with tweezers.  Since she has had redness, swelling, pain and itching to her posterior right leg.  She is unsure if she got the entire tick out.  Concerned about infection.  Denies headache.  Does have arthralgias, but this is normal for her.  HPI  Past Medical History:  Diagnosis Date  . Arthritis   . Asthma   . GERD (gastroesophageal reflux disease)   . High cholesterol   . Hypothyroidism   . LBBB (left bundle branch block)   . Ventricular ectopy     Patient Active Problem List   Diagnosis Date Noted  . Abnormal CT scan, chest 08/08/2016  . Near syncope 01/06/2015  . Left leg weakness 01/06/2015  . High cholesterol   . Hypothyroidism   . Ventricular ectopy   . Chest pain 05/22/2014  . Left bundle branch block 05/22/2014  . PVC's (premature ventricular contractions) 05/22/2014  . Intrinsic asthma 02/14/2010  . HOARSENESS 02/14/2010  . CHEST PAIN, ATYPICAL 02/14/2010  . ALLERGIC RHINITIS 02/13/2010  . G E R D 02/13/2010    Past Surgical History:  Procedure Laterality Date  . CARDIOVERSION N/A 11/27/2015   Procedure: CARDIOVERSION;  Surgeon: Josue Hector, MD;  Location: [facility] ENDOSCOPY;  Service: Cardiovascular;  Laterality: N/A;  . KNEE ARTHROSCOPY      OB History   None      Home Medications    Prior to Admission medications   Medication Sig Start Date End Date Taking? Authorizing Provider  apixaban (ELIQUIS) 5 MG TABS tablet Take 1 tablet (5 mg total) by mouth 2 (two) times daily. 06/23/17   Josue Hector, MD  Ascorbic Acid (VITAMIN C PO) Take 1 tablet by mouth daily.     [provider]  azelastine (ASTELIN) 0.1 % nasal spray Place 1-2 sprays into both nostrils 2 (two) times daily.  04/18/14   [provider]  Calcium-Magnesium-Zinc (CALCIUM-MAGNESUIUM-ZINC PO) Take 1 tablet by mouth 3 (three) times daily.     [provider]  Cholecalciferol (VITAMIN D PO) Take 1 tablet by mouth daily.     [provider]  denosumab (PROLIA) 60 MG/ML SOLN injection Inject 60 mg into the skin every 6 (six) months. Administer in upper arm, thigh, or abdomen    [provider]  doxycycline (VIBRAMYCIN) 100 MG capsule Take 1 capsule (100 mg total) by mouth 2 (two) times daily for 10 days. 11/15/17 11/25/17  Yasmene Salomone C, PA-C  furosemide (LASIX) 40 MG tablet Take 1 tablet (40 mg total) by mouth daily. 10/24/15   Josue Hector, MD  hydrocortisone 2.5 % cream Apply topically 2 (two) times daily. 11/15/17   Jasen Hartstein C, PA-C  Hypromellose (ARTIFICIAL TEARS OP) Apply to eye.    [provider]  levothyroxine (SYNTHROID, LEVOTHROID) 112 MCG tablet Take 112 mcg by mouth daily. 10/23/16   [provider]  loratadine (CLARITIN) 10 MG tablet Take 10 mg by mouth daily.    [provider]  lovastatin (MEVACOR) 20 MG tablet Take 20 mg by mouth daily  at 6 PM.  04/12/14   [provider]  metoprolol succinate (TOPROL-XL) 50 MG 24 hr tablet Take 1 tablet (50 mg total) by mouth daily. 08/31/15   Josue Hector, MD  mometasone-formoterol (DULERA) 100-5 MCG/ACT AERO Inhale 2 puffs into the lungs 2 (two) times daily.    [provider]  montelukast (SINGULAIR) 10 MG tablet Take 10 mg by mouth at bedtime.  04/12/14   [provider]  Omega-3 Fatty Acids (FISH OIL PO) Take 1 capsule by mouth daily.     [provider]  omeprazole (PRILOSEC) 40 MG capsule Take 40 mg by mouth 2 (two) times daily.  04/18/14   [provider]  Polyethylene Glycol 3350 (MIRALAX PO) Take by mouth.     [provider]  potassium chloride (K-DUR) 10 MEQ tablet Take 1 tablet (10 mEq total) by mouth daily. 10/24/15   Josue Hector, MD  PROAIR HFA 108 [phone] Base) MCG/ACT inhaler Inhale 1-2 puffs into the lungs daily as needed. For wheezing & SOB 08/21/15   [provider]  sodium chloride (OCEAN) 0.65 % SOLN nasal spray Place 1 spray into both nostrils 2 (two) times daily.    [provider]    Family History Family History  Problem Relation Age of Onset  . Heart attack Mother   . Throat cancer Father   . Congestive Heart Failure Sister   . Congestive Heart Failure Brother     Social History Social History   Tobacco Use  . Smoking status: Passive Smoke Exposure - Never Smoker  . Smokeless tobacco: Never Used  Substance Use Topics  . Alcohol use: No    Alcohol/week: 0.0 oz  . Drug use: No     Allergies   Erythromycin; Benzalkonium chloride; Neosporin [neomycin-bacitracin zn-polymyx]; and Penicillins   Review of Systems Review of Systems  Constitutional: Negative for fatigue and fever.  Eyes: Negative for visual disturbance.  Respiratory: Negative for shortness of breath.   Cardiovascular: Negative for chest pain.  Gastrointestinal: Negative for abdominal pain, nausea and vomiting.  Musculoskeletal: Positive for arthralgias. Negative for joint swelling.  Skin: Positive for color change. Negative for rash and wound.  Neurological: Negative for dizziness, weakness, light-headedness and headaches.     Physical Exam Triage Vital Signs ED Triage Vitals  Enc Vitals Group     BP 11/15/17 1328 (!) 148/73     Pulse Rate 11/15/17 1328 70     Resp 11/15/17 1328 18     Temp 11/15/17 1328 98.1 F (36.7 C)     Temp src --      SpO2 11/15/17 1328 98 %     Weight --      Height --      Head Circumference --      Peak Flow --      Pain Score 11/15/17 1327 4     Pain Loc --      Pain Edu? --      Excl. in [place]? --    No data found.  Updated Vital  Signs BP (!) 148/73   Pulse 70   Temp 98.1 F (36.7 C)   Resp 18   SpO2 98%   Visual Acuity Right Eye Distance:   Left Eye Distance:   Bilateral Distance:    Right Eye Near:   Left Eye Near:    Bilateral Near:     Physical Exam  Constitutional: She appears well-developed and well-nourished. No distress.  HENT:  Head: Normocephalic and atraumatic.  Eyes: Conjunctivae are normal.  Neck: Neck supple.  Cardiovascular: Normal rate.  Pulmonary/Chest: Effort normal. No respiratory distress.  Abdominal: Soft. There is no tenderness.  Musculoskeletal: She exhibits no edema.  Neurological: She is alert.  Skin: Skin is warm and dry. There is erythema.  Circular erythema with mild swelling surrounding to right posterior leg just distal to popliteal area.  Tenderness to palpation, no obvious swelling to right leg compared to left.  Sensation intact distally, cap refill less than 2 seconds.  Psychiatric: She has a normal mood and affect.  Nursing note and vitals reviewed.    UC Treatments / Results  Labs (all labs ordered are listed, but only abnormal results are displayed) Labs Reviewed - No data to display  EKG None  Radiology No results found.  Procedures Procedures (including critical care time)  Medications Ordered in UC Medications - No data to display  Initial Impression / Assessment and Plan / UC Course  I have reviewed the triage vital signs and the nursing notes.  Pertinent labs & imaging results that were available during my care of the patient were reviewed by me and considered in my medical decision making (see chart for details).     Tick bite, local dermatitis versus cellulitis.  We will go and initiate treatment with doxycycline twice daily for 10 days.  Hydrocortisone cream to apply to help with itching and local reaction.Discussed strict return precautions. Patient verbalized understanding and is agreeable with plan.  Final Clinical Impressions(s) / UC  Diagnoses   Final diagnoses:  Tick bite, initial encounter     Discharge Instructions     Begin doxycycline Hydrocortisone cream twice daily Warm compresses Daily zyrtec  Monitor redness, swelling, pain, drainage, return if developing these symptoms or worsening   ED Prescriptions    Medication Sig Dispense Auth. Provider   doxycycline (VIBRAMYCIN) 100 MG capsule Take 1 capsule (100 mg total) by mouth 2 (two) times daily for 10 days. 20 capsule Aubri Gathright C, PA-C   hydrocortisone 2.5 % cream Apply topically 2 (two) times daily. 30 g Niala Stcharles, [place] C, PA-C     Controlled Substance Prescriptions [place] Controlled Substance Registry consulted? Not Applicable   Janith Lima, [place] 11/15/17 1429

## 2017-11-15 NOTE — ED Triage Notes (Signed)
Pt here for tick bite to the back of the right leg. She noticed it last night and attempted to pull it off with tweezers. She believes that she got it all but it is hard for her to see the back of her leg. She said red, swollen, tender and itchy.

## 2017-11-15 NOTE — Discharge Instructions (Signed)
Begin doxycycline Hydrocortisone cream twice daily Warm compresses Daily zyrtec  Monitor redness, swelling, pain, drainage, return if developing these symptoms or worsening

## 2017-12-09 DIAGNOSIS — M25562 Pain in left knee: Secondary | ICD-10-CM | POA: Diagnosis not present

## 2017-12-10 ENCOUNTER — Encounter (INDEPENDENT_AMBULATORY_CARE_PROVIDER_SITE_OTHER): Payer: Self-pay | Admitting: Orthopaedic Surgery

## 2017-12-10 ENCOUNTER — Ambulatory Visit (INDEPENDENT_AMBULATORY_CARE_PROVIDER_SITE_OTHER): Payer: Medicare HMO | Admitting: Orthopaedic Surgery

## 2017-12-10 VITALS — BP 128/67 | HR 81 | Ht 62.0 in | Wt 150.0 lb

## 2017-12-10 DIAGNOSIS — M25562 Pain in left knee: Secondary | ICD-10-CM | POA: Diagnosis not present

## 2017-12-10 MED ORDER — LIDOCAINE HCL 1 % IJ SOLN
2.0000 mL | INTRAMUSCULAR | Status: AC | PRN
  Administered 2017-12-10: 2 mL

## 2017-12-10 MED ORDER — METHYLPREDNISOLONE ACETATE 40 MG/ML IJ SUSP
80.0000 mg | INTRAMUSCULAR | Status: AC | PRN
  Administered 2017-12-10: 80 mg

## 2017-12-10 MED ORDER — BUPIVACAINE HCL 0.5 % IJ SOLN
2.0000 mL | INTRAMUSCULAR | Status: AC | PRN
  Administered 2017-12-10: 2 mL via INTRA_ARTICULAR

## 2017-12-10 NOTE — Progress Notes (Signed)
Office Visit Note   Patient: Samantha [name]           Date of Birth: Aug 08, 1929           MRN: [id number] Visit Date: 12/10/2017              Requested by: Lajean Manes, MD [address] [place], [place] [address] PCP: Lajean Manes, MD   Assessment & Plan: Visit Diagnoses:  1. Acute pain of left knee     Plan: Acute flareup of chondrocalcinosis left knee.  I aspirated 22 cc of cloudy fluid and injected cortisone with Xylocaine.  Patient had immediate relief of her pain.  Will reevaluate over the next 1 to 2 weeks if she continues to have pain.  Prior x-rays demonstrated some arthritic changes but diffuse calcification of the menisci consistent with chondrocalcinosis.  Fluid sent to lab today  Follow-Up Instructions: Return if symptoms worsen or fail to improve.   Orders:  Orders Placed This Encounter  Procedures  . Large Joint Inj: L knee   No orders of the defined types were placed in this encounter.     Procedures: Large Joint Inj: L knee on 12/10/2017 1:40 PM Indications: pain and diagnostic evaluation Details: 25 G 1.5 in needle, anteromedial approach  Arthrogram: No  Medications: 2 mL lidocaine 1 %; 2 mL bupivacaine 0.5 %; 80 mg methylPREDNISolone acetate 40 MG/ML Aspirate: 22 mL cloudy and yellow; sent for lab analysis Procedure, treatment alternatives, risks and benefits explained, specific risks discussed. Consent was given by the patient. Patient was prepped and draped in the usual sterile fashion.       Clinical Data: No additional findings.   Subjective: Chief Complaint  Patient presents with  . New Patient (Initial Visit)    1 WK L KNEE PAIN USING MUSCLE RUB, PCP TOLD HER SHE HAD ARTHIRITIS  Insidious onset of left knee pain nearly a week ago.  Samantha [name] relates that she is had significant pain and difficulty ambulating.  She uses a cane in her right hand.  She is just been "miserable".  She has had difficulty sleeping and  ambulating.  Films in November demonstrated some arthritic changes but diffuse calcification of the menisci consistent with chondrocalcinosis.  I suspect she is had an acute flareup of her calcium pyrophosphate deposition  HPI  Review of Systems  Constitutional: Negative for fatigue and fever.  HENT: Negative for ear pain.   Eyes: Negative for pain.  Respiratory: Negative for cough and shortness of breath.   Cardiovascular: Positive for leg swelling.  Gastrointestinal: Negative for constipation and diarrhea.  Genitourinary: Negative for difficulty urinating.  Musculoskeletal: Negative for back pain and neck pain.  Skin: Negative for rash.  Allergic/Immunologic: Negative for food allergies.  Neurological: Positive for weakness. Negative for numbness.  Hematological: Bruises/bleeds easily.  Psychiatric/Behavioral: Positive for sleep disturbance.     Objective: Vital Signs: BP 128/67 (BP Location: Left Arm, Patient Position: Sitting, Cuff Size: Normal)   Pulse 81   Ht 5\' 2"  (1.575 m)   Wt 150 lb (68 kg)   BMI 27.44 kg/m   Physical Exam  Constitutional: She is oriented to person, place, and time. She appears well-developed and well-nourished.  HENT:  Mouth/Throat: Oropharynx is clear and moist.  Eyes: Pupils are equal, round, and reactive to light. EOM are normal.  Pulmonary/Chest: Effort normal.  Neurological: She is alert and oriented to person, place, and time.  Skin: Skin is warm and dry.  Psychiatric: She has a normal mood and affect. Her behavior is normal.    Ortho Exam awake alert and oriented x3.  Comfortable sitting but difficulty flexing and extending left knee related to pain and swelling.  She does use a cane.  There is a large effusion of her left knee with diffuse tenderness.  The left knee was slightly warm compared to the right knee.  Lacks full extension and flexed only about 90 degrees.  No calf pain.  No popliteal fullness.  After aspiration and injection of  Xylocaine and Depo-Medrol the knee was considerably "better" Specialty Comments:  No specialty comments available.  Imaging: No results found.   PMFS History: Patient Active Problem List   Diagnosis Date Noted  . Abnormal CT scan, chest 08/08/2016  . Near syncope 01/06/2015  . Left leg weakness 01/06/2015  . High cholesterol   . Hypothyroidism   . Ventricular ectopy   . Chest pain 05/22/2014  . Left bundle branch block 05/22/2014  . PVC's (premature ventricular contractions) 05/22/2014  . Intrinsic asthma 02/14/2010  . HOARSENESS 02/14/2010  . CHEST PAIN, ATYPICAL 02/14/2010  . ALLERGIC RHINITIS 02/13/2010  . G E R D 02/13/2010   Past Medical History:  Diagnosis Date  . Arthritis   . Asthma   . GERD (gastroesophageal reflux disease)   . High cholesterol   . Hypothyroidism   . LBBB (left bundle branch block)   . Ventricular ectopy     Family History  Problem Relation Age of Onset  . Heart attack Mother   . Throat cancer Father   . Congestive Heart Failure Sister   . Congestive Heart Failure Brother     Past Surgical History:  Procedure Laterality Date  . CARDIOVERSION N/A 11/27/2015   Procedure: CARDIOVERSION;  Surgeon: Josue Hector, MD;  Location: [facility] ENDOSCOPY;  Service: Cardiovascular;  Laterality: N/A;  . KNEE ARTHROSCOPY     Social History   Occupational History  . Not on file  Tobacco Use  . Smoking status: Passive Smoke Exposure - Never Smoker  . Smokeless tobacco: Never Used  Substance and Sexual Activity  . Alcohol use: No    Alcohol/week: 0.0 oz  . Drug use: No  . Sexual activity: Not on file

## 2017-12-11 LAB — SYNOVIAL CELL COUNT + DIFF, W/ CRYSTALS
Basophils, %: 0 %
EOSINOPHILS-SYNOVIAL: 0 % (ref 0–2)
LYMPHOCYTES-SYNOVIAL FLD: 0 % (ref 0–74)
Monocyte/Macrophage: 37 % (ref 0–69)
NEUTROPHIL, SYNOVIAL: 63 % — AB (ref 0–24)
Synoviocytes, %: 0 % (ref 0–15)
WBC, SYNOVIAL: 5204 {cells}/uL — AB (ref <150)

## 2017-12-15 ENCOUNTER — Ambulatory Visit (INDEPENDENT_AMBULATORY_CARE_PROVIDER_SITE_OTHER): Payer: Medicare HMO | Admitting: Orthopaedic Surgery

## 2017-12-18 ENCOUNTER — Telehealth (INDEPENDENT_AMBULATORY_CARE_PROVIDER_SITE_OTHER): Payer: Self-pay | Admitting: Orthopaedic Surgery

## 2017-12-18 ENCOUNTER — Other Ambulatory Visit (INDEPENDENT_AMBULATORY_CARE_PROVIDER_SITE_OTHER): Payer: Self-pay | Admitting: Radiology

## 2017-12-18 DIAGNOSIS — I480 Paroxysmal atrial fibrillation: Secondary | ICD-10-CM | POA: Diagnosis not present

## 2017-12-18 DIAGNOSIS — I509 Heart failure, unspecified: Secondary | ICD-10-CM | POA: Diagnosis not present

## 2017-12-18 DIAGNOSIS — E039 Hypothyroidism, unspecified: Secondary | ICD-10-CM | POA: Diagnosis not present

## 2017-12-18 DIAGNOSIS — J4530 Mild persistent asthma, uncomplicated: Secondary | ICD-10-CM | POA: Diagnosis not present

## 2017-12-18 DIAGNOSIS — E7800 Pure hypercholesterolemia, unspecified: Secondary | ICD-10-CM | POA: Diagnosis not present

## 2017-12-18 DIAGNOSIS — M810 Age-related osteoporosis without current pathological fracture: Secondary | ICD-10-CM | POA: Diagnosis not present

## 2017-12-18 DIAGNOSIS — M818 Other osteoporosis without current pathological fracture: Secondary | ICD-10-CM | POA: Diagnosis not present

## 2017-12-18 MED ORDER — TRAMADOL HCL 50 MG PO TABS: ORAL_TABLET | ORAL | 0 refills | Status: DC

## 2017-12-18 NOTE — Telephone Encounter (Signed)
Per dr whitfield called in tramadol 50 mg and notified pt.

## 2017-12-18 NOTE — Telephone Encounter (Signed)
Patient scheduled a follow-up appointment with Dr. Durward Fortes on Tuesday, 12/22/17 at 2:15 pm.  Patient states her knee is still painful with walking and when she tries to sleep.

## 2017-12-21 DIAGNOSIS — H5203 Hypermetropia, bilateral: Secondary | ICD-10-CM | POA: Diagnosis not present

## 2017-12-21 DIAGNOSIS — Z961 Presence of intraocular lens: Secondary | ICD-10-CM | POA: Diagnosis not present

## 2017-12-21 DIAGNOSIS — H353132 Nonexudative age-related macular degeneration, bilateral, intermediate dry stage: Secondary | ICD-10-CM | POA: Diagnosis not present

## 2017-12-22 ENCOUNTER — Ambulatory Visit (INDEPENDENT_AMBULATORY_CARE_PROVIDER_SITE_OTHER): Payer: Medicare HMO | Admitting: Orthopaedic Surgery

## 2017-12-22 ENCOUNTER — Ambulatory Visit (INDEPENDENT_AMBULATORY_CARE_PROVIDER_SITE_OTHER): Payer: Medicare HMO

## 2017-12-22 ENCOUNTER — Encounter (INDEPENDENT_AMBULATORY_CARE_PROVIDER_SITE_OTHER): Payer: Self-pay | Admitting: Orthopaedic Surgery

## 2017-12-22 ENCOUNTER — Other Ambulatory Visit (INDEPENDENT_AMBULATORY_CARE_PROVIDER_SITE_OTHER): Payer: Self-pay | Admitting: Radiology

## 2017-12-22 VITALS — BP 148/74 | HR 67 | Ht 62.0 in | Wt 150.0 lb

## 2017-12-22 DIAGNOSIS — G8929 Other chronic pain: Secondary | ICD-10-CM | POA: Diagnosis not present

## 2017-12-22 DIAGNOSIS — M1612 Unilateral primary osteoarthritis, left hip: Secondary | ICD-10-CM | POA: Diagnosis not present

## 2017-12-22 DIAGNOSIS — M5442 Lumbago with sciatica, left side: Secondary | ICD-10-CM | POA: Diagnosis not present

## 2017-12-22 DIAGNOSIS — M4156 Other secondary scoliosis, lumbar region: Secondary | ICD-10-CM

## 2017-12-22 DIAGNOSIS — M11262 Other chondrocalcinosis, left knee: Secondary | ICD-10-CM

## 2017-12-22 DIAGNOSIS — M4186 Other forms of scoliosis, lumbar region: Secondary | ICD-10-CM
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
## 2017-12-23 ENCOUNTER — Ambulatory Visit: Payer: Medicare HMO | Admitting: Podiatry

## 2017-12-23 DIAGNOSIS — M79676 Pain in unspecified toe(s): Secondary | ICD-10-CM

## 2017-12-23 DIAGNOSIS — B351 Tinea unguium: Secondary | ICD-10-CM | POA: Diagnosis not present

## 2017-12-24 ENCOUNTER — Telehealth: Payer: Self-pay | Admitting: Cardiovascular Disease

## 2017-12-24 NOTE — Telephone Encounter (Signed)
Walk In pt Form-Sealed Envelope dropped off placed in [place] doc box.

## 2017-12-25 ENCOUNTER — Telehealth: Payer: Self-pay

## 2017-12-25 ENCOUNTER — Telehealth (HOSPITAL_COMMUNITY): Payer: Self-pay | Admitting: Geriatric Medicine

## 2017-12-25 NOTE — Telephone Encounter (Signed)
Patient dropped off a letter that stated-   " I thought Dr. Johnsie Cancel might want to review my records with Dr. Joni Fears. On Thurs. December 10, 2017, I saw Dr. Durward Fortes who drained fluid from my left knee and gave me a Cortizone shot.  On a follow-up visit on Tues. 7-23, because of continued pain/ache in my left leg and lower back, X-rays were taken. Slight edema in my right leg was also mentioned."  Will forward message to Dr. Johnsie Cancel.

## 2017-12-25 NOTE — Telephone Encounter (Signed)
12/25/17  I called and spoke to patient and she was wanting to have her therapy in [place] [address]. So I changed the referral to say that

## 2017-12-26 NOTE — Progress Notes (Signed)
   SUBJECTIVE Patient presents to office today complaining of elongated, thickened nails that cause pain while ambulating in shoes. She is unable to trim her own nails. Patient is here for further evaluation and treatment.  Past Medical History:  Diagnosis Date  . Arthritis   . Asthma   . GERD (gastroesophageal reflux disease)   . High cholesterol   . Hypothyroidism   . LBBB (left bundle branch block)   . Ventricular ectopy     OBJECTIVE General Patient is awake, alert, and oriented x 3 and in no acute distress. Derm Skin is dry and supple bilateral. Negative open lesions or macerations. Remaining integument unremarkable. Nails are tender, long, thickened and dystrophic with subungual debris, consistent with onychomycosis, 1-5 bilateral. No signs of infection noted. Vasc  DP and PT pedal pulses palpable bilaterally. Temperature gradient within normal limits.  Neuro Epicritic and protective threshold sensation grossly intact bilaterally.  Musculoskeletal Exam No symptomatic pedal deformities noted bilateral. Muscular strength within normal limits.  ASSESSMENT 1. Onychodystrophic nails 1-5 bilateral with hyperkeratosis of nails.  2. Onychomycosis of nail due to dermatophyte bilateral 3. Pain in foot bilateral  PLAN OF CARE 1. Patient evaluated today.  2. Instructed to maintain good pedal hygiene and foot care.  3. Mechanical debridement of nails 1-5 bilaterally performed using a nail nipper. Filed with dremel without incident.  4. Return to clinic in 3 mos.    Edrick Kins, DPM [facility]  Dr. Edrick Kins, [address]                                        [place], [place] [address]                Office [phone]  Fax [phone]

## 2017-12-28 NOTE — Progress Notes (Signed)
Patient ID: Samantha [name], female   DOB: 09/10/1929, 84 y.o.   MRN: [id number] Cardiology Office Note   Date:  12/30/2017   ID:  Samantha [name], DOB 24-Oct-1929, MRN [id number]  PCP:  Lajean Manes, MD  Cardiologist: Katz/ Jenkins Rouge, MD   No chief complaint on file.    History of Present Illness:  84 y.o. history of PAF, HTN Hypothyroidism, Atypical chest pain and HLD   Samantha [name] is a 84 y.o. female who presents for ongoing assessment of recurrent chest pain, hyperlipidemia, GERD, hypothyroidism.   This patients CHA2DS2-VASc Score and unadjusted Ischemic Stroke Rate (% per year) is equal to 4.8 % stroke rate/year from a score of 4  Above score calculated as 1 point each if present [CHF, HTN, DM, Vascular=MI/PAD/Aortic Plaque, Age if 65-74, or Female] Above score calculated as 2 points each if present [Age > 75, or Stroke/TIA/TE]  Echo 09/03/15 reviewed  showed EF 35-40%  But improved to 50-55% TTE done June 2018  [facility] attempted 11/27/15  X 4 would not maintain NSR  Started on amiodarone with conversion However PFTls showed low DLCO and LFTls up with elevated TSH so amiodarone stopped 07/10/16  Recent left  knee effusion drained and Rx with cortisone Dr Durward Fortes  Still painful has not started rehab Has bad lower back and varicose veins as well  Lab Results  Component Value Date   TSH 17.390 (H) 07/14/2016   Lab Results  Component Value Date   ALT 66 (H) 07/14/2016   AST 45 (H) 07/14/2016   ALKPHOS 73 07/14/2016   BILITOT 0.4 07/14/2016     Past Medical History:  Diagnosis Date  . Arthritis   . Asthma   . GERD (gastroesophageal reflux disease)   . High cholesterol   . Hypothyroidism   . LBBB (left bundle branch block)   . Ventricular ectopy     Past Surgical History:  Procedure Laterality Date  . CARDIOVERSION N/A 11/27/2015   Procedure: CARDIOVERSION;  Surgeon: Josue Hector, MD;  Location: [facility] ENDOSCOPY;  Service: Cardiovascular;  Laterality: N/A;  . KNEE  ARTHROSCOPY       Current Outpatient Medications  Medication Sig Dispense Refill  . apixaban (ELIQUIS) 5 MG TABS tablet Take 1 tablet (5 mg total) by mouth 2 (two) times daily. 180 tablet 3  . azelastine (ASTELIN) 0.1 % nasal spray Place 1-2 sprays into both nostrils 2 (two) times daily.     . Calcium-Magnesium-Zinc (CALCIUM-MAGNESUIUM-ZINC PO) Take 1 tablet by mouth 3 (three) times daily.     . Cholecalciferol (VITAMIN D PO) Take 1 tablet by mouth daily.     Marland Kitchen denosumab (PROLIA) 60 MG/ML SOLN injection Inject 60 mg into the skin every 6 (six) months. Administer in upper arm, thigh, or abdomen    . furosemide (LASIX) 40 MG tablet Take 1 tablet (40 mg total) by mouth daily. 90 tablet 3  . levothyroxine (SYNTHROID, LEVOTHROID) 112 MCG tablet Take 112 mcg by mouth daily.    Marland Kitchen loratadine (CLARITIN) 10 MG tablet Take 10 mg by mouth daily.    Marland Kitchen lovastatin (MEVACOR) 20 MG tablet Take 20 mg by mouth daily at 6 PM.     . metoprolol succinate (TOPROL-XL) 50 MG 24 hr tablet Take 1 tablet (50 mg total) by mouth daily. 90 tablet 3  . mometasone-formoterol (DULERA) 100-5 MCG/ACT AERO Inhale 2 puffs into the lungs 2 (two) times daily.    . montelukast (SINGULAIR) 10 MG tablet Take 10  mg by mouth at bedtime.     . Omega-3 Fatty Acids (FISH OIL PO) Take 1 capsule by mouth daily.     Marland Kitchen omeprazole (PRILOSEC) 40 MG capsule Take 40 mg by mouth 2 (two) times daily.     . Polyethylene Glycol 3350 (MIRALAX PO) Take by mouth.    . potassium chloride (K-DUR) 10 MEQ tablet Take 1 tablet (10 mEq total) by mouth daily. 90 tablet 3  . PROAIR HFA 108 (90 Base) MCG/ACT inhaler Inhale 1-2 puffs into the lungs daily as needed. For wheezing & SOB    . sodium chloride (OCEAN) 0.65 % SOLN nasal spray Place 1 spray into both nostrils 2 (two) times daily.    . traMADol (ULTRAM) 50 MG tablet Take 1 tab by mouth twice a day as needed 30 tablet 0   No current facility-administered medications for this visit.     Allergies:    Erythromycin; Benzalkonium chloride; Neosporin [neomycin-bacitracin zn-polymyx]; and Penicillins    Social History:  The patient  reports that she is a non-smoker but has been exposed to tobacco smoke. She has never used smokeless tobacco. She reports that she does not drink alcohol or use drugs.   Family History:  The patient's family history includes Congestive Heart Failure in her brother and sister; Heart attack in her mother; Throat cancer in her father.    ROS: All other systems are reviewed and negative. Unless otherwise mentioned in H&P    PHYSICAL EXAM: BP 128/72   Pulse 73   Ht 5\' 2"  (1.575 m)   Wt 149 lb 4 oz (67.7 kg)   SpO2 97%   BMI 27.30 kg/m  Affect anxious  Healthy:  appears stated age 17: normal Neck supple with no adenopathy JVP normal no bruits no thyromegaly Lungs clear with no wheezing and good diaphragmatic motion Heart:  S1/S2 no murmur, no rub, gallop or click PMI normal Abdomen: benighn, BS positve, no tenderness, no AAA no bruit.  No HSM or HJR Distal pulses intact with no bruits No edema Neuro non-focal Skin warm and dry Left knee swelling recent drainage  Bilateral varicose veins      Recent Labs: No results found for requested labs within last 8760 hours.    Lipid Panel    Component Value Date/Time   CHOL 173 05/23/2014 0437   TRIG 98 05/23/2014 0437   HDL 49 05/23/2014 0437   CHOLHDL 3.5 05/23/2014 0437   VLDL 20 05/23/2014 0437   LDLCALC 104 (H) 05/23/2014 0437      Wt Readings from Last 3 Encounters:  12/30/17 149 lb 4 oz (67.7 kg)  12/22/17 150 lb (68 kg)  12/10/17 150 lb (68 kg)    ECG:   12/30/17 SR rate 69 LBBB/LAD   ASSESSMENT AND PLAN:  1. Hypertension: Well controlled.  Continue current medications and low sodium Dash type diet.    2. Afib: converted with amiodarone but side effects with elevated LFTls , TSH and decreased DLCO so stopped 3. CHF: better EF 50-55% by TTE 11/12/16  4. Chest Pain:  Normal myovue  06/2014  EF 62%   5. Thyroid:  Synthroid increased f/u labs with Dr Felipa Eth last TSH in Epic 17 07/14/16 6. LFT;s likely elevated from amiodarone improved 7. Ortho:  inflammation left knee discussed wearing elasticized sleeve and compression stockings that are less tight for legs    Current medicines are reviewed at length with the patient today.    F/U with me in a  year    Signed, Jenkins Rouge, MD  12/30/2017 2:35 PM    [place]  [address] [place], [place] [address] Phone: [phone]; Fax: [phone]

## 2017-12-30 ENCOUNTER — Ambulatory Visit: Payer: Medicare HMO | Admitting: Cardiovascular Disease

## 2017-12-30 ENCOUNTER — Encounter: Payer: Self-pay | Admitting: Cardiovascular Disease

## 2017-12-30 VITALS — BP 128/72 | HR 73 | Ht 62.0 in | Wt 149.2 lb

## 2017-12-30 DIAGNOSIS — I480 Paroxysmal atrial fibrillation: Secondary | ICD-10-CM | POA: Diagnosis not present

## 2017-12-30 DIAGNOSIS — I10 Essential (primary) hypertension: Secondary | ICD-10-CM

## 2017-12-30 DIAGNOSIS — Z79899 Other long term (current) drug therapy: Secondary | ICD-10-CM | POA: Diagnosis not present

## 2017-12-30 MED ORDER — APIXABAN 5 MG PO TABS: 5.0000 mg | ORAL_TABLET | Freq: Two times a day (BID) | ORAL | 3 refills | Status: DC

## 2017-12-30 NOTE — Patient Instructions (Addendum)
Medication Instructions:  Your physician recommends that you continue on your current medications as directed. Please refer to the Current Medication list given to you today.  Labwork: Your physician recommends that you have lab work today. BMET and CBC   Testing/Procedures: NONE  Follow-Up: Your physician wants you to follow-up in: 12 months with Dr. Johnsie Cancel. You will receive a reminder letter in the mail two months in advance. If you don't receive a letter, please call our office to schedule the follow-up appointment.   If you need a refill on your cardiac medications before your next appointment, please call your pharmacy.

## 2017-12-31 ENCOUNTER — Other Ambulatory Visit: Payer: Self-pay

## 2017-12-31 LAB — BASIC METABOLIC PANEL
BUN/Creatinine Ratio: 20 (ref 12–28)
BUN: 14 mg/dL (ref 8–27)
CALCIUM: 9.5 mg/dL (ref 8.7–10.3)
CO2: 25 mmol/L (ref 20–29)
Chloride: 97 mmol/L (ref 96–106)
Creatinine, Ser: 0.71 mg/dL (ref 0.57–1.00)
GFR calc Af Amer: 89 mL/min/{1.73_m2} (ref >59)
GFR, EST NON AFRICAN AMERICAN: 77 mL/min/{1.73_m2} (ref >59)
Glucose: 73 mg/dL (ref 65–99)
POTASSIUM: 3.7 mmol/L (ref 3.5–5.2)
Sodium: 135 mmol/L (ref 134–144)

## 2017-12-31 LAB — CBC WITH DIFFERENTIAL/PLATELET
BASOS ABS: 0 10*3/uL (ref 0.0–0.2)
Basos: 0 %
EOS (ABSOLUTE): 0.2 10*3/uL (ref 0.0–0.4)
Eos: 2 %
HEMOGLOBIN: 11.8 g/dL (ref 11.1–15.9)
Hematocrit: 36.5 % (ref 34.0–46.6)
IMMATURE GRANS (ABS): 0 10*3/uL (ref 0.0–0.1)
Immature Granulocytes: 0 %
LYMPHS: 26 %
Lymphocytes Absolute: 3.1 10*3/uL (ref 0.7–3.1)
MCH: 28.7 pg (ref 26.6–33.0)
MCHC: 32.3 g/dL (ref 31.5–35.7)
MCV: 89 fL (ref 79–97)
MONOCYTES: 13 %
Monocytes Absolute: 1.5 10*3/uL — ABNORMAL HIGH (ref 0.1–0.9)
NEUTROS PCT: 59 %
Neutrophils Absolute: 7 10*3/uL (ref 1.4–7.0)
Platelets: 248 10*3/uL (ref 150–450)
RBC: 4.11 x10E6/uL (ref 3.77–5.28)
RDW: 14.9 % (ref 12.3–15.4)
WBC: 11.9 10*3/uL — AB (ref 3.4–10.8)

## 2017-12-31 MED ORDER — APIXABAN 5 MG PO TABS: 5.0000 mg | ORAL_TABLET | Freq: Two times a day (BID) | ORAL | 3 refills | Status: DC

## 2017-12-31 NOTE — Progress Notes (Signed)
Patient stated her Eliquis went to the wrong pharmacy. Patient stated it needs to go to [facility] Delivery pharmacy. Sent to [facility].

## 2018-01-07 DIAGNOSIS — I480 Paroxysmal atrial fibrillation: Secondary | ICD-10-CM | POA: Diagnosis not present

## 2018-01-07 DIAGNOSIS — E7800 Pure hypercholesterolemia, unspecified: Secondary | ICD-10-CM | POA: Diagnosis not present

## 2018-01-07 DIAGNOSIS — I509 Heart failure, unspecified: Secondary | ICD-10-CM | POA: Diagnosis not present

## 2018-01-07 DIAGNOSIS — M810 Age-related osteoporosis without current pathological fracture: Secondary | ICD-10-CM | POA: Diagnosis not present

## 2018-01-07 DIAGNOSIS — E039 Hypothyroidism, unspecified: Secondary | ICD-10-CM | POA: Diagnosis not present

## 2018-01-12 DIAGNOSIS — I10 Essential (primary) hypertension: Secondary | ICD-10-CM | POA: Diagnosis not present

## 2018-01-12 DIAGNOSIS — E785 Hyperlipidemia, unspecified: Secondary | ICD-10-CM | POA: Diagnosis not present

## 2018-01-12 DIAGNOSIS — Z7901 Long term (current) use of anticoagulants: Secondary | ICD-10-CM | POA: Diagnosis not present

## 2018-01-12 DIAGNOSIS — I4891 Unspecified atrial fibrillation: Secondary | ICD-10-CM | POA: Diagnosis not present

## 2018-01-12 DIAGNOSIS — E039 Hypothyroidism, unspecified: Secondary | ICD-10-CM | POA: Diagnosis not present

## 2018-01-12 DIAGNOSIS — K219 Gastro-esophageal reflux disease without esophagitis: Secondary | ICD-10-CM | POA: Diagnosis not present

## 2018-01-12 DIAGNOSIS — J45909 Unspecified asthma, uncomplicated: Secondary | ICD-10-CM | POA: Diagnosis not present

## 2018-01-12 DIAGNOSIS — Z809 Family history of malignant neoplasm, unspecified: Secondary | ICD-10-CM | POA: Diagnosis not present

## 2018-01-12 DIAGNOSIS — R609 Edema, unspecified: Secondary | ICD-10-CM | POA: Diagnosis not present

## 2018-01-12 DIAGNOSIS — R32 Unspecified urinary incontinence: Secondary | ICD-10-CM | POA: Diagnosis not present

## 2018-01-14 DIAGNOSIS — J309 Allergic rhinitis, unspecified: Secondary | ICD-10-CM | POA: Diagnosis not present

## 2018-01-14 DIAGNOSIS — R110 Nausea: Secondary | ICD-10-CM | POA: Diagnosis not present

## 2018-01-14 DIAGNOSIS — L609 Nail disorder, unspecified: Secondary | ICD-10-CM | POA: Diagnosis not present

## 2018-01-14 DIAGNOSIS — J029 Acute pharyngitis, unspecified: Secondary | ICD-10-CM | POA: Diagnosis not present

## 2018-01-14 DIAGNOSIS — I480 Paroxysmal atrial fibrillation: Secondary | ICD-10-CM | POA: Diagnosis not present

## 2018-01-18 ENCOUNTER — Ambulatory Visit: Payer: Medicare HMO | Attending: Orthopaedic Surgery

## 2018-01-18 ENCOUNTER — Other Ambulatory Visit: Payer: Self-pay

## 2018-01-18 DIAGNOSIS — R293 Abnormal posture: Secondary | ICD-10-CM | POA: Diagnosis not present

## 2018-01-18 DIAGNOSIS — M62830 Muscle spasm of back: Secondary | ICD-10-CM | POA: Diagnosis not present

## 2018-01-18 DIAGNOSIS — M5442 Lumbago with sciatica, left side: Secondary | ICD-10-CM | POA: Diagnosis not present

## 2018-01-18 DIAGNOSIS — G8929 Other chronic pain: Secondary | ICD-10-CM | POA: Insufficient documentation

## 2018-01-18 NOTE — Therapy (Signed)
[place], [place], [address] Phone: [phone]   Fax:  [phone]  Physical Therapy Evaluation  Patient Details  Name: Samantha [name] MRN: [id number] Date of Birth: 06/04/1929 Referring Provider: Joni Fears, MD   Encounter Date: 01/18/2018  PT End of Session - 01/18/18 1353    Visit Number  1    Number of Visits  10    Date for PT Re-Evaluation  02/19/18    Authorization Type  [employer] MCR    PT Start Time  0142    PT Stop Time  0215    PT Time Calculation (min)  33 min    Activity Tolerance  Patient tolerated treatment well    Behavior During Therapy  [facility] for tasks assessed/performed       Past Medical History:  Diagnosis Date  . Arthritis   . Asthma   . GERD (gastroesophageal reflux disease)   . High cholesterol   . Hypothyroidism   . LBBB (left bundle branch block)   . Ventricular ectopy     Past Surgical History:  Procedure Laterality Date  . CARDIOVERSION N/A 11/27/2015   Procedure: CARDIOVERSION;  Surgeon: Josue Hector, MD;  Location: [facility] ENDOSCOPY;  Service: Cardiovascular;  Laterality: N/A;  . KNEE ARTHROSCOPY      There were no vitals filed for this visit.   Subjective Assessment - 01/18/18 1343    Subjective  She report pain from buttock to ankle.  PAin started  in June.  No injury reported.   Years ago some LBP.  Saw chiropractor pain releived.     Pertinent History  OA Lt knee    Limitations  Walking   stairs   How long can you sit comfortably?  As needed    How long can you stand comfortably?  5 min    How long can you walk comfortably?  As needed    Diagnostic tests  Xray: OA knee /hip and back    Patient Stated Goals  Get rid of pain    Currently in Pain?  Yes    Pain Score  7     Pain Location  Back    Pain Orientation  Left    Pain Descriptors / Indicators  Aching    Pain Type  Chronic pain    Pain Radiating Towards  Always in same place deep in leg    Pain  Onset  More than a month ago    Pain Frequency  Intermittent   lyin on coach with leg bent   Aggravating Factors   standing and walking.     Pain Relieving Factors  lye and bend Lt leg         [facility] PT Assessment - 01/18/18 0001      Assessment   Medical Diagnosis  back pain with Lt sciatica    Referring Provider  Joni Fears, MD    Onset Date/Surgical Date  --   june 2019   Next MD Visit  As needed    Prior Therapy  No      Precautions   Precautions  None      Restrictions   Weight Bearing Restrictions  No      Balance Screen   Has the patient fallen in the past 6 months  No      Prior Function   Level of Independence  Independent    Leisure  goes out less now  Cognition   Overall Cognitive Status  Within Functional Limits for tasks assessed      Posture/Postural Control   Posture Comments  flexed knees bilateral, incr lordosis       ROM / Strength   AROM / PROM / Strength  PROM;AROM;Strength      AROM   AROM Assessment Site  Lumbar    Lumbar Flexion  70   bend knees to return   Lumbar Extension  15    Lumbar - Right Side Bend  10    Lumbar - Left Side Bend  12      PROM   Overall PROM Comments  leg pull to LT leg eased pain    Right Hip External Rotation   50    Right Hip Internal Rotation   15    Left Hip Flexion  110    Left Hip External Rotation   45    Left Hip Internal Rotation   15    Left Hip ABduction  20      Right Hip   Right Hip Flexion  110    Right Hip ABduction  20      Palpation   Palpation comment  tender Lt L% area soft tissue and glut minimus/ medius area area                   Objective measurements completed on examination: See above findings.                             Patient will benefit from skilled therapeutic intervention in order to improve the following deficits and impairments:     Visit Diagnosis: Chronic bilateral low back pain with left-sided sciatica     Problem  List Patient Active Problem List   Diagnosis Date Noted  . Abnormal CT scan, chest 08/08/2016  . Near syncope 01/06/2015  . Left leg weakness 01/06/2015  . High cholesterol   . Hypothyroidism   . Ventricular ectopy   . Chest pain 05/22/2014  . Left bundle branch block 05/22/2014  . PVC's (premature ventricular contractions) 05/22/2014  . Intrinsic asthma 02/14/2010  . HOARSENESS 02/14/2010  . CHEST PAIN, ATYPICAL 02/14/2010  . ALLERGIC RHINITIS 02/13/2010  . Hermina Staggers 02/13/2010    Darrel Hoover  PT 01/18/2018, 4:35 PM  [facility] [address] [place], [place], [address] Phone: [phone]   Fax:  [phone]  Name: Samantha [name] MRN: [id number] Date of Birth: 10-08-1929

## 2018-01-20 ENCOUNTER — Ambulatory Visit: Payer: Medicare HMO | Admitting: Physical Therapy

## 2018-01-20 ENCOUNTER — Encounter: Payer: Self-pay | Admitting: Physical Therapy

## 2018-01-20 DIAGNOSIS — G8929 Other chronic pain: Secondary | ICD-10-CM | POA: Diagnosis not present

## 2018-01-20 DIAGNOSIS — M5442 Lumbago with sciatica, left side: Principal | ICD-10-CM

## 2018-01-20 DIAGNOSIS — M62830 Muscle spasm of back: Secondary | ICD-10-CM | POA: Diagnosis not present

## 2018-01-20 DIAGNOSIS — R293 Abnormal posture: Secondary | ICD-10-CM

## 2018-01-20 NOTE — Therapy (Signed)
[place], [place], [address] Phone: [phone]   Fax:  [phone]  Physical Therapy Treatment  Patient Details  Name: Samantha [name] MRN: [id number] Date of Birth: 12-10-1929 Referring Provider: Joni Fears, MD   Encounter Date: 01/20/2018  PT End of Session - 01/20/18 1419    Visit Number  2    Number of Visits  10    Date for PT Re-Evaluation  02/19/18    Authorization Type  [employer]na MCR    PT Start Time  0100    PT Stop Time  0145    PT Time Calculation (min)  45 min       Past Medical History:  Diagnosis Date  . Arthritis   . Asthma   . GERD (gastroesophageal reflux disease)   . High cholesterol   . Hypothyroidism   . LBBB (left bundle branch block)   . Ventricular ectopy     Past Surgical History:  Procedure Laterality Date  . CARDIOVERSION N/A 11/27/2015   Procedure: CARDIOVERSION;  Surgeon: Josue Hector, MD;  Location: [facility] ENDOSCOPY;  Service: Cardiovascular;  Laterality: N/A;  . KNEE ARTHROSCOPY      There were no vitals filed for this visit.  Subjective Assessment - 01/20/18 1308    Subjective  Pain about the same. Did not get any exercises due to time restraint on last visit.     Currently in Pain?  Yes    Pain Score  7     Pain Location  Back    Pain Orientation  Left    Pain Descriptors / Indicators  Aching    Pain Radiating Towards  down to left ankle                        [facility] Adult PT Treatment/Exercise - 01/20/18 0001      Lumbar Exercises: Stretches   Active Hamstring Stretch  20 seconds;2 reps    Single Knee to Chest Stretch  2 reps;30 seconds    Piriformis Stretch  2 reps;30 seconds    Figure 4 Stretch  2 reps;30 seconds      Lumbar Exercises: Supine   Ab Set  5 reps    Pelvic Tilt  10 reps    Pelvic Tilt Limitations  using abdominals and gluteals.       Manual Therapy   Manual Therapy  Joint mobilization    Joint Mobilization   A/Pgrade III left hip, LAD 20 sec x 4- pt c/ o left knee pain                         PT Education - 01/20/18 1415    Education Details  HEP    Person(s) Educated  Patient    Methods  Explanation;Handout    Comprehension  Verbalized understanding       PT Short Term Goals - 01/18/18 1638      PT SHORT TERM GOAL #1   Title  She will be independnet with iniital HEP    Time  2    Period  Weeks    Status  New        PT Long Term Goals - 01/18/18 1638      PT LONG TERM GOAL #1   Title  She will be independent with all HEP issued    Time  5    Period  Weeks  Status  New      PT LONG TERM GOAL #2   Title  She will report able to walk  for normal home tasks with no increased pain    Time  5    Period  Weeks    Status  New      PT LONG TERM GOAL #3   Title  She will report back and hip pain as intermittant when on feet.    Time  5    Period  Weeks    Status  New      PT LONG TERM GOAL #4   Title  FOTO score will decr to  > 30% limited limited to indicate functional improvement    Time  5    Period  Weeks    Status  New            Plan - 01/20/18 1416    Clinical Impression Statement  Began left hip mobs and hip/pelvic ROM  HEP. Pt felt better at end of treatment. Increased knee pain with long axis distraction of LLE.     PT Next Visit Plan  HEP , manual, modalities.  leg pull LT , review and assess HEP    PT Home Exercise Plan  hamstring stretch, modified figure 4, piriformis stretch, pelvic tilt    Consulted and Agree with Plan of Care  Patient       Patient will benefit from skilled therapeutic intervention in order to improve the following deficits and impairments:  Pain, Difficulty walking, Increased muscle spasms, Postural dysfunction, Decreased range of motion, Decreased activity tolerance  Visit Diagnosis: Chronic bilateral low back pain with left-sided sciatica  Muscle spasm of back  Abnormal posture     Problem List Patient  Active Problem List   Diagnosis Date Noted  . Abnormal CT scan, chest 08/08/2016  . Near syncope 01/06/2015  . Left leg weakness 01/06/2015  . High cholesterol   . Hypothyroidism   . Ventricular ectopy   . Chest pain 05/22/2014  . Left bundle branch block 05/22/2014  . PVC's (premature ventricular contractions) 05/22/2014  . Intrinsic asthma 02/14/2010  . HOARSENESS 02/14/2010  . CHEST PAIN, ATYPICAL 02/14/2010  . ALLERGIC RHINITIS 02/13/2010  . Marcy Salvo D 02/13/2010    Samantha [name], PTA 01/20/2018, 2:20 PM  [facility] [address] [place], [place], [address] Phone: [phone]   Fax:  [phone]  Name: Samantha [name] MRN: [id number] Date of Birth: 11-17-1929

## 2018-01-29 ENCOUNTER — Encounter

## 2018-02-02 ENCOUNTER — Ambulatory Visit: Payer: Medicare HMO | Attending: Orthopaedic Surgery

## 2018-02-02 DIAGNOSIS — M62830 Muscle spasm of back: Secondary | ICD-10-CM | POA: Insufficient documentation

## 2018-02-02 DIAGNOSIS — G8929 Other chronic pain: Secondary | ICD-10-CM | POA: Diagnosis not present

## 2018-02-02 DIAGNOSIS — M5442 Lumbago with sciatica, left side: Secondary | ICD-10-CM | POA: Diagnosis not present

## 2018-02-02 DIAGNOSIS — R293 Abnormal posture: Secondary | ICD-10-CM

## 2018-02-02 NOTE — Therapy (Signed)
Samantha [name], [place], [address] Phone: [phone]   Fax:  [phone]  Physical Therapy Treatment  Patient Details  Name: Samantha [name] MRN: [id number] Date of Birth: June 20, 1929 Referring Provider: Joni Fears, MD   Encounter Date: 02/02/2018  PT End of Session - 02/02/18 1443    Visit Number  3    Number of Visits  10    Date for PT Re-Evaluation  02/19/18    Authorization Type  [employer]na MCR    PT Start Time  0245    PT Stop Time  0335    PT Time Calculation (min)  50 min    Activity Tolerance  Patient tolerated treatment well    Behavior During Therapy  [facility] for tasks assessed/performed       Past Medical History:  Diagnosis Date  . Arthritis   . Asthma   . GERD (gastroesophageal reflux disease)   . High cholesterol   . Hypothyroidism   . LBBB (left bundle branch block)   . Ventricular ectopy     Past Surgical History:  Procedure Laterality Date  . CARDIOVERSION N/A 11/27/2015   Procedure: CARDIOVERSION;  Surgeon: Josue Hector, MD;  Location: [facility] ENDOSCOPY;  Service: Cardiovascular;  Laterality: N/A;  . KNEE ARTHROSCOPY      There were no vitals filed for this visit.  Subjective Assessment - 02/02/18 1449    Subjective  No  benefit from exercies . As soon as stood  pain came back . LT knee swollen again.  Lt lower back pain to foot     Pain Score  5     Pain Location  Back    Pain Orientation  Left    Pain Descriptors / Indicators  Aching    Pain Type  Chronic pain    Pain Onset  More than a month ago    Pain Frequency  Intermittent    Aggravating Factors   stand and walk    Pain Relieving Factors  lye and bend knee                        [facility] Adult PT Treatment/Exercise - 02/02/18 0001      Lumbar Exercises: Stretches   Active Hamstring Stretch  Right;Left;2 reps;30 seconds    Single Knee to Chest Stretch  2 reps;30 seconds    Piriformis Stretch  2 reps;30  seconds    Figure 4 Stretch  2 reps;30 seconds      Lumbar Exercises: Standing   Other Standing Lumbar Exercises  use of tennis ball for STW    Other Standing Lumbar Exercises  lateral shift in standing and flexed. with deep breathing.       Lumbar Exercises: Supine   Pelvic Tilt  15 reps    Pelvic Tilt Limitations  worked on abdominal recruitment              PT Education - 02/02/18 1541    Education Details  HEP    Person(s) Educated  Patient    Methods  Explanation;Demonstration;Tactile cues;Handout;Verbal cues    Comprehension  Returned demonstration;Verbalized understanding       PT Short Term Goals - 01/18/18 1638      PT SHORT TERM GOAL #1   Title  She will be independnet with iniital HEP    Time  2    Period  Weeks    Status  New  PT Long Term Goals - 01/18/18 1638      PT LONG TERM GOAL #1   Title  She will be independent with all HEP issued    Time  5    Period  Weeks    Status  New      PT LONG TERM GOAL #2   Title  She will report able to walk  for normal home tasks with no increased pain    Time  5    Period  Weeks    Status  New      PT LONG TERM GOAL #3   Title  She will report back and hip pain as intermittant when on feet.    Time  5    Period  Weeks    Status  New      PT LONG TERM GOAL #4   Title  FOTO score will decr to  > 30% limited limited to indicate functional improvement    Time  5    Period  Weeks    Status  New            Plan - 02/02/18 1443    Clinical Impression Statement  She reported feeling better post with slight increased knee soreness and less leg ache.  Cautioned to stop any exercise that flares knee of LT leg.      PT Treatment/Interventions  Passive range of motion;Manual techniques;Patient/family education;Therapeutic exercise;Dry needling;Ultrasound;Traction;Moist Heat    PT Next Visit Plan  HEP , manual, modalities.  leg pull LT , review and assess HEP    PT Home Exercise Plan  hamstring  stretch, modified figure 4, piriformis stretch, pelvic tilt, tennis ball STW,  lateral shift hip to Lt at wall and with trunk flexion at counter.     Consulted and Agree with Plan of Care  Patient       Patient will benefit from skilled therapeutic intervention in order to improve the following deficits and impairments:  Pain, Difficulty walking, Increased muscle spasms, Postural dysfunction, Decreased range of motion, Decreased activity tolerance  Visit Diagnosis: Chronic bilateral low back pain with left-sided sciatica  Muscle spasm of back  Abnormal posture     Problem List Patient Active Problem List   Diagnosis Date Noted  . Abnormal CT scan, chest 08/08/2016  . Near syncope 01/06/2015  . Left leg weakness 01/06/2015  . High cholesterol   . Hypothyroidism   . Ventricular ectopy   . Chest pain 05/22/2014  . Left bundle branch block 05/22/2014  . PVC's (premature ventricular contractions) 05/22/2014  . Intrinsic asthma 02/14/2010  . HOARSENESS 02/14/2010  . CHEST PAIN, ATYPICAL 02/14/2010  . ALLERGIC RHINITIS 02/13/2010  . Hermina Staggers 02/13/2010    Samantha [name]  PT 02/02/2018, 3:46 PM  [place] [facility] [address] [place], [place], [address] Phone: [phone]   Fax:  [phone]  Name: Samantha [name] MRN: [id number] Date of Birth: Jun 05, 1929

## 2018-02-02 NOTE — Patient Instructions (Signed)
Issued lateral shift hip to LT to wall 3-10 reps 5-30sec  Also adding deep breathing.  Also forward lead to counter with hip shift LT  With reach and deep breathing.  Also issued tennis ball for trigger point massage  1-2x/day

## 2018-02-03 DIAGNOSIS — Z23 Encounter for immunization: Secondary | ICD-10-CM | POA: Diagnosis not present

## 2018-02-03 DIAGNOSIS — I480 Paroxysmal atrial fibrillation: Secondary | ICD-10-CM | POA: Diagnosis not present

## 2018-02-03 DIAGNOSIS — Z79899 Other long term (current) drug therapy: Secondary | ICD-10-CM | POA: Diagnosis not present

## 2018-02-03 DIAGNOSIS — M25562 Pain in left knee: Secondary | ICD-10-CM | POA: Diagnosis not present

## 2018-02-03 DIAGNOSIS — Z1389 Encounter for screening for other disorder: Secondary | ICD-10-CM | POA: Diagnosis not present

## 2018-02-03 DIAGNOSIS — E7800 Pure hypercholesterolemia, unspecified: Secondary | ICD-10-CM | POA: Diagnosis not present

## 2018-02-03 DIAGNOSIS — M810 Age-related osteoporosis without current pathological fracture: Secondary | ICD-10-CM | POA: Diagnosis not present

## 2018-02-03 DIAGNOSIS — Z0000 Encounter for general adult medical examination without abnormal findings: Secondary | ICD-10-CM | POA: Diagnosis not present

## 2018-02-03 DIAGNOSIS — E039 Hypothyroidism, unspecified: Secondary | ICD-10-CM | POA: Diagnosis not present

## 2018-02-03 DIAGNOSIS — I509 Heart failure, unspecified: Secondary | ICD-10-CM | POA: Diagnosis not present

## 2018-02-04 ENCOUNTER — Ambulatory Visit: Payer: Medicare HMO

## 2018-02-04 DIAGNOSIS — R293 Abnormal posture: Secondary | ICD-10-CM | POA: Diagnosis not present

## 2018-02-04 DIAGNOSIS — G8929 Other chronic pain: Secondary | ICD-10-CM | POA: Diagnosis not present

## 2018-02-04 DIAGNOSIS — M5442 Lumbago with sciatica, left side: Secondary | ICD-10-CM | POA: Diagnosis not present

## 2018-02-04 DIAGNOSIS — M62830 Muscle spasm of back: Secondary | ICD-10-CM | POA: Diagnosis not present

## 2018-02-04 NOTE — Therapy (Signed)
[place], [place], [address] Phone: [phone]   Fax:  [phone]  Physical Therapy Treatment  Patient Details  Name: Samantha [name] MRN: [id number] Date of Birth: Aug 27, 1929 Referring Provider: Joni Fears, MD   Encounter Date: 02/04/2018  PT End of Session - 02/04/18 1510    Visit Number  4    Number of Visits  10    Date for PT Re-Evaluation  02/19/18    Authorization Type  A[employer] MCR    PT Start Time  0300    PT Stop Time  0345    PT Time Calculation (min)  45 min    Activity Tolerance  Patient tolerated treatment well    Behavior During Therapy  [facility] for tasks assessed/performed       Past Medical History:  Diagnosis Date  . Arthritis   . Asthma   . GERD (gastroesophageal reflux disease)   . High cholesterol   . Hypothyroidism   . LBBB (left bundle branch block)   . Ventricular ectopy     Past Surgical History:  Procedure Laterality Date  . CARDIOVERSION N/A 11/27/2015   Procedure: CARDIOVERSION;  Surgeon: Josue Hector, MD;  Location: [facility] ENDOSCOPY;  Service: Cardiovascular;  Laterality: N/A;  . KNEE ARTHROSCOPY      There were no vitals filed for this visit.  Subjective Assessment - 02/04/18 1508    Subjective  PCP yesterday. Cont with stretching exercises. Marland Kitchen  He was not considering Sx as option.   I can put up with some pain    Pain Score  3     Pain Location  Back    Pain Orientation  Left    Pain Descriptors / Indicators  Aching    Pain Type  Chronic pain    Pain Onset  More than a month ago    Pain Frequency  Intermittent                       [facility] Adult PT Treatment/Exercise - 02/04/18 0001      Lumbar Exercises: Stretches   Active Hamstring Stretch  Right;Left;2 reps;30 seconds    Single Knee to Chest Stretch  2 reps;30 seconds    Piriformis Stretch  2 reps;30 seconds      Lumbar Exercises: Supine   Pelvic Tilt  15 reps;5 seconds      Manual  Therapy   Joint Mobilization  A/Pgrade III left hip, LAD 20 sec x 3   STW to gluteala an dLT lumbar paraspinals.       Bridge x 10,          PT Short Term Goals - 02/04/18 1541      PT SHORT TERM GOAL #1   Title  She will be independnet with iniital HEP    Status  Achieved        PT Long Term Goals - 01/18/18 1638      PT LONG TERM GOAL #1   Title  She will be independent with all HEP issued    Time  5    Period  Weeks    Status  New      PT LONG TERM GOAL #2   Title  She will report able to walk  for normal home tasks with no increased pain    Time  5    Period  Weeks    Status  New  PT LONG TERM GOAL #3   Title  She will report back and hip pain as intermittant when on feet.    Time  5    Period  Weeks    Status  New      PT LONG TERM GOAL #4   Title  FOTO score will decr to  > 30% limited limited to indicate functional improvement    Time  5    Period  Weeks    Status  New            Plan - 02/04/18 1511    Clinical Impression Statement  She is tender in all proximal thigh tissues and decr ROM  so may STW and stretch hip with PT      PT Treatment/Interventions  Passive range of motion;Manual techniques;Patient/family education;Therapeutic exercise;Dry needling;Ultrasound;Traction;Moist Heat    PT Next Visit Plan  HEP , manual, modalities.  leg pull LT , review and assess HEP,,  STW to ant and medial proximal thighs    PT Home Exercise Plan  hamstring stretch, modified figure 4, piriformis stretch, pelvic tilt, tennis ball STW,  lateral shift hip to Lt at wall and with trunk flexion at counter.     Consulted and Agree with Plan of Care  Patient       Patient will benefit from skilled therapeutic intervention in order to improve the following deficits and impairments:  Pain, Difficulty walking, Increased muscle spasms, Postural dysfunction, Decreased range of motion, Decreased activity tolerance  Visit Diagnosis: Chronic bilateral low back pain  with left-sided sciatica  Muscle spasm of back  Abnormal posture     Problem List Patient Active Problem List   Diagnosis Date Noted  . Abnormal CT scan, chest 08/08/2016  . Near syncope 01/06/2015  . Left leg weakness 01/06/2015  . High cholesterol   . Hypothyroidism   . Ventricular ectopy   . Chest pain 05/22/2014  . Left bundle branch block 05/22/2014  . PVC's (premature ventricular contractions) 05/22/2014  . Intrinsic asthma 02/14/2010  . HOARSENESS 02/14/2010  . CHEST PAIN, ATYPICAL 02/14/2010  . ALLERGIC RHINITIS 02/13/2010  . Hermina Staggers 02/13/2010    Darrel Hoover  PT 02/04/2018, 3:42 PM  [place] [facility] [address] [place], [place], [address] Phone: [phone]   Fax:  [phone]  Name: Samantha [name] MRN: [id number] Date of Birth: May 03, 1930

## 2018-02-08 ENCOUNTER — Ambulatory Visit: Payer: Medicare HMO

## 2018-02-08 DIAGNOSIS — M62830 Muscle spasm of back: Secondary | ICD-10-CM

## 2018-02-08 DIAGNOSIS — M5442 Lumbago with sciatica, left side: Secondary | ICD-10-CM | POA: Diagnosis not present

## 2018-02-08 DIAGNOSIS — G8929 Other chronic pain: Secondary | ICD-10-CM

## 2018-02-08 DIAGNOSIS — R293 Abnormal posture: Secondary | ICD-10-CM

## 2018-02-08 NOTE — Therapy (Signed)
[place], [place], [address] Phone: [phone]   Fax:  [phone]  Physical Therapy Treatment  Patient Details  Name: Samantha [name] MRN: [id number] Date of Birth: [birth year] Referring Provider: Joni Fears, MD   Encounter Date: 02/08/2018  PT End of Session - 02/08/18 1504    Visit Number  5    Number of Visits  10    Date for PT Re-Evaluation  02/19/18    Authorization Type  Aetna MCR    PT Start Time  0300    PT Stop Time  0351    PT Time Calculation (min)  51 min    Activity Tolerance  Patient tolerated treatment well    Behavior During Therapy  [facility] for tasks assessed/performed       Past Medical History:  Diagnosis Date  . Arthritis   . Asthma   . GERD (gastroesophageal reflux disease)   . High cholesterol   . Hypothyroidism   . LBBB (left bundle branch block)   . Ventricular ectopy     Past Surgical History:  Procedure Laterality Date  . CARDIOVERSION N/A 11/27/2015   Procedure: CARDIOVERSION;  Surgeon: Josue Hector, MD;  Location: [facility] ENDOSCOPY;  Service: Cardiovascular;  Laterality: N/A;  . KNEE ARTHROSCOPY      There were no vitals filed for this visit.  Subjective Assessment - 02/08/18 1505    Subjective  Ache decr. less duration . Stretching eases pain . Back still sensitive.  Get around better. Still uses cane due to RT knee instability    Pain Score  4     Pain Location  Back    Pain Orientation  Left    Pain Descriptors / Indicators  Aching    Pain Type  Chronic pain    Pain Onset  More than a month ago    Pain Frequency  Constant    Aggravating Factors   stand and walk    Pain Relieving Factors  lye and flex                       [facility] Adult PT Treatment/Exercise - 02/08/18 0001      Lumbar Exercises: Stretches   Active Hamstring Stretch  Left;1 rep;60 seconds    Single Knee to Chest Stretch  Left;2 reps;30 seconds    Piriformis Stretch  2  reps;30 seconds      Lumbar Exercises: Standing   Other Standing Lumbar Exercises  lateral shift in standing and flexed. with deep breathing.       Modalities   Modalities  Moist Heat      Moist Heat Therapy   Number Minutes Moist Heat  12 Minutes    Moist Heat Location  Lumbar Spine      Manual Therapy   Manual Therapy  Manual Traction;Soft tissue mobilization    Joint Mobilization  posterior glides RT hip followed bu stretching hip adduction , flexion , abduction Gr 11-111     Soft tissue mobilization  to anterior hip and lateral hip with trigger pt release      Bridging , PPT glute squeezes all 15-20 reps          PT Short Term Goals - 02/04/18 1541      PT SHORT TERM GOAL #1   Title  She will be independnet with iniital HEP    Status  Achieved        PT Long Term  Goals - 02/08/18 1541      PT LONG TERM GOAL #1   Title  She will be independent with all HEP issued    Status  On-going      PT LONG TERM GOAL #2   Title  She will report able to walk  for normal home tasks with no increased pain    Status  On-going      PT LONG TERM GOAL #3   Title  She will report back and hip pain as intermittant when on feet.    Status  On-going      PT LONG TERM GOAL #4   Title  FOTO score will decr to  > 30% limited limited to indicate functional improvement    Status  Unable to assess            Plan - 02/08/18 1504    Clinical Impression Statement  She reports improvent overallbut back pain unchanged form last session . LT hip may be contributing to pain  with tissue tenderness and decr ROM.,  Continue to work on LT hip along with back    PT Treatment/Interventions  Passive range of motion;Manual techniques;Patient/family education;Therapeutic exercise;Dry needling;Ultrasound;Traction;Moist Heat    PT Next Visit Plan  HEP , manual, modalities.  leg pull LT , review and assess HEP,,  STW to ant and medial proximal thighs joint mobs to Lt hip    PT Home Exercise  Plan  hamstring stretch, modified figure 4, piriformis stretch, pelvic tilt, tennis ball STW,  lateral shift hip to Lt at wall and with trunk flexion at counter.     Consulted and Agree with Plan of Care  Patient       Patient will benefit from skilled therapeutic intervention in order to improve the following deficits and impairments:  Pain, Difficulty walking, Increased muscle spasms, Postural dysfunction, Decreased range of motion, Decreased activity tolerance  Visit Diagnosis: Chronic bilateral low back pain with left-sided sciatica  Muscle spasm of back  Abnormal posture     Problem List Patient Active Problem List   Diagnosis Date Noted  . Abnormal CT scan, chest 08/08/2016  . Near syncope 01/06/2015  . Left leg weakness 01/06/2015  . High cholesterol   . Hypothyroidism   . Ventricular ectopy   . Chest pain 05/22/2014  . Left bundle branch block 05/22/2014  . PVC's (premature ventricular contractions) 05/22/2014  . Intrinsic asthma 02/14/2010  . HOARSENESS 02/14/2010  . CHEST PAIN, ATYPICAL 02/14/2010  . ALLERGIC RHINITIS 02/13/2010  . Hermina Staggers 02/13/2010    Darrel Hoover  PT 02/08/2018, 3:42 PM  [facility] [address] [place], [place], [address] Phone: [phone]   Fax:  [phone]  Name: Samantha [name] MRN: [id number] Date of Birth: 1929/06/10

## 2018-02-10 ENCOUNTER — Encounter: Payer: Self-pay | Admitting: Physical Therapy

## 2018-02-10 ENCOUNTER — Ambulatory Visit: Payer: Medicare HMO | Admitting: Physical Therapy

## 2018-02-10 DIAGNOSIS — R293 Abnormal posture: Secondary | ICD-10-CM | POA: Diagnosis not present

## 2018-02-10 DIAGNOSIS — M62830 Muscle spasm of back: Secondary | ICD-10-CM

## 2018-02-10 DIAGNOSIS — G8929 Other chronic pain: Secondary | ICD-10-CM | POA: Diagnosis not present

## 2018-02-10 DIAGNOSIS — M5442 Lumbago with sciatica, left side: Secondary | ICD-10-CM | POA: Diagnosis not present

## 2018-02-10 NOTE — Therapy (Signed)
[place], [place], [address] Phone: [phone]   Fax:  [phone]  Physical Therapy Treatment  Patient Details  Name: Samantha [name] MRN: [id number] Date of Birth: 1930/03/04 Referring Provider: Joni Fears, MD   Encounter Date: 02/10/2018  PT End of Session - 02/10/18 1313    Visit Number  6    Number of Visits  10    Date for PT Re-Evaluation  02/19/18    Authorization Type  Aetna MCR    PT Start Time  0100    PT Stop Time  0145    PT Time Calculation (min)  45 min       Past Medical History:  Diagnosis Date  . Arthritis   . Asthma   . GERD (gastroesophageal reflux disease)   . High cholesterol   . Hypothyroidism   . LBBB (left bundle branch block)   . Ventricular ectopy     Past Surgical History:  Procedure Laterality Date  . CARDIOVERSION N/A 11/27/2015   Procedure: CARDIOVERSION;  Surgeon: Josue Hector, MD;  Location: [facility] ENDOSCOPY;  Service: Cardiovascular;  Laterality: N/A;  . KNEE ARTHROSCOPY      There were no vitals filed for this visit.  Subjective Assessment - 02/10/18 1257    Currently in Pain?  Yes    Pain Score  5     Pain Location  Back    Pain Orientation  Left    Pain Descriptors / Indicators  Aching    Pain Radiating Towards  down to left ankle     Aggravating Factors   standing, walking     Pain Relieving Factors  lay                       [facility] Adult PT Treatment/Exercise - 02/10/18 0001      Exercises   Exercises  Lumbar      Lumbar Exercises: Stretches   Active Hamstring Stretch  Right;Left;2 reps;30 seconds   seated and   Active Hamstring Stretch Limitations  supine with strap - DF pumps-pt became tearful with pain on left     Single Knee to Chest Stretch  Left;2 reps;30 seconds    Lower Trunk Rotation  10 seconds;5 reps    Piriformis Stretch  2 reps;30 seconds    Figure 4 Stretch  2 reps;30 seconds      Lumbar Exercises: Seated    Other Seated Lumbar Exercises  pelvic tilts , seated weight shidting left and right.       Lumbar Exercises: Supine   Pelvic Tilt  15 reps    Pelvic Tilt Limitations  worked on abdominal recruitment     Clam  10 reps    Clam Limitations  red band and then unilatera alternating pulls     Bridge  10 reps    Bridge Limitations  with initial pelvic tilt     Other Supine Lumbar Exercises  ball squeeze with abdominal draw in             PT Education - 02/10/18 1354    Education Details  HEP    Person(s) Educated  Patient    Methods  Explanation;Handout    Comprehension  Verbalized understanding       PT Short Term Goals - 02/04/18 1541      PT SHORT TERM GOAL #1   Title  She will be independnet with iniital HEP    Status  Achieved        PT Long Term Goals - 02/08/18 1541      PT LONG TERM GOAL #1   Title  She will be independent with all HEP issued    Status  On-going      PT LONG TERM GOAL #2   Title  She will report able to walk  for normal home tasks with no increased pain    Status  On-going      PT LONG TERM GOAL #3   Title  She will report back and hip pain as intermittant when on feet.    Status  On-going      PT LONG TERM GOAL #4   Title  FOTO score will decr to  > 30% limited limited to indicate functional improvement    Status  Unable to assess            Plan - 02/10/18 1350    Clinical Impression Statement  Pt reports continued pain from left low back to left foot in weight bearing position. Difficulty with standing and walking. Continued with lumbar stabilization and updated HEP. Attempted left nerve glides on left however pt reports intense pain in left leg and she became tearful. Worked on left quad sets which she reports increased knee pain and also worked on stretching left hamstring in sitting. Pt reports a little ache behind left knee upon standing after treatment, otherwise no pain. She will be out of town next  week and will need ERO  when she retruns.     PT Next Visit Plan  HEP , manual, modalities.  leg pull LT , review and assess HEP,,  STW to ant and medial proximal thighs joint mobs to Lt hip, add some left knee exercises     PT Home Exercise Plan  hamstring stretch, modified figure 4, piriformis stretch, pelvic tilt, tennis ball STW,  lateral shift hip to Lt at wall and with trunk flexion at counter. ball squeeze and red band clam    Consulted and Agree with Plan of Care  Patient       Patient will benefit from skilled therapeutic intervention in order to improve the following deficits and impairments:  Pain, Difficulty walking, Increased muscle spasms, Postural dysfunction, Decreased range of motion, Decreased activity tolerance  Visit Diagnosis: Chronic bilateral low back pain with left-sided sciatica  Abnormal posture  Muscle spasm of back     Problem List Patient Active Problem List   Diagnosis Date Noted  . Abnormal CT scan, chest 08/08/2016  . Near syncope 01/06/2015  . Left leg weakness 01/06/2015  . High cholesterol   . Hypothyroidism   . Ventricular ectopy   . Chest pain 05/22/2014  . Left bundle branch block 05/22/2014  . PVC's (premature ventricular contractions) 05/22/2014  . Intrinsic asthma 02/14/2010  . HOARSENESS 02/14/2010  . CHEST PAIN, ATYPICAL 02/14/2010  . ALLERGIC RHINITIS 02/13/2010  . Marcy Salvo D 02/13/2010    [name], PTA 02/10/2018, 2:12 PM  [facility] [address] [place], [place], [address] Phone: [phone]   Fax:  [phone]  Name: KENNIE KARAPETIAN MRN: [id number] Date of Birth: [birth year]

## 2018-02-10 NOTE — Patient Instructions (Signed)
External Rotation: Hip - Knees Apart (Hook-Lying)    Lie with hips and knees bent, band tied just above knees. Pull knees apart. Hold for _5__ seconds. Return Repeat _20__ times. Do _2__ times a day.  Adduction: Hip - Knees Together (Hook-Lying)    Lie with hips and knees bent, towel roll between knees. Push knees together. Hold for _5__ seconds. . Repeat _10__ times. Do _2__ times a day.   Copyright  VHI. All rights reserved.

## 2018-02-15 ENCOUNTER — Ambulatory Visit: Payer: Medicare HMO

## 2018-02-17 DIAGNOSIS — L821 Other seborrheic keratosis: Secondary | ICD-10-CM | POA: Diagnosis not present

## 2018-02-17 DIAGNOSIS — D692 Other nonthrombocytopenic purpura: Secondary | ICD-10-CM | POA: Diagnosis not present

## 2018-02-22 ENCOUNTER — Ambulatory Visit: Payer: Medicare HMO

## 2018-02-22 DIAGNOSIS — R293 Abnormal posture: Secondary | ICD-10-CM

## 2018-02-22 DIAGNOSIS — M5442 Lumbago with sciatica, left side: Principal | ICD-10-CM

## 2018-02-22 DIAGNOSIS — G8929 Other chronic pain: Secondary | ICD-10-CM

## 2018-02-22 DIAGNOSIS — M62830 Muscle spasm of back: Secondary | ICD-10-CM

## 2018-02-22 NOTE — Therapy (Addendum)
[place], [place], [address] Phone: [phone]   Fax:  [phone]  Patient Details  Name: Samantha [name] MRN: [id number] Date of Birth: 1929-08-03 Referring Provider:  Lajean Manes, MD  Encounter Date: 02/22/2018       Samantha [name] is very frustrated with lack of progress .  She feels the PT interventions help but only for a short period. She feels symptoms ar e no better or worse and wants to know what is going on. She is interested in talking to Dr Durward Fortes again and possibly pursuing MRI. She agreed to be on hold for now and will contact Dr Durward Fortes for an appointment to get his advice.    Darrel Hoover  PT 02/22/2018, 2:46 PM    This is her discharge note as she did not return after her MRI.                                        Pearson Forster  PT         03/25/18 [place], [place], [address] Phone: [phone]   Fax:  [phone]

## 2018-03-08 ENCOUNTER — Other Ambulatory Visit (INDEPENDENT_AMBULATORY_CARE_PROVIDER_SITE_OTHER): Payer: Self-pay | Admitting: Radiology

## 2018-03-08 ENCOUNTER — Ambulatory Visit (INDEPENDENT_AMBULATORY_CARE_PROVIDER_SITE_OTHER): Payer: Medicare HMO | Admitting: Orthopaedic Surgery

## 2018-03-08 ENCOUNTER — Encounter (INDEPENDENT_AMBULATORY_CARE_PROVIDER_SITE_OTHER): Payer: Self-pay | Admitting: Orthopaedic Surgery

## 2018-03-08 VITALS — BP 126/91 | HR 79 | Ht 62.0 in | Wt 149.0 lb

## 2018-03-08 DIAGNOSIS — M545 Low back pain, unspecified: Secondary | ICD-10-CM

## 2018-03-08 DIAGNOSIS — M5442 Lumbago with sciatica, left side: Secondary | ICD-10-CM | POA: Diagnosis not present

## 2018-03-08 DIAGNOSIS — R69 Illness, unspecified: Secondary | ICD-10-CM | POA: Diagnosis not present

## 2018-03-08 DIAGNOSIS — G8929 Other chronic pain: Secondary | ICD-10-CM

## 2018-03-08 DIAGNOSIS — M5441 Lumbago with sciatica, right side: Secondary | ICD-10-CM | POA: Diagnosis not present

## 2018-03-08 NOTE — Progress Notes (Signed)
Office Visit Note   Patient: Samantha [name]           Date of Birth: Jun 25, 1929           MRN: [id number] Visit Date: 03/08/2018              Requested by: Lajean Manes, MD [address] [place], [place] [address] PCP: Lajean Manes, MD   Assessment & Plan: Visit Diagnoses:  1. Chronic bilateral low back pain with bilateral sciatica     Plan: Osteoarthritis left knee with CPPD.  Presently knee is not as symptomatic as her lumbar spine.  Has history of chronic low back pain diagnosed 2 to 3 months ago film.  Has been to 5 sessions of physical therapy.  She has considerable pain when she stands for a length of time and when she particular distance.  I think she has stenosis.  Will obtain MRI scan.  Will apply lumbar support to help as she lives by herself.  Office visit over 30 minutes regarding her diagnosis and treatment options for her back and her knee percent of the time in counseling.  Follow-Up Instructions: Return after MRI L-S spine.   Orders:  No orders of the defined types were placed in this encounter.  No orders of the defined types were placed in this encounter.     Procedures: No procedures performed   Clinical Data: No additional findings.   Subjective: Chief Complaint  Patient presents with  . Follow-up    DISCUSS BACK AND ALSO HAVING L KNEE PAIN, DREW FLUID OFF 12/10/2017 THE KNEE  DID 5 THERAPY SESSIONS BUT STILL HAVING PAIN  Samantha [name] is a history of arthritis of her left knee with associated CPPD.  Several months ago I injected her knee with cortisone and made a big difference.  She is has had some recurrent soreness but not having any swelling or increased heat.  She is also had a chronic problem with her lumbar spine with evidence of arthritis by plain film.  She is had a course of physical therapy but is very concerned that she may have episodes of her "back going out".  She has had these in the past.  She is having considerable  trouble when she stands for a length of time for her to work back buttock and left and right lower extremity.  No recent injury or trauma.  She does use a cane for balance  HPI  Review of Systems  Constitutional: Positive for fatigue. Negative for fever.  HENT: Negative for ear pain.   Eyes: Negative for pain.  Respiratory: Negative for cough and shortness of breath.   Cardiovascular: Positive for leg swelling.  Gastrointestinal: Negative for constipation and diarrhea.  Genitourinary: Negative for difficulty urinating.  Musculoskeletal: Positive for back pain. Negative for neck pain.  Skin: Negative for rash.  Allergic/Immunologic: Negative for food allergies.  Neurological: Positive for weakness.  Hematological: Does not bruise/bleed easily.  Psychiatric/Behavioral: Positive for sleep disturbance.     Objective: Vital Signs: BP (!) 126/91 (BP Location: Left Arm, Patient Position: Sitting, Cuff Size: Normal)   Pulse 79   Ht 5\' 2"  (1.575 m)   Wt 149 lb (67.6 kg)   BMI 27.25 kg/m   Physical Exam  Constitutional: She is oriented to person, place, and time. She appears well-developed and well-nourished.  HENT:  Mouth/Throat: Oropharynx is clear and moist.  Eyes: Pupils are equal, round, and reactive to light. EOM are normal.  Pulmonary/Chest: Effort normal.  Neurological: She is alert and oriented to person, place, and time.  Skin: Skin is warm and dry.  Psychiatric: She has a normal mood and affect. Her behavior is normal.    Ortho Exam awake alert and oriented x3.  Comfortable sitting.  Straight leg raise was slightly positive on the left at 90 degrees for back pain.  Negative on the right.  Mild ankle swelling bilaterally.  Neurologically appears to be intact.  Some areas of discomfort about the left knee but no effusion.  Lacks a few degrees to full extension based on her arthritis.  Less range of motion of both of her hips.  Has percussible tenderness of the lumbar spine more  to the left than the right  Specialty Comments:  No specialty comments available.  Imaging: No results found.   PMFS History: Patient Active Problem List   Diagnosis Date Noted  . Chronic bilateral low back pain with bilateral sciatica 03/08/2018  . Abnormal CT scan, chest 08/08/2016  . Near syncope 01/06/2015  . Left leg weakness 01/06/2015  . High cholesterol   . Hypothyroidism   . Ventricular ectopy   . Chest pain 05/22/2014  . Left bundle branch block 05/22/2014  . PVC's (premature ventricular contractions) 05/22/2014  . Intrinsic asthma 02/14/2010  . HOARSENESS 02/14/2010  . CHEST PAIN, ATYPICAL 02/14/2010  . ALLERGIC RHINITIS 02/13/2010  . G E R D 02/13/2010   Past Medical History:  Diagnosis Date  . Arthritis   . Asthma   . GERD (gastroesophageal reflux disease)   . High cholesterol   . Hypothyroidism   . LBBB (left bundle branch block)   . Ventricular ectopy     Family History  Problem Relation Age of Onset  . Heart attack Mother   . Throat cancer Father   . Congestive Heart Failure Sister   . Congestive Heart Failure Brother     Past Surgical History:  Procedure Laterality Date  . CARDIOVERSION N/A 11/27/2015   Procedure: CARDIOVERSION;  Surgeon: Josue Hector, MD;  Location: [facility] ENDOSCOPY;  Service: Cardiovascular;  Laterality: N/A;  . KNEE ARTHROSCOPY     Social History   Occupational History  . Not on file  Tobacco Use  . Smoking status: Passive Smoke Exposure - Never Smoker  . Smokeless tobacco: Never Used  Substance and Sexual Activity  . Alcohol use: No    Alcohol/week: 0.0 standard drinks  . Drug use: No  . Sexual activity: Not on file

## 2018-03-16 DIAGNOSIS — M810 Age-related osteoporosis without current pathological fracture: Secondary | ICD-10-CM | POA: Diagnosis not present

## 2018-03-17 ENCOUNTER — Ambulatory Visit
Admission: RE | Admit: 2018-03-17 | Discharge: 2018-03-17 | Disposition: A | Discharge status: Home / Self Care | Payer: Medicare HMO | Source: Ambulatory Visit | Attending: Orthopaedic Surgery | Admitting: Orthopaedic Surgery

## 2018-03-17 DIAGNOSIS — M545 Low back pain, unspecified: Secondary | ICD-10-CM

## 2018-03-17 DIAGNOSIS — M47819 Spondylosis without myelopathy or radiculopathy, site unspecified: Secondary | ICD-10-CM | POA: Diagnosis not present

## 2018-03-17 DIAGNOSIS — G8929 Other chronic pain: Secondary | ICD-10-CM

## 2018-03-24 ENCOUNTER — Ambulatory Visit: Payer: Medicare HMO | Admitting: Podiatry

## 2018-03-24 ENCOUNTER — Encounter: Payer: Self-pay | Admitting: Podiatry

## 2018-03-24 DIAGNOSIS — B351 Tinea unguium: Secondary | ICD-10-CM

## 2018-03-24 DIAGNOSIS — M79676 Pain in unspecified toe(s): Secondary | ICD-10-CM

## 2018-03-28 NOTE — Progress Notes (Signed)
   SUBJECTIVE Patient presents to office today complaining of elongated, thickened nails that cause pain while ambulating in shoes. She is unable to trim her own nails. Patient is here for further evaluation and treatment.  Past Medical History:  Diagnosis Date  . Arthritis   . Asthma   . GERD (gastroesophageal reflux disease)   . High cholesterol   . Hypothyroidism   . LBBB (left bundle branch block)   . Ventricular ectopy     OBJECTIVE General Patient is awake, alert, and oriented x 3 and in no acute distress. Derm Skin is dry and supple bilateral. Negative open lesions or macerations. Remaining integument unremarkable. Nails are tender, long, thickened and dystrophic with subungual debris, consistent with onychomycosis, 1-5 bilateral. No signs of infection noted. Vasc  DP and PT pedal pulses palpable bilaterally. Temperature gradient within normal limits.  Neuro Epicritic and protective threshold sensation grossly intact bilaterally.  Musculoskeletal Exam No symptomatic pedal deformities noted bilateral. Muscular strength within normal limits.  ASSESSMENT 1. Onychodystrophic nails 1-5 bilateral with hyperkeratosis of nails.  2. Onychomycosis of nail due to dermatophyte bilateral 3. Pain in foot bilateral  PLAN OF CARE 1. Patient evaluated today.  2. Instructed to maintain good pedal hygiene and foot care.  3. Mechanical debridement of nails 1-5 bilaterally performed using a nail nipper. Filed with dremel without incident.  4. Recommended OTC insoles from [employer] or [employer].  5. Return to clinic in 3 mos.    Edrick Kins, DPM [facility]  Dr. Edrick Kins, [place]                                        [place], [place] [address]                Office [phone]  Fax [phone]

## 2018-03-29 ENCOUNTER — Encounter (INDEPENDENT_AMBULATORY_CARE_PROVIDER_SITE_OTHER): Payer: Self-pay | Admitting: Orthopaedic Surgery

## 2018-03-29 ENCOUNTER — Other Ambulatory Visit (INDEPENDENT_AMBULATORY_CARE_PROVIDER_SITE_OTHER): Payer: Self-pay | Admitting: Radiology

## 2018-03-29 ENCOUNTER — Ambulatory Visit (INDEPENDENT_AMBULATORY_CARE_PROVIDER_SITE_OTHER): Payer: Medicare HMO | Admitting: Orthopaedic Surgery

## 2018-03-29 VITALS — BP 126/59 | HR 74 | Ht 62.0 in | Wt 149.0 lb

## 2018-03-29 DIAGNOSIS — M545 Low back pain, unspecified: Secondary | ICD-10-CM

## 2018-03-29 DIAGNOSIS — G8929 Other chronic pain: Secondary | ICD-10-CM

## 2018-03-29 NOTE — Progress Notes (Signed)
Office Visit Note   Patient: Samantha [name]           Date of Birth: 19-Mar-1930           MRN: [id number] Visit Date: 03/29/2018              Requested by: Lajean Manes, MD [address] [place], [place] [address] PCP: Lajean Manes, MD   Assessment & Plan: Visit Diagnoses:  1. Chronic bilateral low back pain without sciatica     Plan: MRI scan demonstrates mild multilevel spondylosis for her age.  No significant spinal stenosis or nerve root encroachment indeterminate lesion posteriorly in the L3 vertebral body that is thought to be benign.  Pain is mostly in the lumbar spine without significant referred discomfort.  Long discussion regarding her diagnosis.  We will try a course of physical therapy and have her return in 6 weeks  Follow-Up Instructions: Return in about 6 weeks (around 05/10/2018).   Orders:  No orders of the defined types were placed in this encounter.  No orders of the defined types were placed in this encounter.     Procedures: No procedures performed   Clinical Data: No additional findings.   Subjective: Chief Complaint  Patient presents with  . Follow-up    MRI REVIEW L SPINE  No change in symptoms.  Predominate low back pain.  Little if any discomfort.of the lower extremity.  Does have some issues with knee osteoarthritis and some lower extremity swelling  HPI  Review of Systems  Constitutional: Negative for fatigue and fever.  HENT: Negative for ear pain.   Eyes: Negative for pain.  Respiratory: Negative for cough and shortness of breath.   Cardiovascular: Negative for leg swelling.  Gastrointestinal: Negative for constipation and diarrhea.  Genitourinary: Negative for difficulty urinating.  Musculoskeletal: Negative for back pain and neck pain.  Skin: Negative for rash.  Allergic/Immunologic: Negative for food allergies.  Neurological: Positive for weakness. Negative for numbness.  Hematological: Does not bruise/bleed  easily.  Psychiatric/Behavioral: Positive for sleep disturbance.     Objective: Vital Signs: BP (!) 126/59 (BP Location: Left Arm, Patient Position: Sitting, Cuff Size: Normal)   Pulse 74   Ht 5\' 2"  (1.575 m)   Wt 149 lb (67.6 kg)   BMI 27.25 kg/m   Physical Exam  Constitutional: She is oriented to person, place, and time. She appears well-developed and well-nourished.  HENT:  Mouth/Throat: Oropharynx is clear and moist.  Eyes: Pupils are equal, round, and reactive to light. EOM are normal.  Pulmonary/Chest: Effort normal.  Neurological: She is alert and oriented to person, place, and time.  Skin: Skin is warm and dry.  Psychiatric: She has a normal mood and affect. Her behavior is normal.    Ortho Exam awake alert and oriented x3.  Comfortable sitting.  Straight leg raise negative bilaterally.  Painless range of motion both hips.  No percussible tenderness of the lumbar spine. Specialty Comments:  No specialty comments available.  Imaging: No results found.   PMFS History: Patient Active Problem List   Diagnosis Date Noted  . Chronic bilateral low back pain with bilateral sciatica 03/08/2018  . Abnormal CT scan, chest 08/08/2016  . Near syncope 01/06/2015  . Left leg weakness 01/06/2015  . High cholesterol   . Hypothyroidism   . Ventricular ectopy   . Chest pain 05/22/2014  . Left bundle branch block 05/22/2014  . PVC's (premature ventricular contractions) 05/22/2014  . Intrinsic asthma 02/14/2010  .  HOARSENESS 02/14/2010  . CHEST PAIN, ATYPICAL 02/14/2010  . ALLERGIC RHINITIS 02/13/2010  . G E R D 02/13/2010   Past Medical History:  Diagnosis Date  . Arthritis   . Asthma   . GERD (gastroesophageal reflux disease)   . High cholesterol   . Hypothyroidism   . LBBB (left bundle branch block)   . Ventricular ectopy     Family History  Problem Relation Age of Onset  . Heart attack Mother   . Throat cancer Father   . Congestive Heart Failure Sister   .  Congestive Heart Failure Brother     Past Surgical History:  Procedure Laterality Date  . CARDIOVERSION N/A 11/27/2015   Procedure: CARDIOVERSION;  Surgeon: Josue Hector, MD;  Location: [facility] ENDOSCOPY;  Service: Cardiovascular;  Laterality: N/A;  . KNEE ARTHROSCOPY     Social History   Occupational History  . Not on file  Tobacco Use  . Smoking status: Passive Smoke Exposure - Never Smoker  . Smokeless tobacco: Never Used  Substance and Sexual Activity  . Alcohol use: No    Alcohol/week: 0.0 standard drinks  . Drug use: No  . Sexual activity: Not on file

## 2018-04-02 ENCOUNTER — Emergency Department (HOSPITAL_COMMUNITY): Payer: Medicare HMO

## 2018-04-02 ENCOUNTER — Inpatient Hospital Stay (HOSPITAL_COMMUNITY)
Admission: EM | Admit: 2018-04-02 | Discharge: 2018-04-13 | DRG: 958 | Disposition: A | Discharge status: Skilled Nursing Facility | Payer: Medicare HMO | Attending: General Surgery | Admitting: General Surgery

## 2018-04-02 ENCOUNTER — Ambulatory Visit (INDEPENDENT_AMBULATORY_CARE_PROVIDER_SITE_OTHER): Payer: Medicare HMO | Admitting: Orthopaedic Surgery

## 2018-04-02 DIAGNOSIS — I480 Paroxysmal atrial fibrillation: Secondary | ICD-10-CM | POA: Diagnosis present

## 2018-04-02 DIAGNOSIS — R339 Retention of urine, unspecified: Secondary | ICD-10-CM | POA: Diagnosis not present

## 2018-04-02 DIAGNOSIS — S82831A Other fracture of upper and lower end of right fibula, initial encounter for closed fracture: Secondary | ICD-10-CM | POA: Diagnosis not present

## 2018-04-02 DIAGNOSIS — M7989 Other specified soft tissue disorders: Secondary | ICD-10-CM | POA: Diagnosis not present

## 2018-04-02 DIAGNOSIS — R1312 Dysphagia, oropharyngeal phase: Secondary | ICD-10-CM | POA: Diagnosis not present

## 2018-04-02 DIAGNOSIS — Z79899 Other long term (current) drug therapy: Secondary | ICD-10-CM | POA: Diagnosis not present

## 2018-04-02 DIAGNOSIS — Z7951 Long term (current) use of inhaled steroids: Secondary | ICD-10-CM

## 2018-04-02 DIAGNOSIS — S32502D Unspecified fracture of left pubis, subsequent encounter for fracture with routine healing: Secondary | ICD-10-CM | POA: Diagnosis not present

## 2018-04-02 DIAGNOSIS — S82201A Unspecified fracture of shaft of right tibia, initial encounter for closed fracture: Secondary | ICD-10-CM | POA: Diagnosis not present

## 2018-04-02 DIAGNOSIS — S299XXA Unspecified injury of thorax, initial encounter: Secondary | ICD-10-CM | POA: Diagnosis not present

## 2018-04-02 DIAGNOSIS — S0100XA Unspecified open wound of scalp, initial encounter: Secondary | ICD-10-CM | POA: Diagnosis not present

## 2018-04-02 DIAGNOSIS — I609 Nontraumatic subarachnoid hemorrhage, unspecified: Secondary | ICD-10-CM | POA: Diagnosis not present

## 2018-04-02 DIAGNOSIS — T1490XA Injury, unspecified, initial encounter: Secondary | ICD-10-CM | POA: Diagnosis present

## 2018-04-02 DIAGNOSIS — T82868A Thrombosis of vascular prosthetic devices, implants and grafts, initial encounter: Secondary | ICD-10-CM | POA: Diagnosis not present

## 2018-04-02 DIAGNOSIS — S066X0D Traumatic subarachnoid hemorrhage without loss of consciousness, subsequent encounter: Secondary | ICD-10-CM | POA: Diagnosis not present

## 2018-04-02 DIAGNOSIS — S81812A Laceration without foreign body, left lower leg, initial encounter: Secondary | ICD-10-CM | POA: Diagnosis present

## 2018-04-02 DIAGNOSIS — I10 Essential (primary) hypertension: Secondary | ICD-10-CM | POA: Diagnosis present

## 2018-04-02 DIAGNOSIS — Z880 Allergy status to penicillin: Secondary | ICD-10-CM

## 2018-04-02 DIAGNOSIS — Z741 Need for assistance with personal care: Secondary | ICD-10-CM | POA: Diagnosis not present

## 2018-04-02 DIAGNOSIS — S066X0A Traumatic subarachnoid hemorrhage without loss of consciousness, initial encounter: Secondary | ICD-10-CM | POA: Diagnosis not present

## 2018-04-02 DIAGNOSIS — S82101A Unspecified fracture of upper end of right tibia, initial encounter for closed fracture: Secondary | ICD-10-CM | POA: Diagnosis not present

## 2018-04-02 DIAGNOSIS — Y92410 Unspecified street and highway as the place of occurrence of the external cause: Secondary | ICD-10-CM

## 2018-04-02 DIAGNOSIS — M2550 Pain in unspecified joint: Secondary | ICD-10-CM | POA: Diagnosis not present

## 2018-04-02 DIAGNOSIS — S32502A Unspecified fracture of left pubis, initial encounter for closed fracture: Secondary | ICD-10-CM | POA: Diagnosis not present

## 2018-04-02 DIAGNOSIS — S82291A Other fracture of shaft of right tibia, initial encounter for closed fracture: Secondary | ICD-10-CM | POA: Diagnosis not present

## 2018-04-02 DIAGNOSIS — Z881 Allergy status to other antibiotic agents status: Secondary | ICD-10-CM | POA: Diagnosis not present

## 2018-04-02 DIAGNOSIS — E785 Hyperlipidemia, unspecified: Secondary | ICD-10-CM | POA: Diagnosis not present

## 2018-04-02 DIAGNOSIS — Y9301 Activity, walking, marching and hiking: Secondary | ICD-10-CM | POA: Diagnosis not present

## 2018-04-02 DIAGNOSIS — E039 Hypothyroidism, unspecified: Secondary | ICD-10-CM | POA: Diagnosis present

## 2018-04-02 DIAGNOSIS — K219 Gastro-esophageal reflux disease without esophagitis: Secondary | ICD-10-CM | POA: Diagnosis present

## 2018-04-02 DIAGNOSIS — S066X9A Traumatic subarachnoid hemorrhage with loss of consciousness of unspecified duration, initial encounter: Secondary | ICD-10-CM | POA: Diagnosis present

## 2018-04-02 DIAGNOSIS — M6281 Muscle weakness (generalized): Secondary | ICD-10-CM | POA: Diagnosis not present

## 2018-04-02 DIAGNOSIS — S199XXA Unspecified injury of neck, initial encounter: Secondary | ICD-10-CM | POA: Diagnosis not present

## 2018-04-02 DIAGNOSIS — S3993XA Unspecified injury of pelvis, initial encounter: Secondary | ICD-10-CM | POA: Diagnosis not present

## 2018-04-02 DIAGNOSIS — S0101XA Laceration without foreign body of scalp, initial encounter: Secondary | ICD-10-CM | POA: Diagnosis present

## 2018-04-02 DIAGNOSIS — Z7901 Long term (current) use of anticoagulants: Secondary | ICD-10-CM | POA: Diagnosis not present

## 2018-04-02 DIAGNOSIS — S32512A Fracture of superior rim of left pubis, initial encounter for closed fracture: Secondary | ICD-10-CM | POA: Diagnosis not present

## 2018-04-02 DIAGNOSIS — R40241 Glasgow coma scale score 13-15, unspecified time: Secondary | ICD-10-CM | POA: Diagnosis present

## 2018-04-02 DIAGNOSIS — T85868A Thrombosis due to other internal prosthetic devices, implants and grafts, initial encounter: Secondary | ICD-10-CM | POA: Diagnosis not present

## 2018-04-02 DIAGNOSIS — I447 Left bundle-branch block, unspecified: Secondary | ICD-10-CM | POA: Diagnosis present

## 2018-04-02 DIAGNOSIS — M542 Cervicalgia: Secondary | ICD-10-CM | POA: Diagnosis not present

## 2018-04-02 DIAGNOSIS — R000 Tachycardia, unspecified: Secondary | ICD-10-CM | POA: Diagnosis not present

## 2018-04-02 DIAGNOSIS — M96671 Fracture of tibia or fibula following insertion of orthopedic implant, joint prosthesis, or bone plate, right leg: Secondary | ICD-10-CM | POA: Diagnosis not present

## 2018-04-02 DIAGNOSIS — S82391D Other fracture of lower end of right tibia, subsequent encounter for closed fracture with routine healing: Secondary | ICD-10-CM | POA: Diagnosis not present

## 2018-04-02 DIAGNOSIS — S82391A Other fracture of lower end of right tibia, initial encounter for closed fracture: Secondary | ICD-10-CM | POA: Diagnosis not present

## 2018-04-02 DIAGNOSIS — D62 Acute posthemorrhagic anemia: Secondary | ICD-10-CM | POA: Diagnosis not present

## 2018-04-02 DIAGNOSIS — Z411 Encounter for cosmetic surgery: Secondary | ICD-10-CM | POA: Diagnosis not present

## 2018-04-02 DIAGNOSIS — Y828 Other medical devices associated with adverse incidents: Secondary | ICD-10-CM | POA: Diagnosis not present

## 2018-04-02 DIAGNOSIS — S82491A Other fracture of shaft of right fibula, initial encounter for closed fracture: Secondary | ICD-10-CM | POA: Diagnosis not present

## 2018-04-02 DIAGNOSIS — R413 Other amnesia: Secondary | ICD-10-CM | POA: Diagnosis not present

## 2018-04-02 DIAGNOSIS — S82401A Unspecified fracture of shaft of right fibula, initial encounter for closed fracture: Secondary | ICD-10-CM | POA: Diagnosis not present

## 2018-04-02 DIAGNOSIS — S06899A Other specified intracranial injury with loss of consciousness of unspecified duration, initial encounter: Secondary | ICD-10-CM | POA: Diagnosis not present

## 2018-04-02 DIAGNOSIS — S0181XD Laceration without foreign body of other part of head, subsequent encounter: Secondary | ICD-10-CM | POA: Diagnosis not present

## 2018-04-02 DIAGNOSIS — S3991XA Unspecified injury of abdomen, initial encounter: Secondary | ICD-10-CM | POA: Diagnosis not present

## 2018-04-02 DIAGNOSIS — Z419 Encounter for procedure for purposes other than remedying health state, unspecified: Secondary | ICD-10-CM

## 2018-04-02 DIAGNOSIS — R41841 Cognitive communication deficit: Secondary | ICD-10-CM | POA: Diagnosis not present

## 2018-04-02 DIAGNOSIS — I4891 Unspecified atrial fibrillation: Secondary | ICD-10-CM | POA: Diagnosis not present

## 2018-04-02 DIAGNOSIS — R52 Pain, unspecified: Secondary | ICD-10-CM | POA: Diagnosis not present

## 2018-04-02 DIAGNOSIS — Z7401 Bed confinement status: Secondary | ICD-10-CM | POA: Diagnosis not present

## 2018-04-02 DIAGNOSIS — R2689 Other abnormalities of gait and mobility: Secondary | ICD-10-CM | POA: Diagnosis not present

## 2018-04-02 LAB — COMPREHENSIVE METABOLIC PANEL
ALBUMIN: 2.9 g/dL — AB (ref 3.5–5.0)
ALT: 45 U/L — ABNORMAL HIGH (ref 0–44)
AST: 84 U/L — AB (ref 15–41)
Alkaline Phosphatase: 82 U/L (ref 38–126)
Anion gap: 7 (ref 5–15)
BILIRUBIN TOTAL: 0.5 mg/dL (ref 0.3–1.2)
BUN: 13 mg/dL (ref 8–23)
CO2: 27 mmol/L (ref 22–32)
Calcium: 8.8 mg/dL — ABNORMAL LOW (ref 8.9–10.3)
Chloride: 104 mmol/L (ref 98–111)
Creatinine, Ser: 0.73 mg/dL (ref 0.44–1.00)
GFR calc Af Amer: >60 mL/min (ref >60)
GFR calc non Af Amer: >60 mL/min (ref >60)
GLUCOSE: 107 mg/dL — AB (ref 70–99)
POTASSIUM: 4.2 mmol/L (ref 3.5–5.1)
Sodium: 138 mmol/L (ref 135–145)
TOTAL PROTEIN: 5.7 g/dL — AB (ref 6.5–8.1)

## 2018-04-02 LAB — I-STAT TROPONIN, ED: Troponin i, poc: 0 ng/mL (ref 0.00–0.08)

## 2018-04-02 LAB — CBC
HCT: 33.7 % — ABNORMAL LOW (ref 36.0–46.0)
HEMOGLOBIN: 10.2 g/dL — AB (ref 12.0–15.0)
MCH: 28.5 pg (ref 26.0–34.0)
MCHC: 30.3 g/dL (ref 30.0–36.0)
MCV: 94.1 fL (ref 80.0–100.0)
Platelets: 251 10*3/uL (ref 150–400)
RBC: 3.58 MIL/uL — ABNORMAL LOW (ref 3.87–5.11)
RDW: 14.4 % (ref 11.5–15.5)
WBC: 14.8 10*3/uL — ABNORMAL HIGH (ref 4.0–10.5)
nRBC: 0 % (ref 0.0–0.2)

## 2018-04-02 LAB — I-STAT CHEM 8, ED
BUN: 14 mg/dL (ref 8–23)
Calcium, Ion: 1.15 mmol/L (ref 1.15–1.40)
Chloride: 102 mmol/L (ref 98–111)
Creatinine, Ser: 0.8 mg/dL (ref 0.44–1.00)
Glucose, Bld: 105 mg/dL — ABNORMAL HIGH (ref 70–99)
HCT: 31 % — ABNORMAL LOW (ref 36.0–46.0)
Hemoglobin: 10.5 g/dL — ABNORMAL LOW (ref 12.0–15.0)
Potassium: 4 mmol/L (ref 3.5–5.1)
SODIUM: 138 mmol/L (ref 135–145)
TCO2: 27 mmol/L (ref 22–32)

## 2018-04-02 LAB — I-STAT CG4 LACTIC ACID, ED: Lactic Acid, Venous: 1.81 mmol/L (ref 0.5–1.9)

## 2018-04-02 LAB — ABO/RH: ABO/RH(D): O POS

## 2018-04-02 LAB — PROTIME-INR
INR: 1.33
Prothrombin Time: 16.4 seconds — ABNORMAL HIGH (ref 11.4–15.2)

## 2018-04-02 LAB — ETHANOL

## 2018-04-02 MED ORDER — MORPHINE SULFATE (PF) 4 MG/ML IV SOLN
INTRAVENOUS | Status: AC
  Filled 2018-04-02: qty 1

## 2018-04-02 MED ORDER — LIDOCAINE HCL (PF) 1 % IJ SOLN
5.0000 mL | Freq: Once | INTRAMUSCULAR | Status: AC
  Administered 2018-04-02: 5 mL via INTRADERMAL
  Filled 2018-04-02: qty 5

## 2018-04-02 MED ORDER — FUROSEMIDE 10 MG/ML IJ SOLN
10.0000 mg | Freq: Once | INTRAMUSCULAR | Status: AC
  Administered 2018-04-02: 10 mg via INTRAVENOUS
  Filled 2018-04-02: qty 2

## 2018-04-02 MED ORDER — IOHEXOL 300 MG/ML  SOLN
100.0000 mL | Freq: Once | INTRAMUSCULAR | Status: AC | PRN
  Administered 2018-04-02: 100 mL via INTRAVENOUS

## 2018-04-02 MED ORDER — PROTHROMBIN COMPLEX CONC HUMAN 500 UNITS IV KIT
3254.0000 [IU] | PACK | Status: AC
  Administered 2018-04-02: 3254 [IU] via INTRAVENOUS
  Filled 2018-04-02: qty 3254
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
## 2018-04-02 NOTE — ED Notes (Signed)
Long leg splint applied to right leg by ortho tech

## 2018-04-02 NOTE — ED Notes (Signed)
Family at bedside. 

## 2018-04-02 NOTE — ED Notes (Signed)
Pt to CT at this time.
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
## 2018-04-02 NOTE — Procedures (Signed)
Central Venous Catheter Insertion Procedure Note Samantha [name] [id number] 08/24/1929  Procedure: Insertion of Central Venous Catheter Indications: urgent access  Procedure Details Consent: Unable to obtain consent because of emergent medical necessity. Maximum sterile technique was used including antiseptics and gloves. Skin prep: Chlorhexidine; local anesthetic administered A antimicrobial bonded/coated triple lumen catheter was placed in the left femoral vein due to emergent situation using the Seldinger technique.  Evaluation Blood flow good Complications: No apparent complications Patient did tolerate procedure well.   Rolm Bookbinder 04/02/2018, 8:36 PM
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
## 2018-04-02 NOTE — Progress Notes (Signed)
Orthopedic Tech Progress Note Patient Details:  Samantha [name] 06/02/1873 [id number]  Patient ID: Samantha [name], female   DOB: 06/02/1873, 84 y.o.   MRN: [id number]   Hildred Priest 04/02/2018, 6:21 PM Made level 2 trauma visit
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
## 2018-04-03 ENCOUNTER — Inpatient Hospital Stay: Payer: Self-pay

## 2018-04-03 ENCOUNTER — Encounter (HOSPITAL_COMMUNITY): Payer: Self-pay

## 2018-04-03 ENCOUNTER — Inpatient Hospital Stay (HOSPITAL_COMMUNITY): Payer: Medicare HMO

## 2018-04-03 LAB — URINALYSIS, ROUTINE W REFLEX MICROSCOPIC
BACTERIA UA: NONE SEEN
BILIRUBIN URINE: NEGATIVE
Glucose, UA: NEGATIVE mg/dL
Ketones, ur: NEGATIVE mg/dL
LEUKOCYTES UA: NEGATIVE
NITRITE: NEGATIVE
PROTEIN: NEGATIVE mg/dL
Specific Gravity, Urine: 1.019 (ref 1.005–1.030)
pH: 6 (ref 5.0–8.0)

## 2018-04-03 LAB — PREPARE FRESH FROZEN PLASMA

## 2018-04-03 LAB — BPAM FFP
Blood Product Expiration Date: 201911042359
Blood Product Expiration Date: 201911042359
ISSUE DATE / TIME: 201911011829
ISSUE DATE / TIME: 201911011829
Unit Type and Rh: 6200
Unit Type and Rh: 6200

## 2018-04-03 LAB — TYPE AND SCREEN
ABO/RH(D): O POS
ANTIBODY SCREEN: NEGATIVE
Unit division: 0
Unit division: 0

## 2018-04-03 LAB — BASIC METABOLIC PANEL
ANION GAP: 5 (ref 5–15)
BUN: 9 mg/dL (ref 8–23)
CHLORIDE: 112 mmol/L — AB (ref 98–111)
CO2: 22 mmol/L (ref 22–32)
Calcium: 7.9 mg/dL — ABNORMAL LOW (ref 8.9–10.3)
Creatinine, Ser: 0.82 mg/dL (ref 0.44–1.00)
GFR calc non Af Amer: >60 mL/min (ref >60)
Glucose, Bld: 151 mg/dL — ABNORMAL HIGH (ref 70–99)
Potassium: 3.8 mmol/L (ref 3.5–5.1)
Sodium: 139 mmol/L (ref 135–145)

## 2018-04-03 LAB — BPAM RBC
BLOOD PRODUCT EXPIRATION DATE: 201912012359
Blood Product Expiration Date: 201912012359
ISSUE DATE / TIME: 201911011829
ISSUE DATE / TIME: 201911011829
Unit Type and Rh: 9500
Unit Type and Rh: 9500

## 2018-04-03 LAB — CBC
HCT: 29.9 % — ABNORMAL LOW (ref 36.0–46.0)
Hemoglobin: 9.2 g/dL — ABNORMAL LOW (ref 12.0–15.0)
MCH: 27.9 pg (ref 26.0–34.0)
MCHC: 30.8 g/dL (ref 30.0–36.0)
MCV: 90.6 fL (ref 80.0–100.0)
Platelets: 163 10*3/uL (ref 150–400)
RBC: 3.3 MIL/uL — ABNORMAL LOW (ref 3.87–5.11)
RDW: 14.7 % (ref 11.5–15.5)
WBC: 11 10*3/uL — AB (ref 4.0–10.5)
nRBC: 0 % (ref 0.0–0.2)

## 2018-04-03 LAB — PROTIME-INR
INR: 1.19
Prothrombin Time: 15 seconds (ref 11.4–15.2)

## 2018-04-03 LAB — MRSA PCR SCREENING: MRSA by PCR: NEGATIVE

## 2018-04-03 LAB — CDS SEROLOGY

## 2018-04-03 MED ORDER — MONTELUKAST SODIUM 10 MG PO TABS
10.0000 mg | ORAL_TABLET | Freq: Every day | ORAL | Status: DC
  Administered 2018-04-03 – 2018-04-12 (×11): 10 mg via ORAL
  Filled 2018-04-03 (×11): qty 1

## 2018-04-03 MED ORDER — FUROSEMIDE 40 MG PO TABS
40.0000 mg | ORAL_TABLET | Freq: Every day | ORAL | Status: DC
  Administered 2018-04-03 – 2018-04-13 (×11): 40 mg via ORAL
  Filled 2018-04-03 (×11): qty 1

## 2018-04-03 MED ORDER — MORPHINE SULFATE (PF) 2 MG/ML IV SOLN
1.0000 mg | INTRAVENOUS | Status: DC | PRN
  Administered 2018-04-06 – 2018-04-12 (×11): 1 mg via INTRAVENOUS
  Filled 2018-04-03 (×12): qty 1

## 2018-04-03 MED ORDER — SODIUM CHLORIDE 0.9 % IV SOLN
INTRAVENOUS | Status: DC
  Administered 2018-04-03 – 2018-04-09 (×7): via INTRAVENOUS

## 2018-04-03 MED ORDER — BISACODYL 10 MG RE SUPP: 10.0000 mg | Freq: Every day | RECTAL | Status: DC | PRN

## 2018-04-03 MED ORDER — LEVOTHYROXINE SODIUM 100 MCG PO TABS
100.0000 ug | ORAL_TABLET | Freq: Every day | ORAL | Status: DC
  Administered 2018-04-03 – 2018-04-13 (×11): 100 ug via ORAL
  Filled 2018-04-03 (×11): qty 1

## 2018-04-03 MED ORDER — MOMETASONE FURO-FORMOTEROL FUM 100-5 MCG/ACT IN AERO
2.0000 | INHALATION_SPRAY | Freq: Two times a day (BID) | RESPIRATORY_TRACT | Status: DC
  Administered 2018-04-03 – 2018-04-13 (×19): 2 via RESPIRATORY_TRACT
  Filled 2018-04-03 (×2): qty 8.8

## 2018-04-03 MED ORDER — ORAL CARE MOUTH RINSE
15.0000 mL | Freq: Two times a day (BID) | OROMUCOSAL | Status: DC
  Administered 2018-04-03 – 2018-04-08 (×10): 15 mL via OROMUCOSAL

## 2018-04-03 MED ORDER — PANTOPRAZOLE SODIUM 40 MG PO TBEC
40.0000 mg | DELAYED_RELEASE_TABLET | Freq: Every day | ORAL | Status: DC
  Administered 2018-04-03 – 2018-04-13 (×10): 40 mg via ORAL
  Filled 2018-04-03 (×10): qty 1

## 2018-04-03 MED ORDER — ONDANSETRON 4 MG PO TBDP: 4.0000 mg | ORAL_TABLET | Freq: Four times a day (QID) | ORAL | Status: DC | PRN

## 2018-04-03 MED ORDER — METOPROLOL SUCCINATE ER 50 MG PO TB24
50.0000 mg | ORAL_TABLET | Freq: Every day | ORAL | Status: DC
  Administered 2018-04-04: 50 mg via ORAL
  Filled 2018-04-03: qty 1

## 2018-04-03 MED ORDER — ONDANSETRON HCL 4 MG/2ML IJ SOLN
4.0000 mg | Freq: Four times a day (QID) | INTRAMUSCULAR | Status: DC | PRN
  Administered 2018-04-05: 4 mg via INTRAVENOUS

## 2018-04-03 MED ORDER — PANTOPRAZOLE SODIUM 40 MG IV SOLR: 40.0000 mg | Freq: Every day | INTRAVENOUS | Status: DC

## 2018-04-03 MED ORDER — TRAMADOL HCL 50 MG PO TABS
50.0000 mg | ORAL_TABLET | Freq: Four times a day (QID) | ORAL | Status: DC | PRN
  Administered 2018-04-03 – 2018-04-13 (×13): 50 mg via ORAL
  Filled 2018-04-03 (×15): qty 1

## 2018-04-03 NOTE — Progress Notes (Signed)
Per request of IV team, confirmed PICC order with Dr. Dema Severin, he confirmed triple lumen PICC.   I notified IV team nurse, Helene Kelp, of Dr. Orest Dikes verbal order confimation.

## 2018-04-03 NOTE — Progress Notes (Signed)
Cherie Ouch RN attempted PICC placement R basilic vessel, unable to successfully access vein.  This RN then attempted the R cephalic vessel.  Able only to thread PICC 25 cm before encountering blockage centrally.  Attempted multiple position changes and rad wire to place, but unsuccessful.  Tammy RN notified.  Pt aware.  Pt has adequate access, 2 PIV and femoral CVC. RA insertion sites cleaned with CHG and covered with dry guaze after pressure held.  Please advise if left arm attempt preferred over leaving current IV access.
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
## 2018-04-03 NOTE — Progress Notes (Signed)
Paged on call Trauma MD regarding hypotension.  MD made aware.
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
## 2018-04-04 ENCOUNTER — Other Ambulatory Visit: Payer: Self-pay | Admitting: Physician Assistant

## 2018-04-04 ENCOUNTER — Inpatient Hospital Stay: Payer: Self-pay

## 2018-04-04 DIAGNOSIS — I609 Nontraumatic subarachnoid hemorrhage, unspecified: Secondary | ICD-10-CM

## 2018-04-04 LAB — BASIC METABOLIC PANEL
Anion gap: 3 — ABNORMAL LOW (ref 5–15)
BUN: 6 mg/dL — AB (ref 8–23)
CALCIUM: 7.9 mg/dL — AB (ref 8.9–10.3)
CO2: 24 mmol/L (ref 22–32)
CREATININE: 0.67 mg/dL (ref 0.44–1.00)
Chloride: 107 mmol/L (ref 98–111)
GFR calc non Af Amer: >60 mL/min (ref >60)
Glucose, Bld: 118 mg/dL — ABNORMAL HIGH (ref 70–99)
Potassium: 3.4 mmol/L — ABNORMAL LOW (ref 3.5–5.1)
SODIUM: 134 mmol/L — AB (ref 135–145)

## 2018-04-04 LAB — CBC WITH DIFFERENTIAL/PLATELET
Abs Immature Granulocytes: 0.07 10*3/uL (ref 0.00–0.07)
BASOS ABS: 0 10*3/uL (ref 0.0–0.1)
Basophils Relative: 0 %
EOS PCT: 1 %
Eosinophils Absolute: 0.1 10*3/uL (ref 0.0–0.5)
HEMATOCRIT: 27.3 % — AB (ref 36.0–46.0)
Hemoglobin: 8.6 g/dL — ABNORMAL LOW (ref 12.0–15.0)
Immature Granulocytes: 1 %
Lymphocytes Relative: 15 %
Lymphs Abs: 1.8 10*3/uL (ref 0.7–4.0)
MCH: 28.7 pg (ref 26.0–34.0)
MCHC: 31.5 g/dL (ref 30.0–36.0)
MCV: 91 fL (ref 80.0–100.0)
Monocytes Absolute: 1.3 10*3/uL — ABNORMAL HIGH (ref 0.1–1.0)
Monocytes Relative: 11 %
NRBC: 0 % (ref 0.0–0.2)
Neutro Abs: 8.8 10*3/uL — ABNORMAL HIGH (ref 1.7–7.7)
Neutrophils Relative %: 72 %
Platelets: 133 10*3/uL — ABNORMAL LOW (ref 150–400)
RBC: 3 MIL/uL — AB (ref 3.87–5.11)
RDW: 14.7 % (ref 11.5–15.5)
WBC: 12.1 10*3/uL — AB (ref 4.0–10.5)

## 2018-04-04 MED ORDER — METOPROLOL TARTRATE 5 MG/5ML IV SOLN
5.0000 mg | Freq: Once | INTRAVENOUS | Status: AC
  Administered 2018-04-04: 5 mg via INTRAVENOUS

## 2018-04-04 MED ORDER — CEFAZOLIN SODIUM-DEXTROSE 2-4 GM/100ML-% IV SOLN
2.0000 g | INTRAVENOUS | Status: AC
  Administered 2018-04-05: 2 g via INTRAVENOUS
  Filled 2018-04-04: qty 100

## 2018-04-04 MED ORDER — WHITE PETROLATUM EX OINT
TOPICAL_OINTMENT | CUTANEOUS | Status: AC
  Administered 2018-04-04: 1
  Filled 2018-04-04: qty 28.35

## 2018-04-04 MED ORDER — POTASSIUM CHLORIDE 20 MEQ PO PACK
40.0000 meq | PACK | Freq: Once | ORAL | Status: AC
  Administered 2018-04-04: 40 meq via ORAL
  Filled 2018-04-04: qty 2

## 2018-04-04 MED ORDER — METOPROLOL TARTRATE 5 MG/5ML IV SOLN
2.5000 mg | INTRAVENOUS | Status: DC | PRN
  Administered 2018-04-09: 2.5 mg via INTRAVENOUS
  Filled 2018-04-04: qty 5

## 2018-04-04 MED ORDER — SODIUM CHLORIDE 0.9% FLUSH: 10.0000 mL | INTRAVENOUS | Status: DC | PRN

## 2018-04-04 MED ORDER — CHLORHEXIDINE GLUCONATE 4 % EX LIQD
60.0000 mL | Freq: Once | CUTANEOUS | Status: DC
  Filled 2018-04-04: qty 60

## 2018-04-04 MED ORDER — CHLORHEXIDINE GLUCONATE CLOTH 2 % EX PADS
6.0000 | MEDICATED_PAD | Freq: Every day | CUTANEOUS | Status: DC
  Administered 2018-04-04 – 2018-04-12 (×7): 6 via TOPICAL

## 2018-04-04 MED ORDER — SODIUM CHLORIDE 0.9% FLUSH
10.0000 mL | Freq: Two times a day (BID) | INTRAVENOUS | Status: DC
  Administered 2018-04-04 – 2018-04-11 (×13): 10 mL

## 2018-04-04 MED ORDER — METOPROLOL SUCCINATE ER 50 MG PO TB24
75.0000 mg | ORAL_TABLET | Freq: Every day | ORAL | Status: DC
  Administered 2018-04-05 – 2018-04-07 (×3): 75 mg via ORAL
  Filled 2018-04-04 (×3): qty 1

## 2018-04-04 MED ORDER — POVIDONE-IODINE 10 % EX SWAB: 2.0000 "application " | Freq: Once | CUTANEOUS | Status: DC

## 2018-04-04 MED ORDER — POLYETHYLENE GLYCOL 3350 17 G PO PACK
17.0000 g | PACK | ORAL | Status: DC
  Administered 2018-04-04 – 2018-04-10 (×4): 17 g via ORAL
  Filled 2018-04-04 (×5): qty 1

## 2018-04-04 MED ORDER — METOPROLOL TARTRATE 5 MG/5ML IV SOLN
INTRAVENOUS | Status: AC
  Filled 2018-04-04: qty 5

## 2018-04-04 NOTE — Progress Notes (Signed)
Hard neck collar removed and soft neck collar applied.
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
## 2018-04-04 NOTE — Progress Notes (Signed)
Pt apprears to be stable from NSU and cards standpoint.  I will tentatively schedule for surgery late tomorrow afternoon.  If something changes please notify me.  I will place NPO order for tonight at [facility].   Nicholes Stairs [phone]

## 2018-04-04 NOTE — Progress Notes (Signed)
Patient ID: Samantha [name], female   DOB: [birth year], 84 y.o.   MRN: [id number]  Sleeping this am Some issues with heart rate last night but stable this am  C-collar still in place due to neck pain, unable to clear at this point Left leg dressing intact and dry Right LE splint intact - no reported neuro changes  Right tib/fib fracture  Plan: Per [facility] for definitive treatment this week once stable NWB RLE C-spine clearance per trauma service

## 2018-04-04 NOTE — Progress Notes (Signed)
Orthopedic Tech Progress Note Patient Details:  Samantha [name] [birth year] [id number]  Ortho Devices Type of Ortho Device: Soft collar Ortho Device/Splint Interventions: Application   Post Interventions Patient Tolerated: Well Instructions Provided: Care of device   [place] Pink 04/04/2018, 11:44 AM

## 2018-04-04 NOTE — Progress Notes (Signed)
Peripherally Inserted Central Catheter/Midline Placement  The IV Nurse has discussed with the patient and/or persons authorized to consent for the patient, the purpose of this procedure and the potential benefits and risks involved with this procedure.  The benefits include less needle sticks, lab draws from the catheter, and the patient may be discharged home with the catheter. Risks include, but not limited to, infection, bleeding, blood clot (thrombus formation), and puncture of an artery; nerve damage and irregular heartbeat and possibility to perform a PICC exchange if needed/ordered by physician.  Alternatives to this procedure were also discussed.  Bard Power PICC patient education guide, fact sheet on infection prevention and patient information card has been provided to patient /or left at bedside.    PICC/Midline Placement Documentation  PICC Double Lumen 05/25/48 PICC Left Basilic 42 cm 0 cm (Active)  Indication for Insertion or Continuance of Line Vasoactive infusions;Prolonged intravenous therapies;Chronic illness with exacerbations (CF, Sickle Cell, etc.) 04/04/2018 11:49 AM  Exposed Catheter (cm) 0 cm 04/04/2018 11:49 AM  Site Assessment Clean;Dry;Intact 04/04/2018 11:49 AM  Lumen #1 Status Flushed;Saline locked;Blood return noted 04/04/2018 11:49 AM  Lumen #2 Status Flushed;Saline locked;Blood return noted 04/04/2018 11:49 AM  Dressing Type Transparent 04/04/2018 11:49 AM  Dressing Status Clean;Dry;Intact;Antimicrobial disc in place 04/04/2018 11:49 AM  Line Care Connections checked and tightened 04/04/2018 11:49 AM  Line Adjustment (NICU/IV Team Only) No 04/04/2018 11:49 AM  Dressing Intervention New dressing 04/04/2018 11:49 AM  Dressing Change Due 04/11/18 04/04/2018 11:49 AM       Rolena Infante 04/04/2018, 11:50 AM
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
## 2018-04-05 ENCOUNTER — Inpatient Hospital Stay (HOSPITAL_COMMUNITY): Payer: Medicare HMO | Admitting: Anesthesiology

## 2018-04-05 ENCOUNTER — Encounter (HOSPITAL_COMMUNITY): Payer: Self-pay

## 2018-04-05 ENCOUNTER — Encounter (HOSPITAL_COMMUNITY): Admission: EM | Disposition: A | Discharge status: Skilled Nursing Facility | Payer: Self-pay | Source: Home / Self Care

## 2018-04-05 ENCOUNTER — Inpatient Hospital Stay (HOSPITAL_COMMUNITY): Payer: Medicare HMO

## 2018-04-05 DIAGNOSIS — I480 Paroxysmal atrial fibrillation: Secondary | ICD-10-CM

## 2018-04-05 HISTORY — PX: TIBIA IM NAIL INSERTION: SHX2516

## 2018-04-05 LAB — CBC
HEMATOCRIT: 26.4 % — AB (ref 36.0–46.0)
HEMOGLOBIN: 8 g/dL — AB (ref 12.0–15.0)
MCH: 28.1 pg (ref 26.0–34.0)
MCHC: 30.3 g/dL (ref 30.0–36.0)
MCV: 92.6 fL (ref 80.0–100.0)
Platelets: 127 10*3/uL — ABNORMAL LOW (ref 150–400)
RBC: 2.85 MIL/uL — AB (ref 3.87–5.11)
RDW: 14.6 % (ref 11.5–15.5)
WBC: 15 10*3/uL — ABNORMAL HIGH (ref 4.0–10.5)
nRBC: 0 % (ref 0.0–0.2)

## 2018-04-05 LAB — BASIC METABOLIC PANEL
ANION GAP: 6 (ref 5–15)
BUN: 7 mg/dL — AB (ref 8–23)
CHLORIDE: 103 mmol/L (ref 98–111)
CO2: 25 mmol/L (ref 22–32)
Calcium: 8 mg/dL — ABNORMAL LOW (ref 8.9–10.3)
Creatinine, Ser: 0.67 mg/dL (ref 0.44–1.00)
GFR calc Af Amer: >60 mL/min (ref >60)
GLUCOSE: 106 mg/dL — AB (ref 70–99)
POTASSIUM: 3.8 mmol/L (ref 3.5–5.1)
Sodium: 134 mmol/L — ABNORMAL LOW (ref 135–145)

## 2018-04-05 LAB — SURGICAL PCR SCREEN
MRSA, PCR: NEGATIVE
Staphylococcus aureus: NEGATIVE

## 2018-04-05 LAB — BLOOD PRODUCT ORDER (VERBAL) VERIFICATION

## 2018-04-05 SURGERY — INSERTION, INTRAMEDULLARY ROD, TIBIA
Anesthesia: General | Site: Leg Lower | Laterality: Right

## 2018-04-05 MED ORDER — PHENYLEPHRINE HCL 10 MG/ML IJ SOLN
INTRAMUSCULAR | Status: DC | PRN
  Administered 2018-04-05: 120 ug via INTRAVENOUS
  Administered 2018-04-05: 80 ug via INTRAVENOUS
  Administered 2018-04-05: 120 ug via INTRAVENOUS
  Administered 2018-04-05: 80 ug via INTRAVENOUS

## 2018-04-05 MED ORDER — ONDANSETRON HCL 4 MG/2ML IJ SOLN
INTRAMUSCULAR | Status: AC
  Filled 2018-04-05: qty 2

## 2018-04-05 MED ORDER — DEXAMETHASONE SODIUM PHOSPHATE 10 MG/ML IJ SOLN
INTRAMUSCULAR | Status: AC
  Filled 2018-04-05: qty 1

## 2018-04-05 MED ORDER — LACTATED RINGERS IV SOLN
INTRAVENOUS | Status: DC
  Administered 2018-04-05: 16:00:00 via INTRAVENOUS

## 2018-04-05 MED ORDER — ROCURONIUM BROMIDE 50 MG/5ML IV SOSY
PREFILLED_SYRINGE | INTRAVENOUS | Status: AC
  Filled 2018-04-05: qty 5

## 2018-04-05 MED ORDER — METOCLOPRAMIDE HCL 5 MG PO TABS: 5.0000 mg | ORAL_TABLET | Freq: Three times a day (TID) | ORAL | Status: DC | PRN

## 2018-04-05 MED ORDER — SUGAMMADEX SODIUM 200 MG/2ML IV SOLN
INTRAVENOUS | Status: DC | PRN
  Administered 2018-04-05: 200 mg via INTRAVENOUS

## 2018-04-05 MED ORDER — OXYCODONE HCL 5 MG PO TABS: 5.0000 mg | ORAL_TABLET | Freq: Once | ORAL | Status: DC | PRN

## 2018-04-05 MED ORDER — FENTANYL CITRATE (PF) 250 MCG/5ML IJ SOLN
INTRAMUSCULAR | Status: AC
  Filled 2018-04-05: qty 5

## 2018-04-05 MED ORDER — CEFAZOLIN SODIUM-DEXTROSE 1-4 GM/50ML-% IV SOLN
1.0000 g | Freq: Four times a day (QID) | INTRAVENOUS | Status: AC
  Administered 2018-04-05 – 2018-04-06 (×3): 1 g via INTRAVENOUS
  Filled 2018-04-05 (×3): qty 50

## 2018-04-05 MED ORDER — MUPIROCIN 2 % EX OINT
1.0000 "application " | TOPICAL_OINTMENT | Freq: Two times a day (BID) | CUTANEOUS | Status: AC
  Administered 2018-04-06 (×2): 1 via NASAL
  Filled 2018-04-05: qty 22

## 2018-04-05 MED ORDER — FENTANYL CITRATE (PF) 100 MCG/2ML IJ SOLN
25.0000 ug | INTRAMUSCULAR | Status: DC | PRN
  Administered 2018-04-05 (×3): 25 ug via INTRAVENOUS

## 2018-04-05 MED ORDER — DEXAMETHASONE SODIUM PHOSPHATE 10 MG/ML IJ SOLN
INTRAMUSCULAR | Status: DC | PRN
  Administered 2018-04-05: 4 mg via INTRAVENOUS

## 2018-04-05 MED ORDER — METOCLOPRAMIDE HCL 5 MG/ML IJ SOLN: 5.0000 mg | Freq: Three times a day (TID) | INTRAMUSCULAR | Status: DC | PRN

## 2018-04-05 MED ORDER — FENTANYL CITRATE (PF) 250 MCG/5ML IJ SOLN
INTRAMUSCULAR | Status: DC | PRN
  Administered 2018-04-05: 75 ug via INTRAVENOUS

## 2018-04-05 MED ORDER — ACETAMINOPHEN 10 MG/ML IV SOLN: 1000.0000 mg | Freq: Once | INTRAVENOUS | Status: DC | PRN

## 2018-04-05 MED ORDER — OXYCODONE HCL 5 MG/5ML PO SOLN: 5.0000 mg | Freq: Once | ORAL | Status: DC | PRN

## 2018-04-05 MED ORDER — FENTANYL CITRATE (PF) 100 MCG/2ML IJ SOLN
INTRAMUSCULAR | Status: AC
  Filled 2018-04-05: qty 2

## 2018-04-05 MED ORDER — ONDANSETRON HCL 4 MG PO TABS: 4.0000 mg | ORAL_TABLET | Freq: Four times a day (QID) | ORAL | Status: DC | PRN

## 2018-04-05 MED ORDER — LIDOCAINE 2% (20 MG/ML) 5 ML SYRINGE
INTRAMUSCULAR | Status: AC
  Filled 2018-04-05: qty 5

## 2018-04-05 MED ORDER — ONDANSETRON HCL 4 MG/2ML IJ SOLN
4.0000 mg | Freq: Four times a day (QID) | INTRAMUSCULAR | Status: DC | PRN
  Administered 2018-04-08: 4 mg via INTRAVENOUS
  Filled 2018-04-05: qty 2

## 2018-04-05 MED ORDER — PROPOFOL 10 MG/ML IV BOLUS
INTRAVENOUS | Status: DC | PRN
  Administered 2018-04-05: 90 mg via INTRAVENOUS

## 2018-04-05 MED ORDER — NICARDIPINE HCL IN NACL 20-0.86 MG/200ML-% IV SOLN
INTRAVENOUS | Status: AC
  Filled 2018-04-05: qty 200

## 2018-04-05 MED ORDER — DOCUSATE SODIUM 100 MG PO CAPS
100.0000 mg | ORAL_CAPSULE | Freq: Two times a day (BID) | ORAL | Status: DC
  Administered 2018-04-11 – 2018-04-13 (×5): 100 mg via ORAL
  Filled 2018-04-05 (×9): qty 1

## 2018-04-05 MED ORDER — 0.9 % SODIUM CHLORIDE (POUR BTL) OPTIME
TOPICAL | Status: DC | PRN
  Administered 2018-04-05: 1000 mL

## 2018-04-05 MED ORDER — ROCURONIUM BROMIDE 100 MG/10ML IV SOLN
INTRAVENOUS | Status: DC | PRN
  Administered 2018-04-05: 50 mg via INTRAVENOUS

## 2018-04-05 MED ORDER — PROMETHAZINE HCL 25 MG/ML IJ SOLN: 6.2500 mg | INTRAMUSCULAR | Status: DC | PRN

## 2018-04-05 SURGICAL SUPPLY — 63 items
ALCOHOL 70% 16 OZ (MISCELLANEOUS) ×2 IMPLANT
BANDAGE ACE 4X5 VEL STRL LF (GAUZE/BANDAGES/DRESSINGS) ×2 IMPLANT
BANDAGE ACE 6X5 VEL STRL LF (GAUZE/BANDAGES/DRESSINGS) ×2 IMPLANT
BANDAGE ELASTIC 4 VELCRO ST LF (GAUZE/BANDAGES/DRESSINGS) ×1 IMPLANT
BANDAGE ELASTIC 6 VELCRO ST LF (GAUZE/BANDAGES/DRESSINGS) ×1 IMPLANT
BANDAGE ESMARK 6X9 LF (GAUZE/BANDAGES/DRESSINGS) ×1 IMPLANT
BIT DRILL CALIBRATED 4.2 (BIT) IMPLANT
BIT DRILL SHORT 3.2MM (DRILL) IMPLANT
BIT DRILL SHORT 4.2 (BIT) IMPLANT
BNDG CMPR 9X6 STRL LF SNTH (GAUZE/BANDAGES/DRESSINGS) ×1
BNDG ESMARK 6X9 LF (GAUZE/BANDAGES/DRESSINGS) ×2
COVER MAYO STAND STRL (DRAPES) ×2 IMPLANT
COVER SURGICAL LIGHT HANDLE (MISCELLANEOUS) ×2 IMPLANT
COVER WAND RF STERILE (DRAPES) ×2 IMPLANT
CUFF TOURNIQUET SINGLE 34IN LL (TOURNIQUET CUFF) ×2 IMPLANT
DRAPE C-ARM 42X72 X-RAY (DRAPES) ×2 IMPLANT
DRAPE C-ARMOR (DRAPES) ×2 IMPLANT
DRAPE HALF SHEET 40X57 (DRAPES) ×2 IMPLANT
DRAPE IMP U-DRAPE 54X76 (DRAPES) ×2 IMPLANT
DRAPE POUCH INSTRU U-SHP 10X18 (DRAPES) ×2 IMPLANT
DRAPE U-SHAPE 47X51 STRL (DRAPES) ×2 IMPLANT
DRAPE UTILITY XL STRL (DRAPES) ×4 IMPLANT
DRILL BIT CALIBRATED 4.2 (BIT) ×2
DRILL BIT SHORT 4.2 (BIT) ×4
DRILL SHORT 3.2MM (DRILL) ×2
DRSG ADAPTIC 3X8 NADH LF (GAUZE/BANDAGES/DRESSINGS) ×1 IMPLANT
DURAPREP 26ML APPLICATOR (WOUND CARE) ×2 IMPLANT
ELECT CAUTERY BLADE 6.4 (BLADE) ×2 IMPLANT
ELECT REM PT RETURN 9FT ADLT (ELECTROSURGICAL) ×2
ELECTRODE REM PT RTRN 9FT ADLT (ELECTROSURGICAL) ×1 IMPLANT
FACESHIELD WRAPAROUND (MASK) ×4 IMPLANT
FACESHIELD WRAPAROUND OR TEAM (MASK) ×2 IMPLANT
GAUZE SPONGE 4X4 12PLY STRL (GAUZE/BANDAGES/DRESSINGS) ×2 IMPLANT
GAUZE SPONGE 4X4 12PLY STRL LF (GAUZE/BANDAGES/DRESSINGS) ×1 IMPLANT
GAUZE XEROFORM 1X8 LF (GAUZE/BANDAGES/DRESSINGS) ×4 IMPLANT
GLOVE BIO SURGEON STRL SZ7.5 (GLOVE) ×2 IMPLANT
GLOVE BIOGEL PI IND STRL 8 (GLOVE) ×1 IMPLANT
GLOVE BIOGEL PI INDICATOR 8 (GLOVE) ×1
GOWN STRL REUS W/ TWL LRG LVL3 (GOWN DISPOSABLE) ×2 IMPLANT
GOWN STRL REUS W/ TWL XL LVL3 (GOWN DISPOSABLE) ×1 IMPLANT
GOWN STRL REUS W/TWL LRG LVL3 (GOWN DISPOSABLE) ×4
GOWN STRL REUS W/TWL XL LVL3 (GOWN DISPOSABLE) ×2
GUIDEWIRE 3.2X400 (WIRE) ×1 IMPLANT
KIT BASIN OR (CUSTOM PROCEDURE TRAY) ×2 IMPLANT
MANIFOLD NEPTUNE II (INSTRUMENTS) ×2 IMPLANT
NAIL TIBIAL EX W/BEND 10X345M (Nail) ×1 IMPLANT
NS IRRIG 1000ML POUR BTL (IV SOLUTION) ×2 IMPLANT
PACK TOTAL JOINT (CUSTOM PROCEDURE TRAY) ×2 IMPLANT
PACK UNIVERSAL I (CUSTOM PROCEDURE TRAY) ×2 IMPLANT
PAD ABD 8X10 STRL (GAUZE/BANDAGES/DRESSINGS) ×1 IMPLANT
PAD CAST 4YDX4 CTTN HI CHSV (CAST SUPPLIES) ×2 IMPLANT
PADDING CAST COTTON 4X4 STRL (CAST SUPPLIES) ×4
PADDING CAST COTTON 6X4 STRL (CAST SUPPLIES) ×1 IMPLANT
REAMER ROD DEEP FLUTE 2.5X950 (INSTRUMENTS) ×1 IMPLANT
SCREW LOCK STAR 5X30 (Screw) ×1 IMPLANT
SCREW LOCK STAR 5X36 (Screw) ×1 IMPLANT
SCREW LOCK STAR 5X40 (Screw) ×1 IMPLANT
SCREW LOCK STAR 5X46 (Screw) ×1 IMPLANT
SCREW LOCK STAR 5X56 (Screw) ×2 IMPLANT
STAPLER SKIN PROX WIDE 3.9 (STAPLE) ×2 IMPLANT
SUT MON AB 2-0 CT1 36 (SUTURE) ×2 IMPLANT
TOWEL OR 17X26 10 PK STRL BLUE (TOWEL DISPOSABLE) ×4 IMPLANT
WATER STERILE IRR 1000ML POUR (IV SOLUTION) ×2 IMPLANT

## 2018-04-05 NOTE — H&P (Signed)
H&P update  The surgical history has been reviewed and remains accurate without interval change.  The patient was re-examined and patient's physiologic condition has not changed significantly in the last 30 days. She is now clear for definitive management of the right tibia fracture.  The condition still exists that makes this procedure necessary. The treatment plan remains the same, without new options for care.  No new pharmacological allergies or types of therapy has been initiated that would change the plan or the appropriateness of the plan.  The patient and/or family understand the potential benefits and risks.  Hutton Pellicane P. Stann Mainland, MD 04/05/2018 4:08 PM
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
## 2018-04-05 NOTE — Brief Op Note (Signed)
04/05/2018  6:10 PM  PATIENT:  Samantha [name]  84 y.o. female  PRE-OPERATIVE DIAGNOSIS:  right tibia fx  POST-OPERATIVE DIAGNOSIS:  right tibia fx  PROCEDURE:  Procedure(s): INTRAMEDULLARY (IM) NAIL TIBIAL (Right)  SURGEON:  Surgeon(s) and Role:    * Nicholes Stairs, MD - Primary  PHYSICIAN ASSISTANT:   ASSISTANTS: none   ANESTHESIA:   general  EBL:  60 mL   BLOOD ADMINISTERED:none  DRAINS: none   LOCAL MEDICATIONS USED:  NONE  SPECIMEN:  No Specimen  DISPOSITION OF SPECIMEN:  N/A  COUNTS:  YES  TOURNIQUET:  * No tourniquets in log *  DICTATION: .Note written in EPIC  PLAN OF CARE: Admit to inpatient   PATIENT DISPOSITION:  PACU - hemodynamically stable.   Delay start of Pharmacological VTE agent (>24hrs) due to surgical blood loss or risk of bleeding: not applicable

## 2018-04-05 NOTE — Anesthesia Preprocedure Evaluation (Addendum)
Anesthesia Evaluation  Patient identified by MRN, date of birth, ID band Patient awake    Reviewed: Allergy & Precautions, NPO status , Patient's Chart, lab work & pertinent test results, reviewed documented beta blocker date and time   History of Anesthesia Complications Negative for: history of anesthetic complications  Airway Mallampati: I  TM Distance: >3 FB Neck ROM: Limited    Dental  (+) Edentulous Upper, Edentulous Lower   Pulmonary neg pulmonary ROS,     + decreased breath sounds      Cardiovascular hypertension, Pt. on medications and Pt. on home beta blockers + dysrhythmias Atrial Fibrillation  Rhythm:Regular Rate:Normal     Neuro/Psych Acute stable subdural hematoma negative neurological ROS     GI/Hepatic Neg liver ROS, GERD  Controlled and Medicated,  Endo/Other  Hypothyroidism   Renal/GU negative Renal ROS     Musculoskeletal Right tib/fib fx   Abdominal Normal abdominal exam  (+)   Peds  Hematology negative hematology ROS (+)   Anesthesia Other Findings Day of surgery medications reviewed with the patient.  Reproductive/Obstetrics                            Anesthesia Physical Anesthesia Plan  ASA: III  Anesthesia Plan: General   Post-op Pain Management:    Induction: Intravenous  PONV Risk Score and Plan: 4 or greater and Ondansetron and Treatment may vary due to age or medical condition  Airway Management Planned: Oral ETT and Video Laryngoscope Planned  Additional Equipment: None  Intra-op Plan:   Post-operative Plan: Possible Post-op intubation/ventilation  Informed Consent: I have reviewed the patients History and Physical, chart, labs and discussed the procedure including the risks, benefits and alternatives for the proposed anesthesia with the patient or authorized representative who has indicated his/her understanding and acceptance.   Dental advisory  given  Plan Discussed with: CRNA  Anesthesia Plan Comments:         Anesthesia Quick Evaluation

## 2018-04-05 NOTE — Transfer of Care (Signed)
Immediate Anesthesia Transfer of Care Note  Patient: Samantha [name]  Procedure(s) Performed: INTRAMEDULLARY (IM) NAIL TIBIAL (Right Leg Lower)  Patient Location: PACU  Anesthesia Type:General  Level of Consciousness: awake, alert , oriented and patient cooperative  Airway & Oxygen Therapy: Patient Spontanous Breathing and Patient connected to nasal cannula oxygen  Post-op Assessment: Report given to RN and Post -op Vital signs reviewed and stable  Post vital signs: Reviewed and stable  Last Vitals:  Vitals Value Taken Time  BP 113/59 04/05/2018  6:06 PM  Temp 36.5 C 04/05/2018  6:06 PM  Pulse 80 04/05/2018  6:07 PM  Resp 14 04/05/2018  6:07 PM  SpO2 94 % 04/05/2018  6:07 PM  Vitals shown include unvalidated device data.  Last Pain:  Vitals:   04/05/18 0800  TempSrc: Oral  PainSc:          Complications: No apparent anesthesia complications

## 2018-04-06 ENCOUNTER — Encounter (HOSPITAL_COMMUNITY): Payer: Self-pay | Admitting: Orthopedic Surgery

## 2018-04-06 LAB — CBC
HEMATOCRIT: 24.7 % — AB (ref 36.0–46.0)
HEMOGLOBIN: 7.6 g/dL — AB (ref 12.0–15.0)
MCH: 28.8 pg (ref 26.0–34.0)
MCHC: 30.8 g/dL (ref 30.0–36.0)
MCV: 93.6 fL (ref 80.0–100.0)
Platelets: 150 10*3/uL (ref 150–400)
RBC: 2.64 MIL/uL — AB (ref 3.87–5.11)
RDW: 14.4 % (ref 11.5–15.5)
WBC: 13.6 10*3/uL — ABNORMAL HIGH (ref 4.0–10.5)
nRBC: 0 % (ref 0.0–0.2)

## 2018-04-06 LAB — BASIC METABOLIC PANEL
Anion gap: 5 (ref 5–15)
BUN: 15 mg/dL (ref 8–23)
CHLORIDE: 103 mmol/L (ref 98–111)
CO2: 26 mmol/L (ref 22–32)
Calcium: 8.1 mg/dL — ABNORMAL LOW (ref 8.9–10.3)
Creatinine, Ser: 0.6 mg/dL (ref 0.44–1.00)
GFR calc Af Amer: >60 mL/min (ref >60)
GFR calc non Af Amer: >60 mL/min (ref >60)
Glucose, Bld: 126 mg/dL — ABNORMAL HIGH (ref 70–99)
POTASSIUM: 4.5 mmol/L (ref 3.5–5.1)
SODIUM: 134 mmol/L — AB (ref 135–145)

## 2018-04-06 MED ORDER — ALBUMIN HUMAN 25 % IV SOLN
12.5000 g | Freq: Once | INTRAVENOUS | Status: AC
  Administered 2018-04-06: 12.5 g via INTRAVENOUS
  Filled 2018-04-06: qty 50

## 2018-04-06 NOTE — Anesthesia Postprocedure Evaluation (Signed)
Anesthesia Post Note  Patient: Samantha [name]  Procedure(s) Performed: INTRAMEDULLARY (IM) NAIL TIBIAL (Right Leg Lower)     Patient location during evaluation: PACU Anesthesia Type: General Level of consciousness: awake and alert Pain management: pain level controlled Vital Signs Assessment: post-procedure vital signs reviewed and stable Respiratory status: spontaneous breathing, nonlabored ventilation, respiratory function stable and patient connected to nasal cannula oxygen Cardiovascular status: blood pressure returned to baseline and stable Postop Assessment: no apparent nausea or vomiting Anesthetic complications: no    Last Vitals:  Vitals:   04/06/18 0859 04/06/18 0900  BP:  (!) 110/56  Pulse:  67  Resp:    Temp:    SpO2: 98%     Last Pain:  Vitals:   04/06/18 0828  TempSrc: Oral  PainSc:                  Deziyah Arvin S
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
## 2018-04-06 NOTE — Progress Notes (Signed)
   Subjective:  Patient reports pain as moderate.  Complaining of moderate pain at the knee and ankle.  She is up in the chair this morning and feeling more comfortable out of the bed.  She did have some drainage overnight at the ankle dressings.  Otherwise she has worked with OT and PT.  No complaints of chest pain, shortness of breath, nausea or vomiting.  Objective:   VITALS:   Vitals:   04/06/18 1100 04/06/18 1200 04/06/18 1205 04/06/18 1300  BP: (!) 97/57  (!) 175/109 103/70  Pulse: 63  69 67  Resp: 14   15  Temp:  97.8 F (36.6 C)    TempSrc:  Oral    SpO2: 99% 100% 100% 100%  Weight:      Height:        Neurologically intact Intact pulses distally Dorsiflexion/Plantar flexion intact Incision: moderate drainage Compartment soft Moderate drainage noted at the distal interlock incisions.  These dressings were changed at the bedside.  The incisions themselves are clean dry and intact. Cam boot reapplied.  Lab Results  Component Value Date   WBC 13.6 (H) 04/06/2018   HGB 7.6 (L) 04/06/2018   HCT 24.7 (L) 04/06/2018   MCV 93.6 04/06/2018   PLT 150 04/06/2018   BMET    Component Value Date/Time   NA 134 (L) 04/06/2018 0820   K 4.5 04/06/2018 0820   CL 103 04/06/2018 0820   CO2 26 04/06/2018 0820   GLUCOSE 126 (H) 04/06/2018 0820   BUN 15 04/06/2018 0820   CREATININE 0.60 04/06/2018 0820   CALCIUM 8.1 (L) 04/06/2018 0820   GFRNONAA >60 04/06/2018 0820   GFRAA >60 04/06/2018 0820     Assessment/Plan: 1 Day Post-Op   Active Problems:   SAH (subarachnoid hemorrhage) ([place])   Up with therapy -Okay for 50% weightbearing to the right lower extremity.  She should maintain her cam boot at all times. -PRN dry dressing changes with ABD and Ace wrap.    Nicholes Stairs 04/06/2018, 1:21 PM   Geralynn Rile, MD [phone]

## 2018-04-06 NOTE — Progress Notes (Signed)
Orthopedic Tech Progress Note Patient Details:  Samantha [name] February 23, 1930 [id number]  Ortho Devices Type of Ortho Device: CAM walker Ortho Device/Splint Location: rle Ortho Device/Splint Interventions: Application   Post Interventions Patient Tolerated: Well Instructions Provided: Care of device   Hildred Priest 04/06/2018, 7:57 AM
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
## 2018-04-06 NOTE — Progress Notes (Signed)
RLE moderate serosanguineous drainage over night through ACE wrap & BP soft, MD made aware.  New order for albumin 25% 12.5 g. Will continue to monitor.

## 2018-04-07 LAB — CBC
HCT: 20.6 % — ABNORMAL LOW (ref 36.0–46.0)
HEMOGLOBIN: 6.1 g/dL — AB (ref 12.0–15.0)
MCH: 28.1 pg (ref 26.0–34.0)
MCHC: 29.6 g/dL — AB (ref 30.0–36.0)
MCV: 94.9 fL (ref 80.0–100.0)
Platelets: 153 10*3/uL (ref 150–400)
RBC: 2.17 MIL/uL — AB (ref 3.87–5.11)
RDW: 14.6 % (ref 11.5–15.5)
WBC: 11.1 10*3/uL — AB (ref 4.0–10.5)
nRBC: 0 % (ref 0.0–0.2)

## 2018-04-07 LAB — PREPARE RBC (CROSSMATCH)

## 2018-04-07 LAB — HEMOGLOBIN AND HEMATOCRIT, BLOOD
HEMATOCRIT: 28.5 % — AB (ref 36.0–46.0)
Hemoglobin: 9 g/dL — ABNORMAL LOW (ref 12.0–15.0)

## 2018-04-07 MED ORDER — METOPROLOL SUCCINATE ER 50 MG PO TB24
50.0000 mg | ORAL_TABLET | Freq: Every day | ORAL | Status: DC
  Administered 2018-04-08 – 2018-04-13 (×6): 50 mg via ORAL
  Filled 2018-04-07 (×6): qty 1

## 2018-04-07 MED ORDER — BETHANECHOL CHLORIDE 25 MG PO TABS
25.0000 mg | ORAL_TABLET | Freq: Three times a day (TID) | ORAL | Status: DC
  Administered 2018-04-07 – 2018-04-13 (×19): 25 mg via ORAL
  Filled 2018-04-07 (×3): qty 1
  Filled 2018-04-07 (×3): qty 3
  Filled 2018-04-07 (×2): qty 1
  Filled 2018-04-07 (×2): qty 3
  Filled 2018-04-07: qty 1
  Filled 2018-04-07 (×2): qty 3
  Filled 2018-04-07 (×2): qty 1
  Filled 2018-04-07 (×2): qty 3
  Filled 2018-04-07: qty 1
  Filled 2018-04-07: qty 3

## 2018-04-07 MED ORDER — SODIUM CHLORIDE 0.9% IV SOLUTION: Freq: Once | INTRAVENOUS | Status: DC

## 2018-04-07 MED ORDER — ALBUMIN HUMAN 25 % IV SOLN
50.0000 g | Freq: Once | INTRAVENOUS | Status: AC
  Administered 2018-04-07: 50 g via INTRAVENOUS
  Filled 2018-04-07: qty 200

## 2018-04-07 MED ORDER — MAGNESIUM HYDROXIDE 400 MG/5ML PO SUSP
30.0000 mL | Freq: Every day | ORAL | Status: DC
  Filled 2018-04-07: qty 30
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
## 2018-04-07 NOTE — NC FL2 (Signed)
[place] LEVEL OF CARE SCREENING TOOL     IDENTIFICATION  Patient Name: Samantha [name] Birthdate: 02-08-1930 Sex: female Admission Date (Current Location): 04/02/2018  [facility] and [place] Number:  Herbalist and Address:         Provider Number: [id number]  Attending Physician Name and Address:  Md, Trauma, MD  Relative Name and Phone Number:       Current Level of Care: Hospital Recommended Level of Care: [place] Prior Approval Number:    Date Approved/Denied:   PASRR Number: [id number]  Discharge Plan: SNF    Current Diagnoses: Patient Active Problem List   Diagnosis Date Noted  . SAH (subarachnoid hemorrhage) ([place]) 04/02/2018    Orientation RESPIRATION BLADDER Height & Weight     Self, Time, Situation, Place  O2(3L per nasal cannula) Continent Weight: 144 lb (65.3 kg) Height:  5\' 2"  (157.5 cm)  BEHAVIORAL SYMPTOMS/MOOD NEUROLOGICAL BOWEL NUTRITION STATUS      Continent Diet(soft thin liquids)  AMBULATORY STATUS COMMUNICATION OF NEEDS Skin   Extensive Assist Verbally Other (Comment), Surgical wounds(Rt leg incision; head lacertion with staples,; Lt leg laceration)                       Personal Care Assistance Level of Assistance  Bathing, Feeding, Dressing Bathing Assistance: Limited assistance Feeding assistance: Limited assistance Dressing Assistance: Limited assistance     Functional Limitations Info  Sight, Hearing, Speech Sight Info: Adequate Hearing Info: Adequate Speech Info: Adequate    SPECIAL CARE FACTORS FREQUENCY  PT (By licensed PT), OT (By licensed OT)     PT Frequency: 3-5 times/ week OT Frequency: 3-5 times/week            Contractures Contractures Info: Not present    Additional Factors Info  Allergies, Code Status Code Status Info: Full code Allergies Info: Erythromycin, Neosporin Plus Max St, Penicillins           Current Medications (04/07/2018):  This is the  current hospital active medication list Current Facility-Administered Medications  Medication Dose Route Frequency Provider Last Rate Last Dose  . 0.9 %  sodium chloride infusion (Manually program via Guardrails IV Fluids)   Intravenous Once Saverio Danker, PA-C      . 0.9 %  sodium chloride infusion   Intravenous Continuous Nicholes Stairs, MD 50 mL/hr at 04/07/18 1200    . bethanechol (URECHOLINE) tablet 25 mg  25 mg Oral TID Georganna Skeans, MD   25 mg at 04/07/18 1007  . bisacodyl (DULCOLAX) suppository 10 mg  10 mg Rectal Daily PRN Nicholes Stairs, MD      . Chlorhexidine Gluconate Cloth 2 % PADS 6 each  6 each Topical Daily Nicholes Stairs, MD   6 each at 04/06/18 1221  . docusate sodium (COLACE) capsule 100 mg  100 mg Oral BID Nicholes Stairs, MD      . furosemide (LASIX) tablet 40 mg  40 mg Oral Daily Nicholes Stairs, MD   40 mg at 04/07/18 0954  . lactated ringers infusion   Intravenous Continuous Nicholes Stairs, MD 10 mL/hr at 04/05/18 1609    . levothyroxine (SYNTHROID, LEVOTHROID) tablet 100 mcg  100 mcg Oral Q0600 Nicholes Stairs, MD   100 mcg at 04/07/18 0548  . magnesium hydroxide (MILK OF MAGNESIA) suspension 30 mL  30 mL Oral Daily Georganna Skeans, MD      . MEDLINE mouth rinse  15 mL Mouth Rinse BID Nicholes Stairs, MD   15 mL at 04/06/18 2200  . metoCLOPramide (REGLAN) tablet 5-10 mg  5-10 mg Oral Q8H PRN Nicholes Stairs, MD       Or  . metoCLOPramide [facility]) injection 5-10 mg  5-10 mg Intravenous Q8H PRN Nicholes Stairs, MD      . Derrill Memo ON 04/08/2018] metoprolol succinate (TOPROL-XL) 24 hr tablet 50 mg  50 mg Oral Daily Focht, Caydn Justen L, PA      . metoprolol tartrate (LOPRESSOR) injection 2.5 mg  2.5 mg Intravenous Q5 min PRN Nicholes Stairs, MD      . mometasone-formoterol [facility]) 100-5 MCG/ACT inhaler 2 puff  2 puff Inhalation BID Nicholes Stairs, MD   2 puff at 04/07/18 0854  . montelukast  (SINGULAIR) tablet 10 mg  10 mg Oral QHS Nicholes Stairs, MD   10 mg at 04/06/18 2300  . morphine 2 MG/ML injection 1 mg  1 mg Intravenous Q4H PRN Nicholes Stairs, MD   1 mg at 04/06/18 1843  . ondansetron (ZOFRAN) tablet 4 mg  4 mg Oral Q6H PRN Nicholes Stairs, MD       Or  . ondansetron [facility]) injection 4 mg  4 mg Intravenous Q6H PRN Nicholes Stairs, MD      . pantoprazole (PROTONIX) EC tablet 40 mg  40 mg Oral Daily Nicholes Stairs, MD   40 mg at 04/07/18 0954  . polyethylene glycol (MIRALAX / GLYCOLAX) packet 17 g  17 g Oral Damaris Schooner, MD   17 g at 04/06/18 0939  . sodium chloride flush (NS) 0.9 % injection 10-40 mL  10-40 mL Intracatheter Q12H Nicholes Stairs, MD   10 mL at 04/07/18 0955  . sodium chloride flush (NS) 0.9 % injection 10-40 mL  10-40 mL Intracatheter PRN Nicholes Stairs, MD      . traMADol Veatrice Bourbon) tablet 50 mg  50 mg Oral Q6H PRN Nicholes Stairs, MD   50 mg at 04/07/18 1211     Discharge Medications: Please see discharge summary for a list of discharge medications.  Relevant Imaging Results:  Relevant Lab Results:   Additional Information SSN [id number]    Barbette Or, [place]
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
## 2018-04-07 NOTE — Progress Notes (Signed)
CRITICAL VALUE ALERT  Critical Value:  HGB 6.1  Date & Time Notied:  04/07/18 0645  Provider Notified: Barry Dienes  Orders Received/Actions taken:

## 2018-04-07 NOTE — Progress Notes (Signed)
Md made aware of pts low urine output, orders for albumin and to check bladder volume again 2 hours following infusion.  If pt bladder volume remains <100 or > 263ml, orders to replace indwelling catheter.  WCTM

## 2018-04-08 LAB — BASIC METABOLIC PANEL
ANION GAP: 3 — AB (ref 5–15)
BUN: 19 mg/dL (ref 8–23)
CALCIUM: 8.2 mg/dL — AB (ref 8.9–10.3)
CO2: 28 mmol/L (ref 22–32)
CREATININE: 0.71 mg/dL (ref 0.44–1.00)
Chloride: 104 mmol/L (ref 98–111)
Glucose, Bld: 98 mg/dL (ref 70–99)
Potassium: 3.3 mmol/L — ABNORMAL LOW (ref 3.5–5.1)
SODIUM: 135 mmol/L (ref 135–145)

## 2018-04-08 LAB — TYPE AND SCREEN
ABO/RH(D): O POS
ANTIBODY SCREEN: NEGATIVE
UNIT DIVISION: 0
Unit division: 0

## 2018-04-08 LAB — BPAM RBC
BLOOD PRODUCT EXPIRATION DATE: 201912012359
Blood Product Expiration Date: 201912032359
ISSUE DATE / TIME: 201911061118
ISSUE DATE / TIME: 201911061345
Unit Type and Rh: 5100
Unit Type and Rh: 5100

## 2018-04-08 LAB — CBC
HEMATOCRIT: 28.8 % — AB (ref 36.0–46.0)
Hemoglobin: 9 g/dL — ABNORMAL LOW (ref 12.0–15.0)
MCH: 28 pg (ref 26.0–34.0)
MCHC: 31.3 g/dL (ref 30.0–36.0)
MCV: 89.7 fL (ref 80.0–100.0)
NRBC: 0 % (ref 0.0–0.2)
PLATELETS: 161 10*3/uL (ref 150–400)
RBC: 3.21 MIL/uL — ABNORMAL LOW (ref 3.87–5.11)
RDW: 16.1 % — AB (ref 11.5–15.5)
WBC: 10.7 10*3/uL — ABNORMAL HIGH (ref 4.0–10.5)
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
## 2018-04-08 NOTE — Progress Notes (Signed)
Patient ID: Samantha [name], female   DOB: 12-06-1929, 84 y.o.   MRN: [id number] I called her son Jenny Reichmann and updated him regarding her progress and the plan of care. Georganna Skeans, MD, MPH, FACS Trauma: [phone] General Surgery: [phone]
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
## 2018-04-09 LAB — BASIC METABOLIC PANEL
ANION GAP: 5 (ref 5–15)
BUN: 10 mg/dL (ref 8–23)
CALCIUM: 7.6 mg/dL — AB (ref 8.9–10.3)
CO2: 29 mmol/L (ref 22–32)
Chloride: 101 mmol/L (ref 98–111)
Creatinine, Ser: 0.68 mg/dL (ref 0.44–1.00)
Glucose, Bld: 135 mg/dL — ABNORMAL HIGH (ref 70–99)
Potassium: 3.1 mmol/L — ABNORMAL LOW (ref 3.5–5.1)
Sodium: 135 mmol/L (ref 135–145)

## 2018-04-09 LAB — CBC
HEMATOCRIT: 29.5 % — AB (ref 36.0–46.0)
Hemoglobin: 8.8 g/dL — ABNORMAL LOW (ref 12.0–15.0)
MCH: 27.2 pg (ref 26.0–34.0)
MCHC: 29.8 g/dL — ABNORMAL LOW (ref 30.0–36.0)
MCV: 91 fL (ref 80.0–100.0)
NRBC: 0 % (ref 0.0–0.2)
PLATELETS: 182 10*3/uL (ref 150–400)
RBC: 3.24 MIL/uL — AB (ref 3.87–5.11)
RDW: 15.8 % — AB (ref 11.5–15.5)
WBC: 11.6 10*3/uL — AB (ref 4.0–10.5)

## 2018-04-09 MED ORDER — DRONEDARONE HCL 400 MG PO TABS
400.0000 mg | ORAL_TABLET | Freq: Two times a day (BID) | ORAL | Status: DC
  Administered 2018-04-09 – 2018-04-13 (×7): 400 mg via ORAL
  Filled 2018-04-09 (×10): qty 1

## 2018-04-09 MED ORDER — ENOXAPARIN SODIUM 40 MG/0.4ML ~~LOC~~ SOLN
40.0000 mg | SUBCUTANEOUS | Status: DC
  Administered 2018-04-09 – 2018-04-11 (×3): 40 mg via SUBCUTANEOUS
  Filled 2018-04-09 (×3): qty 0.4
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
## 2018-04-09 NOTE — Progress Notes (Signed)
   Subjective:  Patient reports pain as moderate.   She is up in the chair this morning and feeling more comfortable out of the bed. She feels very weak.  No complaints of chest pain, shortness of breath, nausea or vomiting.  Objective:   VITALS:   Vitals:   04/09/18 1100 04/09/18 1200 04/09/18 1205 04/09/18 1300  BP: (!) 127/52 121/65  (!) 125/53  Pulse: 77 85  79  Resp: (!) 22 14  19   Temp:   98.2 F (36.8 C)   TempSrc:   Oral   SpO2: 94% 94%  92%  Weight:      Height:        Neurologically intact Intact pulses distally Dorsiflexion/Plantar flexion intact Incision: moderate drainage Compartment soft dressins are clean dry and intact. Cam boot reapplied.  Lab Results  Component Value Date   WBC 11.6 (H) 04/09/2018   HGB 8.8 (L) 04/09/2018   HCT 29.5 (L) 04/09/2018   MCV 91.0 04/09/2018   PLT 182 04/09/2018   BMET    Component Value Date/Time   NA 135 04/08/2018 0500   K 3.3 (L) 04/08/2018 0500   CL 104 04/08/2018 0500   CO2 28 04/08/2018 0500   GLUCOSE 98 04/08/2018 0500   BUN 19 04/08/2018 0500   CREATININE 0.71 04/08/2018 0500   CALCIUM 8.2 (L) 04/08/2018 0500   GFRNONAA >60 04/08/2018 0500   GFRAA >60 04/08/2018 0500     Assessment/Plan: 4 Days Post-Op   Active Problems:   SAH (subarachnoid hemorrhage) ([place])   Up with therapy -Okay for 50% weightbearing to the right lower extremity.  She should maintain her cam boot at all times. -PRN dry dressing changes with ABD and Ace wrap. - ok for chemical and mechanical DVT ppx per primary team     Nicholes Stairs 04/09/2018, 3:20 PM   Geralynn Rile, MD [phone]
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
## 2018-04-09 NOTE — Progress Notes (Signed)
PT is back in A Fib/RVR.  Trauma MD notified of changes and will consult Cards. New orders received.  RN will continue to monitor.
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
## 2018-04-09 NOTE — Clinical Social Work Note (Signed)
Clinical Social Worker continuing to follow patient and family for support and discharge planning needs.  Patient provided with offers and has chosen [facility].  CSW has communicated with facility who will initiate insurance authorization with [employer].  CSW available for support and to facilitate patient discharge needs once medically stable.  Barbette Or, [place]

## 2018-04-09 NOTE — Progress Notes (Signed)
IV RN consulted to patient room to assess PICC line in left arm. PICC line infusing  and good blood return per patient RN. PICC insertion site with ecchymosis and LAFA swollen and red.  Skin warm to touch, patient denies pain/tenderness, brisk capillary refill.  Patient RAFA reddened only, warm to touch, brisk capillary refill.  IV RN encouraged patient and patient RN to elevate arm on pillow, avoid dependent positioning of arm, and to continue to use arm as patient normally would and notify IV/VAST team for any further changes in arm size and presentation.  Patient and patient RN verbalized understanding.  Patient RN to continue to monitor patient.
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
## 2018-04-10 LAB — CBC
HCT: 28.1 % — ABNORMAL LOW (ref 36.0–46.0)
HEMOGLOBIN: 8.4 g/dL — AB (ref 12.0–15.0)
MCH: 27.4 pg (ref 26.0–34.0)
MCHC: 29.9 g/dL — AB (ref 30.0–36.0)
MCV: 91.5 fL (ref 80.0–100.0)
NRBC: 0 % (ref 0.0–0.2)
Platelets: 197 10*3/uL (ref 150–400)
RBC: 3.07 MIL/uL — ABNORMAL LOW (ref 3.87–5.11)
RDW: 15.8 % — AB (ref 11.5–15.5)
WBC: 10.5 10*3/uL (ref 4.0–10.5)

## 2018-04-10 MED ORDER — SODIUM CHLORIDE 0.9% FLUSH
3.0000 mL | Freq: Two times a day (BID) | INTRAVENOUS | Status: DC
  Administered 2018-04-10 – 2018-04-12 (×4): 3 mL via INTRAVENOUS

## 2018-04-10 MED ORDER — SODIUM CHLORIDE 0.9% FLUSH
3.0000 mL | INTRAVENOUS | Status: DC | PRN
  Administered 2018-04-10: 3 mL via INTRAVENOUS
  Filled 2018-04-10: qty 3

## 2018-04-10 MED ORDER — SODIUM CHLORIDE 0.9 % IV SOLN: 250.0000 mL | INTRAVENOUS | Status: DC | PRN
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
## 2018-04-10 NOTE — Progress Notes (Signed)
Called to D/C Picc per MD order. LUA PICC site bruised, red, edematous from PICC to lower forearm. Good radial pulse, skin warm/dry, MD aware Left arm red/swollen. Elevated Left arm on pillow. Continue to monitor

## 2018-04-10 NOTE — Plan of Care (Signed)
  Problem: Education: Goal: Knowledge of General Education information will improve Description Including pain rating scale, medication(s)/side effects and non-pharmacologic comfort measures Outcome: Progressing   Problem: Health Behavior/Discharge Planning: Goal: Ability to manage health-related needs will improve Outcome: Progressing   Problem: Clinical Measurements: Goal: Ability to maintain clinical measurements within normal limits will improve Outcome: Progressing Goal: Will remain free from infection Outcome: Progressing Goal: Diagnostic test results will improve Outcome: Progressing Goal: Respiratory complications will improve Outcome: Progressing Goal: Cardiovascular complication will be avoided Outcome: Progressing   Problem: Activity: Goal: Risk for activity intolerance will decrease Outcome: Progressing   Problem: Nutrition: Goal: Adequate nutrition will be maintained Outcome: Progressing   Problem: Coping: Goal: Level of anxiety will decrease Outcome: Progressing   Problem: Elimination: Goal: Will not experience complications related to bowel motility Outcome: Progressing Goal: Will not experience complications related to urinary retention Outcome: Progressing   Problem: Pain Managment: Goal: General experience of comfort will improve Outcome: Progressing   Problem: Safety: Goal: Ability to remain free from injury will improve Outcome: Progressing   Problem: Skin Integrity: Goal: Risk for impaired skin integrity will decrease Outcome: Progressing   Forestine Chute, RN 04/10/2018

## 2018-04-10 NOTE — Progress Notes (Signed)
Patient's left arm is red and swollen. Pulses palpable, capillary refill is less than 3 seconds. No heat felt on redness. Patient states there is no change in sensation. Patient is able to move arm and hand. Patient notes little or no pain with this arm. Will continue to monitor.
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
## 2018-04-11 ENCOUNTER — Inpatient Hospital Stay (HOSPITAL_COMMUNITY): Payer: Medicare HMO

## 2018-04-11 DIAGNOSIS — M7989 Other specified soft tissue disorders: Secondary | ICD-10-CM

## 2018-04-11 DIAGNOSIS — R52 Pain, unspecified: Secondary | ICD-10-CM

## 2018-04-11 MED ORDER — HEPARIN (PORCINE) 25000 UT/250ML-% IV SOLN
750.0000 [IU]/h | INTRAVENOUS | Status: DC
  Administered 2018-04-11: 750 [IU]/h via INTRAVENOUS
  Filled 2018-04-11: qty 250

## 2018-04-11 MED ORDER — POTASSIUM CHLORIDE CRYS ER 20 MEQ PO TBCR
20.0000 meq | EXTENDED_RELEASE_TABLET | Freq: Two times a day (BID) | ORAL | Status: DC
  Administered 2018-04-11 – 2018-04-13 (×5): 20 meq via ORAL
  Filled 2018-04-11 (×5): qty 1

## 2018-04-11 MED ORDER — POLYETHYLENE GLYCOL 3350 17 G PO PACK
17.0000 g | PACK | Freq: Every day | ORAL | Status: DC
  Administered 2018-04-12 – 2018-04-13 (×2): 17 g via ORAL
  Filled 2018-04-11 (×2): qty 1
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
## 2018-04-11 NOTE — Progress Notes (Signed)
VASCULAR LAB PRELIMINARY  PRELIMINARY  PRELIMINARY  PRELIMINARY  Left upper extremity venous duplex completed.    Preliminary report:  There is acute DVT noted in the left axillary vein and acute superficial thrombosis noted in the left basilic vein.  Samantha [name], RVT 04/11/2018, 4:57 PM
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
## 2018-04-11 NOTE — Plan of Care (Signed)
  Problem: Education: Goal: Knowledge of General Education information will improve Description Including pain rating scale, medication(s)/side effects and non-pharmacologic comfort measures Outcome: Progressing   Problem: Clinical Measurements: Goal: Ability to maintain clinical measurements within normal limits will improve Outcome: Progressing Goal: Respiratory complications will improve Outcome: Progressing   Problem: Nutrition: Goal: Adequate nutrition will be maintained Outcome: Progressing   Problem: Elimination: Goal: Will not experience complications related to bowel motility Outcome: Progressing   Problem: Pain Managment: Goal: General experience of comfort will improve Outcome: Progressing   Problem: Safety: Goal: Ability to remain free from injury will improve Outcome: Progressing   Problem: Skin Integrity: Goal: Risk for impaired skin integrity will decrease Outcome: Progressing

## 2018-04-11 NOTE — Progress Notes (Signed)
Subjective: 6 Days Post-Op Procedure(s) (LRB): INTRAMEDULLARY (IM) NAIL TIBIAL (Right) Patient reports pain as 3 on 0-10 scale.  No major changes today. Circulation intact.HBg8.4 and stable.  Objective: Vital signs in last 24 hours: Temp:  [97.7 F (36.5 C)-99.3 F (37.4 C)] 98 F (36.7 C) (11/10 0534) Pulse Rate:  [63-76] 70 (11/10 0534) Resp:  [15-18] 16 (11/10 0534) BP: (113-126)/(48-67) 119/52 (11/10 0534) SpO2:  [90 %-97 %] 95 % (11/10 0534)  Intake/Output from previous day: 11/09 0701 - 11/10 0700 In: 50.1 [I.V.:50.1] Out: 3335 [Urine:3335] Intake/Output this shift: No intake/output data recorded.  Recent Labs    04/09/18 0500 04/10/18 0610  HGB 8.8* 8.4*   Recent Labs    04/09/18 0500 04/10/18 0610  WBC 11.6* 10.5  RBC 3.24* 3.07*  HCT 29.5* 28.1*  PLT 182 197   Recent Labs    04/09/18 1000  NA 135  K 3.1*  CL 101  CO2 29  BUN 10  CREATININE 0.68  GLUCOSE 135*  CALCIUM 7.6*   No results for input(s): LABPT, INR in the last 72 hours.  No major changes in her status  She is in no distress this morning. She ia alert and oriented.  Assessment/Plan: 6 Days Post-Op Procedure(s) (LRB): INTRAMEDULLARY (IM) NAIL TIBIAL (Right) Up with therapy,when able.    Latanya Maudlin 04/11/2018, 8:24 AM

## 2018-04-11 NOTE — Sepsis Progress Note (Signed)
Notified MD about ultrasound result.

## 2018-04-12 LAB — BASIC METABOLIC PANEL
ANION GAP: 7 (ref 5–15)
BUN: 11 mg/dL (ref 8–23)
CO2: 27 mmol/L (ref 22–32)
Calcium: 8.5 mg/dL — ABNORMAL LOW (ref 8.9–10.3)
Chloride: 102 mmol/L (ref 98–111)
Creatinine, Ser: 0.73 mg/dL (ref 0.44–1.00)
GFR calc Af Amer: >60 mL/min (ref >60)
GLUCOSE: 95 mg/dL (ref 70–99)
POTASSIUM: 4 mmol/L (ref 3.5–5.1)
Sodium: 136 mmol/L (ref 135–145)

## 2018-04-12 LAB — CBC
HCT: 30.7 % — ABNORMAL LOW (ref 36.0–46.0)
HEMOGLOBIN: 9.7 g/dL — AB (ref 12.0–15.0)
MCH: 28.1 pg (ref 26.0–34.0)
MCHC: 31.6 g/dL (ref 30.0–36.0)
MCV: 89 fL (ref 80.0–100.0)
Platelets: 289 10*3/uL (ref 150–400)
RBC: 3.45 MIL/uL — AB (ref 3.87–5.11)
RDW: 15.9 % — ABNORMAL HIGH (ref 11.5–15.5)
WBC: 10.5 10*3/uL (ref 4.0–10.5)
nRBC: 0 % (ref 0.0–0.2)

## 2018-04-12 LAB — HEPARIN LEVEL (UNFRACTIONATED)
Heparin Unfractionated: 0.38 IU/mL (ref 0.30–0.70)
Heparin Unfractionated: 0.25 [IU]/mL — ABNORMAL LOW (ref 0.30–0.70)

## 2018-04-12 MED ORDER — TAMSULOSIN HCL 0.4 MG PO CAPS
0.4000 mg | ORAL_CAPSULE | Freq: Every day | ORAL | Status: DC
  Administered 2018-04-12 – 2018-04-13 (×2): 0.4 mg via ORAL
  Filled 2018-04-12 (×2): qty 1

## 2018-04-12 MED ORDER — HEPARIN (PORCINE) 25000 UT/250ML-% IV SOLN
750.0000 [IU]/h | INTRAVENOUS | Status: AC
  Filled 2018-04-12: qty 250

## 2018-04-12 MED ORDER — APIXABAN 5 MG PO TABS
5.0000 mg | ORAL_TABLET | Freq: Two times a day (BID) | ORAL | Status: DC
  Administered 2018-04-12 (×2): 5 mg via ORAL
  Filled 2018-04-12 (×2): qty 1
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
## 2018-04-12 NOTE — Consult Note (Signed)
[facility] Nurse wound consult note "Evaluate redness and swelling of left upper extremity" is the reason for the [facility] consult.  The patient had a PICC line and developed a DVT.  WOC consult d/cd. Val Riles, RN, MSN, CWOCN, CNS-BC, pager [phone]
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
## 2018-04-12 NOTE — Discharge Instructions (Addendum)
WBAT to LLE 50% WB to RLE Eliquis for LUE DVT and A fib - Take 10 mg twice a day for 7 days, then switch back to normal dose of 5mg  twice a day May shower Please continue foley catheter for at least a week, then try voiding trial again.  If fails will need urology follow up   Information on my medicine - ELIQUIS (apixaban)  Why was Eliquis prescribed for you? Eliquis was prescribed for you to reduce the risk of a blood clot forming that can cause a stroke if you have a medical condition called atrial fibrillation (a type of irregular heartbeat).  What do You need to know about Eliquis ? Take your Eliquis TWICE DAILY - one tablet in the morning and one tablet in the evening with or without food. If you have difficulty swallowing the tablet whole please discuss with your pharmacist how to take the medication safely.  Take Eliquis exactly as prescribed by your doctor and DO NOT stop taking Eliquis without talking to the doctor who prescribed the medication.  Stopping may increase your risk of developing a stroke.  Refill your prescription before you run out.  After discharge, you should have regular check-up appointments with your healthcare provider that is prescribing your Eliquis.  In the future your dose may need to be changed if your kidney function or weight changes by a significant amount or as you get older.  What do you do if you miss a dose? If you miss a dose, take it as soon as you remember on the same day and resume taking twice daily.  Do not take more than one dose of ELIQUIS at the same time to make up a missed dose.  Important Safety Information A possible side effect of Eliquis is bleeding. You should call your healthcare provider right away if you experience any of the following: ? Bleeding from an injury or your nose that does not stop. ? Unusual colored urine (red or dark brown) or unusual colored stools (red or black). ? Unusual bruising for unknown reasons. ? A  serious fall or if you hit your head (even if there is no bleeding).  Some medicines may interact with Eliquis and might increase your risk of bleeding or clotting while on Eliquis. To help avoid this, consult your healthcare provider or pharmacist prior to using any new prescription or non-prescription medications, including herbals, vitamins, non-steroidal anti-inflammatory drugs (NSAIDs) and supplements.  This website has more information on Eliquis (apixaban): http://www.eliquis.com/eliquis/home
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
## 2018-04-13 DIAGNOSIS — S81812D Laceration without foreign body, left lower leg, subsequent encounter: Secondary | ICD-10-CM | POA: Diagnosis not present

## 2018-04-13 DIAGNOSIS — R600 Localized edema: Secondary | ICD-10-CM | POA: Diagnosis not present

## 2018-04-13 DIAGNOSIS — L03116 Cellulitis of left lower limb: Secondary | ICD-10-CM | POA: Diagnosis not present

## 2018-04-13 DIAGNOSIS — M2550 Pain in unspecified joint: Secondary | ICD-10-CM | POA: Diagnosis not present

## 2018-04-13 DIAGNOSIS — S32502D Unspecified fracture of left pubis, subsequent encounter for fracture with routine healing: Secondary | ICD-10-CM | POA: Diagnosis not present

## 2018-04-13 DIAGNOSIS — D62 Acute posthemorrhagic anemia: Secondary | ICD-10-CM | POA: Diagnosis not present

## 2018-04-13 DIAGNOSIS — I4891 Unspecified atrial fibrillation: Secondary | ICD-10-CM | POA: Diagnosis not present

## 2018-04-13 DIAGNOSIS — H9190 Unspecified hearing loss, unspecified ear: Secondary | ICD-10-CM | POA: Diagnosis not present

## 2018-04-13 DIAGNOSIS — R41841 Cognitive communication deficit: Secondary | ICD-10-CM | POA: Diagnosis not present

## 2018-04-13 DIAGNOSIS — Z79899 Other long term (current) drug therapy: Secondary | ICD-10-CM | POA: Diagnosis not present

## 2018-04-13 DIAGNOSIS — I480 Paroxysmal atrial fibrillation: Secondary | ICD-10-CM | POA: Diagnosis not present

## 2018-04-13 DIAGNOSIS — N390 Urinary tract infection, site not specified: Secondary | ICD-10-CM | POA: Diagnosis not present

## 2018-04-13 DIAGNOSIS — S32599A Other specified fracture of unspecified pubis, initial encounter for closed fracture: Secondary | ICD-10-CM | POA: Diagnosis not present

## 2018-04-13 DIAGNOSIS — S32502A Unspecified fracture of left pubis, initial encounter for closed fracture: Secondary | ICD-10-CM | POA: Diagnosis not present

## 2018-04-13 DIAGNOSIS — Z741 Need for assistance with personal care: Secondary | ICD-10-CM | POA: Diagnosis not present

## 2018-04-13 DIAGNOSIS — I609 Nontraumatic subarachnoid hemorrhage, unspecified: Secondary | ICD-10-CM | POA: Diagnosis not present

## 2018-04-13 DIAGNOSIS — S069X9D Unspecified intracranial injury with loss of consciousness of unspecified duration, subsequent encounter: Secondary | ICD-10-CM | POA: Diagnosis not present

## 2018-04-13 DIAGNOSIS — L03115 Cellulitis of right lower limb: Secondary | ICD-10-CM | POA: Diagnosis not present

## 2018-04-13 DIAGNOSIS — M79604 Pain in right leg: Secondary | ICD-10-CM | POA: Diagnosis not present

## 2018-04-13 DIAGNOSIS — J45909 Unspecified asthma, uncomplicated: Secondary | ICD-10-CM | POA: Diagnosis not present

## 2018-04-13 DIAGNOSIS — R1312 Dysphagia, oropharyngeal phase: Secondary | ICD-10-CM | POA: Diagnosis not present

## 2018-04-13 DIAGNOSIS — S0101XD Laceration without foreign body of scalp, subsequent encounter: Secondary | ICD-10-CM | POA: Diagnosis not present

## 2018-04-13 DIAGNOSIS — S06899A Other specified intracranial injury with loss of consciousness of unspecified duration, initial encounter: Secondary | ICD-10-CM | POA: Diagnosis not present

## 2018-04-13 DIAGNOSIS — M25562 Pain in left knee: Secondary | ICD-10-CM | POA: Diagnosis not present

## 2018-04-13 DIAGNOSIS — M6281 Muscle weakness (generalized): Secondary | ICD-10-CM | POA: Diagnosis not present

## 2018-04-13 DIAGNOSIS — I509 Heart failure, unspecified: Secondary | ICD-10-CM | POA: Diagnosis not present

## 2018-04-13 DIAGNOSIS — R339 Retention of urine, unspecified: Secondary | ICD-10-CM | POA: Diagnosis not present

## 2018-04-13 DIAGNOSIS — R300 Dysuria: Secondary | ICD-10-CM | POA: Diagnosis not present

## 2018-04-13 DIAGNOSIS — S81819D Laceration without foreign body, unspecified lower leg, subsequent encounter: Secondary | ICD-10-CM | POA: Diagnosis not present

## 2018-04-13 DIAGNOSIS — Z7689 Persons encountering health services in other specified circumstances: Secondary | ICD-10-CM | POA: Diagnosis not present

## 2018-04-13 DIAGNOSIS — M96671 Fracture of tibia or fibula following insertion of orthopedic implant, joint prosthesis, or bone plate, right leg: Secondary | ICD-10-CM | POA: Diagnosis not present

## 2018-04-13 DIAGNOSIS — E039 Hypothyroidism, unspecified: Secondary | ICD-10-CM | POA: Diagnosis not present

## 2018-04-13 DIAGNOSIS — D72829 Elevated white blood cell count, unspecified: Secondary | ICD-10-CM | POA: Diagnosis not present

## 2018-04-13 DIAGNOSIS — M25561 Pain in right knee: Secondary | ICD-10-CM | POA: Diagnosis not present

## 2018-04-13 DIAGNOSIS — R42 Dizziness and giddiness: Secondary | ICD-10-CM | POA: Diagnosis not present

## 2018-04-13 DIAGNOSIS — M810 Age-related osteoporosis without current pathological fracture: Secondary | ICD-10-CM | POA: Diagnosis not present

## 2018-04-13 DIAGNOSIS — S069X9A Unspecified intracranial injury with loss of consciousness of unspecified duration, initial encounter: Secondary | ICD-10-CM | POA: Diagnosis not present

## 2018-04-13 DIAGNOSIS — Z7401 Bed confinement status: Secondary | ICD-10-CM | POA: Diagnosis not present

## 2018-04-13 DIAGNOSIS — R52 Pain, unspecified: Secondary | ICD-10-CM | POA: Diagnosis not present

## 2018-04-13 DIAGNOSIS — G3184 Mild cognitive impairment, so stated: Secondary | ICD-10-CM | POA: Diagnosis not present

## 2018-04-13 DIAGNOSIS — S0181XD Laceration without foreign body of other part of head, subsequent encounter: Secondary | ICD-10-CM | POA: Diagnosis not present

## 2018-04-13 DIAGNOSIS — S066X0D Traumatic subarachnoid hemorrhage without loss of consciousness, subsequent encounter: Secondary | ICD-10-CM | POA: Diagnosis not present

## 2018-04-13 DIAGNOSIS — S82201D Unspecified fracture of shaft of right tibia, subsequent encounter for closed fracture with routine healing: Secondary | ICD-10-CM | POA: Diagnosis not present

## 2018-04-13 DIAGNOSIS — R2689 Other abnormalities of gait and mobility: Secondary | ICD-10-CM | POA: Diagnosis not present

## 2018-04-13 LAB — HEPARIN LEVEL (UNFRACTIONATED): HEPARIN UNFRACTIONATED: 1.54 [IU]/mL — AB (ref 0.30–0.70)

## 2018-04-13 MED ORDER — APIXABAN 5 MG PO TABS: 5.0000 mg | ORAL_TABLET | Freq: Two times a day (BID) | ORAL | Status: DC

## 2018-04-13 MED ORDER — APIXABAN 5 MG PO TABS
10.0000 mg | ORAL_TABLET | Freq: Two times a day (BID) | ORAL | Status: DC
  Administered 2018-04-13: 10 mg via ORAL
  Filled 2018-04-13: qty 2

## 2018-04-13 MED ORDER — DOCUSATE SODIUM 100 MG PO CAPS: 100.0000 mg | ORAL_CAPSULE | Freq: Two times a day (BID) | ORAL | 0 refills | Status: DC

## 2018-04-13 MED ORDER — APIXABAN 5 MG PO TABS: 10.0000 mg | ORAL_TABLET | Freq: Two times a day (BID) | ORAL | 0 refills | Status: DC

## 2018-04-13 MED ORDER — TRAMADOL HCL 50 MG PO TABS: 50.0000 mg | ORAL_TABLET | Freq: Four times a day (QID) | ORAL | 0 refills | Status: DC | PRN

## 2018-04-13 MED ORDER — TAMSULOSIN HCL 0.4 MG PO CAPS: 0.4000 mg | ORAL_CAPSULE | Freq: Every day | ORAL | Status: DC

## 2018-04-13 NOTE — Clinical Social Work Placement (Signed)
   CLINICAL SOCIAL WORK PLACEMENT  NOTE  Date:  04/13/2018  Patient Details  Name: Samantha [name] MRN: [id number] Date of Birth: 05-25-1930  Clinical Social Work is seeking post-discharge placement for this patient at the [facility] level of care (*CSW will initial, date and re-position this form in  chart as items are completed):  Yes   Patient/family provided with [facility] Work Department's list of facilities offering this level of care within the geographic area requested by the patient (or if unable, by the patient's family).  Yes   Patient/family informed of their freedom to choose among providers that offer the needed level of care, that participate in Medicare, Medicaid or managed care program needed by the patient, have an available bed and are willing to accept the patient.  Yes   Patient/family informed of [facility]'s ownership interest in [facility] and [facility], as well as of the fact that they are under no obligation to receive care at these facilities.  PASRR submitted to EDS on 04/07/18     PASRR number received on 04/07/18     Existing PASRR number confirmed on       FL2 transmitted to all facilities in geographic area requested by pt/family on 04/07/18     FL2 transmitted to all facilities within larger geographic area on       Patient informed that his/her managed care company has contracts with or will negotiate with certain facilities, including the following:        Yes   Patient/family informed of bed offers received.  Patient chooses bed at [facility]     Physician recommends and patient chooses bed at      Patient to be transferred to [facility] on 04/13/18.  Patient to be transferred to facility by Ambulance     Patient family notified on 04/13/18 of transfer.  Name of family member notified:  Patient granddaughter     PHYSICIAN Please prepare priority discharge summary, including medications       Additional Comment:   Barbette Or, [place]

## 2018-04-13 NOTE — Clinical Social Work Note (Signed)
Clinical Social Worker facilitated patient discharge including contacting patient family and facility to confirm patient discharge plans.  Clinical information faxed to facility and family agreeable with plan.  CSW arranged ambulance transport via [facility] to [facility].  RN to call report prior to discharge.  Clinical Social Worker will sign off for now as social work intervention is no longer needed. Please consult us again if new need arises.  Jesse Ernisha Sorn, LCSW [phone] 
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
## 2018-04-13 NOTE — Progress Notes (Signed)
ANTICOAGULATION CONSULT NOTE - Initial Consult  Pharmacy Consult for Eliquis Indication: new DVT + h/o afib/CVA  Allergies  Allergen Reactions  . Erythromycin Rash  . Neosporin Plus Max St Rash  . Penicillins Rash    Patient Measurements: Height: 5\' 2"  (157.5 cm) Weight: 144 lb (65.3 kg) IBW/kg (Calculated) : 50.1  Vital Signs: Temp: 98.8 F (37.1 C) (11/12 0446) Temp Source: Oral (11/12 0446) BP: 98/77 (11/12 0446) Pulse Rate: 68 (11/12 0446)  Labs: Recent Labs    04/12/18 0513 04/12/18 1212 04/13/18 0119  HGB 9.7*  --   --   HCT 30.7*  --   --   PLT 289  --   --   HEPARINUNFRC 0.38 0.25* 1.54*  CREATININE 0.73  --   --     Estimated Creatinine Clearance: 44.8 mL/min (by C-G formula based on SCr of 0.73 mg/dL).   Medical History: History reviewed. No pertinent past medical history.  Assessment:  Anticoag: Eliquis PTA for afib/CVA (last dose 11/1) + LUE DVT from PICC. S/p KCentra on 11/1 and fixation on 11/4. Resumed Eliquis 11/11 at PTA dose. Wt>60kg, age>80, scr<1.5 - 11/10: +DVT LUE - 11/11: Hgb 9.7 improved  Goal of Therapy:  Therapeutic oral anticoagulation Monitor platelets by anticoagulation protocol: Yes   Plan:  Messaged Saverio Danker who ok'd increase to correct dose Increase Eliquis to 10mg  BID x 7d then 5mg  BID for DVT treatment  Everlie Eble S. Alford Highland, PharmD, [place] Clinical Staff Pharmacist [phone] Eilene Ghazi Stillinger 04/13/2018,7:46 AM

## 2018-04-13 NOTE — Progress Notes (Signed)
Discharge instructions (AVS) printed and placed in discharge packet for [facility].  Pt discharged in stable condition via [facility] to [facility]. Report called to Dionisio Paschal, LPN at [facility].  Eliezer [place]

## 2018-04-14 ENCOUNTER — Encounter (INDEPENDENT_AMBULATORY_CARE_PROVIDER_SITE_OTHER): Payer: Self-pay | Admitting: Orthopaedic Surgery

## 2018-04-15 DIAGNOSIS — R2689 Other abnormalities of gait and mobility: Secondary | ICD-10-CM | POA: Diagnosis not present

## 2018-04-15 DIAGNOSIS — M79604 Pain in right leg: Secondary | ICD-10-CM | POA: Diagnosis not present

## 2018-04-15 DIAGNOSIS — R339 Retention of urine, unspecified: Secondary | ICD-10-CM | POA: Diagnosis not present

## 2018-04-15 DIAGNOSIS — S069X9A Unspecified intracranial injury with loss of consciousness of unspecified duration, initial encounter: Secondary | ICD-10-CM | POA: Diagnosis not present

## 2018-04-15 DIAGNOSIS — I609 Nontraumatic subarachnoid hemorrhage, unspecified: Secondary | ICD-10-CM | POA: Diagnosis not present

## 2018-04-15 DIAGNOSIS — S32599A Other specified fracture of unspecified pubis, initial encounter for closed fracture: Secondary | ICD-10-CM | POA: Diagnosis not present

## 2018-04-15 DIAGNOSIS — G3184 Mild cognitive impairment, so stated: Secondary | ICD-10-CM | POA: Diagnosis not present

## 2018-04-19 DIAGNOSIS — R2689 Other abnormalities of gait and mobility: Secondary | ICD-10-CM | POA: Diagnosis not present

## 2018-04-19 DIAGNOSIS — M25561 Pain in right knee: Secondary | ICD-10-CM | POA: Diagnosis not present

## 2018-04-19 DIAGNOSIS — G3184 Mild cognitive impairment, so stated: Secondary | ICD-10-CM | POA: Diagnosis not present

## 2018-04-19 DIAGNOSIS — M25562 Pain in left knee: Secondary | ICD-10-CM | POA: Diagnosis not present

## 2018-04-20 DIAGNOSIS — S32599A Other specified fracture of unspecified pubis, initial encounter for closed fracture: Secondary | ICD-10-CM | POA: Diagnosis not present

## 2018-04-20 DIAGNOSIS — S069X9A Unspecified intracranial injury with loss of consciousness of unspecified duration, initial encounter: Secondary | ICD-10-CM | POA: Diagnosis not present

## 2018-04-20 DIAGNOSIS — D72829 Elevated white blood cell count, unspecified: Secondary | ICD-10-CM | POA: Diagnosis not present

## 2018-04-20 DIAGNOSIS — S81819D Laceration without foreign body, unspecified lower leg, subsequent encounter: Secondary | ICD-10-CM | POA: Diagnosis not present

## 2018-04-21 DIAGNOSIS — I480 Paroxysmal atrial fibrillation: Secondary | ICD-10-CM | POA: Diagnosis not present

## 2018-04-21 DIAGNOSIS — E039 Hypothyroidism, unspecified: Secondary | ICD-10-CM | POA: Diagnosis not present

## 2018-04-21 DIAGNOSIS — I509 Heart failure, unspecified: Secondary | ICD-10-CM | POA: Diagnosis not present

## 2018-04-21 DIAGNOSIS — M810 Age-related osteoporosis without current pathological fracture: Secondary | ICD-10-CM | POA: Diagnosis not present

## 2018-04-23 DIAGNOSIS — S32599A Other specified fracture of unspecified pubis, initial encounter for closed fracture: Secondary | ICD-10-CM | POA: Diagnosis not present

## 2018-04-23 DIAGNOSIS — R339 Retention of urine, unspecified: Secondary | ICD-10-CM | POA: Diagnosis not present

## 2018-04-23 DIAGNOSIS — N390 Urinary tract infection, site not specified: Secondary | ICD-10-CM | POA: Diagnosis not present

## 2018-04-23 DIAGNOSIS — R300 Dysuria: Secondary | ICD-10-CM | POA: Diagnosis not present

## 2018-04-23 DIAGNOSIS — Z5189 Encounter for other specified aftercare: Secondary | ICD-10-CM | POA: Insufficient documentation

## 2018-04-26 DIAGNOSIS — S32599A Other specified fracture of unspecified pubis, initial encounter for closed fracture: Secondary | ICD-10-CM | POA: Diagnosis not present

## 2018-04-26 DIAGNOSIS — S81819D Laceration without foreign body, unspecified lower leg, subsequent encounter: Secondary | ICD-10-CM | POA: Diagnosis not present

## 2018-04-26 DIAGNOSIS — Z79899 Other long term (current) drug therapy: Secondary | ICD-10-CM | POA: Diagnosis not present

## 2018-04-26 DIAGNOSIS — J45909 Unspecified asthma, uncomplicated: Secondary | ICD-10-CM | POA: Diagnosis not present

## 2018-04-27 DIAGNOSIS — S81812D Laceration without foreign body, left lower leg, subsequent encounter: Secondary | ICD-10-CM | POA: Diagnosis not present

## 2018-04-27 DIAGNOSIS — S0101XD Laceration without foreign body of scalp, subsequent encounter: Secondary | ICD-10-CM | POA: Diagnosis not present

## 2018-04-27 DIAGNOSIS — S82201D Unspecified fracture of shaft of right tibia, subsequent encounter for closed fracture with routine healing: Secondary | ICD-10-CM | POA: Diagnosis not present

## 2018-04-27 DIAGNOSIS — H9190 Unspecified hearing loss, unspecified ear: Secondary | ICD-10-CM | POA: Diagnosis not present

## 2018-04-27 DIAGNOSIS — S069X9D Unspecified intracranial injury with loss of consciousness of unspecified duration, subsequent encounter: Secondary | ICD-10-CM | POA: Diagnosis not present

## 2018-04-28 DIAGNOSIS — S069X9A Unspecified intracranial injury with loss of consciousness of unspecified duration, initial encounter: Secondary | ICD-10-CM | POA: Diagnosis not present

## 2018-04-28 DIAGNOSIS — S81819D Laceration without foreign body, unspecified lower leg, subsequent encounter: Secondary | ICD-10-CM | POA: Diagnosis not present

## 2018-04-28 DIAGNOSIS — R42 Dizziness and giddiness: Secondary | ICD-10-CM | POA: Diagnosis not present

## 2018-04-28 DIAGNOSIS — H9190 Unspecified hearing loss, unspecified ear: Secondary | ICD-10-CM | POA: Diagnosis not present

## 2018-04-29 DIAGNOSIS — S82209A Unspecified fracture of shaft of unspecified tibia, initial encounter for closed fracture: Secondary | ICD-10-CM | POA: Insufficient documentation

## 2018-05-03 DIAGNOSIS — R52 Pain, unspecified: Secondary | ICD-10-CM | POA: Diagnosis not present

## 2018-05-03 DIAGNOSIS — Z79899 Other long term (current) drug therapy: Secondary | ICD-10-CM | POA: Diagnosis not present

## 2018-05-03 DIAGNOSIS — R600 Localized edema: Secondary | ICD-10-CM | POA: Diagnosis not present

## 2018-05-07 DIAGNOSIS — R600 Localized edema: Secondary | ICD-10-CM | POA: Diagnosis not present

## 2018-05-07 DIAGNOSIS — L03116 Cellulitis of left lower limb: Secondary | ICD-10-CM | POA: Diagnosis not present

## 2018-05-07 DIAGNOSIS — S81819D Laceration without foreign body, unspecified lower leg, subsequent encounter: Secondary | ICD-10-CM | POA: Diagnosis not present

## 2018-05-07 DIAGNOSIS — L03115 Cellulitis of right lower limb: Secondary | ICD-10-CM | POA: Diagnosis not present

## 2018-05-10 DIAGNOSIS — R600 Localized edema: Secondary | ICD-10-CM | POA: Diagnosis not present

## 2018-05-10 DIAGNOSIS — L03115 Cellulitis of right lower limb: Secondary | ICD-10-CM | POA: Diagnosis not present

## 2018-05-10 DIAGNOSIS — S32599A Other specified fracture of unspecified pubis, initial encounter for closed fracture: Secondary | ICD-10-CM | POA: Diagnosis not present

## 2018-05-10 DIAGNOSIS — L03116 Cellulitis of left lower limb: Secondary | ICD-10-CM | POA: Diagnosis not present

## 2018-05-17 DIAGNOSIS — M810 Age-related osteoporosis without current pathological fracture: Secondary | ICD-10-CM | POA: Diagnosis not present

## 2018-05-17 DIAGNOSIS — E039 Hypothyroidism, unspecified: Secondary | ICD-10-CM | POA: Diagnosis not present

## 2018-05-17 DIAGNOSIS — I480 Paroxysmal atrial fibrillation: Secondary | ICD-10-CM | POA: Diagnosis not present

## 2018-05-17 DIAGNOSIS — I509 Heart failure, unspecified: Secondary | ICD-10-CM | POA: Diagnosis not present

## 2018-05-19 DIAGNOSIS — R600 Localized edema: Secondary | ICD-10-CM | POA: Diagnosis not present

## 2018-05-19 DIAGNOSIS — S32599A Other specified fracture of unspecified pubis, initial encounter for closed fracture: Secondary | ICD-10-CM | POA: Diagnosis not present

## 2018-05-19 DIAGNOSIS — S81819D Laceration without foreign body, unspecified lower leg, subsequent encounter: Secondary | ICD-10-CM | POA: Diagnosis not present

## 2018-05-19 DIAGNOSIS — R52 Pain, unspecified: Secondary | ICD-10-CM | POA: Diagnosis not present

## 2018-05-20 DIAGNOSIS — H6123 Impacted cerumen, bilateral: Secondary | ICD-10-CM | POA: Diagnosis not present

## 2018-05-21 DIAGNOSIS — Z79899 Other long term (current) drug therapy: Secondary | ICD-10-CM | POA: Diagnosis not present

## 2018-05-21 DIAGNOSIS — S81819D Laceration without foreign body, unspecified lower leg, subsequent encounter: Secondary | ICD-10-CM | POA: Diagnosis not present

## 2018-05-21 DIAGNOSIS — S32599A Other specified fracture of unspecified pubis, initial encounter for closed fracture: Secondary | ICD-10-CM | POA: Diagnosis not present

## 2018-05-21 DIAGNOSIS — I10 Essential (primary) hypertension: Secondary | ICD-10-CM | POA: Diagnosis not present

## 2018-05-21 DIAGNOSIS — Z4789 Encounter for other orthopedic aftercare: Secondary | ICD-10-CM | POA: Diagnosis not present

## 2018-05-21 DIAGNOSIS — I609 Nontraumatic subarachnoid hemorrhage, unspecified: Secondary | ICD-10-CM | POA: Diagnosis not present

## 2018-05-27 ENCOUNTER — Other Ambulatory Visit: Payer: Self-pay

## 2018-05-27 DIAGNOSIS — I82622 Acute embolism and thrombosis of deep veins of left upper extremity: Secondary | ICD-10-CM | POA: Diagnosis not present

## 2018-05-27 DIAGNOSIS — I509 Heart failure, unspecified: Secondary | ICD-10-CM | POA: Diagnosis not present

## 2018-05-27 DIAGNOSIS — S32512D Fracture of superior rim of left pubis, subsequent encounter for fracture with routine healing: Secondary | ICD-10-CM | POA: Diagnosis not present

## 2018-05-27 DIAGNOSIS — S82201D Unspecified fracture of shaft of right tibia, subsequent encounter for closed fracture with routine healing: Secondary | ICD-10-CM | POA: Diagnosis not present

## 2018-05-27 DIAGNOSIS — K219 Gastro-esophageal reflux disease without esophagitis: Secondary | ICD-10-CM | POA: Diagnosis not present

## 2018-05-27 DIAGNOSIS — I110 Hypertensive heart disease with heart failure: Secondary | ICD-10-CM | POA: Diagnosis not present

## 2018-05-27 DIAGNOSIS — R339 Retention of urine, unspecified: Secondary | ICD-10-CM | POA: Diagnosis not present

## 2018-05-27 DIAGNOSIS — S82402D Unspecified fracture of shaft of left fibula, subsequent encounter for closed fracture with routine healing: Secondary | ICD-10-CM | POA: Diagnosis not present

## 2018-05-27 DIAGNOSIS — J45909 Unspecified asthma, uncomplicated: Secondary | ICD-10-CM | POA: Diagnosis not present

## 2018-05-27 DIAGNOSIS — I4891 Unspecified atrial fibrillation: Secondary | ICD-10-CM | POA: Diagnosis not present

## 2018-05-27 NOTE — Patient Outreach (Signed)
[facility]) Care Management  05/27/2018  ORELIA BRANDSTETTER 1929-10-05 [id number]   EMMI- General Discharge RED ON EMMI ALERT Day # 1 Date: 05/25/18  Red Alert Reason:  Got discharge papers? I Don't Know  Know who to call about changes in condition? No    Outreach attempt: no answer.  HIPAA compliant voice message left.   Plan: RN CM will attempt patient again within 4 business days and send a letter.    Jone Baseman, RN, MSN [facility] Care Management Care Management Coordinator Direct Line [phone] Toll Free: [phone]  Fax: [phone]

## 2018-05-28 ENCOUNTER — Other Ambulatory Visit: Payer: Self-pay

## 2018-05-28 ENCOUNTER — Emergency Department (HOSPITAL_COMMUNITY): Payer: Medicare HMO

## 2018-05-28 ENCOUNTER — Inpatient Hospital Stay (HOSPITAL_COMMUNITY)
Admission: EM | Admit: 2018-05-28 | Discharge: 2018-06-01 | DRG: 603 | Disposition: A | Discharge status: Home / Self Care | Payer: Medicare HMO | Attending: Family Medicine | Admitting: Family Medicine

## 2018-05-28 ENCOUNTER — Encounter (HOSPITAL_COMMUNITY): Payer: Self-pay | Admitting: *Deleted

## 2018-05-28 DIAGNOSIS — Z808 Family history of malignant neoplasm of other organs or systems: Secondary | ICD-10-CM | POA: Diagnosis not present

## 2018-05-28 DIAGNOSIS — S82209D Unspecified fracture of shaft of unspecified tibia, subsequent encounter for closed fracture with routine healing: Secondary | ICD-10-CM

## 2018-05-28 DIAGNOSIS — S82402D Unspecified fracture of shaft of left fibula, subsequent encounter for closed fracture with routine healing: Secondary | ICD-10-CM | POA: Diagnosis not present

## 2018-05-28 DIAGNOSIS — Z7901 Long term (current) use of anticoagulants: Secondary | ICD-10-CM | POA: Diagnosis not present

## 2018-05-28 DIAGNOSIS — I4891 Unspecified atrial fibrillation: Secondary | ICD-10-CM | POA: Diagnosis not present

## 2018-05-28 DIAGNOSIS — Z880 Allergy status to penicillin: Secondary | ICD-10-CM | POA: Diagnosis not present

## 2018-05-28 DIAGNOSIS — I878 Other specified disorders of veins: Secondary | ICD-10-CM | POA: Diagnosis present

## 2018-05-28 DIAGNOSIS — Z79899 Other long term (current) drug therapy: Secondary | ICD-10-CM

## 2018-05-28 DIAGNOSIS — E7800 Pure hypercholesterolemia, unspecified: Secondary | ICD-10-CM | POA: Diagnosis present

## 2018-05-28 DIAGNOSIS — M1990 Unspecified osteoarthritis, unspecified site: Secondary | ICD-10-CM | POA: Diagnosis present

## 2018-05-28 DIAGNOSIS — I82441 Acute embolism and thrombosis of right tibial vein: Secondary | ICD-10-CM | POA: Diagnosis not present

## 2018-05-28 DIAGNOSIS — I82461 Acute embolism and thrombosis of right calf muscular vein: Secondary | ICD-10-CM | POA: Diagnosis not present

## 2018-05-28 DIAGNOSIS — Z881 Allergy status to other antibiotic agents status: Secondary | ICD-10-CM

## 2018-05-28 DIAGNOSIS — L03115 Cellulitis of right lower limb: Principal | ICD-10-CM | POA: Diagnosis present

## 2018-05-28 DIAGNOSIS — E039 Hypothyroidism, unspecified: Secondary | ICD-10-CM | POA: Diagnosis present

## 2018-05-28 DIAGNOSIS — Z86718 Personal history of other venous thrombosis and embolism: Secondary | ICD-10-CM

## 2018-05-28 DIAGNOSIS — E876 Hypokalemia: Secondary | ICD-10-CM | POA: Diagnosis not present

## 2018-05-28 DIAGNOSIS — M79604 Pain in right leg: Secondary | ICD-10-CM | POA: Diagnosis not present

## 2018-05-28 DIAGNOSIS — M7989 Other specified soft tissue disorders: Secondary | ICD-10-CM | POA: Diagnosis present

## 2018-05-28 DIAGNOSIS — Z79891 Long term (current) use of opiate analgesic: Secondary | ICD-10-CM

## 2018-05-28 DIAGNOSIS — K219 Gastro-esophageal reflux disease without esophagitis: Secondary | ICD-10-CM | POA: Diagnosis present

## 2018-05-28 DIAGNOSIS — Z8249 Family history of ischemic heart disease and other diseases of the circulatory system: Secondary | ICD-10-CM

## 2018-05-28 DIAGNOSIS — M79661 Pain in right lower leg: Secondary | ICD-10-CM | POA: Diagnosis not present

## 2018-05-28 DIAGNOSIS — E785 Hyperlipidemia, unspecified: Secondary | ICD-10-CM | POA: Diagnosis present

## 2018-05-28 DIAGNOSIS — Z888 Allergy status to other drugs, medicaments and biological substances status: Secondary | ICD-10-CM

## 2018-05-28 DIAGNOSIS — I480 Paroxysmal atrial fibrillation: Secondary | ICD-10-CM | POA: Diagnosis present

## 2018-05-28 DIAGNOSIS — S32512D Fracture of superior rim of left pubis, subsequent encounter for fracture with routine healing: Secondary | ICD-10-CM | POA: Diagnosis not present

## 2018-05-28 DIAGNOSIS — I110 Hypertensive heart disease with heart failure: Secondary | ICD-10-CM | POA: Diagnosis not present

## 2018-05-28 DIAGNOSIS — R609 Edema, unspecified: Secondary | ICD-10-CM | POA: Diagnosis not present

## 2018-05-28 DIAGNOSIS — X58XXXD Exposure to other specified factors, subsequent encounter: Secondary | ICD-10-CM | POA: Diagnosis present

## 2018-05-28 DIAGNOSIS — Z66 Do not resuscitate: Secondary | ICD-10-CM | POA: Diagnosis present

## 2018-05-28 DIAGNOSIS — Z79890 Hormone replacement therapy (postmenopausal): Secondary | ICD-10-CM

## 2018-05-28 DIAGNOSIS — J45909 Unspecified asthma, uncomplicated: Secondary | ICD-10-CM | POA: Diagnosis present

## 2018-05-28 DIAGNOSIS — I447 Left bundle-branch block, unspecified: Secondary | ICD-10-CM | POA: Diagnosis present

## 2018-05-28 DIAGNOSIS — S82401D Unspecified fracture of shaft of right fibula, subsequent encounter for closed fracture with routine healing: Secondary | ICD-10-CM

## 2018-05-28 DIAGNOSIS — I509 Heart failure, unspecified: Secondary | ICD-10-CM | POA: Diagnosis not present

## 2018-05-28 DIAGNOSIS — I82622 Acute embolism and thrombosis of deep veins of left upper extremity: Secondary | ICD-10-CM | POA: Diagnosis not present

## 2018-05-28 DIAGNOSIS — R339 Retention of urine, unspecified: Secondary | ICD-10-CM | POA: Diagnosis not present

## 2018-05-28 DIAGNOSIS — Z7722 Contact with and (suspected) exposure to environmental tobacco smoke (acute) (chronic): Secondary | ICD-10-CM | POA: Diagnosis present

## 2018-05-28 DIAGNOSIS — S82201D Unspecified fracture of shaft of right tibia, subsequent encounter for closed fracture with routine healing: Secondary | ICD-10-CM | POA: Diagnosis not present

## 2018-05-28 LAB — BASIC METABOLIC PANEL
ANION GAP: 9 (ref 5–15)
BUN: 8 mg/dL (ref 8–23)
CALCIUM: 9.4 mg/dL (ref 8.9–10.3)
CHLORIDE: 107 mmol/L (ref 98–111)
CO2: 28 mmol/L (ref 22–32)
Creatinine, Ser: 0.67 mg/dL (ref 0.44–1.00)
GFR calc non Af Amer: >60 mL/min (ref >60)
Glucose, Bld: 118 mg/dL — ABNORMAL HIGH (ref 70–99)
Potassium: 3.4 mmol/L — ABNORMAL LOW (ref 3.5–5.1)
SODIUM: 144 mmol/L (ref 135–145)

## 2018-05-28 LAB — CBC WITH DIFFERENTIAL/PLATELET
Abs Immature Granulocytes: 0.07 10*3/uL (ref 0.00–0.07)
BASOS ABS: 0 10*3/uL (ref 0.0–0.1)
Basophils Relative: 0 %
EOS ABS: 0.2 10*3/uL (ref 0.0–0.5)
EOS PCT: 2 %
HCT: 34.8 % — ABNORMAL LOW (ref 36.0–46.0)
Hemoglobin: 10.9 g/dL — ABNORMAL LOW (ref 12.0–15.0)
IMMATURE GRANULOCYTES: 1 %
LYMPHS PCT: 25 %
Lymphs Abs: 2.6 10*3/uL (ref 0.7–4.0)
MCH: 30 pg (ref 26.0–34.0)
MCHC: 31.3 g/dL (ref 30.0–36.0)
MCV: 95.9 fL (ref 80.0–100.0)
Monocytes Absolute: 1.4 10*3/uL — ABNORMAL HIGH (ref 0.1–1.0)
Monocytes Relative: 14 %
NEUTROS PCT: 58 %
NRBC: 0 % (ref 0.0–0.2)
Neutro Abs: 6.2 10*3/uL (ref 1.7–7.7)
Platelets: 314 10*3/uL (ref 150–400)
RBC: 3.63 MIL/uL — AB (ref 3.87–5.11)
RDW: 17.5 % — AB (ref 11.5–15.5)
WBC: 10.4 10*3/uL (ref 4.0–10.5)

## 2018-05-28 LAB — SEDIMENTATION RATE: SED RATE: 29 mm/h — AB (ref 0–22)

## 2018-05-28 LAB — C-REACTIVE PROTEIN: CRP: 4.1 mg/dL — AB (ref <1.0)

## 2018-05-28 MED ORDER — VANCOMYCIN HCL IN DEXTROSE 1-5 GM/200ML-% IV SOLN
1000.0000 mg | Freq: Once | INTRAVENOUS | Status: AC
  Administered 2018-05-29: 1000 mg via INTRAVENOUS
  Filled 2018-05-28: qty 200

## 2018-05-28 MED ORDER — SODIUM CHLORIDE 0.9 % IV SOLN
1.0000 g | Freq: Once | INTRAVENOUS | Status: AC
  Administered 2018-05-29: 1 g via INTRAVENOUS
  Filled 2018-05-28: qty 10
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
## 2018-05-28 NOTE — Patient Outreach (Signed)
[facility]) Care Management  05/28/2018  Samantha [name] April 04, 1930 [id number]   EMMI- General Discharge RED ON EMMI ALERT Day # 1 Date: 05/25/18 Red Alert Reason: Got discharge papers? I Don't Know  Know who to call about changes in condition? No   Outreach attempt: spoke with patient.  She reports she is doing good.  Addressed with patient red alerts.  Patient states she has her discharge papers and has read over them.  She states that she knows who to call for problems.  She states she has seen the surgeon since surgery and will see again on 06/21/18.  She reports that the home health nurse came by on yesterday and recommended that she see her PCP as patient has some swelling and redness to her right leg. Patient states she has an appointment today at 4:00 pm.  She also states that the therapist is supposed to call today also but not sure of the time.  Patient states she just wants to get better but sometimes is inpatient.  Advised patient to follow instructions and notify physician of any changes.  She verbalized understanding and declined any further needs presently.     Plan: RN CM will close case.    Jone Baseman, RN, MSN [facility] Care Management Care Management Coordinator Direct Line [phone] Toll Free: [phone]  Fax: [phone]

## 2018-05-28 NOTE — ED Notes (Signed)
Patient transported to X-ray 
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
## 2018-05-28 NOTE — Progress Notes (Signed)
Pharmacy Note   A consult was received from an ED physician for vancomycin per pharmacy dosing.    The patient's profile has been reviewed for ht/wt/allergies/indication/available labs.    A one time order has been placed for vancomycin 1000 mg IV x1 .  Further antibiotics/pharmacy consults should be ordered by admitting physician if indicated.                       Thank you,  Royetta Asal, PharmD, BCPS Pager [phone] 05/28/2018 10:18 PM

## 2018-05-29 ENCOUNTER — Observation Stay (HOSPITAL_BASED_OUTPATIENT_CLINIC_OR_DEPARTMENT_OTHER): Payer: Medicare HMO

## 2018-05-29 ENCOUNTER — Other Ambulatory Visit: Payer: Self-pay

## 2018-05-29 DIAGNOSIS — I82441 Acute embolism and thrombosis of right tibial vein: Secondary | ICD-10-CM | POA: Diagnosis not present

## 2018-05-29 DIAGNOSIS — I480 Paroxysmal atrial fibrillation: Secondary | ICD-10-CM | POA: Diagnosis not present

## 2018-05-29 DIAGNOSIS — Z7901 Long term (current) use of anticoagulants: Secondary | ICD-10-CM | POA: Diagnosis not present

## 2018-05-29 DIAGNOSIS — R609 Edema, unspecified: Secondary | ICD-10-CM

## 2018-05-29 DIAGNOSIS — M79604 Pain in right leg: Secondary | ICD-10-CM | POA: Diagnosis not present

## 2018-05-29 DIAGNOSIS — Z888 Allergy status to other drugs, medicaments and biological substances status: Secondary | ICD-10-CM | POA: Diagnosis not present

## 2018-05-29 DIAGNOSIS — M7989 Other specified soft tissue disorders: Secondary | ICD-10-CM | POA: Diagnosis not present

## 2018-05-29 DIAGNOSIS — E039 Hypothyroidism, unspecified: Secondary | ICD-10-CM | POA: Diagnosis not present

## 2018-05-29 DIAGNOSIS — X58XXXD Exposure to other specified factors, subsequent encounter: Secondary | ICD-10-CM | POA: Diagnosis not present

## 2018-05-29 DIAGNOSIS — Z880 Allergy status to penicillin: Secondary | ICD-10-CM | POA: Diagnosis not present

## 2018-05-29 DIAGNOSIS — Z79899 Other long term (current) drug therapy: Secondary | ICD-10-CM | POA: Diagnosis not present

## 2018-05-29 DIAGNOSIS — E785 Hyperlipidemia, unspecified: Secondary | ICD-10-CM | POA: Diagnosis not present

## 2018-05-29 DIAGNOSIS — Z881 Allergy status to other antibiotic agents status: Secondary | ICD-10-CM | POA: Diagnosis not present

## 2018-05-29 DIAGNOSIS — Z79890 Hormone replacement therapy (postmenopausal): Secondary | ICD-10-CM | POA: Diagnosis not present

## 2018-05-29 DIAGNOSIS — S82209D Unspecified fracture of shaft of unspecified tibia, subsequent encounter for closed fracture with routine healing: Secondary | ICD-10-CM | POA: Diagnosis not present

## 2018-05-29 DIAGNOSIS — E876 Hypokalemia: Secondary | ICD-10-CM

## 2018-05-29 DIAGNOSIS — Z66 Do not resuscitate: Secondary | ICD-10-CM | POA: Diagnosis present

## 2018-05-29 DIAGNOSIS — J45909 Unspecified asthma, uncomplicated: Secondary | ICD-10-CM | POA: Diagnosis not present

## 2018-05-29 DIAGNOSIS — M1990 Unspecified osteoarthritis, unspecified site: Secondary | ICD-10-CM | POA: Diagnosis present

## 2018-05-29 DIAGNOSIS — I82461 Acute embolism and thrombosis of right calf muscular vein: Secondary | ICD-10-CM | POA: Diagnosis not present

## 2018-05-29 DIAGNOSIS — K219 Gastro-esophageal reflux disease without esophagitis: Secondary | ICD-10-CM | POA: Diagnosis present

## 2018-05-29 DIAGNOSIS — L03115 Cellulitis of right lower limb: Secondary | ICD-10-CM | POA: Diagnosis not present

## 2018-05-29 DIAGNOSIS — E7800 Pure hypercholesterolemia, unspecified: Secondary | ICD-10-CM | POA: Diagnosis present

## 2018-05-29 DIAGNOSIS — Z8249 Family history of ischemic heart disease and other diseases of the circulatory system: Secondary | ICD-10-CM | POA: Diagnosis not present

## 2018-05-29 DIAGNOSIS — Z79891 Long term (current) use of opiate analgesic: Secondary | ICD-10-CM | POA: Diagnosis not present

## 2018-05-29 DIAGNOSIS — Z808 Family history of malignant neoplasm of other organs or systems: Secondary | ICD-10-CM | POA: Diagnosis not present

## 2018-05-29 DIAGNOSIS — I447 Left bundle-branch block, unspecified: Secondary | ICD-10-CM | POA: Diagnosis present

## 2018-05-29 DIAGNOSIS — S82401D Unspecified fracture of shaft of right fibula, subsequent encounter for closed fracture with routine healing: Secondary | ICD-10-CM | POA: Diagnosis not present

## 2018-05-29 LAB — BASIC METABOLIC PANEL
Anion gap: 9 (ref 5–15)
BUN: 7 mg/dL — ABNORMAL LOW (ref 8–23)
CALCIUM: 8.8 mg/dL — AB (ref 8.9–10.3)
CO2: 26 mmol/L (ref 22–32)
Chloride: 108 mmol/L (ref 98–111)
Creatinine, Ser: 0.57 mg/dL (ref 0.44–1.00)
GFR calc Af Amer: >60 mL/min (ref >60)
GFR calc non Af Amer: >60 mL/min (ref >60)
Glucose, Bld: 123 mg/dL — ABNORMAL HIGH (ref 70–99)
Potassium: 3 mmol/L — ABNORMAL LOW (ref 3.5–5.1)
Sodium: 143 mmol/L (ref 135–145)

## 2018-05-29 LAB — CBC
HCT: 32.2 % — ABNORMAL LOW (ref 36.0–46.0)
Hemoglobin: 9.7 g/dL — ABNORMAL LOW (ref 12.0–15.0)
MCH: 29.8 pg (ref 26.0–34.0)
MCHC: 30.1 g/dL (ref 30.0–36.0)
MCV: 98.8 fL (ref 80.0–100.0)
Platelets: 283 10*3/uL (ref 150–400)
RBC: 3.26 MIL/uL — AB (ref 3.87–5.11)
RDW: 17.9 % — ABNORMAL HIGH (ref 11.5–15.5)
WBC: 9 10*3/uL (ref 4.0–10.5)
nRBC: 0 % (ref 0.0–0.2)

## 2018-05-29 MED ORDER — ACETAMINOPHEN 650 MG RE SUPP: 650.0000 mg | Freq: Four times a day (QID) | RECTAL | Status: DC | PRN

## 2018-05-29 MED ORDER — VANCOMYCIN HCL IN DEXTROSE 750-5 MG/150ML-% IV SOLN
750.0000 mg | INTRAVENOUS | Status: DC
  Administered 2018-05-29 – 2018-05-31 (×2): 750 mg via INTRAVENOUS
  Filled 2018-05-29 (×2): qty 150

## 2018-05-29 MED ORDER — ONDANSETRON HCL 4 MG/2ML IJ SOLN
4.0000 mg | Freq: Four times a day (QID) | INTRAMUSCULAR | Status: DC | PRN
  Filled 2018-05-29: qty 2

## 2018-05-29 MED ORDER — SENNOSIDES-DOCUSATE SODIUM 8.6-50 MG PO TABS: 1.0000 | ORAL_TABLET | Freq: Every evening | ORAL | Status: DC | PRN

## 2018-05-29 MED ORDER — ALBUTEROL SULFATE (2.5 MG/3ML) 0.083% IN NEBU: 3.0000 mL | INHALATION_SOLUTION | Freq: Four times a day (QID) | RESPIRATORY_TRACT | Status: DC | PRN

## 2018-05-29 MED ORDER — POTASSIUM CHLORIDE 20 MEQ/15ML (10%) PO SOLN
40.0000 meq | Freq: Once | ORAL | Status: AC
  Administered 2018-05-29: 40 meq via ORAL
  Filled 2018-05-29: qty 30

## 2018-05-29 MED ORDER — TRAMADOL HCL 50 MG PO TABS
50.0000 mg | ORAL_TABLET | Freq: Four times a day (QID) | ORAL | Status: DC | PRN
  Administered 2018-05-30 – 2018-05-31 (×2): 50 mg via ORAL
  Filled 2018-05-29 (×2): qty 1

## 2018-05-29 MED ORDER — POTASSIUM CHLORIDE CRYS ER 20 MEQ PO TBCR
40.0000 meq | EXTENDED_RELEASE_TABLET | Freq: Once | ORAL | Status: AC
  Administered 2018-05-29: 40 meq via ORAL
  Filled 2018-05-29: qty 2

## 2018-05-29 MED ORDER — MOMETASONE FURO-FORMOTEROL FUM 100-5 MCG/ACT IN AERO
2.0000 | INHALATION_SPRAY | Freq: Two times a day (BID) | RESPIRATORY_TRACT | Status: DC
  Administered 2018-05-29 – 2018-06-01 (×7): 2 via RESPIRATORY_TRACT
  Filled 2018-05-29: qty 8.8

## 2018-05-29 MED ORDER — SODIUM CHLORIDE 0.9 % IV SOLN
1.0000 g | INTRAVENOUS | Status: DC
  Administered 2018-05-29 – 2018-05-30 (×2): 1 g via INTRAVENOUS
  Filled 2018-05-29: qty 1
  Filled 2018-05-29: qty 10
  Filled 2018-05-29: qty 1

## 2018-05-29 MED ORDER — PRAVASTATIN SODIUM 20 MG PO TABS
10.0000 mg | ORAL_TABLET | Freq: Every day | ORAL | Status: DC
  Administered 2018-05-29 – 2018-05-31 (×3): 10 mg via ORAL
  Filled 2018-05-29 (×3): qty 1

## 2018-05-29 MED ORDER — ONDANSETRON HCL 4 MG PO TABS: 4.0000 mg | ORAL_TABLET | Freq: Four times a day (QID) | ORAL | Status: DC | PRN

## 2018-05-29 MED ORDER — MONTELUKAST SODIUM 10 MG PO TABS
10.0000 mg | ORAL_TABLET | Freq: Every day | ORAL | Status: DC
  Administered 2018-05-29 – 2018-05-31 (×4): 10 mg via ORAL
  Filled 2018-05-29 (×4): qty 1

## 2018-05-29 MED ORDER — APIXABAN 5 MG PO TABS
5.0000 mg | ORAL_TABLET | Freq: Two times a day (BID) | ORAL | Status: DC
  Administered 2018-05-29 – 2018-06-01 (×8): 5 mg via ORAL
  Filled 2018-05-29 (×8): qty 1

## 2018-05-29 MED ORDER — LEVOTHYROXINE SODIUM 112 MCG PO TABS
112.0000 ug | ORAL_TABLET | Freq: Every day | ORAL | Status: DC
  Administered 2018-05-29 – 2018-06-01 (×4): 112 ug via ORAL
  Filled 2018-05-29 (×4): qty 1

## 2018-05-29 MED ORDER — PANTOPRAZOLE SODIUM 40 MG PO TBEC
40.0000 mg | DELAYED_RELEASE_TABLET | Freq: Every day | ORAL | Status: DC
  Administered 2018-05-29 – 2018-06-01 (×4): 40 mg via ORAL
  Filled 2018-05-29 (×4): qty 1

## 2018-05-29 MED ORDER — ACETAMINOPHEN 325 MG PO TABS: 650.0000 mg | ORAL_TABLET | Freq: Four times a day (QID) | ORAL | Status: DC | PRN

## 2018-05-29 NOTE — Progress Notes (Signed)
PT Cancellation Note  Patient Details Name: Samantha [name] MRN: [id number] DOB: March 08, 1930   Cancelled Treatment:    Reason Eval/Treat Not Completed: Medical issues which prohibited therapy; newly dx DVT, awaiting further work up, will continue efforts to complete PT eval   [facility] 05/29/2018, 11:59 AM
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
## 2018-05-29 NOTE — Progress Notes (Signed)
OT Cancellation Note  Patient Details Name: Samantha [name] MRN: [id number] DOB: 1929/07/05   Cancelled Treatment:    Reason Eval/Treat Not Completed: Medical issues which prohibited therapy. Pt with new DVT.  Will likely check back tomorrow.  Jens Siems 05/29/2018, 1:03 PM  Lesle Chris, OTR/L Acute Rehabilitation Services [phone] [facility] pager [phone] office 05/29/2018

## 2018-05-29 NOTE — Plan of Care (Signed)
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
## 2018-05-29 NOTE — Progress Notes (Signed)
Right lower extremity venous duplex exam completed. Positive for age indeterminate deep vein thrombosis involving the right posterior tibial vein, and right gastrocnemius. Peroneal vein is not well visualized with compression, study cannot completely exclude the possiblity of thrombosis in peroneal veins. More details please see preliminary notes on CV PROC under chart review. Airam Runions H Giovan Pinsky(RDMS RVT) 05/29/18 10:29 AM

## 2018-05-29 NOTE — Progress Notes (Signed)
Pharmacy Antibiotic Note  Samantha [name] is a 84 y.o. female admitted on 05/28/2018 with cellulitis.  Pharmacy has been consulted for vancomycin dosing.  Plan: Rocephin 1 gm IV q24h (MD) Vancomycin 1 Gm x1 then 750 mg IV q36h for est AUC = 449 Goal AUC = 400-500 F/u scr/cultures/levels     Temp (24hrs), Avg:98.1 F (36.7 C), Min:97.7 F (36.5 C), Max:98.5 F (36.9 C)  Recent Labs  Lab 05/28/18 2038  WBC 10.4  CREATININE 0.67    CrCl cannot be calculated (Unknown ideal weight.).    Allergies  Allergen Reactions  . Erythromycin Diarrhea  . Benzalkonium Chloride Rash  . Erythromycin Rash  . Neosporin Plus Max St Rash  . Neosporin [Neomycin-Bacitracin Zn-Polymyx] Rash  . Penicillins Rash    DID THE REACTION INVOLVE: Swelling of the face/tongue/throat, SOB, or low BP? N Sudden or severe rash/hives, skin peeling, or the inside of the mouth or nose? Y Did it require medical treatment? N When did it last happen?Childhood If all above answers are "NO", may proceed with cephalosporin use.     Antimicrobials this admission: 12/27 rocephin >>  12/27 vancomycin >>   Dose adjustments this admission:   Microbiology results:  BCx:   UCx:    Sputum:    MRSA PCR:   Thank you for allowing pharmacy to be a part of this patient's care.  Dorrene German 05/29/2018 2:18 AM

## 2018-05-30 LAB — BASIC METABOLIC PANEL
Anion gap: 7 (ref 5–15)
BUN: 7 mg/dL — ABNORMAL LOW (ref 8–23)
CO2: 25 mmol/L (ref 22–32)
Calcium: 9 mg/dL (ref 8.9–10.3)
Chloride: 111 mmol/L (ref 98–111)
Creatinine, Ser: 0.61 mg/dL (ref 0.44–1.00)
GFR calc Af Amer: >60 mL/min (ref >60)
GFR calc non Af Amer: >60 mL/min (ref >60)
Glucose, Bld: 93 mg/dL (ref 70–99)
Potassium: 3.9 mmol/L (ref 3.5–5.1)
Sodium: 143 mmol/L (ref 135–145)

## 2018-05-30 LAB — CBC
HCT: 31.5 % — ABNORMAL LOW (ref 36.0–46.0)
Hemoglobin: 9.6 g/dL — ABNORMAL LOW (ref 12.0–15.0)
MCH: 29.9 pg (ref 26.0–34.0)
MCHC: 30.5 g/dL (ref 30.0–36.0)
MCV: 98.1 fL (ref 80.0–100.0)
Platelets: 269 10*3/uL (ref 150–400)
RBC: 3.21 MIL/uL — ABNORMAL LOW (ref 3.87–5.11)
RDW: 17.8 % — ABNORMAL HIGH (ref 11.5–15.5)
WBC: 7.7 10*3/uL (ref 4.0–10.5)
nRBC: 0 % (ref 0.0–0.2)

## 2018-05-30 MED ORDER — ENSURE ENLIVE PO LIQD
237.0000 mL | Freq: Two times a day (BID) | ORAL | Status: DC
  Administered 2018-05-30 – 2018-06-01 (×6): 237 mL via ORAL
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
## 2018-05-31 DIAGNOSIS — J45909 Unspecified asthma, uncomplicated: Secondary | ICD-10-CM

## 2018-05-31 DIAGNOSIS — E039 Hypothyroidism, unspecified: Secondary | ICD-10-CM

## 2018-05-31 DIAGNOSIS — E785 Hyperlipidemia, unspecified: Secondary | ICD-10-CM

## 2018-05-31 DIAGNOSIS — I480 Paroxysmal atrial fibrillation: Secondary | ICD-10-CM

## 2018-05-31 DIAGNOSIS — E876 Hypokalemia: Secondary | ICD-10-CM

## 2018-05-31 LAB — CREATININE, SERUM
Creatinine, Ser: 0.55 mg/dL (ref 0.44–1.00)
GFR calc non Af Amer: >60 mL/min (ref >60)

## 2018-05-31 MED ORDER — CEPHALEXIN 500 MG PO CAPS
500.0000 mg | ORAL_CAPSULE | Freq: Two times a day (BID) | ORAL | Status: DC
  Administered 2018-05-31 – 2018-06-01 (×3): 500 mg via ORAL
  Filled 2018-05-31 (×3): qty 1

## 2018-05-31 MED ORDER — DOXYCYCLINE HYCLATE 100 MG PO TABS
100.0000 mg | ORAL_TABLET | Freq: Two times a day (BID) | ORAL | Status: DC
  Administered 2018-05-31 – 2018-06-01 (×2): 100 mg via ORAL
  Filled 2018-05-31 (×2): qty 1

## 2018-05-31 MED ORDER — SODIUM CHLORIDE 0.9 % IV SOLN
INTRAVENOUS | Status: DC | PRN
  Administered 2018-05-31: 12:00:00 via INTRAVENOUS
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
## 2018-05-31 NOTE — Patient Outreach (Signed)
[place] discharge for follow up calls has been deactivated due to hospital admission.

## 2018-05-31 NOTE — Plan of Care (Signed)
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
## 2018-06-01 ENCOUNTER — Other Ambulatory Visit: Payer: Self-pay

## 2018-06-01 MED ORDER — CEPHALEXIN 500 MG PO CAPS: 500.0000 mg | ORAL_CAPSULE | Freq: Two times a day (BID) | ORAL | 0 refills | Status: DC

## 2018-06-01 MED ORDER — ALPRAZOLAM 0.25 MG PO TABS
0.2500 mg | ORAL_TABLET | Freq: Three times a day (TID) | ORAL | Status: DC | PRN
  Administered 2018-06-01: 0.25 mg via ORAL
  Filled 2018-06-01: qty 1

## 2018-06-01 MED ORDER — DOXYCYCLINE HYCLATE 100 MG PO TABS: 100.0000 mg | ORAL_TABLET | Freq: Two times a day (BID) | ORAL | 0 refills | Status: DC
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
## 2018-06-01 NOTE — Care Management Note (Signed)
Case Management Note  Patient Details  Name: Samantha [name] MRN: [id number] Date of Birth: 02/26/1930  Subjective/Objective:   Patients son had some concerns about care and has contacted to the office of patient experience and filled  a complaint. Conference held with Dr. Bonner Puna, Olen Pel, Tiffany, and myself in the patients room with Samantha [name], the patients son, on speaker phone. Discussion about current care and past concerns. Dr. Bonner Puna answered questions about medical issues. I addressed discharge concerns. Ann plans to have office of patient experience f/u with the son on concerns related to previous admission and care. After concerns address and plan was discussed, patient and son agreeable to d/c home today with [facility] services provided by [place] and Aide from [place] with continue 3h/day. I asked the patient if I could update her contact in Epic, both were agreeable to the son being the primary contact.                  Action/Plan: PT to evaluate prior to d/c and practice stairs.   Expected Discharge Date:  06/01/18               Expected Discharge Plan:  [place]  In-House Referral:  NA  Discharge planning Services  CM Consult  Post Acute Care Choice:  Home Health Choice offered to:  Patient  DME Arranged:  N/A DME Agency:  NA  HH Arranged:  RN, Disease Management, PT, OT, Nurse's Aide [place] Agency:  [facility]  Status of Service:  Completed, signed off  If discussed at [place] of Stay Meetings, dates discussed:    Additional Comments:  Guadalupe Maple, RN 06/01/2018, 12:21 PM

## 2018-06-01 NOTE — Plan of Care (Signed)
Provided discharge instructions to patient.  Discussed home medications, planned home health care, follow-up appointments, and safety.  Patient verbalized understanding of all discharge instructions.  Patient verbalized she will have appropriate assistance to safely enter her home once discharged.  All concerns addressed to patient satisfaction.
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
## 2018-06-01 NOTE — Plan of Care (Signed)

## 2018-06-01 NOTE — Patient Outreach (Addendum)
[facility] Care Management  06/01/2018  Samantha [name] [birth year] [id number]   Initial outreach regarding high priority referral received today for transportation assistance.  BSW left voicemail message.  Unsuccessful outreach letter mailed. Will attempt to reach again within four business days.    Ronn Melena, BSW Social Worker [phone]

## 2018-06-01 NOTE — Progress Notes (Signed)
Paged Dr Bonner Puna regarding pt progress with physical therapy and difficulties with steps.  Per MD, patient and patient's son agreed there was adequate assistance for her when she arrives home to help her up steps and into home.  All were in agreement that this would not be a barrier to discharge.  Discussed with patient; patient acknowledged and agreed.

## 2018-06-01 NOTE — Care Management Important Message (Signed)
Important Message  Patient Details  Name: Samantha [name] MRN: [id number] Date of Birth: 08-10-1929   Medicare Important Message Given:  Yes    Kerin Salen 06/01/2018, 11:55 AMImportant Message  Patient Details  Name: Samantha [name] MRN: [id number] Date of Birth: 1930/05/11   Medicare Important Message Given:  Yes    Kerin Salen 06/01/2018, 11:55 AM

## 2018-06-02 LAB — CULTURE, BLOOD (ROUTINE X 2)
Culture: NO GROWTH
Culture: NO GROWTH

## 2018-06-04 ENCOUNTER — Other Ambulatory Visit: Payer: Self-pay

## 2018-06-04 NOTE — Patient Outreach (Signed)
[place] [facility]) Care Management  06/04/2018  Samantha [name] 10-May-1930 [id number]   Successful outreach with Samantha [name]. Member reported that she was "feeling better" and pending visit from [place] at the time of the call. Agreeable to initial assessment and further discussion regarding [facility] services on next week.  PLAN Will follow up next week.  [place] [phone]

## 2018-06-07 ENCOUNTER — Ambulatory Visit: Payer: Self-pay

## 2018-06-08 ENCOUNTER — Other Ambulatory Visit: Payer: Self-pay

## 2018-06-08 NOTE — Patient Outreach (Addendum)
[facility]) Care Management  06/08/2018  TEREZ MONTEE 1929/08/19 [id number]   Successful outreach to Ms. Schuur regarding social work referral for transportation resources.  Ms. Depaula reported that the sister of her daughter-in-law has been taking her to MD appointments.  However, she has a relative that is ill so there may be times that she is not able to assist.  She has a couple of friends that sometimes take her to appointments but they are also ill at this time and unable to assist.  Ms Sammuel Bailiff has a vehicle but is currently unable to drive.  She mentioned several times during our conversation that she wants to get back to being able to drive herself.  Ms. Bellucci is aware of [employer] but can only utilize this transportation service within [facility].  She is also aware of private agencies that can provide transportation to [facility] but acknowledged that she cannot afford to utilize these all the time.   Ms. Belleville reported that she switched insurance plans to [facility] from [employer].  BSW inquired about whether or not she has transportation benefits but she was unsure.  BSW offered to conduct a three way call to customer service to inquire about this but she said she could call on her own.  She has transportation to her appointment with Dr. Felipa Eth on 06/09/18.  BSW agreed to follow up with her next week to see what she found out about transportation benefits through her insurance plan. Ms. Garlock reported that she is supposed to be receiving [facility] services but was unsure of the status. An in-home assessment occurred prior to her most recent hospitalization but she was under the impression that [facility] does not accept her insurance.   BSW communicated with RNCM, [name], who said she would contact Dr. Felipa Eth about a new order being placed to Home.  BSW called Ms. Broadwater back to  Inform her of this and to encourage  her to follow up with Dr. Felipa Eth about this during her follow up appointment tomorrow.  BSW will follow up with Ms. Toelle next week.   Ronn Melena, BSW Social Worker [phone]

## 2018-06-09 ENCOUNTER — Other Ambulatory Visit: Payer: Self-pay

## 2018-06-09 NOTE — Patient Outreach (Signed)
[facility]) Care Management  06/09/2018  Samantha [name] 04-Feb-1930 [id number]    Attempted follow up with [name] regarding Home Health services. Outreach unsuccessful. RNCM contacted [facility] on 06/08/18 regarding start of care and insurance requirements. Per discussion with Maudie Mercury, home health nurse, services were resumed but placed on hold 06/08/18 due to a possible copay. RNCM left message updating member's PCP. Will assist with coordinating home health services after call is returned.  PLAN Will follow up on tomorrow.  [place] [phone]

## 2018-06-09 NOTE — Patient Outreach (Signed)
This encounter was created in error - please disregard.

## 2018-06-10 ENCOUNTER — Ambulatory Visit: Payer: Medicare HMO

## 2018-06-10 ENCOUNTER — Other Ambulatory Visit: Payer: Self-pay

## 2018-06-10 NOTE — Patient Outreach (Signed)
[place] [facility]) Care Management  06/10/2018  Samantha [name] 1929/08/05 [id number]   Follow up outreach with [name] regarding home health services. Reported receiving a follow-up call from [facility] but did not receive confirmation regarding copays. Stated she was evaluated by MD yesterday but did not discuss referral order. Member informed of pending call back regarding new home health orders.   PLAN -Will follow up with MD to confirm order for home health services. -Initial visit scheduled for 06/16/17   [place] Manager [phone]

## 2018-06-15 ENCOUNTER — Ambulatory Visit: Payer: Self-pay

## 2018-06-15 ENCOUNTER — Other Ambulatory Visit: Payer: Self-pay

## 2018-06-15 NOTE — Patient Outreach (Signed)
Samantha [name] [facility]) Care Management  06/15/2018  Samantha [name] Mar 16, 1930 [id number]   Follow up call to [name] regarding social work referral for transportation resources.   During our last conversation, BSW informed [name] that she likely has transportation benefits through her new insurance carrier, [employer].  She declined BSW calling [employer] to verify and said that she could call.  As of today, [name] had not yet called so BSW offered again to call and she accepted.  BSW called her back and informed her that she does have benefit, however, they could not confirm number of rides available until she schedules her first transport.   BSW called her back to inform her of this.  BSW agreed to [employer] information that outlines how to utilize service. BSW inquired about the status of [place] services.  Per [name], she discussed these services, specifically PT, with Dr. Felipa Eth during her appointment on 06/09/18 and he was going to place an order.  [name] was unsure of the status of services but declined BSW calling provider to check. She reported that [facility], [name], is scheduled for a home visit on 06/16/18 and said that she would discuss with her at that time. She does have a CNA from [place] that is coming two times per week to assist with meal preparation, light housekeeping, etc.  This CNA is providing transportation to [name]'s orthopedic appointment scheduled for next week.  BSW will follow up with [name] next week to ensure receipt of [employer] transportation information.  Ronn Melena, BSW Social Worker [phone]

## 2018-06-16 ENCOUNTER — Other Ambulatory Visit: Payer: Self-pay

## 2018-06-16 NOTE — Patient Outreach (Signed)
[place] [facility]) Care Management   06/16/2018  DARETH ANDREW 1929-06-17 [id number]  JACLENE BARTELT is an 85 y.o. female  Subjective:  RNCM in for initial home visit. Member alert and oriented x 3.  Objective:   Review of Systems  Constitutional: Negative.   Eyes: Negative.   Respiratory: Positive for cough and sputum production.   Cardiovascular: Positive for leg swelling.       Edema to right leg. +2 pitting edema to both ankles.  Gastrointestinal: Negative.   Genitourinary: Negative.   Musculoskeletal:       Reported "stiffness" to right knee.  Skin: Negative.   Neurological: Negative.     Physical Exam  Constitutional: She is oriented to person, place, and time. She appears well-developed.  Cardiovascular: Normal rate.  Respiratory: Effort normal and breath sounds normal.  GI: Soft. Bowel sounds are normal.  Musculoskeletal:        General: Edema present.     Comments: Edema to right leg.  Neurological: She is alert and oriented to person, place, and time.  Skin: Skin is warm and dry.  Psychiatric: She has a normal mood and affect. Her behavior is normal. Judgment and thought content normal.    Encounter Medications:   Outpatient Encounter Medications as of 06/16/2018  Medication Sig Note  . albuterol (PROVENTIL HFA;VENTOLIN HFA) 108 (90 Base) MCG/ACT inhaler Inhale 1-2 puffs into the lungs every 6 (six) hours as needed for wheezing or shortness of breath.   Marland Kitchen apixaban (ELIQUIS) 5 MG TABS tablet Take 5 mg by mouth 2 (two) times daily. 05/28/2018: Pt takes when she wakes in the morning and before she goes to bed at night, no set time.   Marland Kitchen azelastine (ASTELIN) 0.1 % nasal spray Place 1 spray into both nostrils 2 (two) times daily. Use in each nostril as directed   . Calcium-Magnesium-Zinc (CALCIUM-MAGNESUIUM-ZINC PO) Take 1 tablet by mouth 3 (three) times daily.    . cephALEXin (KEFLEX) 500 MG capsule Take 1 capsule (500 mg total) by mouth every 12 (twelve)  hours.   . Cholecalciferol (VITAMIN D) 2000 units tablet Take 2,000 Units by mouth daily.   Marland Kitchen doxycycline (VIBRA-TABS) 100 MG tablet Take 1 tablet (100 mg total) by mouth every 12 (twelve) hours.   . furosemide (LASIX) 40 MG tablet Take 1 tablet (40 mg total) by mouth daily. (Patient taking differently: Take 40 mg by mouth daily. Pt takes in the afternoon with her potassium tablet)   . levothyroxine (SYNTHROID, LEVOTHROID) 112 MCG tablet Take 112 mcg by mouth daily.   Marland Kitchen loratadine (CLARITIN) 10 MG tablet Take 10 mg by mouth daily.   Marland Kitchen lovastatin (MEVACOR) 20 MG tablet Take 20 mg by mouth at bedtime.   . metoprolol succinate (TOPROL-XL) 50 MG 24 hr tablet Take 50 mg by mouth at bedtime. Take with or immediately following a meal. 05/28/2018: Pt takes each evening before bed  . mometasone-formoterol (DULERA) 100-5 MCG/ACT AERO Inhale 2 puffs into the lungs 2 (two) times daily.   . montelukast (SINGULAIR) 10 MG tablet Take 10 mg by mouth at bedtime.   . Omega-3 Fatty Acids (FISH OIL) 1000 MG CAPS Take 1,000 mg by mouth 3 (three) times daily.   Marland Kitchen omeprazole (PRILOSEC) 40 MG capsule Take 40 mg by mouth 2 (two) times daily.    . polyethylene glycol (MIRALAX / GLYCOLAX) packet Take 8.5 g by mouth every other day.   . potassium chloride (K-DUR) 10 MEQ tablet Take 1 tablet (  10 mEq total) by mouth daily. (Patient taking differently: Take 10 mEq by mouth daily. Pt takes in the afternoon with her lasix)   . sodium chloride (OCEAN) 0.65 % SOLN nasal spray Place 1 spray into both nostrils 2 (two) times daily.   . traMADol (ULTRAM) 50 MG tablet Take 1 tablet (50 mg total) by mouth every 6 (six) hours as needed (mild pain).    No facility-administered encounter medications on file as of 06/16/2018.     Functional Status:   In your present state of health, do you have any difficulty performing the following activities: 05/29/2018 04/03/2018  Hearing? N Y  Vision? N Y  Difficulty concentrating or making  decisions? N N  Walking or climbing stairs? Y Y  Dressing or bathing? Y N  Doing errands, shopping? Y N  Some recent data might be hidden    Assessment:   Initial home visit complete. Mrs. Awad reported doing well since discharge. Reported compliance with medications and using a weekly pill box. She is currently using a rollator walker to ambulate in the home. Denied falls. Stated that she cancelled previous home health visit due to concerns regarding coverage. Stated that she would like to receive therapy in the home but preferred to work with another agency. Informed that RNCM would update PCP.  Discussed care management needs and [facility] services. Member is currently receiving outreach from [employer] and denied concerns regarding medication management or affordability. Has a limited support system but reported that her daughter-in-law's sister lived across the street and available if urgent assistance is needed. Reported receiving in-home personal services from [place]. Denied concerns regarding daily nutrition or need for assistance from local food pantries. [facility] SW is currently engaged and assisting with transportation. Member denied urgent concerns and agreeable to follow up next week.   [facility] CM Care Plan Problem One     Most Recent Value  Care Plan Problem One  Risk for readmission  Role Documenting the Problem One  Care Management [place] for Problem One  Active  [facility] Long Term Goal   Patient will not be rehospitalized over the next 60 days.  [facility]N Long Term Goal Start Date  06/16/18  Interventions for Problem One Long Term Goal  Educated patient regarding medications, safety precautions and fall prevention measures. Reviewed pending appointments and transportation needs.  [facility] CM Short Term Goal #1   Over the next 30 days patient will take all medications as prescribed.  [facility] CM Short Term Goal #1 Start Date  06/16/18  Interventions for Short Term Goal #1  Discussed  medications and indications for use.  [facility]N CM Short Term Goal #2   Over the next 30 days patient will attend all follow-up appointments.  [facility] CM Short Term Goal #2 Start Date  06/16/18  Interventions for Short Term Goal #2  Reviewed pending appointments and discussed transportation needs. Patient highly encouraged to attend all appointments as scheduled.    [facility] CM Care Plan Problem Two     Most Recent Value  Care Plan Problem Two  High risk for falls.  Role Documenting the Problem Two  Care Management [place] for Problem Two  Active  Interventions for Problem Two Long Term Goal   Provided educations regarding fall prevention measures and safety in the home.  THN Long Term Goal  Patient will not fall over the next 60 days.  THN Long Term Goal Start Date  06/16/18  [facility] CM Short  Term Goal #1   Over the next 30 days patient will use assistive devices when ambulating inside and outside of the home.  THN CM Short Term Goal #1 Start Date  06/16/18  Interventions for Short Term Goal #2   Educated patient regarding importance of using walker or cane when ambulating.   THN CM Short Term Goal #2   Over the next 30 days patient will avoid lifting heavy items.  [facility]N CM Short Term Goal #2 Start Date  06/16/18  Interventions for Short Term Goal #2  Discussed importance of  avoiding heavy lifting to decrease likelyhood of falls or further injury.       PLAN Will update PCP Will follow up next week.   [place] [phone]

## 2018-06-17 ENCOUNTER — Telehealth (INDEPENDENT_AMBULATORY_CARE_PROVIDER_SITE_OTHER): Payer: Self-pay | Admitting: Orthopaedic Surgery

## 2018-06-17 NOTE — Telephone Encounter (Signed)
Patient called stating she has an appointment with Dr. Durward Fortes on 06/22/18 for a second opinion on her right tibia surgery and subsequent infection.  Patient states she was admitted to [facility] and [facility] and the records should be in her chart.  Patient states she was at [facility] for rehab but doesn't have those records.

## 2018-06-22 ENCOUNTER — Other Ambulatory Visit (INDEPENDENT_AMBULATORY_CARE_PROVIDER_SITE_OTHER): Payer: Self-pay | Admitting: *Deleted

## 2018-06-22 ENCOUNTER — Other Ambulatory Visit: Payer: Self-pay

## 2018-06-22 ENCOUNTER — Ambulatory Visit (INDEPENDENT_AMBULATORY_CARE_PROVIDER_SITE_OTHER): Payer: Medicare HMO | Admitting: Orthopaedic Surgery

## 2018-06-22 ENCOUNTER — Ambulatory Visit (INDEPENDENT_AMBULATORY_CARE_PROVIDER_SITE_OTHER): Payer: Self-pay

## 2018-06-22 ENCOUNTER — Encounter (INDEPENDENT_AMBULATORY_CARE_PROVIDER_SITE_OTHER): Payer: Self-pay | Admitting: Orthopaedic Surgery

## 2018-06-22 VITALS — BP 139/87 | HR 80 | Resp 18 | Ht 62.0 in | Wt 149.0 lb

## 2018-06-22 DIAGNOSIS — S82101A Unspecified fracture of upper end of right tibia, initial encounter for closed fracture: Secondary | ICD-10-CM | POA: Insufficient documentation

## 2018-06-22 DIAGNOSIS — S82101S Unspecified fracture of upper end of right tibia, sequela: Secondary | ICD-10-CM

## 2018-06-22 DIAGNOSIS — Z872 Personal history of diseases of the skin and subcutaneous tissue: Secondary | ICD-10-CM | POA: Diagnosis not present

## 2018-06-22 DIAGNOSIS — M79605 Pain in left leg: Secondary | ICD-10-CM | POA: Diagnosis not present

## 2018-06-22 DIAGNOSIS — M25562 Pain in left knee: Secondary | ICD-10-CM

## 2018-06-22 DIAGNOSIS — M24561 Contracture, right knee: Secondary | ICD-10-CM
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
## 2018-06-22 NOTE — Patient Outreach (Signed)
[place] [facility]) Care Management  06/22/2018  Samantha [name] 1930/03/10 [id number]   Update received from [facility] regarding physical therapy. Per staff, request was denied last month due to staffing shortages at the time of the referral. Reported that no new referrals were received.  Follow up call placed to PCP and Ortho provider office. Spoke with Dr. Rudene Anda nurse Ivin Booty regarding conversation with [facility] staff and need for new order. Left message at PCP office.  PLAN Will complete scheduled outreach on tomorrow.  [place] [phone]

## 2018-06-23 ENCOUNTER — Other Ambulatory Visit: Payer: Self-pay

## 2018-06-23 ENCOUNTER — Ambulatory Visit: Payer: Medicare HMO | Admitting: Podiatry

## 2018-06-23 ENCOUNTER — Other Ambulatory Visit: Payer: Self-pay | Admitting: *Deleted

## 2018-06-23 ENCOUNTER — Telehealth (INDEPENDENT_AMBULATORY_CARE_PROVIDER_SITE_OTHER): Payer: Self-pay | Admitting: Orthopaedic Surgery

## 2018-06-23 DIAGNOSIS — S82871A Displaced pilon fracture of right tibia, initial encounter for closed fracture: Secondary | ICD-10-CM

## 2018-06-23 NOTE — Patient Outreach (Signed)
[place] [facility]) Care Management  06/23/2018  HOLLEE FATE 1930/01/16 [id number]   Successful outreach with Ms. Holloman. Reported no changes since home visit but pending start of Home Health services. Reported declining previous home health visits due to concerns with copayments. Member aware of pending referral to [facility]. Stated that she is aware of need for physical therapy and will consider services even though copayment may be required. Reported continued use of walker in the home and confirmed continued services with [facility]. Agreed to contact RNCM if needed prior to scheduled outreach next week.  PLAN Will follow up next week.  [place] [phone]

## 2018-06-23 NOTE — Telephone Encounter (Signed)
Samantha [name] from [facility] left a voicemail stating they received the referral, but will not be able to staff her at this time.  Phone [phone]

## 2018-06-24 ENCOUNTER — Other Ambulatory Visit (INDEPENDENT_AMBULATORY_CARE_PROVIDER_SITE_OTHER): Payer: Self-pay | Admitting: Radiology

## 2018-06-24 NOTE — Telephone Encounter (Signed)
So [place] can not see pt, I tried to change it in the referral for [facility] but it is still showing --[place] in [place].  Can you please look at her order and help send it to [place] @ [phone] (his fax) Thank you

## 2018-06-24 NOTE — Telephone Encounter (Signed)
Please set up with Sonia Side

## 2018-06-25 ENCOUNTER — Other Ambulatory Visit (INDEPENDENT_AMBULATORY_CARE_PROVIDER_SITE_OTHER): Payer: Self-pay | Admitting: *Deleted

## 2018-06-25 ENCOUNTER — Ambulatory Visit: Payer: Self-pay

## 2018-06-25 ENCOUNTER — Other Ambulatory Visit: Payer: Self-pay

## 2018-06-25 NOTE — Telephone Encounter (Signed)
Order has been faxed to [place] with [facility] at [phone]

## 2018-06-25 NOTE — Patient Outreach (Signed)
[facility]) Care Management  06/25/2018  Samantha [name] 1929/07/09 [id number]    Follow up call to Samantha [name] to ensure receipt of [employer] information mailed on 06/15/18.  She was unsure if she received it so BSW provided her with contact information.  Samantha [name] verbalized understanding of how to utilize service.  BSW is closing case at this time but encouraged Samantha [name] to call if other needs arise.    Ronn Melena, BSW Social Worker [phone]

## 2018-06-30 ENCOUNTER — Other Ambulatory Visit: Payer: Self-pay

## 2018-07-01 ENCOUNTER — Ambulatory Visit: Payer: Self-pay

## 2018-07-01 NOTE — Patient Outreach (Signed)
[facility]) Care Management   Samantha [name] Jan 17, 1930 [id number]    Successful follow up with Samantha [name]. Reported receiving a call from Ortho office and informed that Samantha [name] was still unable to provide services. New physical therapy referral was submitted to [facility]. Samantha [name] confirmed contact with agency. Initial assessment tentatively scheduled for 07/01/18.    PLAN Will continue routine outreach.  [place] [phone]

## 2018-07-08 ENCOUNTER — Other Ambulatory Visit: Payer: Self-pay

## 2018-07-08 NOTE — Patient Outreach (Signed)
[facility]) Care Management  07/08/2018  Samantha [name] 07/24/1929 [id number]   Successful outreach with Samantha [name]. Confirmed start of physical therapy services with [facility]. Reports receiving therapy twice a week and tolerating well. Reported compliance with medications, safety recommendations and fall precautions. Reported ambulating well with walker and denied falls.   Primary concern was obtaining printed information for available transportation services. Agreeable to [facility] contacting [facility] SW to resend documents. Samantha [name] stated that she cancelled several appointments over the past week due to weather conditions but reported doing well. She continues to receive personal assistance from [place] twice a week and will reschedule required appointments after transportation documents are received.  Samantha [name] denied urgent concerns and agreed to contact RNCM if needed prior to next outreach.  PLAN Will follow up with [facility] SW. Will continue routine outreach.  [place] [phone]

## 2018-07-08 NOTE — Patient Outreach (Signed)
[place]) Care Management  07/08/2018  Samantha [name] 17-May-1930 [id number]   Request received from [facility], [name] to mail [employer] information to patient.  Musician and instructions for using service mailed to patient today.  Ronn Melena, BSW Social Worker [phone]

## 2018-07-22 ENCOUNTER — Other Ambulatory Visit: Payer: Self-pay

## 2018-07-22 ENCOUNTER — Ambulatory Visit: Payer: Self-pay

## 2018-07-22 NOTE — Patient Outreach (Signed)
[place] [facility]) Care Management  07/22/2018  SHENIKA QUINT December 09, 1929 [id number]  Routine home visit rescheduled. Ms. Cielo will be evaluated by Dr. Stann Mainland Rosanne Gutting) on 07/29/18. RNCM will complete follow-up assessment after ortho evaluation. PLAN Will follow up within two weeks.  [place] [phone]

## 2018-07-29 ENCOUNTER — Ambulatory Visit: Payer: Self-pay | Admitting: Podiatry

## 2018-08-03 ENCOUNTER — Other Ambulatory Visit: Payer: Self-pay
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
## 2018-08-19 ENCOUNTER — Encounter

## 2018-08-19 ENCOUNTER — Other Ambulatory Visit: Payer: Self-pay

## 2018-08-19 ENCOUNTER — Ambulatory Visit: Payer: Medicare Other | Admitting: Podiatry

## 2018-08-19 DIAGNOSIS — B351 Tinea unguium: Secondary | ICD-10-CM | POA: Diagnosis not present

## 2018-08-19 DIAGNOSIS — M79676 Pain in unspecified toe(s): Secondary | ICD-10-CM

## 2018-08-19 NOTE — Patient Instructions (Signed)

## 2018-08-24 ENCOUNTER — Other Ambulatory Visit: Payer: Self-pay

## 2018-08-24 NOTE — Patient Outreach (Signed)
[facility]) Care Management  08/24/2018  Samantha [name] 1930-05-08 [id number]    Successful outreach with Ms. Zeiss. Reports doing very well. Remains compliant with medications and treatment recommendations. Reports ambulating well with walker and continues to perform ADLs independently. Discussed care management needs. She has progressed well. She has resumed driving but continues to receive assistance with light housework and small tasks in the home. Denied need for additional community resource referrals. Agreeable to follow-up assessment prior to discharge next month.  PLAN Will follow up next month.   [place] [phone]

## 2018-08-26 ENCOUNTER — Encounter: Payer: Self-pay | Admitting: Podiatry

## 2018-08-26 NOTE — Progress Notes (Signed)
Subjective: Samantha [name] presents today with painful, thick toenails 1-5 b/l that she cannot cut and which interfere with daily activities.  Pain is aggravated when wearing enclosed shoe gear and relieved with periodic professional debridement.  Lajean Manes, MD is her PCP.    Current Outpatient Medications:  .  albuterol (PROVENTIL HFA;VENTOLIN HFA) 108 (90 Base) MCG/ACT inhaler, Inhale 1-2 puffs into the lungs every 6 (six) hours as needed for wheezing or shortness of breath., Disp: , Rfl:  .  apixaban (ELIQUIS) 5 MG TABS tablet, Take 5 mg by mouth 2 (two) times daily., Disp: , Rfl:  .  azelastine (ASTELIN) 0.1 % nasal spray, Place 1 spray into both nostrils 2 (two) times daily. Use in each nostril as directed, Disp: , Rfl:  .  Calcium-Magnesium-Zinc (CALCIUM-MAGNESUIUM-ZINC PO), Take 1 tablet by mouth 3 (three) times daily. , Disp: , Rfl:  .  cephALEXin (KEFLEX) 500 MG capsule, Take 1 capsule (500 mg total) by mouth every 12 (twelve) hours., Disp: 12 capsule, Rfl: 0 .  Cholecalciferol (VITAMIN D) 2000 units tablet, Take 2,000 Units by mouth daily., Disp: , Rfl:  .  doxycycline (VIBRA-TABS) 100 MG tablet, Take 1 tablet (100 mg total) by mouth every 12 (twelve) hours., Disp: 12 tablet, Rfl: 0 .  furosemide (LASIX) 40 MG tablet, Take 1 tablet (40 mg total) by mouth daily. (Patient taking differently: Take 40 mg by mouth daily. Pt takes in the afternoon with her potassium tablet), Disp: 90 tablet, Rfl: 3 .  levothyroxine (SYNTHROID, LEVOTHROID) 112 MCG tablet, Take 112 mcg by mouth daily., Disp: , Rfl:  .  loratadine (CLARITIN) 10 MG tablet, Take 10 mg by mouth daily., Disp: , Rfl:  .  lovastatin (MEVACOR) 20 MG tablet, Take 20 mg by mouth at bedtime., Disp: , Rfl:  .  metoprolol succinate (TOPROL-XL) 50 MG 24 hr tablet, Take 50 mg by mouth at bedtime. Take with or immediately following a meal., Disp: , Rfl:  .  mometasone-formoterol (DULERA) 100-5 MCG/ACT AERO, Inhale 2 puffs into the lungs 2  (two) times daily., Disp: , Rfl:  .  montelukast (SINGULAIR) 10 MG tablet, Take 10 mg by mouth at bedtime., Disp: , Rfl:  .  Omega-3 Fatty Acids (FISH OIL) 1000 MG CAPS, Take 1,000 mg by mouth 3 (three) times daily., Disp: , Rfl:  .  omeprazole (PRILOSEC) 40 MG capsule, Take 40 mg by mouth 2 (two) times daily. , Disp: , Rfl:  .  polyethylene glycol (MIRALAX / GLYCOLAX) packet, Take 8.5 g by mouth every other day., Disp: , Rfl:  .  potassium chloride (K-DUR) 10 MEQ tablet, Take 1 tablet (10 mEq total) by mouth daily. (Patient taking differently: Take 10 mEq by mouth daily. Pt takes in the afternoon with her lasix), Disp: 90 tablet, Rfl: 3 .  sodium chloride (OCEAN) 0.65 % SOLN nasal spray, Place 1 spray into both nostrils 2 (two) times daily., Disp: , Rfl:  .  traMADol (ULTRAM) 50 MG tablet, Take 1 tablet (50 mg total) by mouth every 6 (six) hours as needed (mild pain)., Disp: 30 tablet, Rfl: 0  Allergies  Allergen Reactions  . Erythromycin Diarrhea  . Benzalkonium Chloride Rash  . Erythromycin Rash  . Neosporin Plus Max St Rash  . Neosporin [Neomycin-Bacitracin Zn-Polymyx] Rash  . Penicillins Rash    DID THE REACTION INVOLVE: Swelling of the face/tongue/throat, SOB, or low BP? N Sudden or severe rash/hives, skin peeling, or the inside of the mouth or nose? Y Did  it require medical treatment? N When did it last happen?Childhood If all above answers are "NO", may proceed with cephalosporin use.     Objective:  Vascular Examination: Capillary refill time immediate x 10 digits  Dorsalis pedis and Posterior tibial pulses palpable b/l  Digital hair present x 10 digits  Skin temperature gradient WNL b/l  Dermatological Examination: Skin with normal turgor, texture and tone b/l  Toenails 1-5 b/l discolored, thick, dystrophic with subungual debris and pain with palpation to nailbeds due to thickness of nails.  Musculoskeletal: Muscle strength 5/5 to all LE muscle groups  No  gross bony deformities b/l.  No pain, crepitus or joint limitation noted with ROM.   Neurological: Sensation intact with 10 gram monofilament.  Vibratory sensation intact.  Assessment: Painful onychomycosis toenails 1-5 b/l   Plan: 1. Toenails 1-5 b/l were debrided in length and girth without iatrogenic bleeding. 2. Patient to continue soft, supportive shoe gear daily. 3. Patient to report any pedal injuries to medical professional immediately. 4. Follow up prn per patient.  5. Patient/POA to call should there be a concern in the interim.

## 2018-09-17 ENCOUNTER — Other Ambulatory Visit: Payer: Self-pay

## 2018-09-17 NOTE — Patient Outreach (Signed)
[facility]) Care Management  09/17/2018  KAELENE ELLISTON 1929-12-01 [id number]   Outreach with Ms. Kloss prior to case closure. She reports doing very well since last conversation. She remains compliant with MD treatment recommendations and successfully met her care goals.   Discussed care management needs and recommendations related to COVID-19. She denies immediate needs and reports compliance with recommended respiratory restrictions. She reports driving short distances, ambulating well, and performing ADLs independently. Reports not utilizing in-home services but states a good friend is available to assist when needed. She anticipates resuming services with Mickel Crow to assist with light housework once respiratory restrictions are lifted. Denies changes in care management needs. Agreeable to case closure.  PLAN Will complete case closure.   [place] [phone]

## 2018-12-27 ENCOUNTER — Ambulatory Visit: Payer: Medicare Other | Admitting: Podiatry

## 2018-12-27 ENCOUNTER — Encounter: Payer: Self-pay | Admitting: Podiatry

## 2018-12-27 ENCOUNTER — Other Ambulatory Visit: Payer: Self-pay

## 2018-12-27 DIAGNOSIS — M79676 Pain in unspecified toe(s): Secondary | ICD-10-CM

## 2018-12-27 DIAGNOSIS — B351 Tinea unguium: Secondary | ICD-10-CM

## 2018-12-27 NOTE — Patient Instructions (Signed)

## 2018-12-30 NOTE — Progress Notes (Signed)
Subjective: Samantha [name] is a 85 y.o. y.o. female who is on long term blood thinner, Eliquis presents today with painful, discolored, thick toenails  which interfere with daily activities. Pain is aggravated when wearing enclosed shoe gear. Pain is relieved with periodic professional debridement.  Patient's PCP is [place], Christiane Ha, MD.   Current Outpatient Medications:  .  albuterol (PROVENTIL HFA;VENTOLIN HFA) 108 (90 Base) MCG/ACT inhaler, Inhale 1-2 puffs into the lungs every 6 (six) hours as needed for wheezing or shortness of breath., Disp: , Rfl:  .  apixaban (ELIQUIS) 5 MG TABS tablet, Take 5 mg by mouth 2 (two) times daily., Disp: , Rfl:  .  azelastine (ASTELIN) 0.1 % nasal spray, Place 1 spray into both nostrils 2 (two) times daily. Use in each nostril as directed, Disp: , Rfl:  .  Calcium-Magnesium-Zinc (CALCIUM-MAGNESUIUM-ZINC PO), Take 1 tablet by mouth 3 (three) times daily. , Disp: , Rfl:  .  cephALEXin (KEFLEX) 500 MG capsule, Take 1 capsule (500 mg total) by mouth every 12 (twelve) hours., Disp: 12 capsule, Rfl: 0 .  Cholecalciferol (VITAMIN D) 2000 units tablet, Take 2,000 Units by mouth daily., Disp: , Rfl:  .  doxycycline (VIBRA-TABS) 100 MG tablet, Take 1 tablet (100 mg total) by mouth every 12 (twelve) hours., Disp: 12 tablet, Rfl: 0 .  furosemide (LASIX) 40 MG tablet, Take 1 tablet (40 mg total) by mouth daily. (Patient taking differently: Take 40 mg by mouth daily. Pt takes in the afternoon with her potassium tablet), Disp: 90 tablet, Rfl: 3 .  levothyroxine (SYNTHROID, LEVOTHROID) 112 MCG tablet, Take 112 mcg by mouth daily., Disp: , Rfl:  .  loratadine (CLARITIN) 10 MG tablet, Take 10 mg by mouth daily., Disp: , Rfl:  .  lovastatin (MEVACOR) 20 MG tablet, Take 20 mg by mouth at bedtime., Disp: , Rfl:  .  metoprolol succinate (TOPROL-XL) 50 MG 24 hr tablet, Take 50 mg by mouth at bedtime. Take with or immediately following a meal., Disp: , Rfl:  .  mometasone-formoterol  (DULERA) 100-5 MCG/ACT AERO, Inhale 2 puffs into the lungs 2 (two) times daily., Disp: , Rfl:  .  montelukast (SINGULAIR) 10 MG tablet, Take 10 mg by mouth at bedtime., Disp: , Rfl:  .  Omega-3 Fatty Acids (FISH OIL) 1000 MG CAPS, Take 1,000 mg by mouth 3 (three) times daily., Disp: , Rfl:  .  omeprazole (PRILOSEC) 40 MG capsule, Take 40 mg by mouth 2 (two) times daily. , Disp: , Rfl:  .  polyethylene glycol (MIRALAX / GLYCOLAX) packet, Take 8.5 g by mouth every other day., Disp: , Rfl:  .  potassium chloride (K-DUR) 10 MEQ tablet, Take 1 tablet (10 mEq total) by mouth daily. (Patient taking differently: Take 10 mEq by mouth daily. Pt takes in the afternoon with her lasix), Disp: 90 tablet, Rfl: 3 .  sodium chloride (OCEAN) 0.65 % SOLN nasal spray, Place 1 spray into both nostrils 2 (two) times daily., Disp: , Rfl:  .  traMADol (ULTRAM) 50 MG tablet, Take 1 tablet (50 mg total) by mouth every 6 (six) hours as needed (mild pain)., Disp: 30 tablet, Rfl: 0   Allergies  Allergen Reactions  . Erythromycin Diarrhea  . Benzalkonium Chloride Rash  . Erythromycin Rash  . Neosporin Plus Max St Rash  . Neosporin [Neomycin-Bacitracin Zn-Polymyx] Rash  . Penicillins Rash    DID THE REACTION INVOLVE: Swelling of the face/tongue/throat, SOB, or low BP? N Sudden or severe rash/hives, skin peeling, or  the inside of the mouth or nose? Y Did it require medical treatment? N When did it last happen?Childhood If all above answers are "NO", may proceed with cephalosporin use.      Objective: Vascular Examination: Capillary refill time immediate x 10.  Dorsalis pedis pulses palpable b/l.  Posterior tibial pulses palpable b/l.  No digital hair x 10 digits.  Skin temperature gradient WNL b/l.  Dermatological Examination: Skin with normal turgor, texture and tone b/l.  Toenails 1-5 b/l discolored, thick, dystrophic with subungual debris and pain with palpation to nailbeds due to thickness of  nails.  Musculoskeletal: Muscle strength 5/5 to all LE muscle groups.  Neurological: Sensation intact with 10 gram monofilament.  Vibratory sensation intact.  Assessment: Painful onychomycosis toenails 1-5 b/l in patient on blood thinner.   Plan: 1. Toenails 1-5 b/l were debrided in length and girth without iatrogenic bleeding. 2. Patient to continue soft, supportive shoe gear daily. 3. Patient to report any pedal injuries to medical professional immediately. 4. Avoid self trimming due to use of blood thinner. 5. Follow up 3 months.  6. Patient/POA to call should there be a concern in the interim.

## 2019-01-11 ENCOUNTER — Other Ambulatory Visit: Payer: Self-pay

## 2019-01-11 ENCOUNTER — Ambulatory Visit: Payer: Self-pay

## 2019-01-11 ENCOUNTER — Ambulatory Visit (INDEPENDENT_AMBULATORY_CARE_PROVIDER_SITE_OTHER): Payer: Medicare Other | Admitting: Orthopaedic Surgery

## 2019-01-11 ENCOUNTER — Encounter: Payer: Self-pay | Admitting: Orthopaedic Surgery

## 2019-01-11 VITALS — BP 130/66 | HR 71 | Ht 62.0 in | Wt 133.0 lb

## 2019-01-11 DIAGNOSIS — M79672 Pain in left foot: Secondary | ICD-10-CM | POA: Diagnosis not present

## 2019-01-11 NOTE — Progress Notes (Signed)
Office Visit Note   Patient: Samantha [name]           Date of Birth: [birth year]           MRN: [id number] Visit Date: 01/11/2019              Requested by: Lajean Manes, MD [address] [place],  [place] [address] PCP: Lajean Manes, MD   Assessment & Plan: Visit Diagnoses:  1. Pain in left foot     Plan:  #1: This time we will try a orthotics for the both feet and see if this helps her pain. #2:.  Follow-up in 1 she does have a fracture boot from a previous tibial fracture on the right side and she may use this in place it on the left. #3: If her symptoms worsen she may come and we can place her into more of a rigid orthotic or cast.  Hopefully just a equalizer boot would be benefit. #4: Follow back up in the next 2 to 3 weeks for recheck evaluation.  Follow-Up Instructions: Return in about 2 weeks (around 01/25/2019).   Orders:  Orders Placed This Encounter  Procedures   XR Foot Complete Left   No orders of the defined types were placed in this encounter.     Procedures: No procedures performed   Clinical Data: No additional findings.   Subjective: Chief Complaint  Patient presents with   Left Foot - Pain    HPI: Patient presents today for left foot pain that started last week. She was standing up from the couch last Thursday and had immediate pain in her foot, which caused her to have to sit back down. She said that every bone in her foot was extremely painful, and felt like "every bone was broke". She said that her foot was swollen the following the day. She said that it has gradually improved. She wore a shoe with a small heel this past weekend and that helped as well. She is not taking anything for pain.    Review of Systems  Constitutional: Positive for fatigue.  HENT: Negative for ear pain.   Eyes: Negative for pain.  Respiratory: Negative for shortness of breath.   Cardiovascular: Negative for leg swelling.  Gastrointestinal:  Negative for constipation and diarrhea.  Endocrine: Negative for cold intolerance and heat intolerance.  Genitourinary: Negative for difficulty urinating.  Musculoskeletal: Negative for joint swelling.  Skin: Negative for rash.  Allergic/Immunologic: Negative for food allergies.  Hematological: Does not bruise/bleed easily.  Psychiatric/Behavioral: Negative for sleep disturbance.     Objective: Vital Signs: BP 130/66    Pulse 71    Ht 5\' 2"  (1.575 m)    Wt 133 lb (60.3 kg)    BMI 24.33 kg/m   Physical Exam Constitutional:      Appearance: Normal appearance. She is well-developed.  HENT:     Head: Normocephalic.  Eyes:     Pupils: Pupils are equal, round, and reactive to light.  Pulmonary:     Effort: Pulmonary effort is normal.  Skin:    General: Skin is warm and dry.  Neurological:     Mental Status: She is alert and oriented to person, place, and time.  Psychiatric:        Behavior: Behavior normal.     Ortho Exam  Today she comes in with tenderness to palpation more over the medial aspect of the foot at the area of the insertion of the posterior  tibial tendon.  No ecchymosis or erythema noted.  Not much in the way of some swelling though.  She has flattening of the arch when she stands up on exam.  She does have some pain with twisting of the midfoot area.  No lateral pain. Not particularly painful at the distal metatrarsals   Specialty Comments:  No specialty comments available.  Imaging: No results found.   PMFS History: Current Outpatient Medications  Medication Sig Dispense Refill   albuterol (PROVENTIL HFA;VENTOLIN HFA) 108 (90 Base) MCG/ACT inhaler Inhale 1-2 puffs into the lungs every 6 (six) hours as needed for wheezing or shortness of breath.     apixaban (ELIQUIS) 5 MG TABS tablet Take 5 mg by mouth 2 (two) times daily.     azelastine (ASTELIN) 0.1 % nasal spray Place 1 spray into both nostrils 2 (two) times daily. Use in each nostril as directed      Calcium-Magnesium-Zinc (CALCIUM-MAGNESUIUM-ZINC PO) Take 1 tablet by mouth 3 (three) times daily.      cephALEXin (KEFLEX) 500 MG capsule Take 1 capsule (500 mg total) by mouth every 12 (twelve) hours. 12 capsule 0   Cholecalciferol (VITAMIN D) 2000 units tablet Take 2,000 Units by mouth daily.     doxycycline (VIBRA-TABS) 100 MG tablet Take 1 tablet (100 mg total) by mouth every 12 (twelve) hours. 12 tablet 0   furosemide (LASIX) 40 MG tablet Take 1 tablet (40 mg total) by mouth daily. (Patient taking differently: Take 40 mg by mouth daily. Pt takes in the afternoon with her potassium tablet) 90 tablet 3   levothyroxine (SYNTHROID, LEVOTHROID) 112 MCG tablet Take 112 mcg by mouth daily.     loratadine (CLARITIN) 10 MG tablet Take 10 mg by mouth daily.     lovastatin (MEVACOR) 20 MG tablet Take 20 mg by mouth at bedtime.     metoprolol succinate (TOPROL-XL) 50 MG 24 hr tablet Take 50 mg by mouth at bedtime. Take with or immediately following a meal.     mometasone-formoterol (DULERA) 100-5 MCG/ACT AERO Inhale 2 puffs into the lungs 2 (two) times daily.     montelukast (SINGULAIR) 10 MG tablet Take 10 mg by mouth at bedtime.     Omega-3 Fatty Acids (FISH OIL) 1000 MG CAPS Take 1,000 mg by mouth 3 (three) times daily.     omeprazole (PRILOSEC) 40 MG capsule Take 40 mg by mouth 2 (two) times daily.      polyethylene glycol (MIRALAX / GLYCOLAX) packet Take 8.5 g by mouth every other day.     potassium chloride (K-DUR) 10 MEQ tablet Take 1 tablet (10 mEq total) by mouth daily. (Patient taking differently: Take 10 mEq by mouth daily. Pt takes in the afternoon with her lasix) 90 tablet 3   sodium chloride (OCEAN) 0.65 % SOLN nasal spray Place 1 spray into both nostrils 2 (two) times daily.     traMADol (ULTRAM) 50 MG tablet Take 1 tablet (50 mg total) by mouth every 6 (six) hours as needed (mild pain). 30 tablet 0   No current facility-administered medications for this visit.      Patient Active Problem List   Diagnosis Date Noted   Closed fracture of right proximal tibia 06/22/2018   Paroxysmal atrial fibrillation ([place]) 05/29/2018   Hypokalemia 05/29/2018   Pain and swelling of right lower extremity 05/28/2018   SAH (subarachnoid hemorrhage) ([place]) 04/02/2018   Chronic bilateral low back pain with bilateral sciatica 03/08/2018   Abnormal CT scan, chest 08/08/2016  Near syncope 01/06/2015   Left leg weakness 01/06/2015   Hyperlipidemia    Hypothyroidism    Ventricular ectopy    Chest pain 05/22/2014   Left bundle branch block 05/22/2014   PVC's (premature ventricular contractions) 05/22/2014   Asthma 02/14/2010   HOARSENESS 02/14/2010   CHEST PAIN, ATYPICAL 02/14/2010   ALLERGIC RHINITIS 02/13/2010   G E R D 02/13/2010   Past Medical History:  Diagnosis Date   Arthritis    Asthma    GERD (gastroesophageal reflux disease)    High cholesterol    Hypothyroidism    LBBB (left bundle branch block)    Ventricular ectopy     Family History  Problem Relation Age of Onset   Heart attack Mother    Throat cancer Father    Congestive Heart Failure Sister    Congestive Heart Failure Brother     Past Surgical History:  Procedure Laterality Date   CARDIOVERSION N/A 11/27/2015   Procedure: CARDIOVERSION;  Surgeon: Josue Hector, MD;  Location: [facility] ENDOSCOPY;  Service: Cardiovascular;  Laterality: N/A;   KNEE ARTHROSCOPY     TIBIA IM NAIL INSERTION Right 04/05/2018   Procedure: INTRAMEDULLARY (IM) NAIL TIBIAL;  Surgeon: Nicholes Stairs, MD;  Location: [place];  Service: Orthopedics;  Laterality: Right;   Social History   Occupational History   Not on file  Tobacco Use   Smoking status: Passive Smoke Exposure - Never Smoker   Smokeless tobacco: Never Used  Substance and Sexual Activity   Alcohol use: No    Alcohol/week: 0.0 standard drinks   Drug use: No   Sexual activity: Not on file

## 2019-02-03 ENCOUNTER — Telehealth: Payer: Self-pay | Admitting: Orthopaedic Surgery

## 2019-02-03 NOTE — Telephone Encounter (Signed)
Spoke with patient.

## 2019-02-03 NOTE — Telephone Encounter (Signed)
Patient called stating she has a couple of questions and requesting a return call::  (1) Patient has questions regarding her right foot which has been improving. (2) Patient states she has been dealing with carpal tunnel syndrome especially pain in her left thumb and wrist and is asking if Dr. Durward Fortes can look at her wrist during her appointment or if she needs to schedule a separate appointment. (3)  Patient also stated she went to a birthday party for her great great grandson who turned 4 and there were approximately 28-30 people at the party on 01/29/19.  Patient states only a couple of them wore masks and is worried she might have been exposed and has questions.

## 2019-02-10 ENCOUNTER — Other Ambulatory Visit: Payer: Self-pay

## 2019-02-10 ENCOUNTER — Encounter: Payer: Self-pay | Admitting: Orthopaedic Surgery

## 2019-02-10 ENCOUNTER — Ambulatory Visit (INDEPENDENT_AMBULATORY_CARE_PROVIDER_SITE_OTHER): Payer: Medicare Other | Admitting: Orthopaedic Surgery

## 2019-02-10 DIAGNOSIS — M65312 Trigger thumb, left thumb: Secondary | ICD-10-CM | POA: Insufficient documentation

## 2019-02-10 DIAGNOSIS — R200 Anesthesia of skin: Secondary | ICD-10-CM | POA: Insufficient documentation

## 2019-02-10 DIAGNOSIS — R202 Paresthesia of skin: Secondary | ICD-10-CM

## 2019-02-10 DIAGNOSIS — M79672 Pain in left foot: Secondary | ICD-10-CM | POA: Diagnosis not present

## 2019-02-10 NOTE — Progress Notes (Signed)
Office Visit Note   Patient: Samantha [name]           Date of Birth: Oct 14, 1929           MRN: [id number] Visit Date: 02/10/2019              Requested by: Lajean Manes, MD [address] [place],  [place] [address] PCP: Lajean Manes, MD   Assessment & Plan: Visit Diagnoses:  1. Trigger thumb, left thumb   2. Numbness and tingling in right hand   3. Pain in left foot     Plan:  #1:  EMG's & NCV's both hands to r/o CTS #2:  Continue orthotics since they are very beneficial for her left foot pain #3:  IP splint to let thumb and Voltaren Gel to A-1 pulley ( may require A-1 pulley release in future  Follow-Up Instructions: Return if symptoms worsen or fail to improve, for review of EMG's.   Face-to-face time spent with patient was greater than 30 minutes.  Greater than 50% of the time was spent in counseling and coordination of care.  Orders:  No orders of the defined types were placed in this encounter.  No orders of the defined types were placed in this encounter.     Procedures: No procedures performed   Clinical Data: No additional findings.   Subjective: Chief Complaint  Patient presents with  . Left Foot - Follow-up   HPI  Patient presents today for a one month follow up on her left foot pain. Patient states that her foot has improved almost 100%. The inserts given to her feel great at her arch, but hurts her toes. She has been alternating between a couple different pairs of inserts to alleviate her toe pain. The shorter version is more comfortable.  She also wants to have her hands evaluated today. She states that she has carpal tunnel in both wrists. She wears a splint at night on both sides.   She has pain in her left thumb that seems to bother her the most. This also causes her to have her thumb lock up in flexion and then has to "pop" the thumb into full extension to fix it.    Review of Systems  Constitutional: Negative for fatigue.   HENT: Negative for ear pain.   Eyes: Negative for pain.  Respiratory: Negative for shortness of breath.   Cardiovascular: Negative for leg swelling.  Gastrointestinal: Negative for constipation and diarrhea.  Endocrine: Negative for cold intolerance and heat intolerance.  Genitourinary: Negative for difficulty urinating.  Musculoskeletal: Negative for joint swelling.  Skin: Negative for rash.  Allergic/Immunologic: Negative for food allergies.  Neurological: Negative for weakness.  Hematological: Does not bruise/bleed easily.  Psychiatric/Behavioral: Negative for sleep disturbance.     Objective: Vital Signs: BP 127/66   Pulse 68   Ht 5' 1.75" (1.568 m)   Wt 135 lb (61.2 kg)   BMI 24.89 kg/m   Physical Exam Constitutional:      Appearance: She is well-developed.  Eyes:     Pupils: Pupils are equal, round, and reactive to light.  Pulmonary:     Effort: Pulmonary effort is normal.  Skin:    General: Skin is warm and dry.  Neurological:     Mental Status: She is alert and oriented to person, place, and time.  Psychiatric:        Behavior: Behavior normal.     Ortho Exam  Examination today of the  left foot reveals much less tenderness to palpation over the plantar fascial area and the plantar calcaneus.  Good smooth motion without crepitance.  Neurovascular intact distally.  The left hand reveals triggering of the thumb.  There is a nodule on the tendon at the A1 pulley of the left thumb.  The right hand has some atrophy of the thenar area.  She does still have some good strength between opposition of thumb and little finger.  She does have decreased sensation in the long finger on the volar aspect.  Specialty Comments:  No specialty comments available.  Imaging: No results found.   PMFS History: Current Outpatient Medications  Medication Sig Dispense Refill  . albuterol (PROVENTIL HFA;VENTOLIN HFA) 108 (90 Base) MCG/ACT inhaler Inhale 1-2 puffs into the lungs  every 6 (six) hours as needed for wheezing or shortness of breath.    Marland Kitchen apixaban (ELIQUIS) 5 MG TABS tablet Take 5 mg by mouth 2 (two) times daily.    Marland Kitchen azelastine (ASTELIN) 0.1 % nasal spray Place 1 spray into both nostrils 2 (two) times daily. Use in each nostril as directed    . Calcium-Magnesium-Zinc (CALCIUM-MAGNESUIUM-ZINC PO) Take 1 tablet by mouth 3 (three) times daily.     . cephALEXin (KEFLEX) 500 MG capsule Take 1 capsule (500 mg total) by mouth every 12 (twelve) hours. 12 capsule 0  . Cholecalciferol (VITAMIN D) 2000 units tablet Take 2,000 Units by mouth daily.    Marland Kitchen doxycycline (VIBRA-TABS) 100 MG tablet Take 1 tablet (100 mg total) by mouth every 12 (twelve) hours. 12 tablet 0  . furosemide (LASIX) 40 MG tablet Take 1 tablet (40 mg total) by mouth daily. (Patient taking differently: Take 40 mg by mouth daily. Pt takes in the afternoon with her potassium tablet) 90 tablet 3  . levothyroxine (SYNTHROID, LEVOTHROID) 112 MCG tablet Take 112 mcg by mouth daily.    Marland Kitchen loratadine (CLARITIN) 10 MG tablet Take 10 mg by mouth daily.    Marland Kitchen lovastatin (MEVACOR) 20 MG tablet Take 20 mg by mouth at bedtime.    . metoprolol succinate (TOPROL-XL) 50 MG 24 hr tablet Take 50 mg by mouth at bedtime. Take with or immediately following a meal.    . mometasone-formoterol (DULERA) 100-5 MCG/ACT AERO Inhale 2 puffs into the lungs 2 (two) times daily.    . montelukast (SINGULAIR) 10 MG tablet Take 10 mg by mouth at bedtime.    . Omega-3 Fatty Acids (FISH OIL) 1000 MG CAPS Take 1,000 mg by mouth 3 (three) times daily.    Marland Kitchen omeprazole (PRILOSEC) 40 MG capsule Take 40 mg by mouth 2 (two) times daily.     . polyethylene glycol (MIRALAX / GLYCOLAX) packet Take 8.5 g by mouth every other day.    . potassium chloride (K-DUR) 10 MEQ tablet Take 1 tablet (10 mEq total) by mouth daily. (Patient taking differently: Take 10 mEq by mouth daily. Pt takes in the afternoon with her lasix) 90 tablet 3  . sodium chloride (OCEAN)  0.65 % SOLN nasal spray Place 1 spray into both nostrils 2 (two) times daily.    . traMADol (ULTRAM) 50 MG tablet Take 1 tablet (50 mg total) by mouth every 6 (six) hours as needed (mild pain). 30 tablet 0   No current facility-administered medications for this visit.     Patient Active Problem List   Diagnosis Date Noted  . Trigger thumb, left thumb 02/10/2019  . Numbness and tingling in right hand 02/10/2019  .  Pain in left foot 02/10/2019  . Closed fracture of right proximal tibia 06/22/2018  . Paroxysmal atrial fibrillation ([place]) 05/29/2018  . Hypokalemia 05/29/2018  . Pain and swelling of right lower extremity 05/28/2018  . SAH (subarachnoid hemorrhage) ([place]) 04/02/2018  . Chronic bilateral low back pain with bilateral sciatica 03/08/2018  . Abnormal CT scan, chest 08/08/2016  . Near syncope 01/06/2015  . Left leg weakness 01/06/2015  . Hyperlipidemia   . Hypothyroidism   . Ventricular ectopy   . Chest pain 05/22/2014  . Left bundle branch block 05/22/2014  . PVC's (premature ventricular contractions) 05/22/2014  . Asthma 02/14/2010  . HOARSENESS 02/14/2010  . CHEST PAIN, ATYPICAL 02/14/2010  . ALLERGIC RHINITIS 02/13/2010  . G E R D 02/13/2010   Past Medical History:  Diagnosis Date  . Arthritis   . Asthma   . GERD (gastroesophageal reflux disease)   . High cholesterol   . Hypothyroidism   . LBBB (left bundle branch block)   . Ventricular ectopy     Family History  Problem Relation Age of Onset  . Heart attack Mother   . Throat cancer Father   . Congestive Heart Failure Sister   . Congestive Heart Failure Brother     Past Surgical History:  Procedure Laterality Date  . CARDIOVERSION N/A 11/27/2015   Procedure: CARDIOVERSION;  Surgeon: Josue Hector, MD;  Location: [facility] ENDOSCOPY;  Service: Cardiovascular;  Laterality: N/A;  . KNEE ARTHROSCOPY    . TIBIA IM NAIL INSERTION Right 04/05/2018   Procedure: INTRAMEDULLARY (IM) NAIL TIBIAL;  Surgeon: Nicholes Stairs, MD;  Location: [place];  Service: Orthopedics;  Laterality: Right;   Social History   Occupational History  . Not on file  Tobacco Use  . Smoking status: Passive Smoke Exposure - Never Smoker  . Smokeless tobacco: Never Used  Substance and Sexual Activity  . Alcohol use: No    Alcohol/week: 0.0 standard drinks  . Drug use: No  . Sexual activity: Not on file

## 2019-02-10 NOTE — Addendum Note (Signed)
Addended by: Lendon Collar on: 02/10/2019 12:14 PM   Modules accepted: Orders

## 2019-02-15 NOTE — Progress Notes (Signed)
Patient ID: Samantha [name], female   DOB: 1929/07/16, 85 y.o.   MRN: [id number]     Cardiology Office Note   Date:  02/28/2019   ID:  Samantha [name], DOB Sep 04, 1929, MRN [id number]  PCP:  Lajean Manes, MD  Cardiologist: Katz/ Jenkins Rouge, MD     History of Present Illness:  85 y.o. history of PAF, HTN Hypothyroidism, Atypical chest pain and HLD   This patients CHA2DS2-VASc Score and unadjusted Ischemic Stroke Rate (% per year) is equal to 4.8 % stroke rate/year from a score of 4  Above score calculated as 1 point each if present [CHF, HTN, DM, Vascular=MI/PAD/Aortic Plaque, Age if 65-74, or Female] Above score calculated as 2 points each if present [Age > 75, or Stroke/TIA/TE]  [place] attempted 11/27/15  X 4 would not maintain NSR  Started on amiodarone with conversion However PFTls showed low DLCO and LFTls up with elevated TSH so amiodarone stopped 07/10/16  Recent left  knee effusion drained and Rx with cortisone Dr Durward Fortes  Still painful has not started rehab Has bad lower back and varicose veins as well Multiple ortho issues with left trigger thumb, numbness right hand and pain in left foot   Has been maintained on eliquis with no bleeding issues      Lab Results  Component Value Date   TSH 17.390 (H) 07/14/2016   Lab Results  Component Value Date   ALT 45 (H) 04/02/2018   AST 84 (H) 04/02/2018   ALKPHOS 82 04/02/2018   BILITOT 0.5 04/02/2018     Past Medical History:  Diagnosis Date  . Arthritis   . Asthma   . GERD (gastroesophageal reflux disease)   . High cholesterol   . Hypothyroidism   . LBBB (left bundle branch block)   . Ventricular ectopy     Past Surgical History:  Procedure Laterality Date  . CARDIOVERSION N/A 11/27/2015   Procedure: CARDIOVERSION;  Surgeon: Josue Hector, MD;  Location: [facility] ENDOSCOPY;  Service: Cardiovascular;  Laterality: N/A;  . KNEE ARTHROSCOPY    . TIBIA IM NAIL INSERTION Right 04/05/2018   Procedure: INTRAMEDULLARY (IM)  NAIL TIBIAL;  Surgeon: Nicholes Stairs, MD;  Location: [place];  Service: Orthopedics;  Laterality: Right;     Current Outpatient Medications  Medication Sig Dispense Refill  . albuterol (PROVENTIL HFA;VENTOLIN HFA) 108 (90 Base) MCG/ACT inhaler Inhale 1-2 puffs into the lungs every 6 (six) hours as needed for wheezing or shortness of breath.    Marland Kitchen apixaban (ELIQUIS) 5 MG TABS tablet Take 5 mg by mouth 2 (two) times daily.    Marland Kitchen azelastine (ASTELIN) 0.1 % nasal spray Place 1 spray into both nostrils 2 (two) times daily. Use in each nostril as directed    . Calcium-Magnesium-Zinc (CALCIUM-MAGNESUIUM-ZINC PO) Take 1 tablet by mouth 3 (three) times daily.     . cephALEXin (KEFLEX) 500 MG capsule Take 1 capsule (500 mg total) by mouth every 12 (twelve) hours. 12 capsule 0  . Cholecalciferol (VITAMIN D) 2000 units tablet Take 2,000 Units by mouth daily.    Marland Kitchen doxycycline (VIBRA-TABS) 100 MG tablet Take 1 tablet (100 mg total) by mouth every 12 (twelve) hours. 12 tablet 0  . furosemide (LASIX) 40 MG tablet Take 1 tablet (40 mg total) by mouth daily. (Patient taking differently: Take 40 mg by mouth daily. Pt takes in the afternoon with her potassium tablet) 90 tablet 3  . levothyroxine (SYNTHROID, LEVOTHROID) 112 MCG tablet Take 112 mcg by  mouth daily.    Marland Kitchen loratadine (CLARITIN) 10 MG tablet Take 10 mg by mouth daily.    Marland Kitchen lovastatin (MEVACOR) 20 MG tablet Take 20 mg by mouth at bedtime.    . metoprolol succinate (TOPROL-XL) 50 MG 24 hr tablet Take 50 mg by mouth at bedtime. Take with or immediately following a meal.    . mometasone-formoterol (DULERA) 100-5 MCG/ACT AERO Inhale 2 puffs into the lungs 2 (two) times daily.    . montelukast (SINGULAIR) 10 MG tablet Take 10 mg by mouth at bedtime.    . Omega-3 Fatty Acids (FISH OIL) 1000 MG CAPS Take 1,000 mg by mouth 3 (three) times daily.    Marland Kitchen omeprazole (PRILOSEC) 40 MG capsule Take 40 mg by mouth 2 (two) times daily.     . polyethylene glycol  (MIRALAX / GLYCOLAX) packet Take 8.5 g by mouth every other day.    . potassium chloride (K-DUR) 10 MEQ tablet Take 1 tablet (10 mEq total) by mouth daily. (Patient taking differently: Take 10 mEq by mouth daily. Pt takes in the afternoon with her lasix) 90 tablet 3  . sodium chloride (OCEAN) 0.65 % SOLN nasal spray Place 1 spray into both nostrils 2 (two) times daily.    . traMADol (ULTRAM) 50 MG tablet Take 1 tablet (50 mg total) by mouth every 6 (six) hours as needed (mild pain). 30 tablet 0   No current facility-administered medications for this visit.     Allergies:   Erythromycin, Benzalkonium chloride, Erythromycin, Neosporin plus max st, Neosporin [neomycin-bacitracin zn-polymyx], and Penicillins    Social History:  The patient  reports that she is a non-smoker but has been exposed to tobacco smoke. She has never used smokeless tobacco. She reports that she does not drink alcohol or use drugs.   Family History:  The patient's family history includes Congestive Heart Failure in her brother and sister; Heart attack in her mother; Throat cancer in her father.    ROS: All other systems are reviewed and negative. Unless otherwise mentioned in H&P    PHYSICAL EXAM: Ht 5' 1.75" (1.568 m)   BMI 24.89 kg/m  Affect anxious  Healthy:  appears stated age 38: normal Neck supple with no adenopathy JVP normal no bruits no thyromegaly Lungs clear with no wheezing and good diaphragmatic motion Heart:  S1/S2 no murmur, no rub, gallop or click PMI normal Abdomen: benighn, BS positve, no tenderness, no AAA no bruit.  No HSM or HJR Distal pulses intact with no bruits No edema Neuro non-focal Skin warm and dry Left knee swelling recent drainage  Bilateral varicose veins      Recent Labs: 04/02/2018: ALT 45 05/30/2018: BUN 7; Hemoglobin 9.6; Platelets 269; Potassium 3.9; Sodium 143 05/31/2018: Creatinine, Ser 0.55    Lipid Panel    Component Value Date/Time   CHOL 173  05/23/2014 0437   TRIG 98 05/23/2014 0437   HDL 49 05/23/2014 0437   CHOLHDL 3.5 05/23/2014 0437   VLDL 20 05/23/2014 0437   LDLCALC 104 (H) 05/23/2014 0437      Wt Readings from Last 3 Encounters:  02/10/19 135 lb (61.2 kg)  01/11/19 133 lb (60.3 kg)  05/29/18 139 lb 8.8 oz (63.3 kg)    ECG:   04/04/18  SR rate 93    ASSESSMENT AND PLAN:  1. Hypertension: Well controlled.  Continue current medications and low sodium Dash type diet.    2. Afib: converted with amiodarone but side effects with elevated LFTls ,  TSH and decreased DLCO so stopped In NSR today continue eliquis  3. CHF: better EF 50-55% by TTE 11/12/16 will update echo   4. Chest Pain:  Normal myovue 06/2014  EF 62%    5. Thyroid:  Synthroid increased f/u labs with Dr Felipa Eth last TSH in Epic 17 07/14/16  6. LFT;s likely elevated from amiodarone improved  7. Ortho:  inflammation left knee discussed wearing elasticized sleeve and compression stockings that are less tight for legs    Echo ordered f/u CHF F/U with me in a year if echo ok    Signed, Jenkins Rouge, MD  02/28/2019 2:10 PM    [facility]. [address] [place], [place] [address] Phone: [phone]; Fax: [phone]

## 2019-02-28 ENCOUNTER — Encounter (INDEPENDENT_AMBULATORY_CARE_PROVIDER_SITE_OTHER): Payer: Self-pay

## 2019-02-28 ENCOUNTER — Ambulatory Visit (INDEPENDENT_AMBULATORY_CARE_PROVIDER_SITE_OTHER): Payer: Medicare Other | Admitting: Cardiovascular Disease

## 2019-02-28 ENCOUNTER — Other Ambulatory Visit: Payer: Self-pay

## 2019-02-28 ENCOUNTER — Encounter: Payer: Self-pay | Admitting: Cardiovascular Disease

## 2019-02-28 VITALS — BP 110/68 | HR 101 | Ht 61.75 in | Wt 137.0 lb

## 2019-02-28 DIAGNOSIS — I480 Paroxysmal atrial fibrillation: Secondary | ICD-10-CM | POA: Diagnosis not present

## 2019-02-28 DIAGNOSIS — I5043 Acute on chronic combined systolic (congestive) and diastolic (congestive) heart failure: Secondary | ICD-10-CM | POA: Diagnosis not present

## 2019-02-28 DIAGNOSIS — I10 Essential (primary) hypertension: Secondary | ICD-10-CM | POA: Diagnosis not present

## 2019-02-28 NOTE — Patient Instructions (Signed)

## 2019-03-08 ENCOUNTER — Encounter: Payer: Self-pay | Admitting: Physical Medicine and Rehabilitation

## 2019-03-08 ENCOUNTER — Ambulatory Visit (INDEPENDENT_AMBULATORY_CARE_PROVIDER_SITE_OTHER): Payer: Medicare Other | Admitting: Physical Medicine and Rehabilitation

## 2019-03-08 DIAGNOSIS — R202 Paresthesia of skin: Secondary | ICD-10-CM | POA: Diagnosis not present

## 2019-03-08 NOTE — Progress Notes (Signed)
  Numeric Pain Rating Scale and Functional Assessment Average Pain 5   In the last MONTH (on 0-10 scale) has pain interfered with the following?  1. General activity like being  able to carry out your everyday physical activities such as walking, climbing stairs, carrying groceries, or moving a chair?  Rating(0)     

## 2019-03-09 NOTE — Progress Notes (Signed)
Samantha [name] - 85 y.o. female MRN [id number]  Date of birth: 1930/03/07  Office Visit Note: Visit Date: 03/08/2019 PCP: Lajean Manes, MD Referred by: Lajean Manes, MD  Subjective: Chief Complaint  Patient presents with  . Right Forearm - Pain  . Right Hand - Numbness   HPI: Samantha [name] is a 85 y.o. female who comes in today At the request of Dr. Joni Fears for electrodiagnostic study of both upper limbs.  Patient is right-hand dominant with chronic worsening pain in the right forearm aching type pain with numbness in the tip of the third finger on the right hand.  Starting any little bit of numbness on the left hand.  Wears braces on both hands at night.  She has no radicular type symptoms.  No history of diabetes but does have a history of hypothyroidism.  She has had no prior electrodiagnostic studies.  ROS Otherwise per HPI.  Assessment & Plan: Visit Diagnoses:  1. Paresthesia of skin     Plan: Impression: The above electrodiagnostic study is ABNORMAL and reveals evidence of a mild to moderate right median nerve entrapment at the wrist (carpal tunnel syndrome) affecting sensory components.   There is no significant electrodiagnostic evidence of any other focal nerve entrapment, brachial plexopathy or cervical radiculopathy.   Recommendations: 1.  Follow-up with referring physician. 2.  Continue current management of symptoms. 3.  Continue use of resting splint at night-time and as needed during the day.  Meds & Orders: No orders of the defined types were placed in this encounter.   Orders Placed This Encounter  Procedures  . NCV with EMG (electromyography)    Follow-up: Return for Joni Fears, MD as scheduled.   Procedures: No procedures performed  EMG & NCV Findings: Evaluation of the left median motor nerve showed reduced amplitude (2.2 mV).  The right median motor nerve showed prolonged distal onset latency (4.4 ms) and reduced amplitude (1.7 mV).   The left median (across palm) sensory and the right median (across palm) sensory nerves showed prolonged distal peak latency (Wrist, L3.7, R4.8 ms) and prolonged distal peak latency (Palm, L2.1, R2.4 ms).  All remaining nerves (as indicated in the following tables) were within normal limits.  All left vs. right side differences were within normal limits.    All examined muscles (as indicated in the following table) showed no evidence of electrical instability.    Impression: The above electrodiagnostic study is ABNORMAL and reveals evidence of a mild to moderate right median nerve entrapment at the wrist (carpal tunnel syndrome) affecting sensory components.   There is no significant electrodiagnostic evidence of any other focal nerve entrapment, brachial plexopathy or cervical radiculopathy.   Recommendations: 1.  Follow-up with referring physician. 2.  Continue current management of symptoms. 3.  Continue use of resting splint at night-time and as needed during the day.  ___________________________ Wonda Olds Board Certified, American Board of Physical Medicine and Rehabilitation    Nerve Conduction Studies Anti Sensory Summary Table   Stim Site NR Peak (ms) Norm Peak (ms) P-T Amp (V) Norm P-T Amp Site1 Site2 Delta-P (ms) Dist (cm) Vel (m/s) Norm Vel (m/s)  Left Median Acr Palm Anti Sensory (2nd Digit)  30.4C  Wrist    *3.7 <3.6 14.9 >10 Wrist Palm 1.6 0.0    Palm    *2.1 <2.0 23.8         Right Median Acr Palm Anti Sensory (2nd Digit)  31.8C  Wrist    *  4.8 <3.6 10.7 >10 Wrist Palm 2.4 0.0    Palm    *2.4 <2.0 17.9         Right Radial Anti Sensory (Base 1st Digit)  30.9C  Wrist    2.6 <3.1 12.1  Wrist Base 1st Digit 2.6 0.0    Right Ulnar Anti Sensory (5th Digit)  31.4C  Wrist    3.5 <3.7 16.2 >15.0 Wrist 5th Digit 3.5 14.0 40 >38   Motor Summary Table   Stim Site NR Onset (ms) Norm Onset (ms) O-P Amp (mV) Norm O-P Amp Site1 Site2 Delta-0 (ms) Dist (cm) Vel (m/s)  Norm Vel (m/s)  Left Median Motor (Abd Poll Brev)  30.2C  Wrist    3.8 <4.2 *2.2 >5 Elbow Wrist 3.0 15.5 52 >50  Elbow    6.8  4.6         Right Median Motor (Abd Poll Brev)  30.8C  Wrist    *4.4 <4.2 *1.7 >5 Elbow Wrist 3.3 17.5 53 >50  Elbow    7.7  5.8         Right Ulnar Motor (Abd Dig Min)  30.6C  Wrist    3.3 <4.2 7.5 >3 B Elbow Wrist 2.9 16.0 55 >53  B Elbow    6.2  6.8  A Elbow B Elbow 1.1 9.0 82 >53  A Elbow    7.3  6.8          EMG   Side Muscle Nerve Root Ins Act Fibs Psw Amp Dur Poly Recrt Int Fraser Din Comment  Right Abd Poll Brev Median C8-T1 Nml Nml Nml Nml Nml 0 Nml Nml   Right 1stDorInt Ulnar C8-T1 Nml Nml Nml Nml Nml 0 Nml Nml   Right PronatorTeres Median C6-7 Nml Nml Nml Nml Nml 0 Nml Nml   Right Biceps Musculocut C5-6 Nml Nml Nml Nml Nml 0 Nml Nml   Right Deltoid Axillary C5-6 Nml Nml Nml Nml Nml 0 Nml Nml     Nerve Conduction Studies Anti Sensory Left/Right Comparison   Stim Site L Lat (ms) R Lat (ms) L-R Lat (ms) L Amp (V) R Amp (V) L-R Amp (%) Site1 Site2 L Vel (m/s) R Vel (m/s) L-R Vel (m/s)  Median Acr Palm Anti Sensory (2nd Digit)  30.4C  Wrist *3.7 *4.8 1.1 14.9 10.7 28.2 Wrist Palm     Palm *2.1 *2.4 0.3 23.8 17.9 24.8       Radial Anti Sensory (Base 1st Digit)  30.9C  Wrist  2.6   12.1  Wrist Base 1st Digit     Ulnar Anti Sensory (5th Digit)  31.4C  Wrist  3.5   16.2  Wrist 5th Digit  40    Motor Left/Right Comparison   Stim Site L Lat (ms) R Lat (ms) L-R Lat (ms) L Amp (mV) R Amp (mV) L-R Amp (%) Site1 Site2 L Vel (m/s) R Vel (m/s) L-R Vel (m/s)  Median Motor (Abd Poll Brev)  30.2C  Wrist 3.8 *4.4 0.6 *2.2 *1.7 22.7 Elbow Wrist 52 53 1  Elbow 6.8 7.7 0.9 4.6 5.8 20.7       Ulnar Motor (Abd Dig Min)  30.6C  Wrist  3.3   7.5  B Elbow Wrist  55   B Elbow  6.2   6.8  A Elbow B Elbow  82   A Elbow  7.3   6.8           Waveforms:  Clinical History: No specialty comments available.   She reports that she is a  non-smoker but has been exposed to tobacco smoke. She has never used smokeless tobacco. No results for input(s): HGBA1C, LABURIC in the last 8760 hours.  Objective:  VS:  HT:    WT:   BMI:     BP:   HR: bpm  TEMP: ( )  RESP:  Physical Exam Musculoskeletal:        General: No swelling, tenderness or deformity.     Comments: Inspection reveals no atrophy of the bilateral APB or FDI or hand intrinsics. There is no swelling, color changes, allodynia or dystrophic changes. There is 5 out of 5 strength in the bilateral wrist extension, finger abduction and long finger flexion. There is intact sensation to light touch in all dermatomal and peripheral nerve distributions. There is a negative Hoffmann's test bilaterally.  Skin:    General: Skin is warm and dry.     Findings: No erythema or rash.  Neurological:     General: No focal deficit present.     Mental Status: She is alert and oriented to person, place, and time.     Motor: No weakness or abnormal muscle tone.     Coordination: Coordination normal.  Psychiatric:        Mood and Affect: Mood normal.        Behavior: Behavior normal.     Ortho Exam Imaging: No results found.  Past Medical/Family/Surgical/Social History: Medications & Allergies reviewed per EMR, new medications updated. Patient Active Problem List   Diagnosis Date Noted  . Trigger thumb, left thumb 02/10/2019  . Numbness and tingling in right hand 02/10/2019  . Pain in left foot 02/10/2019  . Closed fracture of right proximal tibia 06/22/2018  . Paroxysmal atrial fibrillation ([place]) 05/29/2018  . Hypokalemia 05/29/2018  . Pain and swelling of right lower extremity 05/28/2018  . SAH (subarachnoid hemorrhage) ([place]) 04/02/2018  . Chronic bilateral low back pain with bilateral sciatica 03/08/2018  . Abnormal CT scan, chest 08/08/2016  . Near syncope 01/06/2015  . Left leg weakness 01/06/2015  . Hyperlipidemia   . Hypothyroidism   . Ventricular ectopy   . Chest  pain 05/22/2014  . Left bundle branch block 05/22/2014  . PVC's (premature ventricular contractions) 05/22/2014  . Asthma 02/14/2010  . HOARSENESS 02/14/2010  . CHEST PAIN, ATYPICAL 02/14/2010  . ALLERGIC RHINITIS 02/13/2010  . G E R D 02/13/2010   Past Medical History:  Diagnosis Date  . Arthritis   . Asthma   . GERD (gastroesophageal reflux disease)   . High cholesterol   . Hypothyroidism   . LBBB (left bundle branch block)   . Ventricular ectopy    Family History  Problem Relation Age of Onset  . Heart attack Mother   . Throat cancer Father   . Congestive Heart Failure Sister   . Congestive Heart Failure Brother    Past Surgical History:  Procedure Laterality Date  . CARDIOVERSION N/A 11/27/2015   Procedure: CARDIOVERSION;  Surgeon: Josue Hector, MD;  Location[facility] ENDOSCOPY;  Service: Cardiovascular;  Laterality: N/A;  . KNEE ARTHROSCOPY    . TIBIA IM NAIL INSERTION Right 04/05/2018   Procedure: INTRAMEDULLARY (IM) NAIL TIBIAL;  Surgeon: Nicholes Stairs, MD;  Location: [facility];  Service: Orthopedics;  Laterality: Right;   Social History   Occupational History  . Not on file  Tobacco Use  . Smoking status: Passive Smoke Exposure - Never Smoker  .  Smokeless tobacco: Never Used  Substance and Sexual Activity  . Alcohol use: No    Alcohol/week: 0.0 standard drinks  . Drug use: No  . Sexual activity: Not on file

## 2019-03-09 NOTE — Procedures (Signed)
EMG & NCV Findings: Evaluation of the left median motor nerve showed reduced amplitude (2.2 mV).  The right median motor nerve showed prolonged distal onset latency (4.4 ms) and reduced amplitude (1.7 mV).  The left median (across palm) sensory and the right median (across palm) sensory nerves showed prolonged distal peak latency (Wrist, L3.7, R4.8 ms) and prolonged distal peak latency (Palm, L2.1, R2.4 ms).  All remaining nerves (as indicated in the following tables) were within normal limits.  All left vs. right side differences were within normal limits.    All examined muscles (as indicated in the following table) showed no evidence of electrical instability.    Impression: The above electrodiagnostic study is ABNORMAL and reveals evidence of a mild to moderate right median nerve entrapment at the wrist (carpal tunnel syndrome) affecting sensory components.   There is no significant electrodiagnostic evidence of any other focal nerve entrapment, brachial plexopathy or cervical radiculopathy.   Recommendations: 1.  Follow-up with referring physician. 2.  Continue current management of symptoms. 3.  Continue use of resting splint at night-time and as needed during the day.  ___________________________ Wonda Olds Board Certified, American Board of Physical Medicine and Rehabilitation    Nerve Conduction Studies Anti Sensory Summary Table   Stim Site NR Peak (ms) Norm Peak (ms) P-T Amp (V) Norm P-T Amp Site1 Site2 Delta-P (ms) Dist (cm) Vel (m/s) Norm Vel (m/s)  Left Median Acr Palm Anti Sensory (2nd Digit)  30.4C  Wrist    *3.7 <3.6 14.9 >10 Wrist Palm 1.6 0.0    Palm    *2.1 <2.0 23.8         Right Median Acr Palm Anti Sensory (2nd Digit)  31.8C  Wrist    *4.8 <3.6 10.7 >10 Wrist Palm 2.4 0.0    Palm    *2.4 <2.0 17.9         Right Radial Anti Sensory (Base 1st Digit)  30.9C  Wrist    2.6 <3.1 12.1  Wrist Base 1st Digit 2.6 0.0    Right Ulnar Anti Sensory (5th Digit)   31.4C  Wrist    3.5 <3.7 16.2 >15.0 Wrist 5th Digit 3.5 14.0 40 >38   Motor Summary Table   Stim Site NR Onset (ms) Norm Onset (ms) O-P Amp (mV) Norm O-P Amp Site1 Site2 Delta-0 (ms) Dist (cm) Vel (m/s) Norm Vel (m/s)  Left Median Motor (Abd Poll Brev)  30.2C  Wrist    3.8 <4.2 *2.2 >5 Elbow Wrist 3.0 15.5 52 >50  Elbow    6.8  4.6         Right Median Motor (Abd Poll Brev)  30.8C  Wrist    *4.4 <4.2 *1.7 >5 Elbow Wrist 3.3 17.5 53 >50  Elbow    7.7  5.8         Right Ulnar Motor (Abd Dig Min)  30.6C  Wrist    3.3 <4.2 7.5 >3 B Elbow Wrist 2.9 16.0 55 >53  B Elbow    6.2  6.8  A Elbow B Elbow 1.1 9.0 82 >53  A Elbow    7.3  6.8          EMG   Side Muscle Nerve Root Ins Act Fibs Psw Amp Dur Poly Recrt Int Fraser Din Comment  Right Abd Poll Brev Median C8-T1 Nml Nml Nml Nml Nml 0 Nml Nml   Right 1stDorInt Ulnar C8-T1 Nml Nml Nml Nml Nml 0 Nml Nml   Right PronatorTeres Median  C6-7 Nml Nml Nml Nml Nml 0 Nml Nml   Right Biceps Musculocut C5-6 Nml Nml Nml Nml Nml 0 Nml Nml   Right Deltoid Axillary C5-6 Nml Nml Nml Nml Nml 0 Nml Nml     Nerve Conduction Studies Anti Sensory Left/Right Comparison   Stim Site L Lat (ms) R Lat (ms) L-R Lat (ms) L Amp (V) R Amp (V) L-R Amp (%) Site1 Site2 L Vel (m/s) R Vel (m/s) L-R Vel (m/s)  Median Acr Palm Anti Sensory (2nd Digit)  30.4C  Wrist *3.7 *4.8 1.1 14.9 10.7 28.2 Wrist Palm     Palm *2.1 *2.4 0.3 23.8 17.9 24.8       Radial Anti Sensory (Base 1st Digit)  30.9C  Wrist  2.6   12.1  Wrist Base 1st Digit     Ulnar Anti Sensory (5th Digit)  31.4C  Wrist  3.5   16.2  Wrist 5th Digit  40    Motor Left/Right Comparison   Stim Site L Lat (ms) R Lat (ms) L-R Lat (ms) L Amp (mV) R Amp (mV) L-R Amp (%) Site1 Site2 L Vel (m/s) R Vel (m/s) L-R Vel (m/s)  Median Motor (Abd Poll Brev)  30.2C  Wrist 3.8 *4.4 0.6 *2.2 *1.7 22.7 Elbow Wrist 52 53 1  Elbow 6.8 7.7 0.9 4.6 5.8 20.7       Ulnar Motor (Abd Dig Min)  30.6C  Wrist  3.3   7.5  B Elbow Wrist   55   B Elbow  6.2   6.8  A Elbow B Elbow  82   A Elbow  7.3   6.8           Waveforms:

## 2019-03-11 ENCOUNTER — Telehealth: Payer: Self-pay | Admitting: Orthopaedic Surgery

## 2019-03-11 NOTE — Telephone Encounter (Signed)
Patient called stating she had a nerve conduction study with Dr. Ernestina Patches on 03/08/19.  Patient states Dr. Ernestina Patches told her to follow-up with Dr. Durward Fortes.  Patient is requesting a return call to discuss the results if possible.  Patient was informed that Dr. Durward Fortes was not in the office and would receive the message on Tuesday.

## 2019-03-14 NOTE — Telephone Encounter (Signed)
Please see below.

## 2019-03-15 NOTE — Telephone Encounter (Signed)
Called and discussed results.

## 2019-03-29 ENCOUNTER — Ambulatory Visit: Payer: Medicare Other | Admitting: Orthopaedic Surgery

## 2019-03-30 ENCOUNTER — Other Ambulatory Visit: Payer: Self-pay

## 2019-03-30 ENCOUNTER — Ambulatory Visit: Payer: Medicare Other | Admitting: Podiatry

## 2019-03-30 ENCOUNTER — Encounter: Payer: Self-pay | Admitting: Podiatry

## 2019-03-30 DIAGNOSIS — B351 Tinea unguium: Secondary | ICD-10-CM | POA: Diagnosis not present

## 2019-03-30 DIAGNOSIS — M79676 Pain in unspecified toe(s): Secondary | ICD-10-CM

## 2019-03-30 NOTE — Patient Instructions (Signed)

## 2019-03-31 ENCOUNTER — Ambulatory Visit: Payer: Medicare Other | Admitting: Orthopaedic Surgery

## 2019-04-02 NOTE — Progress Notes (Signed)
Subjective: Samantha [name] is seen today for follow up painful, elongated, thickened toenails 1-5 b/l feet that she cannot cut. Pain interferes with daily activities. Aggravating factor includes wearing enclosed shoe gear and relieved with periodic debridement.  She remains on blood thinner, Eliquis.  Current Outpatient Medications on File Prior to Visit  Medication Sig  . albuterol (PROVENTIL HFA;VENTOLIN HFA) 108 (90 Base) MCG/ACT inhaler Inhale 1-2 puffs into the lungs every 6 (six) hours as needed for wheezing or shortness of breath.  Marland Kitchen apixaban (ELIQUIS) 5 MG TABS tablet Take 5 mg by mouth 2 (two) times daily.  Marland Kitchen azelastine (ASTELIN) 0.1 % nasal spray Place 1 spray into both nostrils 2 (two) times daily. Use in each nostril as directed  . Calcium-Magnesium-Zinc (CALCIUM-MAGNESUIUM-ZINC PO) Take 1 tablet by mouth 3 (three) times daily.   . Cholecalciferol (VITAMIN D) 2000 units tablet Take 2,000 Units by mouth daily.  . Ferrous Sulfate (FER-IRON PO) Take 1 tablet by mouth 3 (three) times a week.  . furosemide (LASIX) 40 MG tablet Take 1 tablet (40 mg total) by mouth daily. (Patient taking differently: Take 40 mg by mouth daily. Pt takes in the afternoon with her potassium tablet)  . Influenza vac split quadrivalent PF (FLUZONE HIGH-DOSE) 0.5 ML injection Fluzone High-Dose 2019-20 (PF) 180 mcg/0.5 mL intramuscular syringe  ADM 0.5ML IM UTD  . levothyroxine (SYNTHROID, LEVOTHROID) 112 MCG tablet Take 112 mcg by mouth daily.  Marland Kitchen loratadine (CLARITIN) 10 MG tablet Take 10 mg by mouth daily.  Marland Kitchen lovastatin (MEVACOR) 20 MG tablet Take 20 mg by mouth at bedtime.  . metoprolol succinate (TOPROL-XL) 50 MG 24 hr tablet Take 50 mg by mouth at bedtime. Take with or immediately following a meal.  . mometasone-formoterol (DULERA) 100-5 MCG/ACT AERO Inhale 2 puffs into the lungs 2 (two) times daily.  . montelukast (SINGULAIR) 10 MG tablet Take 10 mg by mouth at bedtime.  . Omega-3 Fatty Acids (FISH OIL)  1000 MG CAPS Take 1,000 mg by mouth 3 (three) times daily.  Marland Kitchen omeprazole (PRILOSEC) 40 MG capsule Take 40 mg by mouth 2 (two) times daily.   . polyethylene glycol (MIRALAX / GLYCOLAX) packet Take 8.5 g by mouth every other day.  . potassium chloride (K-DUR) 10 MEQ tablet Take 1 tablet (10 mEq total) by mouth daily. (Patient taking differently: Take 10 mEq by mouth daily. Pt takes in the afternoon with her lasix)  . sodium chloride (OCEAN) 0.65 % SOLN nasal spray Place 1 spray into both nostrils 2 (two) times daily.  . traMADol (ULTRAM) 50 MG tablet Take 1 tablet (50 mg total) by mouth every 6 (six) hours as needed (mild pain).   No current facility-administered medications on file prior to visit.      Allergies  Allergen Reactions  . Erythromycin Diarrhea  . Benzalkonium Chloride Rash  . Erythromycin Rash  . Neosporin Plus Max St Rash  . Neosporin [Neomycin-Bacitracin Zn-Polymyx] Rash  . Penicillins Rash    DID THE REACTION INVOLVE: Swelling of the face/tongue/throat, SOB, or low BP? N Sudden or severe rash/hives, skin peeling, or the inside of the mouth or nose? Y Did it require medical treatment? N When did it last happen?Childhood If all above answers are "NO", may proceed with cephalosporin use.      Objective:  Vascular Examination: Capillary refill time immediate b/l.   Dorsalis pedis present b/l.  Posterior tibial pulses present b/l.  Digital hair absent b/l.  Skin temperature gradient WNL b/l.  Dermatological Examination: Skin with normal turgor, texture and tone b/l.  Toenails 1-5 b/l discolored, thick, dystrophic with subungual debris and pain with palpation to nailbeds due to thickness of nails.  Musculoskeletal: Muscle strength 5/5 b/l to all LE muscle groups  Neurological Examination: Protective sensation intact 5/5 with 10 gram monofilament bilaterally.  Epicritic sensation present bilaterally.  Vibratory sensation intact bilaterally.    Assessment: Painful onychomycosis toenails 1-5 b/l  Pt on long term blood thinner  Plan: 1. Toenails 1-5 b/l were debrided in length and girth without iatrogenic bleeding. 2. Patient to continue soft, supportive shoe gear. 3. Patient to report any pedal injuries to medical professional immediately. 4. Follow up 3 months.  5. Patient/POA to call should there be a concern in the interim.

## 2019-04-07 ENCOUNTER — Telehealth: Payer: Self-pay | Admitting: Orthopaedic Surgery

## 2019-04-07 NOTE — Telephone Encounter (Signed)
Patient called wanting to know if she could get a cortisone injection in her right arm as well.  CB#[phone].  Thank you.

## 2019-04-07 NOTE — Telephone Encounter (Signed)
Patient already has appointment for Tuesday to be seen.  She has been wrapping it and has had some relief

## 2019-04-07 NOTE — Telephone Encounter (Signed)
Please advise 

## 2019-04-12 ENCOUNTER — Ambulatory Visit: Payer: Medicare Other | Admitting: Orthopaedic Surgery

## 2019-04-12 ENCOUNTER — Encounter: Payer: Self-pay | Admitting: Orthopaedic Surgery

## 2019-04-12 ENCOUNTER — Other Ambulatory Visit: Payer: Self-pay

## 2019-04-12 VITALS — BP 114/64 | HR 70 | Ht 62.0 in | Wt 137.0 lb

## 2019-04-12 DIAGNOSIS — G5601 Carpal tunnel syndrome, right upper limb: Secondary | ICD-10-CM

## 2019-04-12 DIAGNOSIS — M65312 Trigger thumb, left thumb: Secondary | ICD-10-CM

## 2019-04-12 MED ORDER — LIDOCAINE HCL 1 % IJ SOLN
0.5000 mL | INTRAMUSCULAR | Status: AC | PRN
  Administered 2019-04-12: .5 mL

## 2019-04-12 MED ORDER — METHYLPREDNISOLONE ACETATE 40 MG/ML IJ SUSP
20.0000 mg | INTRAMUSCULAR | Status: AC | PRN
  Administered 2019-04-12: 20 mg

## 2019-04-12 NOTE — Progress Notes (Signed)
Office Visit Note   Patient: Samantha [name]           Date of Birth: 01-10-1930           MRN: [id number] Visit Date: 04/12/2019              Requested by: Lajean Manes, MD [address] [place],  [place] [address] PCP: Lajean Manes, MD   Assessment & Plan: Visit Diagnoses:  1. Trigger thumb, left thumb   2. Carpal tunnel syndrome, right upper limb     Plan: Long discussion regarding left trigger thumb and right carpal tunnel.  EMGs and nerve conduction studies performed by Dr. Ernestina Patches were consistent with moderate right carpal tunnel.  [name] has been wearing bilateral volar wrist splints which help her at night.  Also wearing a splint for her left trigger thumb.  She has had some numbness and tingling predominately on the right hand particularly when she drives a car does repetitive activities.  Feeling a little better wearing a splint on her left thumb with less "catching".  I discussed right carpal tunnel release and or trigger finger release and she will get "back to [place]".  I will inject the trigger thumb today.  Follow-Up Instructions: Return if symptoms worsen or fail to improve.   Orders:  Orders Placed This Encounter  Procedures   Hand/UE Inj: L thumb A1   No orders of the defined types were placed in this encounter.     Procedures: Hand/UE Inj: L thumb A1 for trigger finger on 04/12/2019 1:33 PM Medications: 0.5 mL lidocaine 1 %; 20 mg methylPREDNISolone acetate 40 MG/ML      Clinical Data: No additional findings.   Subjective: Chief Complaint  Patient presents with   Left Hand - Follow-up  Patient presents today for a follow up on her left trigger thumb. She is wearing a splint on her thumb. She had an EMG on 03/08/2019 with Dr.Newton. She is having pain in her right hand as well. The pain radiates up her arm and into part of her hand. She has been wearing a brace which seems to help some.   HPI  Review of Systems  Constitutional:  Negative for fatigue.  HENT: Negative for ear pain.   Eyes: Negative for pain.  Respiratory: Negative for shortness of breath.   Cardiovascular: Negative for leg swelling.  Gastrointestinal: Negative for constipation and diarrhea.  Endocrine: Negative for cold intolerance and heat intolerance.  Genitourinary: Negative for difficulty urinating.  Musculoskeletal: Positive for joint swelling.  Skin: Negative for rash.  Allergic/Immunologic: Negative for food allergies.  Neurological: Negative for weakness.  Hematological: Does not bruise/bleed easily.  Psychiatric/Behavioral: Negative for sleep disturbance.     Objective: Vital Signs: BP 114/64    Pulse 70    Ht 5\' 2"  (1.575 m)    Wt 137 lb (62.1 kg)    BMI 25.06 kg/m   Physical Exam Constitutional:      Appearance: She is well-developed.  Eyes:     Pupils: Pupils are equal, round, and reactive to light.  Pulmonary:     Effort: Pulmonary effort is normal.  Skin:    General: Skin is warm and dry.  Neurological:     Mental Status: She is alert and oriented to person, place, and time.  Psychiatric:        Behavior: Behavior normal.     Ortho Exam awake alert and oriented x3.  Comfortable sitting.  Positive Tinel's  over the median nerve the right wrist.  Good opposition of thumb to little finger.  Full range of motion of fingers.  Some decreased sensibility at the tip of the long finger.  Has had some referred subjective pain along the volar aspect of the forearm.  Full range of motion of elbow.  Can actively trigger the left thumb with a palpable nodule over the metacarpal phalangeal joint region volar surface of thumb.  Skin intact  Specialty Comments:  No specialty comments available.  Imaging: No results found.   PMFS History: Patient Active Problem List   Diagnosis Date Noted   Carpal tunnel syndrome, right upper limb 04/12/2019   Trigger thumb, left thumb 02/10/2019   Numbness and tingling in right hand  02/10/2019   Pain in left foot 02/10/2019   Closed fracture of right proximal tibia 06/22/2018   Paroxysmal atrial fibrillation ([place]) 05/29/2018   Hypokalemia 05/29/2018   Pain and swelling of right lower extremity 05/28/2018   SAH (subarachnoid hemorrhage) ([place]) 04/02/2018   Chronic bilateral low back pain with bilateral sciatica 03/08/2018   Abnormal CT scan, chest 08/08/2016   Near syncope 01/06/2015   Left leg weakness 01/06/2015   Hyperlipidemia    Hypothyroidism    Ventricular ectopy    Chest pain 05/22/2014   Left bundle branch block 05/22/2014   PVC's (premature ventricular contractions) 05/22/2014   Asthma 02/14/2010   HOARSENESS 02/14/2010   CHEST PAIN, ATYPICAL 02/14/2010   ALLERGIC RHINITIS 02/13/2010   G E R D 02/13/2010   Past Medical History:  Diagnosis Date   Arthritis    Asthma    GERD (gastroesophageal reflux disease)    High cholesterol    Hypothyroidism    LBBB (left bundle branch block)    Ventricular ectopy     Family History  Problem Relation Age of Onset   Heart attack Mother    Throat cancer Father    Congestive Heart Failure Sister    Congestive Heart Failure Brother     Past Surgical History:  Procedure Laterality Date   CARDIOVERSION N/A 11/27/2015   Procedure: CARDIOVERSION;  Surgeon: Josue Hector, MD;  Location: [facility] ENDOSCOPY;  Service: Cardiovascular;  Laterality: N/A;   KNEE ARTHROSCOPY     TIBIA IM NAIL INSERTION Right 04/05/2018   Procedure: INTRAMEDULLARY (IM) NAIL TIBIAL;  Surgeon: Nicholes Stairs, MD;  Location: [facility];  Service: Orthopedics;  Laterality: Right;   Social History   Occupational History   Not on file  Tobacco Use   Smoking status: Passive Smoke Exposure - Never Smoker   Smokeless tobacco: Never Used  Substance and Sexual Activity   Alcohol use: No    Alcohol/week: 0.0 standard drinks   Drug use: No   Sexual activity: Not on file

## 2019-05-13 DIAGNOSIS — Z7901 Long term (current) use of anticoagulants: Secondary | ICD-10-CM | POA: Insufficient documentation

## 2019-05-13 DIAGNOSIS — H8111 Benign paroxysmal vertigo, right ear: Secondary | ICD-10-CM | POA: Insufficient documentation

## 2019-05-13 DIAGNOSIS — R040 Epistaxis: Secondary | ICD-10-CM | POA: Insufficient documentation

## 2019-07-01 ENCOUNTER — Encounter: Payer: Self-pay | Admitting: Podiatry

## 2019-07-01 ENCOUNTER — Ambulatory Visit: Payer: Medicare Other | Admitting: Podiatry

## 2019-07-01 ENCOUNTER — Other Ambulatory Visit: Payer: Self-pay

## 2019-07-01 DIAGNOSIS — M79676 Pain in unspecified toe(s): Secondary | ICD-10-CM | POA: Diagnosis not present

## 2019-07-01 DIAGNOSIS — B351 Tinea unguium: Secondary | ICD-10-CM | POA: Diagnosis not present

## 2019-07-01 NOTE — Patient Instructions (Signed)

## 2019-07-02 NOTE — Progress Notes (Signed)
Subjective: Samantha [name] presents today for follow up of painful mycotic nails b/l that are difficult to trim. Pain interferes with ambulation. Aggravating factors include wearing enclosed shoe gear. Pain is relieved with periodic professional debridement.   Allergies  Allergen Reactions  . Erythromycin Diarrhea  . Benzalkonium Chloride Rash  . Erythromycin Rash  . Neosporin Plus Max St Rash  . Neosporin [Neomycin-Bacitracin Zn-Polymyx] Rash  . Penicillins Rash    DID THE REACTION INVOLVE: Swelling of the face/tongue/throat, SOB, or low BP? N Sudden or severe rash/hives, skin peeling, or the inside of the mouth or nose? Y Did it require medical treatment? N When did it last happen?Childhood If all above answers are "NO", may proceed with cephalosporin use.      Objective: There were no vitals filed for this visit.  Vascular Examination:  Capillary refill time to digits immediate b/l, palpable DP pulses b/l, palpable PT pulses b/l, pedal hair absent b/l and skin temperature gradient within normal limits b/l  Dermatological Examination: Pedal skin with normal turgor, texture and tone bilaterally, no open wounds bilaterally, no interdigital macerations bilaterally and toenails 1-5 b/l elongated, dystrophic, thickened, crumbly with subungual debris  Musculoskeletal: Normal muscle strength 5/5 to all lower extremity muscle groups bilaterally and no pain crepitus or joint limitation noted with ROM b/l  Neurological: Protective sensation intact 5/5 intact bilaterally with 10g monofilament b/l and vibratory sensation intact b/l  Assessment: No diagnosis found.   Plan: -Toenails 1-5 b/l were debrided in length and girth without iatrogenic bleeding. -Patient to continue soft, supportive shoe gear daily. -Patient to report any pedal injuries to medical professional immediately. -Patient/POA to call should there be question/concern in the interim.  Return in about 3 months  (around 09/29/2019) for nail trim/ Eliquis.

## 2019-07-05 ENCOUNTER — Ambulatory Visit: Payer: Medicare Other | Admitting: Podiatry

## 2019-08-29 NOTE — Progress Notes (Signed)
Patient ID: Samantha [name], female   DOB: [birth year], 86 y.o.   MRN: [id number]     Cardiology Office Note   Date:  09/01/2019   ID:  Samantha [name], DOB [birth year], MRN [id number]  PCP:  Samantha Manes, MD  Cardiologist: Katz/ Jenkins Rouge, MD     History of Present Illness:  86 y.o. history of PAF, HTN Hypothyroidism, Atypical chest pain and HLD   This patients CHA2DS2-VASc Score and unadjusted Ischemic Stroke Rate (% per year) is equal to 4.8 % stroke rate/year from a score of 4  Above score calculated as 1 point each if present [CHF, HTN, DM, Vascular=MI/PAD/Aortic Plaque, Age if 65-74, or Female] Above score calculated as 2 points each if present [Age > 75, or Stroke/TIA/TE]  [place] attempted 11/27/15  X 4 would not maintain NSR  Started on amiodarone with conversion However PFTls showed low DLCO and LFTls up with elevated TSH so amiodarone stopped 07/10/16  Left  knee effusion drained and Rx with cortisone Dr Samantha [name] 2020 Still painful has not started rehab Has bad lower back and varicose veins as well Multiple ortho issues with left trigger thumb, numbness right hand and pain in left foot   Has been maintained on eliquis Has occasional nose bleeds has seen Samantha [name] in past Son in [place] Deceased son;s wife and kids in [place] still but they all work   Has had vaccine    Lab Results  Component Value Date   TSH 17.390 (H) 07/14/2016   Lab Results  Component Value Date   ALT 45 (H) 04/02/2018   AST 84 (H) 04/02/2018   ALKPHOS 82 04/02/2018   BILITOT 0.5 04/02/2018     Past Medical History:  Diagnosis Date  . Arthritis   . Asthma   . GERD (gastroesophageal reflux disease)   . High cholesterol   . Hypothyroidism   . LBBB (left bundle branch block)   . Ventricular ectopy     Past Surgical History:  Procedure Laterality Date  . CARDIOVERSION N/A 11/27/2015   Procedure: CARDIOVERSION;  Surgeon: Samantha Hector, MD;  Location: [facility] ENDOSCOPY;  Service:  Cardiovascular;  Laterality: N/A;  . KNEE ARTHROSCOPY    . TIBIA IM NAIL INSERTION Right 04/05/2018   Procedure: INTRAMEDULLARY (IM) NAIL TIBIAL;  Surgeon: Samantha Stairs, MD;  Location: [facility];  Service: Orthopedics;  Laterality: Right;     Current Outpatient Medications  Medication Sig Dispense Refill  . albuterol (PROVENTIL HFA;VENTOLIN HFA) 108 (90 Base) MCG/ACT inhaler Inhale 1-2 puffs into the lungs every 6 (six) hours as needed for wheezing or shortness of breath.    Marland Kitchen apixaban (ELIQUIS) 5 MG TABS tablet Take 5 mg by mouth 2 (two) times daily.    Marland Kitchen azelastine (ASTELIN) 0.1 % nasal spray Place 1 spray into both nostrils 2 (two) times daily. Use in each nostril as directed    . Calcium-Magnesium-Zinc (CALCIUM-MAGNESUIUM-ZINC PO) Take 1 tablet by mouth 3 (three) times daily.     . Cholecalciferol (VITAMIN D) 2000 units tablet Take 2,000 Units by mouth daily.    Samantha [name] Sodium (DSS) 100 MG CAPS docusate sodium 100 mg capsule   100 mg by oral route.    . Ferrous Sulfate (FER-IRON PO) Take 1 tablet by mouth 3 (three) times a week.    . furosemide (LASIX) 40 MG tablet Take 1 tablet (40 mg total) by mouth daily. (Patient taking differently: Take 40 mg by mouth daily. Pt takes in the afternoon  with her potassium tablet) 90 tablet 3  . Influenza vac split quadrivalent PF (FLUZONE HIGH-DOSE) 0.5 ML injection Fluzone High-Dose 2019-20 (PF) 180 mcg/0.5 mL intramuscular syringe  ADM 0.5ML IM UTD    . levothyroxine (SYNTHROID, LEVOTHROID) 112 MCG tablet Take 112 mcg by mouth daily.    Marland Kitchen loratadine (CLARITIN) 10 MG tablet Take 10 mg by mouth daily.    Marland Kitchen lovastatin (MEVACOR) 20 MG tablet Take 20 mg by mouth at bedtime.    . metoprolol succinate (TOPROL-XL) 50 MG 24 hr tablet Take 50 mg by mouth at bedtime. Take with or immediately following a meal.    . mometasone-formoterol (DULERA) 100-5 MCG/ACT AERO Inhale 2 puffs into the lungs 2 (two) times daily.    . montelukast (SINGULAIR) 10 MG tablet  Take 10 mg by mouth at bedtime.    . Omega-3 Fatty Acids (FISH OIL) 1000 MG CAPS Take 1,000 mg by mouth 3 (three) times daily.    Marland Kitchen omeprazole (PRILOSEC) 40 MG capsule Take 40 mg by mouth 2 (two) times daily.     . polyethylene glycol (MIRALAX / GLYCOLAX) packet Take 8.5 g by mouth every other day.    . potassium chloride (K-DUR) 10 MEQ tablet Take 1 tablet (10 mEq total) by mouth daily. (Patient taking differently: Take 10 mEq by mouth daily. Pt takes in the afternoon with her lasix) 90 tablet 3  . sodium chloride (OCEAN) 0.65 % SOLN nasal spray Place 1 spray into both nostrils 2 (two) times daily.    . traMADol (ULTRAM) 50 MG tablet Take 1 tablet (50 mg total) by mouth every 6 (six) hours as needed (mild pain). 30 tablet 0  . triamcinolone (KENALOG) 0.025 % ointment Apply 1 application topically 2 (two) times daily.    . vitamin B-12 (CYANOCOBALAMIN) 100 MCG tablet Take 100 mcg by mouth daily.     No current facility-administered medications for this visit.    Allergies:   Erythromycin, Benzalkonium chloride, Erythromycin, Neosporin plus max st, Neosporin [neomycin-bacitracin zn-polymyx], and Penicillins    Social History:  The patient  reports that she is a non-smoker but has been exposed to tobacco smoke. She has never used smokeless tobacco. She reports that she does not drink alcohol or use drugs.   Family History:  The patient's family history includes Congestive Heart Failure in her brother and sister; Heart attack in her mother; Throat cancer in her father.    ROS: All other systems are reviewed and negative. Unless otherwise mentioned in H&P    PHYSICAL EXAM: BP 130/84   Pulse 69   Ht 5\' 2"  (1.575 m)   Wt 148 lb (67.1 kg)   SpO2 93%   BMI 27.07 kg/m  Affect anxious  Healthy:  appears stated age 26: normal Neck supple with no adenopathy JVP normal no bruits no thyromegaly Lungs clear with no wheezing and good diaphragmatic motion Heart:  S1/S2 no murmur, no rub,  gallop or click PMI normal Abdomen: benighn, BS positve, no tenderness, no AAA no bruit.  No HSM or HJR Distal pulses intact with no bruits No edema Neuro non-focal Skin warm and dry Left knee swelling recent drainage  Bilateral varicose veins      Recent Labs: No results found for requested labs within last 8760 hours.    Lipid Panel    Component Value Date/Time   CHOL 173 05/23/2014 0437   TRIG 98 05/23/2014 0437   HDL 49 05/23/2014 0437   CHOLHDL 3.5 05/23/2014 0437  VLDL 20 05/23/2014 0437   LDLCALC 104 (H) 05/23/2014 0437      Wt Readings from Last 3 Encounters:  09/01/19 148 lb (67.1 kg)  04/12/19 137 lb (62.1 kg)  02/28/19 137 lb (62.1 kg)    ECG:   09/01/19 SR poor R wave progression   ASSESSMENT AND PLAN:  1. Hypertension: Well controlled.  Continue current medications and low sodium Dash type diet.    2. Afib: converted with amiodarone but side effects with elevated LFTls , TSH and decreased DLCO so stopped In NSR today continue eliquis  3. CHF: better EF 50-55% by TTE 11/12/16 will update echo   4. Chest Pain:  Normal myovue 06/2014  EF 62%    5. Thyroid:  Synthroid increased f/u labs with Dr Felipa Eth last TSH in Epic 17 07/14/16  6. LFT;s likely elevated from amiodarone improved  7. Ortho:  inflammation left knee discussed wearing elasticized sleeve and compression stockings that are less tight for legs    Echo ordered f/u CHF F/U with me in a year if echo ok    Signed, Jenkins Rouge, MD  09/01/2019 2:48 PM    [facility]. [address], [place], [place] [address] Phone: [phone]; Fax: [phone]

## 2019-09-01 ENCOUNTER — Other Ambulatory Visit: Payer: Self-pay

## 2019-09-01 ENCOUNTER — Ambulatory Visit: Payer: Medicare Other | Admitting: Cardiovascular Disease

## 2019-09-01 ENCOUNTER — Encounter: Payer: Self-pay | Admitting: Cardiovascular Disease

## 2019-09-01 VITALS — BP 130/84 | HR 69 | Ht 62.0 in | Wt 148.0 lb

## 2019-09-01 DIAGNOSIS — I480 Paroxysmal atrial fibrillation: Secondary | ICD-10-CM

## 2019-09-01 NOTE — Patient Instructions (Signed)
Medication Instructions:  °*If you need a refill on your cardiac medications before your next appointment, please call your pharmacy* ° °Lab Work: °If you have labs (blood work) drawn today and your tests are completely normal, you will receive your results only by: °• MyChart Message (if you have MyChart) OR °• A paper copy in the mail °If you have any lab test that is abnormal or we need to change your treatment, we will call you to review the results. ° °Testing/Procedures: °Your physician has requested that you have an echocardiogram. Echocardiography is a painless test that uses sound waves to create images of your heart. It provides your doctor with information about the size and shape of your heart and how well your heart’s chambers and valves are working. This procedure takes approximately one hour. There are no restrictions for this procedure. ° °Follow-Up: °At [facility], you and your health needs are our priority.  As part of our continuing mission to provide you with exceptional heart care, we have created designated Provider Care Teams.  These Care Teams include your primary Cardiologist (physician) and Advanced Practice Providers (APPs -  Physician Assistants and Nurse Practitioners) who all work together to provide you with the care you need, when you need it. ° °We recommend signing up for the patient portal called "MyChart".  Sign up information is provided on this After Visit Summary.  MyChart is used to connect with patients for Virtual Visits (Telemedicine).  Patients are able to view lab/test results, encounter notes, upcoming appointments, etc.  Non-urgent messages can be sent to your provider as well.   °To learn more about what you can do with MyChart, go to [email or web address].   ° °Your next appointment:   °6 month(s) ° °The format for your next appointment:   °In Person ° °Provider:   °You may see Peter Nishan, MD or one of the following Advanced Practice Providers on your  designated Care Team:   °· Lori Gerhardt, NP °· Laura Ingold, NP °· Jill McDaniel, NP ° ° ° °

## 2019-09-14 ENCOUNTER — Ambulatory Visit (HOSPITAL_COMMUNITY): Payer: Medicare Other | Attending: Cardiovascular Disease

## 2019-09-14 ENCOUNTER — Other Ambulatory Visit: Payer: Self-pay

## 2019-09-14 DIAGNOSIS — I480 Paroxysmal atrial fibrillation: Secondary | ICD-10-CM

## 2019-10-03 ENCOUNTER — Encounter: Payer: Self-pay | Admitting: Podiatry

## 2019-10-03 ENCOUNTER — Other Ambulatory Visit: Payer: Self-pay

## 2019-10-03 ENCOUNTER — Ambulatory Visit: Payer: Medicare Other | Admitting: Podiatry

## 2019-10-03 DIAGNOSIS — B351 Tinea unguium: Secondary | ICD-10-CM | POA: Diagnosis not present

## 2019-10-03 DIAGNOSIS — M79676 Pain in unspecified toe(s): Secondary | ICD-10-CM | POA: Diagnosis not present

## 2019-10-03 DIAGNOSIS — I739 Peripheral vascular disease, unspecified: Secondary | ICD-10-CM | POA: Diagnosis not present

## 2019-10-03 DIAGNOSIS — L84 Corns and callosities: Secondary | ICD-10-CM | POA: Diagnosis not present

## 2019-10-03 NOTE — Patient Instructions (Signed)

## 2019-10-03 NOTE — Progress Notes (Addendum)
Subjective: Samantha [name] presents today for follow up of callus(es) right hallux and painful mycotic toenails b/l that are difficult to trim. Pain interferes with ambulation. Aggravating factors include wearing enclosed shoe gear. Pain is relieved with periodic professional debridement.   She voices no new pedal problems on today's visit.  Allergies  Allergen Reactions  . Erythromycin Diarrhea  . Benzalkonium Chloride Rash  . Erythromycin Rash  . Neosporin Plus Max St Rash  . Neosporin [Neomycin-Bacitracin Zn-Polymyx] Rash  . Penicillins Rash    DID THE REACTION INVOLVE: Swelling of the face/tongue/throat, SOB, or low BP? N Sudden or severe rash/hives, skin peeling, or the inside of the mouth or nose? Y Did it require medical treatment? N When did it last happen?Childhood If all above answers are "NO", may proceed with cephalosporin use.      Objective: There were no vitals filed for this visit.  Pt is a pleasant 86 y.o. year old Caucasian female WD, WN in NAD. AAO x 3.   Vascular Examination:  Capillary refill time to digits immediate b/l. Palpable DP pulses b/l. Nonpalpable PT pulses b/l. Pedal hair present b/l. Skin temperature gradient within normal limits b/l. No edema noted b/l.  Dermatological Examination: Pedal skin is thin shiny, atrophic bilaterally. No open wounds bilaterally. No interdigital macerations bilaterally. Toenails 1-5 b/l elongated, dystrophic, thickened, crumbly with subungual debris and tenderness to dorsal palpation. Hyperkeratotic lesion(s) R hallux.  No erythema, no edema, no drainage, no flocculence.  Musculoskeletal: Normal muscle strength 5/5 to all lower extremity muscle groups bilaterally. No gross bony deformities bilaterally. No pain crepitus or joint limitation noted with ROM b/l.  Neurological: Protective sensation intact 5/5 intact bilaterally with 10g monofilament b/l. Vibratory sensation intact b/l.  Assessment: 1. Pain due to  onychomycosis of toenail   2. Callus   3. PAD (peripheral artery disease) (HCC)    Plan: -Toenails 1-5 b/l were debrided in length and girth with sterile nail nippers and dremel without iatrogenic bleeding.  -Callus(es) R hallux pared utilizing sterile scalpel blade without complication or incident. Total number debrided =1. -Patient to continue soft, supportive shoe gear daily. -Patient to report any pedal injuries to medical professional immediately. -Patient/POA to call should there be question/concern in the interim.  Return in about 3 months (around 01/03/2020) for nail trim/ Eliquis.  Marzetta Board, DPM

## 2019-10-25 ENCOUNTER — Telehealth: Payer: Self-pay | Admitting: Emergency Medicine

## 2019-10-25 NOTE — Telephone Encounter (Signed)
I spoke with Cecille Rubin and advised her to have pt call our office for an appointment. She has not been seen since 2019 and would need a follow up. Cecille Rubin will call pt to make her aware. Nothing further is needed.

## 2019-11-07 ENCOUNTER — Encounter: Payer: Self-pay | Admitting: Emergency Medicine

## 2019-11-07 ENCOUNTER — Other Ambulatory Visit: Payer: Self-pay

## 2019-11-07 ENCOUNTER — Ambulatory Visit: Payer: Medicare Other | Admitting: Emergency Medicine

## 2019-11-07 DIAGNOSIS — J45909 Unspecified asthma, uncomplicated: Secondary | ICD-10-CM

## 2019-11-07 NOTE — Progress Notes (Signed)
Subjective:    Patient ID: Samantha [name], female    DOB: 12-Apr-1930, 86 y.o.   MRN: [id number]  HPI 86 year old never smoker with a hx A Fib, GERD, allergic rhinitis, hypothyroidism. She carries a dx of asthma made as an adult. She has been treated with [facility] by Dr Donneta Romberg.   She is referred for evaluation of abnormal PFT and to review CT chest.   She had been on amiodarone for about a year, until 07/10/16 when this was stopped due to decreased DLCO on PFT. I have personally reviewed. This shows normal spirometry, restricted lung volumes, decreased diffusion capacity that does not completely correct when adjusted for Va. A high-resolution CT scan of the chest was done on 07/14/16 that I have reviewed. This shows very subtle scattered groundglass attenuation without any evidence for honeycomb change, traction bronchiectasis or frank scarring. Overall the parenchyma appears to be quite benign. There is a 3 mm right upper lobe nodule, a 53mm cluster of centrilobular nodularity in the medial RML. Both are subtle findings.    She is on [facility], takes reliably. Feels that it does help her some. She describes exertional dyspnea that happens with walking, any kind of chores or housework. She does not wheeze. She does not recall ever having an AE-asthma. She has gained weight -   ROV 09/02/17 --this is a follow-up visit for patient with a history of atrial fibrillation, allergic rhinitis, GERD and probable adult asthma although she has normal spirometry in the past.  She returns today to review her CT scan of the chest which I have reviewed. Shows no significant ILD, resolution of some very mild pulmonary nodular disease. She is on [facility], does not need or use albuterol. She gets less exercise now than she used to.   ROV 11/07/19 --86 year old woman with a history of atrial fibrillation, allergic rhinitis, GERD and suspected mild intermittent asthma. There had been some question of possible ILD, but her Ct chest   04/02/18.  She has been managed on Dulera, has albuterol but almost never uses. No flares. She was getting assistance from [facility], but will no longer be able to do so. Overall doing well.    Review of Systems  Constitutional: Negative for fever and unexpected weight change.  HENT: Negative for congestion, dental problem, ear pain, nosebleeds, postnasal drip, rhinorrhea, sinus pressure, sneezing, sore throat and trouble swallowing.   Eyes: Negative for redness and itching.  Respiratory: Positive for shortness of breath. Negative for cough, chest tightness and wheezing.   Cardiovascular: Negative for palpitations and leg swelling.  Gastrointestinal: Negative for nausea and vomiting.  Genitourinary: Negative for dysuria.  Musculoskeletal: Negative for joint swelling.  Skin: Negative for rash.  Neurological: Negative for headaches.  Hematological: Bruises/bleeds easily.  Psychiatric/Behavioral: Negative for dysphoric mood. The patient is not nervous/anxious.     Past Medical History:  Diagnosis Date  . Arthritis   . Asthma   . GERD (gastroesophageal reflux disease)   . High cholesterol   . Hypothyroidism   . LBBB (left bundle branch block)   . Ventricular ectopy      Family History  Problem Relation Age of Onset  . Heart attack Mother   . Throat cancer Father   . Congestive Heart Failure Sister   . Congestive Heart Failure Brother      Social History   Socioeconomic History  . Marital status: Widowed    Spouse name: Not on file  . Number of children: Not on file  .  Years of education: Not on file  . Highest education level: Not on file  Occupational History  . Not on file  Tobacco Use  . Smoking status: Passive Smoke Exposure - Never Smoker  . Smokeless tobacco: Never Used  Substance and Sexual Activity  . Alcohol use: No    Alcohol/week: 0.0 standard drinks  . Drug use: No  . Sexual activity: Not on file  Other Topics Concern  . Not on file  Social History  Narrative   ** Merged History Encounter **       Social Determinants of Health   Financial Resource Strain:   . Difficulty of Paying Living Expenses:   Food Insecurity:   . Worried About Charity fundraiser in the Last Year:   . Arboriculturist in the Last Year:   Transportation Needs:   . Film/video editor (Medical):   Marland Kitchen Lack of Transportation (Non-Medical):   Physical Activity:   . Days of Exercise per Week:   . Minutes of Exercise per Session:   Stress:   . Feeling of Stress :   Social Connections:   . Frequency of Communication with Friends and Family:   . Frequency of Social Gatherings with Friends and Family:   . Attends Religious Services:   . Active Member of Clubs or Organizations:   . Attends Archivist Meetings:   Marland Kitchen Marital Status:   Intimate Partner Violence:   . Fear of Current or Ex-Partner:   . Emotionally Abused:   Marland Kitchen Physically Abused:   . Sexually Abused:      Allergies  Allergen Reactions  . Erythromycin Diarrhea  . Benzalkonium Chloride Rash  . Erythromycin Rash  . Neosporin Plus Max St Rash  . Neosporin [Neomycin-Bacitracin Zn-Polymyx] Rash  . Penicillins Rash    DID THE REACTION INVOLVE: Swelling of the face/tongue/throat, SOB, or low BP? N Sudden or severe rash/hives, skin peeling, or the inside of the mouth or nose? Y Did it require medical treatment? N When did it last happen?Childhood If all above answers are "NO", may proceed with cephalosporin use.      Outpatient Medications Prior to Visit  Medication Sig Dispense Refill  . albuterol (PROVENTIL HFA;VENTOLIN HFA) 108 (90 Base) MCG/ACT inhaler Inhale 1-2 puffs into the lungs every 6 (six) hours as needed for wheezing or shortness of breath.    Marland Kitchen apixaban (ELIQUIS) 5 MG TABS tablet Take 5 mg by mouth 2 (two) times daily.    . Cholecalciferol (VITAMIN D) 2000 units tablet Take 2,000 Units by mouth daily.    Marland Kitchen denosumab (PROLIA) 60 MG/ML SOSY injection Inject into the  skin.    [name] (DSS) 100 MG CAPS docusate sodium 100 mg capsule   100 mg by oral route.    . Ferrous Sulfate (FER-IRON PO) Take 1 tablet by mouth 3 (three) times a week.    . furosemide (LASIX) 40 MG tablet Take 1 tablet (40 mg total) by mouth daily. (Patient taking differently: Take 40 mg by mouth daily. Pt takes in the afternoon with her potassium tablet) 90 tablet 3  . levothyroxine (SYNTHROID, LEVOTHROID) 112 MCG tablet Take 112 mcg by mouth daily.    Marland Kitchen loratadine (CLARITIN) 10 MG tablet Take 10 mg by mouth daily.    Marland Kitchen lovastatin (MEVACOR) 20 MG tablet Take 20 mg by mouth at bedtime.    . metoprolol succinate (TOPROL-XL) 50 MG 24 hr tablet Take 50 mg by mouth at bedtime.  Take with or immediately following a meal.    . mometasone-formoterol (DULERA) 100-5 MCG/ACT AERO Inhale 2 puffs into the lungs 2 (two) times daily.    . montelukast (SINGULAIR) 10 MG tablet Take 10 mg by mouth at bedtime.    . Omega-3 Fatty Acids (FISH OIL) 1000 MG CAPS Take 1,000 mg by mouth 3 (three) times daily.    Marland Kitchen omeprazole (PRILOSEC) 40 MG capsule Take 40 mg by mouth 2 (two) times daily.     . polyethylene glycol (MIRALAX / GLYCOLAX) packet Take 8.5 g by mouth every other day.    . potassium chloride (K-DUR) 10 MEQ tablet Take 1 tablet (10 mEq total) by mouth daily. (Patient taking differently: Take 10 mEq by mouth daily. Pt takes in the afternoon with her lasix) 90 tablet 3  . sodium chloride (OCEAN) 0.65 % SOLN nasal spray Place 1 spray into both nostrils 2 (two) times daily.    . traMADol (ULTRAM) 50 MG tablet Take 1 tablet (50 mg total) by mouth every 6 (six) hours as needed (mild pain). 30 tablet 0  . triamcinolone (KENALOG) 0.025 % ointment Apply 1 application topically 2 (two) times daily.    . vitamin B-12 (CYANOCOBALAMIN) 100 MCG tablet Take 100 mcg by mouth daily.    Marland Kitchen azelastine (ASTELIN) 0.1 % nasal spray Place 1 spray into both nostrils 2 (two) times daily. Use in each nostril as directed      . Calcium-Magnesium-Zinc (CALCIUM-MAGNESUIUM-ZINC PO) Take 1 tablet by mouth 3 (three) times daily.     . Influenza vac split quadrivalent PF (FLUZONE HIGH-DOSE) 0.5 ML injection Fluzone High-Dose 2019-20 (PF) 180 mcg/0.5 mL intramuscular syringe  ADM 0.5ML IM UTD     No facility-administered medications prior to visit.        Objective:   Physical Exam Vitals:   11/07/19 1452  BP: 120/62  Pulse: 77  Temp: 98.2 F (36.8 C)  TempSrc: Oral  SpO2: 98%  Weight: 149 lb 12.8 oz (67.9 kg)  Height: 5\' 2"  (1.575 m)   Gen: Pleasant, elderly overwt, in no distress,  normal affect  ENT: No lesions,  mouth clear,  oropharynx clear, no postnasal drip  Neck: No JVD, no stridor  Lungs: No use of accessory muscles, clear without rales or rhonchi, no crackles  Cardiovascular: irregular,  no peripheral edema  Musculoskeletal: No deformities, no cyanosis or clubbing  Neuro: alert, non focal  Skin: Warm, no lesions or rashes       Assessment & Plan:  Asthma Continue your Dulera 2 puffs twice a day for now.  Rinse and gargle after using. We will consult with our clinical pharmacist about the cost of changing to Symbicort 160/4.5 mcg, 2 puffs twice a day.  We will also look into whether we can obtain financial assistance from the manufacturer.  If Symbicort is going to be cost effective then we will make the change to this medication. Keep your albuterol available to use if needed for shortness of breath Follow with Dr. Lamonte Sakai in 2 months or sooner if you have any problems.  Baltazar Apo, MD, PhD 11/07/2019, 3:10 PM [facility] [phone] or if no answer [phone]

## 2019-11-07 NOTE — Patient Instructions (Signed)
Continue your Dulera 2 puffs twice a day for now.  Rinse and gargle after using. We will consult with our clinical pharmacist about the cost of changing to Symbicort 160/4.5 mcg, 2 puffs twice a day.  We will also look into whether we can obtain financial assistance from the manufacturer.  If Symbicort is going to be cost effective then we will make the change to this medication. Keep your albuterol available to use if needed for shortness of breath Follow with Dr. Lamonte Sakai in 2 months or sooner if you have any problems.

## 2019-11-07 NOTE — Assessment & Plan Note (Signed)
Continue your Dulera 2 puffs twice a day for now.  Rinse and gargle after using. We will consult with our clinical pharmacist about the cost of changing to Symbicort 160/4.5 mcg, 2 puffs twice a day.  We will also look into whether we can obtain financial assistance from the manufacturer.  If Symbicort is going to be cost effective then we will make the change to this medication. Keep your albuterol available to use if needed for shortness of breath Follow with Dr. Lamonte Sakai in 2 months or sooner if you have any problems.

## 2019-11-15 NOTE — Progress Notes (Signed)
HPI Patient of Dr. Lamonte Sakai presents today to [facility] to see pharmacy team for financial assistance.  Past medical history includes LBBB, PVCs, paroxysmal Afib, allergic rhinitis, asthma, GERD, hypothyroidism, carpal tunnel syndrome (right upper limb), prior hx of subarachnoid hemorrhage (2019), trigger thumb HLD, and angina. At prior appt with Dr. Lamonte Sakai on 11/07/19 cost of Ruthe Mannan was an issue for patient. Dr. Lamonte Sakai recommended to switch patient to Symbicort if covered by insurance.  Patient has two concerns she would like to address today - 1) does she still have asthma 2) can she stop taking any of her medications. She states she has chronic nosebleeds in solely her left nostril. She has seen an ENT provider who advised patient to stop using Azelastine and to start using sodium chloride nasal spray. When she has a nosebleed she will soak a cotton ball in sodium chloride then will stick in her nose - this relieves nosebleed. Her last nosebleed this past Saturday (11/19/19). She states she recently received 90 day supply of Dulera.   Prescription Benefits Investigation 1. Symbicort- $47.00 2. Breo Ellipta-$47.00  3. Airduo Respiclick- not covered 4. Advair HFA $47.00 5. Advair Diskus-$47.00  Number of hospitalizations in past year: never Number of asthma exacerbations in past year: never  Respiratory Medications Current: Dulera 100-5 BID, montelukast 10 mg daily, albuterol prn Tried in past: none  Patient reports no known adherence challenges  OBJECTIVE Allergies  Allergen Reactions  . Erythromycin Diarrhea  . Benzalkonium Chloride Rash  . Erythromycin Rash  . Neosporin Plus Max St Rash  . Neosporin [Neomycin-Bacitracin Zn-Polymyx] Rash  . Penicillins Rash    DID THE REACTION INVOLVE: Swelling of the face/tongue/throat, SOB, or low BP? N Sudden or severe rash/hives, skin peeling, or the inside of the mouth or nose? Y Did it require medical treatment? N When did it last  happen?Childhood If all above answers are "NO", may proceed with cephalosporin use.     Outpatient Encounter Medications as of 11/21/2019  Medication Sig Note  . albuterol (PROVENTIL HFA;VENTOLIN HFA) 108 (90 Base) MCG/ACT inhaler Inhale 1-2 puffs into the lungs every 6 (six) hours as needed for wheezing or shortness of breath.   Marland Kitchen apixaban (ELIQUIS) 5 MG TABS tablet Take 5 mg by mouth 2 (two) times daily.   Marland Kitchen azelastine (ASTELIN) 0.1 % nasal spray Place 1 spray into both nostrils 2 (two) times daily. Use in each nostril as directed   . Calcium-Magnesium-Zinc (CALCIUM-MAGNESUIUM-ZINC PO) Take 1 tablet by mouth 3 (three) times daily.    . Cholecalciferol (VITAMIN D) 2000 units tablet Take 2,000 Units by mouth daily.   Marland Kitchen denosumab (PROLIA) 60 MG/ML SOSY injection Inject into the skin.   [name] (DSS) 100 MG CAPS docusate sodium 100 mg capsule   100 mg by oral route.   . Ferrous Sulfate (FER-IRON PO) Take 1 tablet by mouth 3 (three) times a week.   . furosemide (LASIX) 40 MG tablet Take 1 tablet (40 mg total) by mouth daily. (Patient taking differently: Take 40 mg by mouth daily. Pt takes in the afternoon with her potassium tablet)   . Influenza vac split quadrivalent PF (FLUZONE HIGH-DOSE) 0.5 ML injection Fluzone High-Dose 2019-20 (PF) 180 mcg/0.5 mL intramuscular syringe  ADM 0.5ML IM UTD   . levothyroxine (SYNTHROID, LEVOTHROID) 112 MCG tablet Take 112 mcg by mouth daily.   Marland Kitchen loratadine (CLARITIN) 10 MG tablet Take 10 mg by mouth daily.   Marland Kitchen lovastatin (MEVACOR) 20 MG tablet Take 20  mg by mouth at bedtime.   . metoprolol succinate (TOPROL-XL) 50 MG 24 hr tablet Take 50 mg by mouth at bedtime. Take with or immediately following a meal. 05/28/2018: Pt takes each evening before bed  . mometasone-formoterol (DULERA) 100-5 MCG/ACT AERO Inhale 2 puffs into the lungs 2 (two) times daily.   . montelukast (SINGULAIR) 10 MG tablet Take 10 mg by mouth at bedtime.   . Omega-3 Fatty Acids  (FISH OIL) 1000 MG CAPS Take 1,000 mg by mouth 3 (three) times daily.   Marland Kitchen omeprazole (PRILOSEC) 40 MG capsule Take 40 mg by mouth 2 (two) times daily.    . polyethylene glycol (MIRALAX / GLYCOLAX) packet Take 8.5 g by mouth every other day.   . potassium chloride (K-DUR) 10 MEQ tablet Take 1 tablet (10 mEq total) by mouth daily. (Patient taking differently: Take 10 mEq by mouth daily. Pt takes in the afternoon with her lasix)   . sodium chloride (OCEAN) 0.65 % SOLN nasal spray Place 1 spray into both nostrils 2 (two) times daily.   . traMADol (ULTRAM) 50 MG tablet Take 1 tablet (50 mg total) by mouth every 6 (six) hours as needed (mild pain).   . triamcinolone (KENALOG) 0.025 % ointment Apply 1 application topically 2 (two) times daily.   . vitamin B-12 (CYANOCOBALAMIN) 100 MCG tablet Take 100 mcg by mouth daily.    No facility-administered encounter medications on file as of 11/21/2019.     Immunization History  Administered Date(s) Administered  . Influenza Whole 03/02/2009, 03/02/2010  . Influenza, High Dose Seasonal PF 03/24/2016  . PFIZER SARS-COV-2 Vaccination 07/28/2019, 08/25/2019     PFTs PFT Results Latest Ref Rng & Units 07/09/2016  FVC-Pre L 2.16  FVC-Predicted Pre % 99  FVC-Post L 2.16  FVC-Predicted Post % 99  Pre FEV1/FVC % % 87  Post FEV1/FCV % % 88  FEV1-Pre L 1.87  FEV1-Predicted Pre % 117  FEV1-Post L 1.90  DLCO UNC% % 62  DLCO COR %Predicted % 77  TLC L 3.65  TLC % Predicted % 76  RV % Predicted % 59     Eosinophils Most recent blood eosinophil count was 200 cells/microL taken on 05/28/2018.   IgE - N/A   Assessment   1. Inhaler Optimization  Optimal inhaler for patient would be to continue [facility] (patient reports efficacy). However, patient is interested in decreasing / discontinuing inhaler use if possible. Recommend patient decrease to Dulera 1 puff twice daily instead of 2 puffs twice daily. Considering Dulera may be discontinued - if patient  requires additional ICS/LABA when tapering Dulera then can switch patient to Symbicort maintenance dose OR if patient feels comfortable discontinuing Dulera then can switch to Symbicort prn use (rescue inhaler) per GINA guidelines. Advised patient to follow up with Dr. Lamonte Sakai (next appt 01/2020) or pharmacy team to determine further asthma management. Informed her of Symbicort copay.  Reviewed appropriate use of maintenance vs rescue inhalers.  Stressed importance of using maintenance inhaler daily and rescue inhaler only as needed.  Patient verbalized understanding.  2. Medication Reconciliation  A drug regimen assessment was performed, including review of allergies, interactions, disease-state management, dosing and immunization history. Medications were reviewed with the patient, including name, instructions, indication, goals of therapy, potential side effects, importance of adherence, and safe use.  Drug interaction(s): no concerning interactions identified  3. Immunizations  Patient is indicated for the pneumonia and shingles vaccinations. She reports she has received her pneumonia and shingles in the past.  She states Dr. Felipa Eth has her complete immunization records.   4. Medication Optimization It appears majority of medications patient is taking are necessary. Explained to patient she can consider tapering off of omeprazole or switch from omeprazole to famotidine (decrease risk of c diff and worsening of osteoporosis). Patient politely declined as she is interested in continuing omeprazole and finds efficacious for GERD management.  PLAN 1. DECREASE Dulera from 2 puffs twice daily to 1 puff twice daily.  All questions encouraged and answered.  Instructed patient to reach out with any further questions or concerns.  Thank you for allowing pharmacy to participate in this patient's care.  This appointment required 45 minutes of patient care (this includes precharting, chart review,  review of results, face-to-face care, etc.).  Drexel Iha, PharmD PGY2 Ambulatory Care Pharmacy Resident

## 2019-11-21 ENCOUNTER — Ambulatory Visit (INDEPENDENT_AMBULATORY_CARE_PROVIDER_SITE_OTHER): Payer: Medicare Other | Admitting: Pharmacist

## 2019-11-21 ENCOUNTER — Other Ambulatory Visit: Payer: Self-pay

## 2019-11-21 DIAGNOSIS — J45909 Unspecified asthma, uncomplicated: Secondary | ICD-10-CM

## 2019-11-21 NOTE — Patient Instructions (Addendum)
It was a pleasure seeing you in clinic today!  Today the plan is... 1. Please follow up with ENT doctor if 1) sodium chloride nasal spray stops working to control nosebleeds 2) if it takes longer than 15 minutes to control nosebleed 3) nosebleeds start happening more frequently 2. START Dulera ONE puff TWICE daily instead of TWO puffs twice daily. Let Dr. Lamonte Sakai know how well that is working for you. Please contact [facility] to get an appointment with Dr. Lamonte Sakai or pharmacy team  3. Try to get nose bridge pad at [facility] to help with glasses fogging when wearing mask  Please call the PharmD clinic at [phone] if you have any questions that you would like to speak with a pharmacist about Samantha [name], Museum/gallery conservator).

## 2020-01-02 ENCOUNTER — Ambulatory Visit: Payer: Medicare Other | Admitting: Podiatry

## 2020-01-02 ENCOUNTER — Other Ambulatory Visit: Payer: Self-pay

## 2020-01-02 DIAGNOSIS — B351 Tinea unguium: Secondary | ICD-10-CM | POA: Diagnosis not present

## 2020-01-02 DIAGNOSIS — M21622 Bunionette of left foot: Secondary | ICD-10-CM | POA: Diagnosis not present

## 2020-01-02 DIAGNOSIS — M79672 Pain in left foot: Secondary | ICD-10-CM | POA: Diagnosis not present

## 2020-01-02 DIAGNOSIS — M21621 Bunionette of right foot: Secondary | ICD-10-CM

## 2020-01-02 DIAGNOSIS — L84 Corns and callosities: Secondary | ICD-10-CM

## 2020-01-02 DIAGNOSIS — M79676 Pain in unspecified toe(s): Secondary | ICD-10-CM

## 2020-01-02 DIAGNOSIS — I739 Peripheral vascular disease, unspecified: Secondary | ICD-10-CM | POA: Diagnosis not present

## 2020-01-02 DIAGNOSIS — M79671 Pain in right foot: Secondary | ICD-10-CM

## 2020-01-02 NOTE — Patient Instructions (Addendum)
You may purchase non-medicated felt callus pads on [place] in bulk.  I have given you a sample of this pad. These are one-time use only.   If you want reusable callus pads, look for Dr. Marlaine Hind reusable horseshoe pads. They are made of silicone. They are blue and you can reuse these daily. They are more expensive, but again, they are reusable.

## 2020-01-03 ENCOUNTER — Encounter: Payer: Self-pay | Admitting: Podiatry

## 2020-01-03 NOTE — Progress Notes (Signed)
Subjective: Samantha [name] presents today for follow up of callus(es) right hallux and painful mycotic toenails b/l that are difficult to trim. Pain interferes with ambulation. Aggravating factors include wearing enclosed shoe gear. Pain is relieved with periodic professional debridement.   Patient states that she is having discomfort on the outside of both feet.  She points to the fifth metatarsal of both feet.  She denies any episodes of trauma.  PCP is Dr. Lajean Manes.  Last visit Oct 10, 2019.  Allergies  Allergen Reactions  . Erythromycin Diarrhea  . Benzalkonium Chloride Rash  . Erythromycin Rash  . Neosporin Plus Max St Rash  . Neosporin [Neomycin-Bacitracin Zn-Polymyx] Rash  . Penicillins Rash    DID THE REACTION INVOLVE: Swelling of the face/tongue/throat, SOB, or low BP? N Sudden or severe rash/hives, skin peeling, or the inside of the mouth or nose? Y Did it require medical treatment? N When did it last happen?Childhood If all above answers are "NO", may proceed with cephalosporin use.      Objective: There were no vitals filed for this visit.  Pt is a pleasant 86 y.o. year old Caucasian female WD, WN in NAD. AAO x 3.   Vascular Examination:  Capillary refill time to digits immediate b/l. Palpable DP pulses b/l. Nonpalpable PT pulses b/l. Pedal hair present b/l. Skin temperature gradient within normal limits b/l. No edema noted b/l.  Dermatological Examination: Pedal skin is thin shiny, atrophic b/l lower extremities. No open wounds bilaterally. No interdigital macerations bilaterally. Toenails 1-5 b/l elongated, discolored, dystrophic, thickened, crumbly with subungual debris and tenderness to dorsal palpation. Hyperkeratotic lesion(s) submet head 1 right foot.  No erythema, no edema, no drainage, no flocculence.  Musculoskeletal: Normal muscle strength 5/5 to all lower extremity muscle groups bilaterally. No pain crepitus or joint limitation noted with ROM b/l.  Tailor's bunion deformity noted b/l lower extremities with tenderness to palpation.  Neurological: Protective sensation intact 5/5 intact bilaterally with 10g monofilament b/l. Vibratory sensation intact b/l.  Assessment: 1. Pain due to onychomycosis of toenail   2. Callus   3. Tailor's bunion of both feet   4. PAD (peripheral artery disease) (HCC)     Plan: -Toenails 1-5 b/l were debrided in length and girth with sterile nail nippers and dremel without iatrogenic bleeding.  -Callus(es) submet head 1 right foot pared utilizing sterile scalpel blade without complication or incident. Total number debrided =1.  Applied felt aperture pad submet head 1 right foot.  Patient given information on purchasing nonmedicated felt callus pads and bulk on [place].  She was also given information on Dr. Marlaine Hind reusable horseshoe pads. -Patient to report any pedal injuries to medical professional immediately. -Discussed Tyler's bunion and treatment options.  Dispensed Tailor's bunion shields bilaterally. Apply every morning. Remove every evening. -Patient to continue soft, supportive shoe gear daily. -Patient/POA to call should there be question/concern in the interim.  Return in about 3 months (around 04/03/2020) for nail and callus trim.  Marzetta Board, DPM

## 2020-01-09 ENCOUNTER — Ambulatory Visit: Payer: Medicare Other | Admitting: Emergency Medicine

## 2020-01-09 ENCOUNTER — Other Ambulatory Visit: Payer: Self-pay

## 2020-01-09 ENCOUNTER — Encounter: Payer: Self-pay | Admitting: Emergency Medicine

## 2020-01-09 DIAGNOSIS — K219 Gastro-esophageal reflux disease without esophagitis: Secondary | ICD-10-CM

## 2020-01-09 DIAGNOSIS — J45909 Unspecified asthma, uncomplicated: Secondary | ICD-10-CM | POA: Diagnosis not present

## 2020-01-09 NOTE — Assessment & Plan Note (Signed)
Continue loratadine, Singulair, this is a contributor to her chronic cough

## 2020-01-09 NOTE — Patient Instructions (Addendum)
We will do a trial off of every day schedule inhaled medications to see how your breathing and cough respond.  Stay off of Dulera (or Symbicort) for about 3 weeks to see if you miss them. If you do miss being on an every day inhaler then we will start Symbicort (as a substitute for [facility]) 2 puffs twice a day.  Rinse and gargle after using. You can use albuterol 2 puffs if needed for shortness of breath, chest tightness, wheezing. Please continue Singulair and loratadine as you have been taking them Continue Protonix 40 mg twice a day. Follow with Dr Lamonte Sakai in 6 months or sooner if you have any problems

## 2020-01-09 NOTE — Assessment & Plan Note (Signed)
Continue Protonix 40 mg twice a day.  This is a contributor to her chronic cough

## 2020-01-09 NOTE — Assessment & Plan Note (Signed)
She is willing to undertake a trial off of scheduled bronchodilator therapy to see if she loses any ground.  If so then we will need to change her Dulera to Symbicort due to an insurance preference.  She has Symbicort so she can try it to see if she tolerates if we decide to get back on schedule therapy.  Continue albuterol as needed

## 2020-01-09 NOTE — Progress Notes (Signed)
Subjective:    Patient ID: Samantha [name], female    DOB: [birth year], 86 y.o.   MRN: [id number]  HPI 86 year old never smoker with a hx A Fib, GERD, allergic rhinitis, hypothyroidism. She carries a dx of asthma made as an adult. She has been treated with [facility] by Dr Donneta Romberg.   She is referred for evaluation of abnormal PFT and to review CT chest.   She had been on amiodarone for about a year, until 07/10/16 when this was stopped due to decreased DLCO on PFT. I have personally reviewed. This shows normal spirometry, restricted lung volumes, decreased diffusion capacity that does not completely correct when adjusted for Va. A high-resolution CT scan of the chest was done on 07/14/16 that I have reviewed. This shows very subtle scattered groundglass attenuation without any evidence for honeycomb change, traction bronchiectasis or frank scarring. Overall the parenchyma appears to be quite benign. There is a 3 mm right upper lobe nodule, a 51mm cluster of centrilobular nodularity in the medial RML. Both are subtle findings.    She is on [facility], takes reliably. Feels that it does help her some. She describes exertional dyspnea that happens with walking, any kind of chores or housework. She does not wheeze. She does not recall ever having an AE-asthma. She has gained weight -   ROV 09/02/17 --this is a follow-up visit for patient with a history of atrial fibrillation, allergic rhinitis, GERD and probable adult asthma although she has normal spirometry in the past.  She returns today to review her CT scan of the chest which I have reviewed. Shows no significant ILD, resolution of some very mild pulmonary nodular disease. She is on [facility], does not need or use albuterol. She gets less exercise now than she used to.   ROV 11/07/19 --86 year old woman with a history of atrial fibrillation, allergic rhinitis, GERD and suspected mild intermittent asthma. There had been some question of possible ILD, but her Ct chest   04/02/18.  She has been managed on Dulera, has albuterol but almost never uses. No flares. She was getting assistance from [place], but will no longer be able to do so. Overall doing well.   ROV 01/09/20 --follow-up visit for 86 year old woman with A. fib, allergic rhinitis, GERD, suspected mild intermittent asthma that is been managed with Dulera.  We question possibly changing to Symbicort for cost reasons insurance coverage >> looks like this will cost $47.  She has albuterol which she uses approximately . She has intermittent cough, prod of gray or yellow mucous. She remains on singulair, loratadine, protonix 40mg  bid. She has tried mucinex without effect.    Review of Systems  Constitutional: Negative for fever and unexpected weight change.  HENT: Negative for congestion, dental problem, ear pain, nosebleeds, postnasal drip, rhinorrhea, sinus pressure, sneezing, sore throat and trouble swallowing.   Eyes: Negative for redness and itching.  Respiratory: Positive for shortness of breath. Negative for cough, chest tightness and wheezing.   Cardiovascular: Negative for palpitations and leg swelling.  Gastrointestinal: Negative for nausea and vomiting.  Genitourinary: Negative for dysuria.  Musculoskeletal: Negative for joint swelling.  Skin: Negative for rash.  Neurological: Negative for headaches.  Hematological: Bruises/bleeds easily.  Psychiatric/Behavioral: Negative for dysphoric mood. The patient is not nervous/anxious.     Past Medical History:  Diagnosis Date  . Arthritis   . Asthma   . GERD (gastroesophageal reflux disease)   . High cholesterol   . Hypothyroidism   . LBBB (left bundle  branch block)   . Ventricular ectopy      Family History  Problem Relation Age of Onset  . Heart attack Mother   . Throat cancer Father   . Congestive Heart Failure Sister   . Congestive Heart Failure Brother      Social History   Socioeconomic History  . Marital status: Widowed    Spouse  name: Not on file  . Number of children: Not on file  . Years of education: Not on file  . Highest education level: Not on file  Occupational History  . Not on file  Tobacco Use  . Smoking status: Passive Smoke Exposure - Never Smoker  . Smokeless tobacco: Never Used  Vaping Use  . Vaping Use: Never used  Substance and Sexual Activity  . Alcohol use: No    Alcohol/week: 0.0 standard drinks  . Drug use: No  . Sexual activity: Not on file  Other Topics Concern  . Not on file  Social History Narrative   ** Merged History Encounter **       Social Determinants of Health   Financial Resource Strain:   . Difficulty of Paying Living Expenses:   Food Insecurity:   . Worried About Charity fundraiser in the Last Year:   . Arboriculturist in the Last Year:   Transportation Needs:   . Film/video editor (Medical):   Marland Kitchen Lack of Transportation (Non-Medical):   Physical Activity:   . Days of Exercise per Week:   . Minutes of Exercise per Session:   Stress:   . Feeling of Stress :   Social Connections:   . Frequency of Communication with Friends and Family:   . Frequency of Social Gatherings with Friends and Family:   . Attends Religious Services:   . Active Member of Clubs or Organizations:   . Attends Archivist Meetings:   Marland Kitchen Marital Status:   Intimate Partner Violence:   . Fear of Current or Ex-Partner:   . Emotionally Abused:   Marland Kitchen Physically Abused:   . Sexually Abused:      Allergies  Allergen Reactions  . Erythromycin Diarrhea  . Benzalkonium Chloride Rash  . Erythromycin Rash  . Neosporin Plus Max St Rash  . Neosporin [Neomycin-Bacitracin Zn-Polymyx] Rash  . Penicillins Rash    DID THE REACTION INVOLVE: Swelling of the face/tongue/throat, SOB, or low BP? N Sudden or severe rash/hives, skin peeling, or the inside of the mouth or nose? Y Did it require medical treatment? N When did it last happen?Childhood If all above answers are "NO", may  proceed with cephalosporin use.      Outpatient Medications Prior to Visit  Medication Sig Dispense Refill  . albuterol (PROVENTIL HFA;VENTOLIN HFA) 108 (90 Base) MCG/ACT inhaler Inhale 1-2 puffs into the lungs every 6 (six) hours as needed for wheezing or shortness of breath.     Marland Kitchen apixaban (ELIQUIS) 5 MG TABS tablet Take 5 mg by mouth 2 (two) times daily.    . Calcium-Magnesium-Zinc (CALCIUM-MAGNESUIUM-ZINC PO) Take 1 tablet by mouth as directed. (Monday, Wednesday, Friday)    . Cholecalciferol (VITAMIN D) 2000 units tablet Take 2,000 Units by mouth daily.    Marland Kitchen denosumab (PROLIA) 60 MG/ML SOSY injection Inject into the skin.    . Ferrous Sulfate (FER-IRON PO) Take 1 tablet by mouth 3 (three) times a week.    . furosemide (LASIX) 40 MG tablet Take 1 tablet (40 mg total) by mouth daily. (Patient  taking differently: Take 40 mg by mouth daily. Pt takes in the afternoon with her potassium tablet) 90 tablet 3  . Influenza vac split quadrivalent PF (FLUZONE HIGH-DOSE) 0.5 ML injection Fluzone High-Dose 2019-20 (PF) 180 mcg/0.5 mL intramuscular syringe  ADM 0.5ML IM UTD    . levothyroxine (SYNTHROID, LEVOTHROID) 112 MCG tablet Take 112 mcg by mouth daily.    Marland Kitchen loratadine (CLARITIN) 10 MG tablet Take 10 mg by mouth daily.    Marland Kitchen lovastatin (MEVACOR) 20 MG tablet Take 20 mg by mouth at bedtime.    . metoprolol succinate (TOPROL-XL) 50 MG 24 hr tablet Take 50 mg by mouth at bedtime. Take with or immediately following a meal.    . mometasone-formoterol (DULERA) 100-5 MCG/ACT AERO Inhale 1 puff into the lungs 2 (two) times daily.     . montelukast (SINGULAIR) 10 MG tablet Take 10 mg by mouth at bedtime.    . Multiple Vitamins-Minerals (PRESERVISION AREDS PO) Take 1 tablet by mouth 2 (two) times daily.    Marland Kitchen ofloxacin (FLOXIN) 0.3 % OTIC solution SMARTSIG:4 Drop(s) Left Ear Twice Daily    . Omega-3 Fatty Acids (FISH OIL) 1000 MG CAPS Take 1,000 mg by mouth 3 (three) times daily.    Marland Kitchen omeprazole (PRILOSEC)  40 MG capsule Take 40 mg by mouth 2 (two) times daily.     . polyethylene glycol (MIRALAX / GLYCOLAX) packet Take 8.5 g by mouth every other day.    . potassium chloride (K-DUR) 10 MEQ tablet Take 1 tablet (10 mEq total) by mouth daily. (Patient taking differently: Take 10 mEq by mouth daily. Pt takes in the afternoon with her lasix) 90 tablet 3  . sodium chloride (OCEAN) 0.65 % SOLN nasal spray Place 1 spray into both nostrils 2 (two) times daily.    Marland Kitchen triamcinolone (KENALOG) 0.025 % ointment Apply 1 application topically 2 (two) times daily.    . vitamin B-12 (CYANOCOBALAMIN) 100 MCG tablet Take 100 mcg by mouth as directed. Mon, wed, fri     No facility-administered medications prior to visit.        Objective:   Physical Exam Vitals:   01/09/20 1516  BP: 128/64  Pulse: 75  Temp: (!) 97.1 F (36.2 C)  TempSrc: Temporal  SpO2: 98%  Weight: 150 lb (68 kg)  Height: 5\' 2"  (1.575 m)   Gen: Pleasant, elderly overwt, in no distress,  normal affect  ENT: No lesions,  mouth clear,  oropharynx clear, no postnasal drip  Neck: No JVD, no stridor  Lungs: No use of accessory muscles, clear without rales or rhonchi, no crackles  Cardiovascular: irregular,  no peripheral edema  Musculoskeletal: No deformities, no cyanosis or clubbing  Neuro: alert, non focal  Skin: Warm, no lesions or rashes       Assessment & Plan:  Asthma She is willing to undertake a trial off of scheduled bronchodilator therapy to see if she loses any ground.  If so then we will need to change her Dulera to Symbicort due to an insurance preference.  She has Symbicort so she can try it to see if she tolerates if we decide to get back on schedule therapy.  Continue albuterol as needed  G E R D Continue Protonix 40 mg twice a day.  This is a contributor to her chronic cough  ALLERGIC RHINITIS Continue loratadine, Singulair, this is a contributor to her chronic cough  Baltazar Apo, MD, PhD 01/09/2020, 3:34  PM [facility] [phone]  or if no answer [phone]

## 2020-01-13 IMAGING — MR MR LUMBAR SPINE W/O CM
5 series · 44 of 48 positions shown · non-contrast
Comparison: Lumbar spine radiographs 12/22/2017.

CLINICAL DATA: Chronic low back pain worsening over the last year.
Pain extends into the left leg. No recent injury or prior relevant
surgery. No history of malignancy.

EXAM:
MRI LUMBAR SPINE WITHOUT CONTRAST
TECHNIQUE: Multiplanar, multisequence MR imaging of the lumbar spine was
performed. No intravenous contrast was administered.

[Series 3: T2 post-contrast · sagittal · 4.0mm · 0.88mm/px · 5 of 13 slices shown]
[im 1/13]
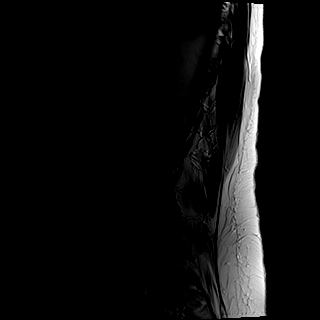
[im 4/13]
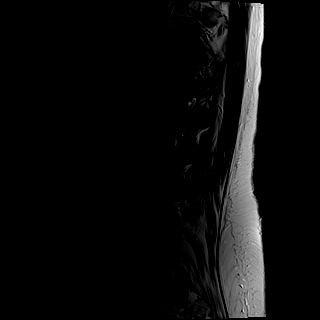
[im 7/13]
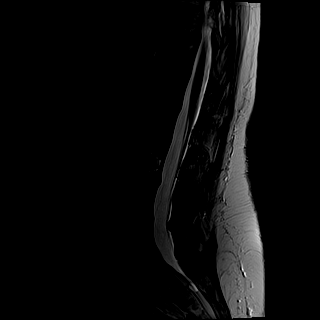
[im 10/13]
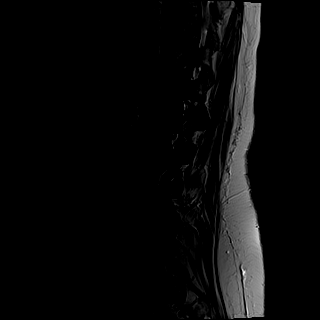
[im 13/13]
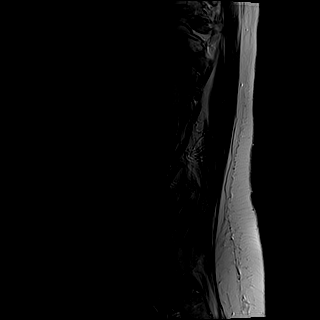

[Series 4: T1 · sagittal · 4.0mm · 0.88mm/px · 5 of 13 slices shown (1 of 2)]
[im 1/13]
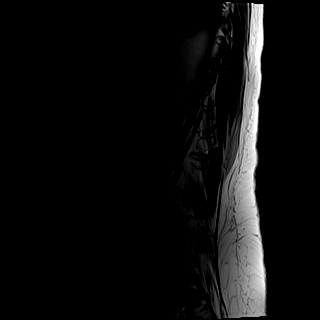
[im 4/13]
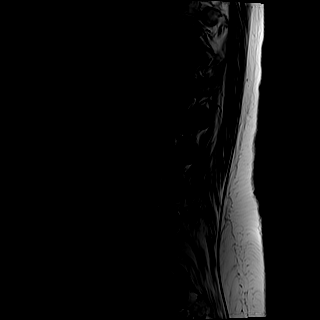
[im 7/13]
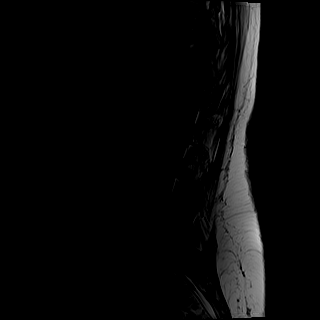
[im 10/13]
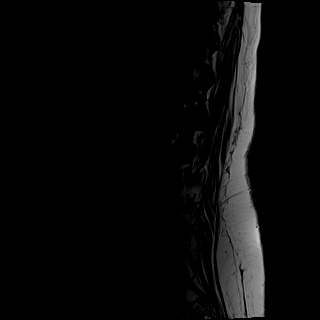
[im 13/13]
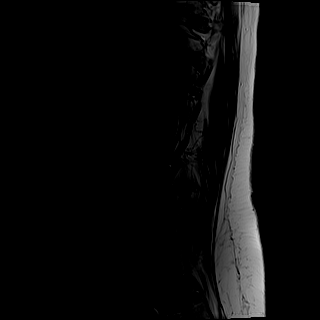

[Series 5: tirm sag · sagittal · 4.0mm · 0.55mm/px · 6 of 13 slices shown]
[im 1/13]
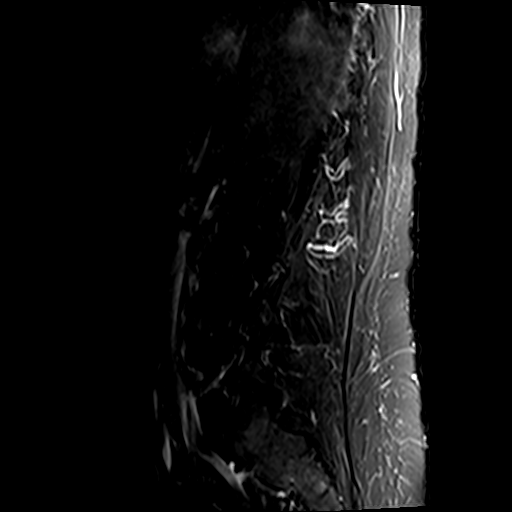
[im 3/13]
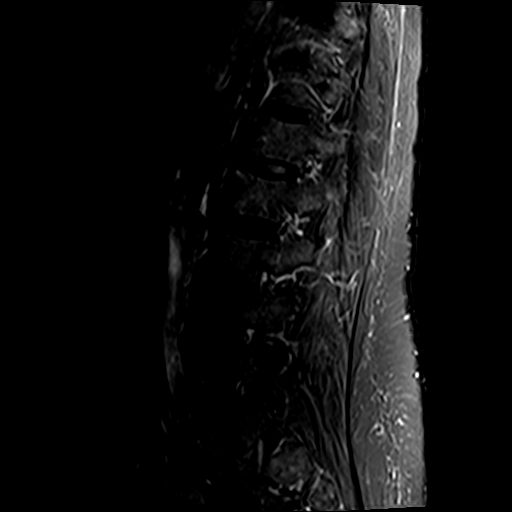
[im 5/13]
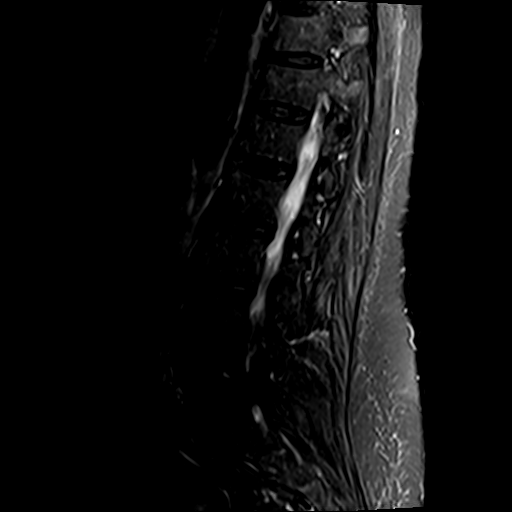
[im 8/13]
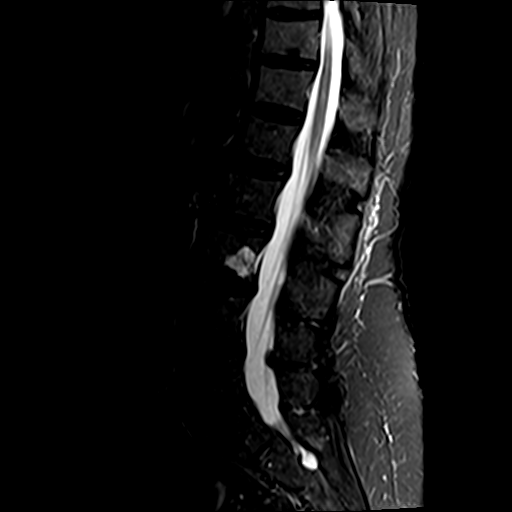
[im 10/13]
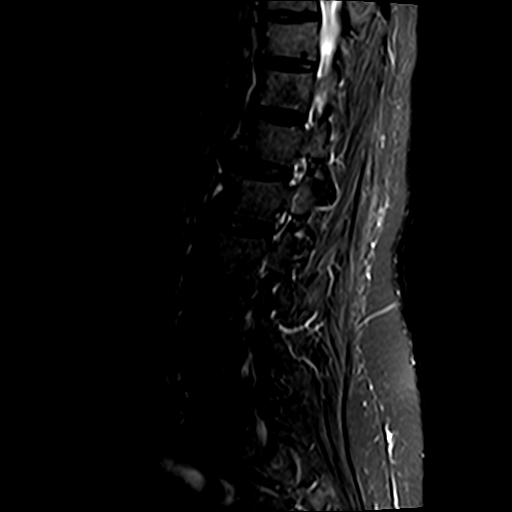
[im 13/13]
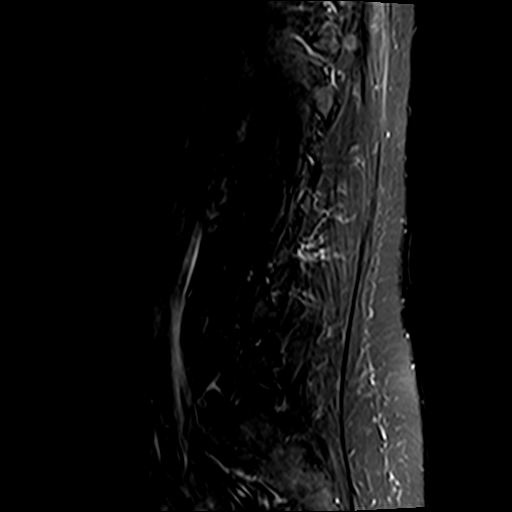

[Series 6: T1 · axial · 4.0mm · 0.78mm/px · z∈[-74,+119]mm · 12 of 37 slices shown (2 of 2)]
[im 1/37]
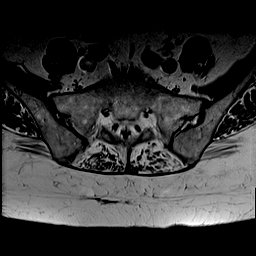
[im 3/37]
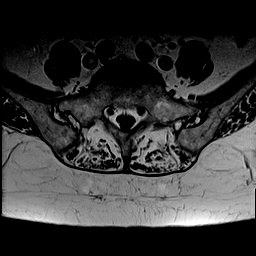
[im 5/37]
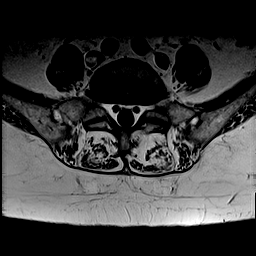
[im 8/37]
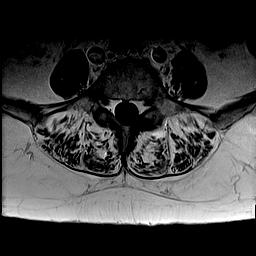
[im 10/37]
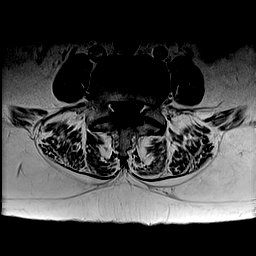
[im 13/37]
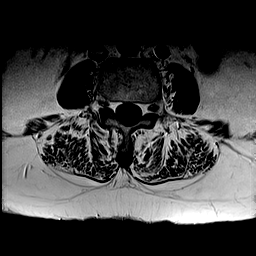
[im 17/37]
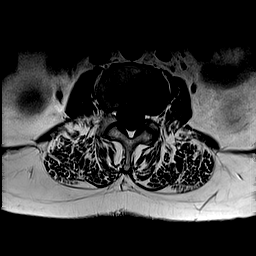
[im 20/37]
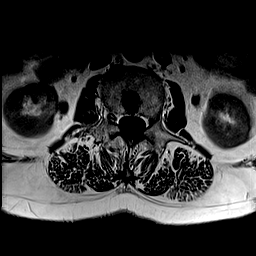
[im 22/37]
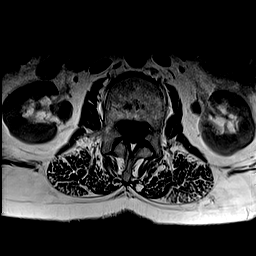
[im 27/37]
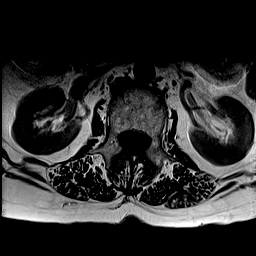
[im 32/37]
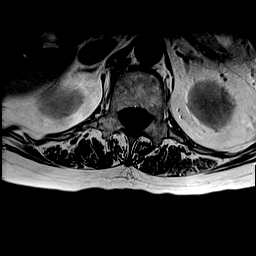
[im 37/37]
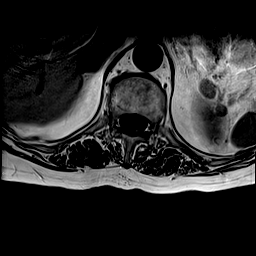

[Series 7: T2 · axial · 4.0mm · 0.78mm/px · z∈[-74,+119]mm · 16 of 37 slices shown]
[im 1/37]
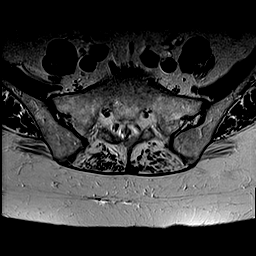
[im 3/37]
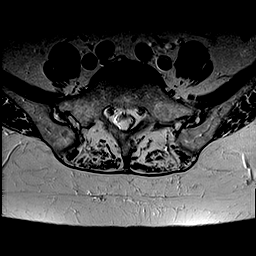
[im 5/37]
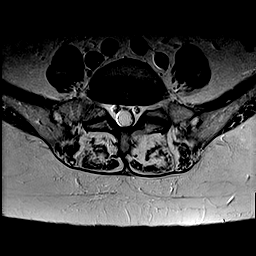
[im 8/37]
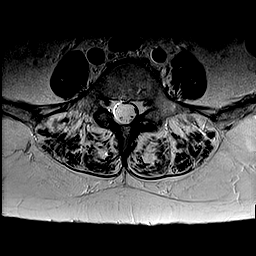
[im 10/37]
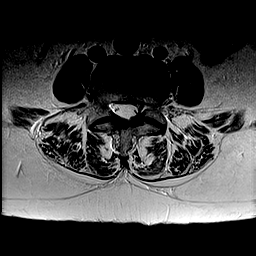
[im 13/37]
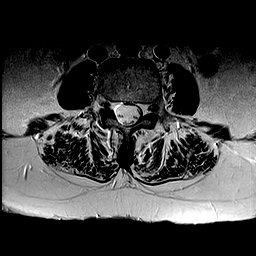
[im 15/37]
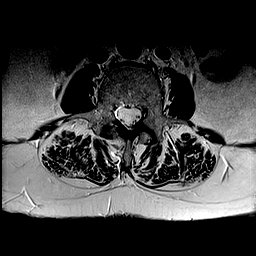
[im 17/37]
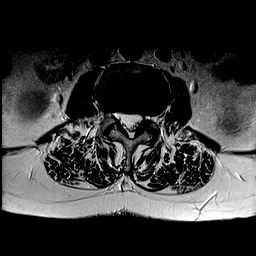
[im 20/37]
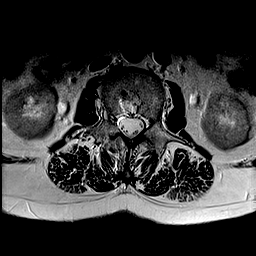
[im 22/37]
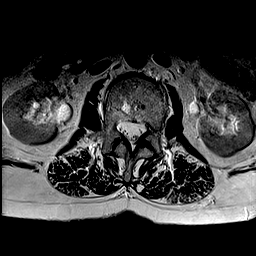
[im 25/37]
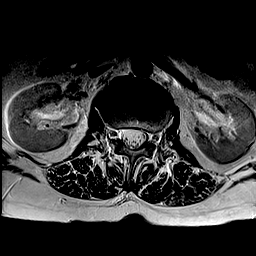
[im 27/37]
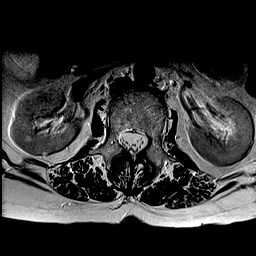
[im 29/37]
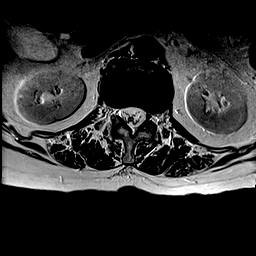
[im 32/37]
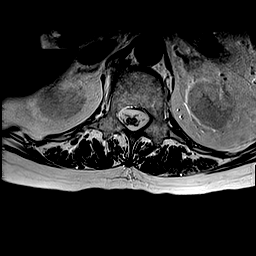
[im 34/37]
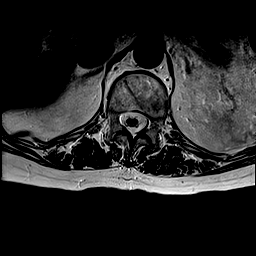
[im 37/37]
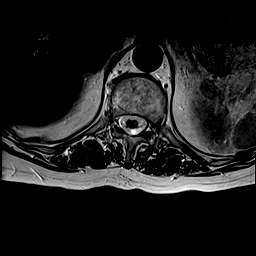

[44 of 48 positions shown; findings below may reference images not displayed]

FINDINGS: Segmentation: Conventional anatomy assumed, with the last open disc
space designated L5-S1.This corresponds with previous radiographs.

Alignment:  Minimal convex left scoliosis.

Vertebrae: There is a lobulated 19 mm lesion posteriorly in the L3
vertebral body, demonstrate heterogeneous T2 hyperintensity and low
T1 signal. No other marrow lesions are identified. The visualized
sacroiliac joints appear unremarkable.

Conus medullaris: Extends to the upper L2 level and appears normal.

Paraspinal and other soft tissues: No significant paraspinal
findings. Duplication of the IVC noted incidentally.

Disc levels:

L1-2: Mild disc bulging with anterior osteophyte formation. No
spinal stenosis or nerve root encroachment.

L2-3: Mild disc bulging with anterior osteophyte formation. No
spinal stenosis or nerve root encroachment.

L3-4: Mild disc bulging, facet and ligamentous hypertrophy. No
spinal stenosis or nerve root encroachment.

L4-5: Mild disc bulging, facet and ligamentous hypertrophy. There is
mild foraminal narrowing bilaterally, but no nerve root
encroachment.

L5-S1: Minimal disc bulging and facet hypertrophy. No spinal
stenosis or nerve root encroachment.
IMPRESSION: 1. Mild multilevel spondylosis for age. No significant spinal
stenosis or nerve root encroachment.
2. Indeterminate lesion posteriorly in the L3 vertebral body,
favored to be benign, although incompletely characterized by this
noncontrast study. MR follow-up without and with contrast in 6
months suggested.

## 2020-01-23 ENCOUNTER — Telehealth: Payer: Self-pay | Admitting: Orthopaedic Surgery

## 2020-01-23 NOTE — Telephone Encounter (Signed)
Have you seen any records/notes come over the fax on her?

## 2020-01-23 NOTE — Telephone Encounter (Signed)
Pt called wanting to make sure DR.Whitfield received all her records from [place]; pt would like a CB with a verification  [phone]

## 2020-01-23 NOTE — Telephone Encounter (Signed)
Nevermind, I found some.  Thanks!

## 2020-01-23 NOTE — Telephone Encounter (Signed)
Spoke with patient and let her know that we have her operative report from Dr.Rogers.

## 2020-01-23 NOTE — Telephone Encounter (Signed)
Records are here

## 2020-01-26 ENCOUNTER — Encounter: Payer: Self-pay | Admitting: Orthopaedic Surgery

## 2020-01-26 ENCOUNTER — Other Ambulatory Visit: Payer: Self-pay

## 2020-01-26 ENCOUNTER — Ambulatory Visit: Payer: Self-pay

## 2020-01-26 ENCOUNTER — Ambulatory Visit: Payer: Medicare Other | Admitting: Orthopaedic Surgery

## 2020-01-26 VITALS — Ht 62.0 in | Wt 145.0 lb

## 2020-01-26 DIAGNOSIS — M79661 Pain in right lower leg: Secondary | ICD-10-CM | POA: Diagnosis not present

## 2020-01-26 DIAGNOSIS — M1711 Unilateral primary osteoarthritis, right knee: Secondary | ICD-10-CM

## 2020-01-26 DIAGNOSIS — S82101S Unspecified fracture of upper end of right tibia, sequela: Secondary | ICD-10-CM
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
## 2020-03-05 DIAGNOSIS — Z7901 Long term (current) use of anticoagulants: Secondary | ICD-10-CM | POA: Insufficient documentation

## 2020-04-03 ENCOUNTER — Encounter: Payer: Self-pay | Admitting: Podiatry

## 2020-04-03 ENCOUNTER — Other Ambulatory Visit: Payer: Self-pay

## 2020-04-03 ENCOUNTER — Ambulatory Visit: Payer: Medicare Other | Admitting: Podiatry

## 2020-04-03 DIAGNOSIS — M79676 Pain in unspecified toe(s): Secondary | ICD-10-CM | POA: Diagnosis not present

## 2020-04-03 DIAGNOSIS — B351 Tinea unguium: Secondary | ICD-10-CM

## 2020-04-03 DIAGNOSIS — L84 Corns and callosities: Secondary | ICD-10-CM

## 2020-04-03 DIAGNOSIS — M21621 Bunionette of right foot: Secondary | ICD-10-CM

## 2020-04-03 DIAGNOSIS — I739 Peripheral vascular disease, unspecified: Secondary | ICD-10-CM

## 2020-04-03 DIAGNOSIS — M21622 Bunionette of left foot: Secondary | ICD-10-CM

## 2020-04-03 DIAGNOSIS — M542 Cervicalgia: Secondary | ICD-10-CM | POA: Insufficient documentation

## 2020-04-07 NOTE — Progress Notes (Signed)
Subjective: Samantha [name] presents today for follow up of callus(es) right hallux and painful mycotic toenails b/l that are difficult to trim. Pain interferes with ambulation. Aggravating factors include wearing enclosed shoe gear. Pain is relieved with periodic professional debridement.   She voices no new pedal problems on today's visit.   PCP is Dr. Lajean [name].  Last visit 01/03/2020.  Allergies  Allergen Reactions  . Erythromycin Diarrhea  . Benzalkonium Chloride Rash  . Erythromycin Rash  . Neosporin Plus Max St Rash  . Neosporin [Neomycin-Bacitracin Zn-Polymyx] Rash  . Penicillins Rash    DID THE REACTION INVOLVE: Swelling of the face/tongue/throat, SOB, or low BP? N Sudden or severe rash/hives, skin peeling, or the inside of the mouth or nose? Y Did it require medical treatment? N When did it last happen?Childhood If all above answers are "NO", may proceed with cephalosporin use.      Objective: There were no vitals filed for this visit.  Pt is a pleasant 86 y.o. year old Caucasian female WD, WN in NAD. AAO x 3.   Vascular Examination:  Capillary refill time to digits immediate b/l. Palpable DP pulses b/l. Nonpalpable PT pulses b/l. Pedal hair present b/l. Skin temperature gradient within normal limits b/l. No edema noted b/l.  Dermatological Examination: Pedal skin is thin shiny, atrophic b/l lower extremities. No open wounds bilaterally. No interdigital macerations bilaterally. Toenails 1-5 b/l elongated, discolored, dystrophic, thickened, crumbly with subungual debris and tenderness to dorsal palpation. Hyperkeratotic lesion(s) submet head 1 right foot.  No erythema, no edema, no drainage, no flocculence.  Musculoskeletal: Normal muscle strength 5/5 to all lower extremity muscle groups bilaterally. No pain crepitus or joint limitation noted with ROM b/l. Tailor's bunion deformity noted b/l lower extremities with tenderness to  palpation.  Neurological: Protective sensation intact 5/5 intact bilaterally with 10g monofilament b/l. Vibratory sensation intact b/l.  Assessment: 1. Pain due to onychomycosis of toenail   2. Callus   3. Tailor's bunion of both feet   4. PAD (peripheral artery disease) (HCC)     Plan: -Toenails 1-5 b/l were debrided in length and girth with sterile nail nippers and dremel without iatrogenic bleeding.  -Callus(es) submet head 1 right foot pared utilizing sterile scalpel blade without complication or incident. Total number debrided =1.  Applied felt aperture pad submet head 1 right foot.  -Patient to report any pedal injuries to medical professional immediately. -Continue Tailor's bunion shields bilaterally. Apply every morning. Remove every evening. -Patient to continue soft, supportive shoe gear daily. -Patient/POA to call should there be question/concern in the interim.  Return in about 3 months (around 07/04/2020) for toenail debridement w/corn(s)/callus(es).  Marzetta Board, DPM

## 2020-06-22 DIAGNOSIS — J4530 Mild persistent asthma, uncomplicated: Secondary | ICD-10-CM | POA: Diagnosis not present

## 2020-06-22 DIAGNOSIS — M810 Age-related osteoporosis without current pathological fracture: Secondary | ICD-10-CM | POA: Diagnosis not present

## 2020-06-22 DIAGNOSIS — E7800 Pure hypercholesterolemia, unspecified: Secondary | ICD-10-CM | POA: Diagnosis not present

## 2020-06-22 DIAGNOSIS — I480 Paroxysmal atrial fibrillation: Secondary | ICD-10-CM | POA: Diagnosis not present

## 2020-06-22 DIAGNOSIS — I509 Heart failure, unspecified: Secondary | ICD-10-CM | POA: Diagnosis not present

## 2020-06-22 DIAGNOSIS — E039 Hypothyroidism, unspecified: Secondary | ICD-10-CM | POA: Diagnosis not present

## 2020-06-22 DIAGNOSIS — K219 Gastro-esophageal reflux disease without esophagitis: Secondary | ICD-10-CM | POA: Diagnosis not present

## 2020-07-09 ENCOUNTER — Ambulatory Visit: Payer: Medicare Other | Admitting: Podiatry

## 2020-07-20 DIAGNOSIS — K219 Gastro-esophageal reflux disease without esophagitis: Secondary | ICD-10-CM | POA: Diagnosis not present

## 2020-07-20 DIAGNOSIS — I509 Heart failure, unspecified: Secondary | ICD-10-CM | POA: Diagnosis not present

## 2020-07-20 DIAGNOSIS — M810 Age-related osteoporosis without current pathological fracture: Secondary | ICD-10-CM | POA: Diagnosis not present

## 2020-07-20 DIAGNOSIS — I480 Paroxysmal atrial fibrillation: Secondary | ICD-10-CM | POA: Diagnosis not present

## 2020-07-20 DIAGNOSIS — E039 Hypothyroidism, unspecified: Secondary | ICD-10-CM | POA: Diagnosis not present

## 2020-07-20 DIAGNOSIS — J4530 Mild persistent asthma, uncomplicated: Secondary | ICD-10-CM | POA: Diagnosis not present

## 2020-07-20 DIAGNOSIS — E7800 Pure hypercholesterolemia, unspecified: Secondary | ICD-10-CM | POA: Diagnosis not present

## 2020-07-30 ENCOUNTER — Encounter: Payer: Self-pay | Admitting: Podiatry

## 2020-07-30 ENCOUNTER — Ambulatory Visit: Payer: Medicare Other | Admitting: Podiatry

## 2020-07-30 ENCOUNTER — Other Ambulatory Visit: Payer: Self-pay

## 2020-07-30 DIAGNOSIS — M2042 Other hammer toe(s) (acquired), left foot: Secondary | ICD-10-CM

## 2020-07-30 DIAGNOSIS — I509 Heart failure, unspecified: Secondary | ICD-10-CM | POA: Insufficient documentation

## 2020-07-30 DIAGNOSIS — L84 Corns and callosities: Secondary | ICD-10-CM | POA: Diagnosis not present

## 2020-07-30 DIAGNOSIS — B351 Tinea unguium: Secondary | ICD-10-CM | POA: Diagnosis not present

## 2020-07-30 DIAGNOSIS — J4530 Mild persistent asthma, uncomplicated: Secondary | ICD-10-CM | POA: Insufficient documentation

## 2020-07-30 DIAGNOSIS — M79676 Pain in unspecified toe(s): Secondary | ICD-10-CM | POA: Diagnosis not present

## 2020-07-30 DIAGNOSIS — I700 Atherosclerosis of aorta: Secondary | ICD-10-CM | POA: Insufficient documentation

## 2020-07-30 DIAGNOSIS — E7800 Pure hypercholesterolemia, unspecified: Secondary | ICD-10-CM | POA: Insufficient documentation

## 2020-07-30 DIAGNOSIS — I739 Peripheral vascular disease, unspecified: Secondary | ICD-10-CM | POA: Diagnosis not present

## 2020-07-30 DIAGNOSIS — M21621 Bunionette of right foot: Secondary | ICD-10-CM

## 2020-07-30 DIAGNOSIS — M2041 Other hammer toe(s) (acquired), right foot: Secondary | ICD-10-CM

## 2020-07-30 DIAGNOSIS — M21622 Bunionette of left foot: Secondary | ICD-10-CM

## 2020-07-30 DIAGNOSIS — J4520 Mild intermittent asthma, uncomplicated: Secondary | ICD-10-CM | POA: Insufficient documentation

## 2020-07-30 DIAGNOSIS — D6859 Other primary thrombophilia: Secondary | ICD-10-CM | POA: Insufficient documentation

## 2020-07-30 DIAGNOSIS — M810 Age-related osteoporosis without current pathological fracture: Secondary | ICD-10-CM | POA: Insufficient documentation

## 2020-07-30 DIAGNOSIS — K909 Intestinal malabsorption, unspecified: Secondary | ICD-10-CM | POA: Insufficient documentation

## 2020-07-30 NOTE — Progress Notes (Signed)
Subjective: Samantha [name] presents today for follow up of callus(es) right hallux and painful mycotic toenails b/l that are difficult to trim. Pain interferes with ambulation. Aggravating factors include wearing enclosed shoe gear. Pain is relieved with periodic professional debridement.   She voices no new pedal problems on today's visit.   PCP is Dr. Lajean Manes.  Last visit 07/20/2020.  Allergies  Allergen Reactions  . Erythromycin Diarrhea    Other reaction(s): rash  . Azithromycin     Other reaction(s): diarrhea  . Other     Other reaction(s): nausea Other reaction(s): rash Other reaction(s): rash  . Benzalkonium Chloride Rash  . Erythromycin Rash  . Neosporin Plus Max St Rash  . Neosporin [Neomycin-Bacitracin Zn-Polymyx] Rash  . Penicillins Rash    DID THE REACTION INVOLVE: Swelling of the face/tongue/throat, SOB, or low BP? N Sudden or severe rash/hives, skin peeling, or the inside of the mouth or nose? Y Did it require medical treatment? N When did it last happen?Childhood If all above answers are "NO", may proceed with cephalosporin use.      Objective: There were no vitals filed for this visit.  Pt is a pleasant 87 y.o. year old Caucasian female WD, WN in NAD. AAO x 3.   Vascular Examination:  Capillary refill time to digits immediate b/l. Palpable pedal pulses b/l LE. Nonpalpable PT pulse(s) b/l lower extremities. Pedal hair present. Lower extremity skin temperature gradient within normal limits. No edema noted b/l lower extremities. Varicosities present b/l.  Dermatological Examination: Pedal skin is thin shiny, atrophic b/l lower extremities. No open wounds bilaterally. No interdigital macerations bilaterally. Toenails 1-5 b/l elongated, discolored, dystrophic, thickened, crumbly with subungual debris and tenderness to dorsal palpation. Hyperkeratotic lesion(s) R hallux and submet head 1 right foot.  No erythema, no edema, no drainage, no  fluctuance.  Musculoskeletal: Normal muscle strength 5/5 to all lower extremity muscle groups bilaterally. No pain crepitus or joint limitation noted with ROM b/l. Hammertoes 2-5 b/l. Tailor's bunion deformity noted b/l lower extremities with tenderness to palpation.  Neurological: Protective sensation intact 5/5 intact bilaterally with 10g monofilament b/l. Vibratory sensation intact b/l.  Assessment: 1. Pain due to onychomycosis of toenail   2. Callus   3. Tailor's bunion of both feet   4. Acquired hammertoes of both feet   5. PAD (peripheral artery disease) (HCC)     Plan: -Toenails 1-5 b/l were debrided in length and girth with sterile nail nippers and dremel without iatrogenic bleeding.  -Callus(es) submet head 1 right foot and medial hallux IPJ pared utilizing sterile scalpel blade without complication or incident. Total number debrided =2.   -Patient to report any pedal injuries to medical professional immediately. -Continue Tailor's bunion shields bilaterally. Apply every morning. Remove every evening. -Patient to continue soft, supportive shoe gear daily. -Patient/POA to call should there be question/concern in the interim.  Return in about 3 months (around 10/27/2020).  Marzetta Board, DPM

## 2020-08-01 ENCOUNTER — Other Ambulatory Visit: Payer: Self-pay

## 2020-08-01 ENCOUNTER — Encounter: Payer: Self-pay | Admitting: Emergency Medicine

## 2020-08-01 ENCOUNTER — Ambulatory Visit: Payer: Medicare Other | Admitting: Emergency Medicine

## 2020-08-01 DIAGNOSIS — K219 Gastro-esophageal reflux disease without esophagitis: Secondary | ICD-10-CM | POA: Diagnosis not present

## 2020-08-01 DIAGNOSIS — J4520 Mild intermittent asthma, uncomplicated: Secondary | ICD-10-CM | POA: Diagnosis not present

## 2020-08-01 DIAGNOSIS — J301 Allergic rhinitis due to pollen: Secondary | ICD-10-CM

## 2020-08-01 MED ORDER — ALBUTEROL SULFATE HFA 108 (90 BASE) MCG/ACT IN AERS: 1.0000 | INHALATION_SPRAY | Freq: Four times a day (QID) | RESPIRATORY_TRACT | 0 refills | Status: DC | PRN

## 2020-08-01 NOTE — Assessment & Plan Note (Signed)
Very well compensated.  GERD and allergic rhinitis are well controlled which is probably why she is doing so well.  She rarely uses albuterol.  She tolerated coming off Dulera.  I do not think we need to restart an ICS/LABA.  We will refill her albuterol and she will keep it available.

## 2020-08-01 NOTE — Addendum Note (Signed)
Addended by: Gavin Potters R on: 08/01/2020 11:56 AM   Modules accepted: Orders

## 2020-08-01 NOTE — Progress Notes (Signed)
Subjective:    Patient ID: Samantha [name], female    DOB: 1930/04/02, 87 y.o.   MRN: [id number]  HPI  ROV 08/01/20 --follow-up visit for pleasant 87 year old woman with A. fib, allergic rhinitis, GERD and mild intermittent asthma.  She also has allergic rhinitis that is a contributor.  At her last visit we tried coming off of scheduled ICS/LABA see if she would tolerate.  She remained on Protonix twice daily, loratadine, Singulair.  Today she reports that she hasn't missed the [facility]. She has albuterol, but never needs it. She is active, does not have any dyspnea currently. She has more trouble during the Summer months. Cough is controlled. No wheeze.    Review of Systems  Constitutional: Negative for fever and unexpected weight change.  HENT: Negative for congestion, dental problem, ear pain, nosebleeds, postnasal drip, rhinorrhea, sinus pressure, sneezing, sore throat and trouble swallowing.   Eyes: Negative for redness and itching.  Respiratory: Positive for shortness of breath. Negative for cough, chest tightness and wheezing.   Cardiovascular: Negative for palpitations and leg swelling.  Gastrointestinal: Negative for nausea and vomiting.  Genitourinary: Negative for dysuria.  Musculoskeletal: Negative for joint swelling.  Skin: Negative for rash.  Neurological: Negative for headaches.  Hematological: Bruises/bleeds easily.  Psychiatric/Behavioral: Negative for dysphoric mood. The patient is not nervous/anxious.     Past Medical History:  Diagnosis Date  . Arthritis   . Asthma   . GERD (gastroesophageal reflux disease)   . High cholesterol   . Hypothyroidism   . LBBB (left bundle branch block)   . Ventricular ectopy      Family History  Problem Relation Age of Onset  . Heart attack Mother   . Throat cancer Father   . Congestive Heart Failure Sister   . Congestive Heart Failure Brother      Social History   Socioeconomic History  . Marital status: Widowed    Spouse  name: Not on file  . Number of children: Not on file  . Years of education: Not on file  . Highest education level: Not on file  Occupational History  . Not on file  Tobacco Use  . Smoking status: Passive Smoke Exposure - Never Smoker  . Smokeless tobacco: Never Used  Vaping Use  . Vaping Use: Never used  Substance and Sexual Activity  . Alcohol use: No    Alcohol/week: 0.0 standard drinks  . Drug use: No  . Sexual activity: Not on file  Other Topics Concern  . Not on file  Social History Narrative   ** Merged History Encounter **       Social Determinants of Health   Financial Resource Strain: Not on file  Food Insecurity: Not on file  Transportation Needs: Not on file  Physical Activity: Not on file  Stress: Not on file  Social Connections: Not on file  Intimate Partner Violence: Not on file     Allergies  Allergen Reactions  . Erythromycin Diarrhea    Other reaction(s): rash  . Azithromycin     Other reaction(s): diarrhea  . Other     Other reaction(s): nausea Other reaction(s): rash Other reaction(s): rash  . Benzalkonium Chloride Rash  . Erythromycin Rash  . Neosporin Plus Max St Rash  . Neosporin [Neomycin-Bacitracin Zn-Polymyx] Rash  . Penicillins Rash    DID THE REACTION INVOLVE: Swelling of the face/tongue/throat, SOB, or low BP? N Sudden or severe rash/hives, skin peeling, or the inside of the mouth or nose?  Y Did it require medical treatment? N When did it last happen?Childhood If all above answers are "NO", may proceed with cephalosporin use.      Outpatient Medications Prior to Visit  Medication Sig Dispense Refill  . albuterol (PROVENTIL HFA;VENTOLIN HFA) 108 (90 Base) MCG/ACT inhaler Inhale 1-2 puffs into the lungs every 6 (six) hours as needed for wheezing or shortness of breath.     Marland Kitchen apixaban (ELIQUIS) 5 MG TABS tablet Take 5 mg by mouth 2 (two) times daily.    . Calcium-Magnesium-Zinc (CALCIUM-MAGNESUIUM-ZINC PO) Take 1 tablet by  mouth as directed. (Monday, Wednesday, Friday)    . Cholecalciferol (VITAMIN D) 2000 units tablet Take 2,000 Units by mouth daily.    Marland Kitchen conjugated estrogens (PREMARIN) vaginal cream 1 gram    . denosumab (PROLIA) 60 MG/ML SOSY injection Inject into the skin.    . Ferrous Sulfate (FER-IRON PO) Take 1 tablet by mouth 3 (three) times a week.    . furosemide (LASIX) 40 MG tablet Take 1 tablet (40 mg total) by mouth daily. (Patient taking differently: Take 40 mg by mouth daily. Pt takes in the afternoon with her potassium tablet) 90 tablet 3  . hydrocortisone cream 1 % 1 application    . Influenza vac split quadrivalent PF (FLUZONE HIGH-DOSE) 0.5 ML injection Fluzone High-Dose 2019-20 (PF) 180 mcg/0.5 mL intramuscular syringe  ADM 0.5ML IM UTD    . levothyroxine (SYNTHROID, LEVOTHROID) 112 MCG tablet Take 112 mcg by mouth daily.    Marland Kitchen loratadine (CLARITIN) 10 MG tablet Take 10 mg by mouth daily.    Marland Kitchen lovastatin (MEVACOR) 20 MG tablet Take 20 mg by mouth at bedtime.    . metoprolol succinate (TOPROL-XL) 50 MG 24 hr tablet Take 50 mg by mouth at bedtime. Take with or immediately following a meal.    . montelukast (SINGULAIR) 10 MG tablet Take 10 mg by mouth at bedtime.    . Multiple Vitamins-Minerals (PRESERVISION AREDS PO) Take 1 tablet by mouth 2 (two) times daily.    . mupirocin ointment (BACTROBAN) 2 % 3 (three) times daily.    Marland Kitchen ofloxacin (FLOXIN) 0.3 % OTIC solution SMARTSIG:4 Drop(s) Left Ear Twice Daily    . Omega-3 Fatty Acids (FISH OIL) 1000 MG CAPS Take 1,000 mg by mouth 3 (three) times daily.    Marland Kitchen omeprazole (PRILOSEC) 40 MG capsule Take 40 mg by mouth 2 (two) times daily.     . polyethylene glycol (MIRALAX / GLYCOLAX) packet Take 8.5 g by mouth every other day.    . potassium chloride (K-DUR) 10 MEQ tablet Take 1 tablet (10 mEq total) by mouth daily. (Patient taking differently: Take 10 mEq by mouth daily. Pt takes in the afternoon with her lasix) 90 tablet 3  . sodium chloride (OCEAN) 0.65  % SOLN nasal spray Place 1 spray into both nostrils 2 (two) times daily.    Marland Kitchen triamcinolone (KENALOG) 0.025 % ointment Apply 1 application topically 2 (two) times daily.    . vitamin B-12 (CYANOCOBALAMIN) 100 MCG tablet Take 100 mcg by mouth as directed. Mon, wed, fri    . mometasone-formoterol (DULERA) 100-5 MCG/ACT AERO Inhale 1 puff into the lungs 2 (two) times daily.  (Patient not taking: Reported on 08/01/2020)     No facility-administered medications prior to visit.        Objective:   Physical Exam Vitals:   08/01/20 1136  BP: 108/70  Pulse: (!) 101  Temp: 98 F (36.7 C)  TempSrc: Temporal  SpO2: 95%  Weight: 152 lb 3.2 oz (69 kg)  Height: 5\' 2"  (1.575 m)   Gen: Pleasant, elderly overwt, in no distress,  normal affect  ENT: No lesions,  mouth clear,  oropharynx clear, no postnasal drip  Neck: No JVD, no stridor  Lungs: No use of accessory muscles, clear without rales or rhonchi, no crackles  Cardiovascular: irregular,  no peripheral edema  Musculoskeletal: No deformities, no cyanosis or clubbing  Neuro: alert, non focal  Skin: Warm, no lesions or rashes       Assessment & Plan:  Mild intermittent asthma Very well compensated.  GERD and allergic rhinitis are well controlled which is probably why she is doing so well.  She rarely uses albuterol.  She tolerated coming off Dulera.  I do not think we need to restart an ICS/LABA.  We will refill her albuterol and she will keep it available.  Allergic rhinitis Loratadine, Singulair as ordered  G E R D Omeprazole twice daily  Baltazar Apo, MD, PhD 08/01/2020, 11:53 AM [facility] [phone] or if no answer [phone]

## 2020-08-01 NOTE — Patient Instructions (Addendum)
We can hold off on restarting a scheduled inhaler medication at this time since you are doing well. Keep your albuterol available to use 2 puffs if needed for shortness of breath, chest tightness, wheezing. Continue loratadine and Singulair as you have been taking them. Continue omeprazole as you have been taking it. Follow with Dr. Lamonte Sakai in 12 months or sooner if you have any problems.

## 2020-08-01 NOTE — Assessment & Plan Note (Signed)
Omeprazole twice daily 

## 2020-08-01 NOTE — Assessment & Plan Note (Signed)
Loratadine, Singulair as ordered

## 2020-08-14 DIAGNOSIS — I509 Heart failure, unspecified: Secondary | ICD-10-CM | POA: Diagnosis not present

## 2020-08-14 DIAGNOSIS — Z79899 Other long term (current) drug therapy: Secondary | ICD-10-CM | POA: Diagnosis not present

## 2020-08-14 DIAGNOSIS — D6869 Other thrombophilia: Secondary | ICD-10-CM | POA: Diagnosis not present

## 2020-08-14 DIAGNOSIS — I480 Paroxysmal atrial fibrillation: Secondary | ICD-10-CM | POA: Diagnosis not present

## 2020-08-14 DIAGNOSIS — L989 Disorder of the skin and subcutaneous tissue, unspecified: Secondary | ICD-10-CM | POA: Diagnosis not present

## 2020-08-14 DIAGNOSIS — R202 Paresthesia of skin: Secondary | ICD-10-CM | POA: Diagnosis not present

## 2020-08-15 DIAGNOSIS — I509 Heart failure, unspecified: Secondary | ICD-10-CM | POA: Diagnosis not present

## 2020-08-15 DIAGNOSIS — I480 Paroxysmal atrial fibrillation: Secondary | ICD-10-CM | POA: Diagnosis not present

## 2020-08-15 DIAGNOSIS — M810 Age-related osteoporosis without current pathological fracture: Secondary | ICD-10-CM | POA: Diagnosis not present

## 2020-08-15 DIAGNOSIS — K219 Gastro-esophageal reflux disease without esophagitis: Secondary | ICD-10-CM | POA: Diagnosis not present

## 2020-08-15 DIAGNOSIS — E039 Hypothyroidism, unspecified: Secondary | ICD-10-CM | POA: Diagnosis not present

## 2020-08-15 DIAGNOSIS — J4530 Mild persistent asthma, uncomplicated: Secondary | ICD-10-CM | POA: Diagnosis not present

## 2020-08-15 DIAGNOSIS — E7800 Pure hypercholesterolemia, unspecified: Secondary | ICD-10-CM | POA: Diagnosis not present

## 2020-08-16 DIAGNOSIS — Z974 Presence of external hearing-aid: Secondary | ICD-10-CM | POA: Diagnosis not present

## 2020-08-16 DIAGNOSIS — H7292 Unspecified perforation of tympanic membrane, left ear: Secondary | ICD-10-CM | POA: Diagnosis not present

## 2020-08-16 DIAGNOSIS — H6123 Impacted cerumen, bilateral: Secondary | ICD-10-CM | POA: Diagnosis not present

## 2020-08-16 DIAGNOSIS — H61893 Other specified disorders of external ear, bilateral: Secondary | ICD-10-CM | POA: Diagnosis not present

## 2020-08-22 ENCOUNTER — Ambulatory Visit: Payer: Medicare Other | Admitting: Podiatry

## 2020-08-22 DIAGNOSIS — L821 Other seborrheic keratosis: Secondary | ICD-10-CM | POA: Diagnosis not present

## 2020-08-22 DIAGNOSIS — L820 Inflamed seborrheic keratosis: Secondary | ICD-10-CM | POA: Diagnosis not present

## 2020-08-29 ENCOUNTER — Ambulatory Visit: Payer: Medicare Other | Admitting: Orthopaedic Surgery

## 2020-08-29 ENCOUNTER — Other Ambulatory Visit: Payer: Self-pay

## 2020-08-29 ENCOUNTER — Encounter: Payer: Self-pay | Admitting: Orthopaedic Surgery

## 2020-08-29 VITALS — Ht 62.0 in | Wt 152.0 lb

## 2020-08-29 DIAGNOSIS — G5601 Carpal tunnel syndrome, right upper limb: Secondary | ICD-10-CM

## 2020-08-29 NOTE — Progress Notes (Signed)
Office Visit Note   Patient: Samantha [name]           Date of Birth: 1930-01-19           MRN: [id number] Visit Date: 08/29/2020              Requested by: Lajean Manes, MD [address] [place],  [place] [address] PCP: Lajean Manes, MD   Assessment & Plan: Visit Diagnoses:  1. Carpal tunnel syndrome, right upper limb     Plan: Recurrent symptoms of right carpal tunnel syndrome with numbness and tingling into the radial 3 digits particularly when she drives her car even at night.  She had EMGs and nerve conduction studies performed about a year and a half ago demonstrating mild to moderate right median nerve entrapment at the wrist consistent with carpal tunnel.  She is now wearing a splint at night but I think she will wear it as much as she can during the day as well.  Return in 2 weeks.  Consider carpal canal injection if no improvement.  I think she also has some arthritis in her hand she has some stiffness in the morning but it improves fairly quickly and today on exam had full range of motion of her digits without any obvious joint changes  Follow-Up Instructions: Return in about 2 weeks (around 09/12/2020).   Orders:  No orders of the defined types were placed in this encounter.  No orders of the defined types were placed in this encounter.     Procedures: No procedures performed   Clinical Data: No additional findings.   Subjective: Chief Complaint  Patient presents with  . Right Hand - Pain  Patient presents today for right hand and arm pain. She said that it started about two months ago. No known injury. She has numbness and tingling in her right fingers. She has pain that travels up her arm. The symptoms seems to occur more at night and wake her from sleep. She cannot make a fist. She states that she gets better throughout the day. She was diagnosed with carpal tunnel on the right side a couple years ago. She initially thought it was just her carpal  tunnel flaring up, so she started to wear her velcro brace. The brace has not seemed to help. She is right hand dominant. She does not take anything for pain.   HPI  Review of Systems   Objective: Vital Signs: Ht 5\' 2"  (1.575 m)   Wt 152 lb (68.9 kg)   BMI 27.80 kg/m   Physical Exam Constitutional:      Appearance: She is well-developed.  Eyes:     Pupils: Pupils are equal, round, and reactive to light.  Pulmonary:     Effort: Pulmonary effort is normal.  Skin:    General: Skin is warm and dry.  Neurological:     Mental Status: She is alert and oriented to person, place, and time.  Psychiatric:        Behavior: Behavior normal.     Ortho Exam right hand with minimally positive Phalen's and minimally positive Tinel's across the median nerve compared to the left.  Good opposition of thumb the little finger with good strength.  She has a little funny feeling at the tips of her radial 3 digits but otherwise good feeling.  Good capillary refill.  Able to make a full fist although she notes in the morning she is a little bit stiff Specialty  Comments:  No specialty comments available.  Imaging: No results found.   PMFS History: Patient Active Problem List   Diagnosis Date Noted  . Age-related osteoporosis without current pathological fracture 07/30/2020  . Hardening of the aorta (main artery of the heart) ([place]) 07/30/2020  . Heart failure ([place]) 07/30/2020  . Intestinal malabsorption 07/30/2020  . Mild intermittent asthma 07/30/2020  . Pure hypercholesterolemia 07/30/2020  . Thrombophilia ([place]) 07/30/2020  . Neck pain 04/03/2020  . Long term (current) use of anticoagulants 03/05/2020  . Unilateral primary osteoarthritis, right knee 01/26/2020  . BPPV (benign paroxysmal positional vertigo), right 05/13/2019  . Anticoagulated 05/13/2019  . Epistaxis 05/13/2019  . Carpal tunnel syndrome, right upper limb 04/12/2019  . Trigger thumb, left thumb 02/10/2019  . Numbness and  tingling in right hand 02/10/2019  . Pain in left foot 02/10/2019  . Closed fracture of right proximal tibia 06/22/2018  . Paroxysmal atrial fibrillation ([place]) 05/29/2018  . Hypokalemia 05/29/2018  . Pain and swelling of right lower extremity 05/28/2018  . Closed fracture of shaft of tibia 04/29/2018  . Aftercare 04/23/2018  . SAH (subarachnoid hemorrhage) ([place]) 04/02/2018  . Chronic bilateral low back pain with bilateral sciatica 03/08/2018  . Abnormal CT scan, chest 08/08/2016  . Hearing loss 09/10/2015  . Impacted cerumen of right ear 09/10/2015  . Otorrhea of left ear 09/10/2015  . Tympanic membrane perforation, left 09/10/2015  . Near syncope 01/06/2015  . Left leg weakness 01/06/2015  . Hyperlipidemia   . Hypothyroidism   . Ventricular ectopy   . Chest pain 05/22/2014  . Left bundle branch block 05/22/2014  . PVC's (premature ventricular contractions) 05/22/2014  . HOARSENESS 02/14/2010  . CHEST PAIN, ATYPICAL 02/14/2010  . Allergic rhinitis 02/13/2010  . G E R D 02/13/2010   Past Medical History:  Diagnosis Date  . Arthritis   . Asthma   . GERD (gastroesophageal reflux disease)   . High cholesterol   . Hypothyroidism   . LBBB (left bundle branch block)   . Ventricular ectopy     Family History  Problem Relation Age of Onset  . Heart attack Mother   . Throat cancer Father   . Congestive Heart Failure Sister   . Congestive Heart Failure Brother     Past Surgical History:  Procedure Laterality Date  . CARDIOVERSION N/A 11/27/2015   Procedure: CARDIOVERSION;  Surgeon: Josue Hector, MD;  Location: [facility] ENDOSCOPY;  Service: Cardiovascular;  Laterality: N/A;  . KNEE ARTHROSCOPY    . TIBIA IM NAIL INSERTION Right 04/05/2018   Procedure: INTRAMEDULLARY (IM) NAIL TIBIAL;  Surgeon: Nicholes Stairs, MD;  Location: [facility];  Service: Orthopedics;  Laterality: Right;   Social History   Occupational History  . Not on file  Tobacco Use  . Smoking status: Passive  Smoke Exposure - Never Smoker  . Smokeless tobacco: Never Used  Vaping Use  . Vaping Use: Never used  Substance and Sexual Activity  . Alcohol use: No    Alcohol/week: 0.0 standard drinks  . Drug use: No  . Sexual activity: Not on file

## 2020-09-12 ENCOUNTER — Other Ambulatory Visit: Payer: Self-pay

## 2020-09-12 ENCOUNTER — Encounter: Payer: Self-pay | Admitting: Orthopaedic Surgery

## 2020-09-12 ENCOUNTER — Ambulatory Visit: Payer: Medicare Other | Admitting: Orthopaedic Surgery

## 2020-09-12 VITALS — Ht 62.0 in | Wt 152.0 lb

## 2020-09-12 DIAGNOSIS — G5601 Carpal tunnel syndrome, right upper limb: Secondary | ICD-10-CM

## 2020-09-12 NOTE — Progress Notes (Signed)
Office Visit Note   Patient: Samantha [name]           Date of Birth: [birth year]           MRN: [id number] Visit Date: 09/12/2020              Requested by: Samantha Manes, MD [address] [place],  [place] [address] PCP: Samantha Manes, MD   Assessment & Plan: Visit Diagnoses:  1. Carpal tunnel syndrome, right upper limb     Plan: Samantha [name] has right carpal tunnel syndrome.  She was last seen several weeks ago and was asked to wear a splint.  She notes that she is significantly better does not have nearly the problem she had before.  This is been an ongoing problem with prior EMGs and nerve conduction studies performed in 2020 consistent with mild to moderate median nerve entrapment at the wrist.  She presently is not having any significant numbness or tingling or problems sleeping.  I discussed different treatment options including cortisone injection to the carpal canal and surgery.  Because she is improving she wants to continue wearing the splint for "a while longer".  We will plan to see her back as needed.  She is also experiencing symptoms consistent with a right trigger thumb and right long finger triggering but "mild" no specific treatment other than Voltaren gel  Follow-Up Instructions: Return if symptoms worsen or fail to improve.   Orders:  No orders of the defined types were placed in this encounter.  No orders of the defined types were placed in this encounter.     Procedures: No procedures performed   Clinical Data: No additional findings.   Subjective: Chief Complaint  Patient presents with  . Right Hand - Follow-up    Carpal tunnel  Patient presents today for a two week follow up on her right hand. She is doing well while wearing the brace and it offers great relief. She wants to talk about a more permanent solution.  HPI  Review of Systems   Objective: Vital Signs: Ht 5\' 2"  (1.575 m)   Wt 152 lb (68.9 kg)   BMI 27.80 kg/m    Physical Exam Constitutional:      Appearance: She is well-developed.  Eyes:     Pupils: Pupils are equal, round, and reactive to light.  Pulmonary:     Effort: Pulmonary effort is normal.  Skin:    General: Skin is warm and dry.  Neurological:     Mental Status: She is alert and oriented to person, place, and time.  Psychiatric:        Behavior: Behavior normal.     Ortho Exam negative Tinel's over the median nerve the right wrist.  Able to oppose thumb to little finger.  Has some very minimal triggering of the thumb and long finger but easily mobile and no swelling.  Specialty Comments:  No specialty comments available.  Imaging: No results found.   PMFS History: Patient Active Problem List   Diagnosis Date Noted  . Age-related osteoporosis without current pathological fracture 07/30/2020  . Hardening of the aorta (main artery of the heart) ([place]) 07/30/2020  . Heart failure ([place]) 07/30/2020  . Intestinal malabsorption 07/30/2020  . Mild intermittent asthma 07/30/2020  . Pure hypercholesterolemia 07/30/2020  . Thrombophilia ([place]) 07/30/2020  . Neck pain 04/03/2020  . Long term (current) use of anticoagulants 03/05/2020  . Unilateral primary osteoarthritis, right knee 01/26/2020  . BPPV (benign  paroxysmal positional vertigo), right 05/13/2019  . Anticoagulated 05/13/2019  . Epistaxis 05/13/2019  . Carpal tunnel syndrome, right upper limb 04/12/2019  . Trigger thumb, left thumb 02/10/2019  . Numbness and tingling in right hand 02/10/2019  . Pain in left foot 02/10/2019  . Closed fracture of right proximal tibia 06/22/2018  . Paroxysmal atrial fibrillation ([place]) 05/29/2018  . Hypokalemia 05/29/2018  . Pain and swelling of right lower extremity 05/28/2018  . Closed fracture of shaft of tibia 04/29/2018  . Aftercare 04/23/2018  . SAH (subarachnoid hemorrhage) ([place]) 04/02/2018  . Chronic bilateral low back pain with bilateral sciatica 03/08/2018  . Abnormal CT  scan, chest 08/08/2016  . Hearing loss 09/10/2015  . Impacted cerumen of right ear 09/10/2015  . Otorrhea of left ear 09/10/2015  . Tympanic membrane perforation, left 09/10/2015  . Near syncope 01/06/2015  . Left leg weakness 01/06/2015  . Hyperlipidemia   . Hypothyroidism   . Ventricular ectopy   . Chest pain 05/22/2014  . Left bundle branch block 05/22/2014  . PVC's (premature ventricular contractions) 05/22/2014  . HOARSENESS 02/14/2010  . CHEST PAIN, ATYPICAL 02/14/2010  . Allergic rhinitis 02/13/2010  . G E R D 02/13/2010   Past Medical History:  Diagnosis Date  . Arthritis   . Asthma   . GERD (gastroesophageal reflux disease)   . High cholesterol   . Hypothyroidism   . LBBB (left bundle branch block)   . Ventricular ectopy     Family History  Problem Relation Age of Onset  . Heart attack Mother   . Throat cancer Father   . Congestive Heart Failure Sister   . Congestive Heart Failure Brother     Past Surgical History:  Procedure Laterality Date  . CARDIOVERSION N/A 11/27/2015   Procedure: CARDIOVERSION;  Surgeon: Josue Hector, MD;  Location[facility] ENDOSCOPY;  Service: Cardiovascular;  Laterality: N/A;  . KNEE ARTHROSCOPY    . TIBIA IM NAIL INSERTION Right 04/05/2018   Procedure: INTRAMEDULLARY (IM) NAIL TIBIAL;  Surgeon: Nicholes Stairs, MD;  Location: [facility];  Service: Orthopedics;  Laterality: Right;   Social History   Occupational History  . Not on file  Tobacco Use  . Smoking status: Passive Smoke Exposure - Never Smoker  . Smokeless tobacco: Never Used  Vaping Use  . Vaping Use: Never used  Substance and Sexual Activity  . Alcohol use: No    Alcohol/week: 0.0 standard drinks  . Drug use: No  . Sexual activity: Not on file

## 2020-09-13 ENCOUNTER — Ambulatory Visit: Payer: Medicare Other | Admitting: Cardiology

## 2020-09-13 ENCOUNTER — Encounter: Payer: Self-pay | Admitting: Cardiology

## 2020-09-13 VITALS — BP 102/70 | HR 73 | Ht 62.0 in | Wt 148.4 lb

## 2020-09-13 DIAGNOSIS — I5032 Chronic diastolic (congestive) heart failure: Secondary | ICD-10-CM | POA: Diagnosis not present

## 2020-09-13 DIAGNOSIS — I447 Left bundle-branch block, unspecified: Secondary | ICD-10-CM

## 2020-09-13 DIAGNOSIS — E039 Hypothyroidism, unspecified: Secondary | ICD-10-CM

## 2020-09-13 DIAGNOSIS — I10 Essential (primary) hypertension: Secondary | ICD-10-CM

## 2020-09-13 DIAGNOSIS — I480 Paroxysmal atrial fibrillation: Secondary | ICD-10-CM

## 2020-09-13 NOTE — Patient Instructions (Signed)
Medication Instructions:  Your physician recommends that you continue on your current medications as directed. Please refer to the Current Medication list given to you today.  *If you need a refill on your cardiac medications before your next appointment, please call your pharmacy*   Lab Work: None ordered  If you have labs (blood work) drawn today and your tests are completely normal, you will receive your results only by: Marland Kitchen MyChart Message (if you have MyChart) OR . A paper copy in the mail If you have any lab test that is abnormal or we need to change your treatment, we will call you to review the results.   Testing/Procedures: None ordered   Follow-Up: At [facility], you and your health needs are our priority.  As part of our continuing mission to provide you with exceptional heart care, we have created designated Provider Care Teams.  These Care Teams include your primary Cardiologist (physician) and Advanced Practice Providers (APPs -  Physician Assistants and Nurse Practitioners) who all work together to provide you with the care you need, when you need it.  We recommend signing up for the patient portal called "MyChart".  Sign up information is provided on this After Visit Summary.  MyChart is used to connect with patients for Virtual Visits (Telemedicine).  Patients are able to view lab/test results, encounter notes, upcoming appointments, etc.  Non-urgent messages can be sent to your provider as well.   To learn more about what you can do with MyChart, go to [email or web address].    Your next appointment:   12 month(s)  The format for your next appointment:   In Person  Provider:   You may see Jenkins Rouge, MD or one of the following Advanced Practice Providers on your designated Care Team:    Kathyrn Drown, NP    Other Instructions
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
## 2020-09-18 ENCOUNTER — Ambulatory Visit: Payer: Medicare Other | Admitting: Cardiovascular Disease

## 2020-09-20 DIAGNOSIS — M810 Age-related osteoporosis without current pathological fracture: Secondary | ICD-10-CM | POA: Diagnosis not present

## 2020-10-16 DIAGNOSIS — K219 Gastro-esophageal reflux disease without esophagitis: Secondary | ICD-10-CM | POA: Diagnosis not present

## 2020-10-16 DIAGNOSIS — I480 Paroxysmal atrial fibrillation: Secondary | ICD-10-CM | POA: Diagnosis not present

## 2020-10-16 DIAGNOSIS — E039 Hypothyroidism, unspecified: Secondary | ICD-10-CM | POA: Diagnosis not present

## 2020-10-16 DIAGNOSIS — J4530 Mild persistent asthma, uncomplicated: Secondary | ICD-10-CM | POA: Diagnosis not present

## 2020-10-16 DIAGNOSIS — M810 Age-related osteoporosis without current pathological fracture: Secondary | ICD-10-CM | POA: Diagnosis not present

## 2020-10-16 DIAGNOSIS — I509 Heart failure, unspecified: Secondary | ICD-10-CM | POA: Diagnosis not present

## 2020-10-16 DIAGNOSIS — E7800 Pure hypercholesterolemia, unspecified: Secondary | ICD-10-CM | POA: Diagnosis not present

## 2020-10-24 DIAGNOSIS — R040 Epistaxis: Secondary | ICD-10-CM | POA: Diagnosis not present

## 2020-10-24 DIAGNOSIS — H9212 Otorrhea, left ear: Secondary | ICD-10-CM | POA: Diagnosis not present

## 2020-10-24 DIAGNOSIS — H7292 Unspecified perforation of tympanic membrane, left ear: Secondary | ICD-10-CM | POA: Diagnosis not present

## 2020-10-24 DIAGNOSIS — Z7901 Long term (current) use of anticoagulants: Secondary | ICD-10-CM | POA: Diagnosis not present

## 2020-10-26 DIAGNOSIS — R040 Epistaxis: Secondary | ICD-10-CM | POA: Diagnosis not present

## 2020-10-26 DIAGNOSIS — H7292 Unspecified perforation of tympanic membrane, left ear: Secondary | ICD-10-CM | POA: Diagnosis not present

## 2020-10-26 DIAGNOSIS — Z7901 Long term (current) use of anticoagulants: Secondary | ICD-10-CM | POA: Diagnosis not present

## 2020-11-09 DIAGNOSIS — E039 Hypothyroidism, unspecified: Secondary | ICD-10-CM | POA: Diagnosis not present

## 2020-11-09 DIAGNOSIS — I480 Paroxysmal atrial fibrillation: Secondary | ICD-10-CM | POA: Diagnosis not present

## 2020-11-09 DIAGNOSIS — M810 Age-related osteoporosis without current pathological fracture: Secondary | ICD-10-CM | POA: Diagnosis not present

## 2020-11-09 DIAGNOSIS — K219 Gastro-esophageal reflux disease without esophagitis: Secondary | ICD-10-CM | POA: Diagnosis not present

## 2020-11-09 DIAGNOSIS — I509 Heart failure, unspecified: Secondary | ICD-10-CM | POA: Diagnosis not present

## 2020-11-09 DIAGNOSIS — E7800 Pure hypercholesterolemia, unspecified: Secondary | ICD-10-CM | POA: Diagnosis not present

## 2020-11-09 DIAGNOSIS — J4530 Mild persistent asthma, uncomplicated: Secondary | ICD-10-CM | POA: Diagnosis not present

## 2020-11-12 ENCOUNTER — Ambulatory Visit: Payer: Medicare Other | Admitting: Podiatry

## 2020-11-20 ENCOUNTER — Encounter: Payer: Self-pay | Admitting: Podiatry

## 2020-11-20 ENCOUNTER — Ambulatory Visit: Payer: Medicare Other | Admitting: Podiatry

## 2020-11-20 ENCOUNTER — Other Ambulatory Visit: Payer: Self-pay

## 2020-11-20 DIAGNOSIS — D689 Coagulation defect, unspecified: Secondary | ICD-10-CM

## 2020-11-20 DIAGNOSIS — M79676 Pain in unspecified toe(s): Secondary | ICD-10-CM | POA: Diagnosis not present

## 2020-11-20 DIAGNOSIS — B351 Tinea unguium: Secondary | ICD-10-CM

## 2020-11-20 DIAGNOSIS — I739 Peripheral vascular disease, unspecified: Secondary | ICD-10-CM

## 2020-11-20 DIAGNOSIS — L84 Corns and callosities: Secondary | ICD-10-CM

## 2020-11-20 NOTE — Progress Notes (Signed)
This patient returns to my office for at risk foot care.  This patient requires this care by a professional since this patient will be at risk due to having coagulation defect and PAD.  This patient is unable to cut nails herself since the patient cannot reach her nails.These nails are painful walking and wearing shoes.  This patient presents for at risk foot care today.  General Appearance  Alert, conversant and in no acute stress.  Vascular  Dorsalis pedis  pulses are palpable  bilaterally. Posterior tibial pulses are not palpable. Capillary return is within normal limits  bilaterally. Temperature is within normal limits  bilaterally.  Neurologic  Senn-Weinstein monofilament wire test within normal limits  bilaterally. Muscle power within normal limits bilaterally.  Nails Thick disfigured discolored nails with subungual debris  from hallux to fifth toes bilaterally. No evidence of bacterial infection or drainage bilaterally.  Orthopedic  No limitations of motion  feet .  No crepitus or effusions noted.  No bony pathology or digital deformities noted.  Skin  normotropic skin with no porokeratosis noted bilaterally.  No signs of infections or ulcers noted.  Pinch callus right hallux.   Onychomycosis  Pain in right toes  Pain in left toes Pinch Callus  Consent was obtained for treatment procedures.   Mechanical debridement of nails 1-5  bilaterally performed with a nail nipper.  Filed with dremel without incident. Debride pinch callus followed by padding.   Return office visit  3 months                    Told patient to return for periodic foot care and evaluation due to potential at risk complications.   Gardiner Barefoot DPM

## 2020-12-07 DIAGNOSIS — E7800 Pure hypercholesterolemia, unspecified: Secondary | ICD-10-CM | POA: Diagnosis not present

## 2020-12-07 DIAGNOSIS — I509 Heart failure, unspecified: Secondary | ICD-10-CM | POA: Diagnosis not present

## 2020-12-07 DIAGNOSIS — K219 Gastro-esophageal reflux disease without esophagitis: Secondary | ICD-10-CM | POA: Diagnosis not present

## 2020-12-07 DIAGNOSIS — E039 Hypothyroidism, unspecified: Secondary | ICD-10-CM | POA: Diagnosis not present

## 2020-12-07 DIAGNOSIS — I480 Paroxysmal atrial fibrillation: Secondary | ICD-10-CM | POA: Diagnosis not present

## 2020-12-07 DIAGNOSIS — M810 Age-related osteoporosis without current pathological fracture: Secondary | ICD-10-CM | POA: Diagnosis not present

## 2020-12-07 DIAGNOSIS — J4530 Mild persistent asthma, uncomplicated: Secondary | ICD-10-CM | POA: Diagnosis not present

## 2021-01-01 DIAGNOSIS — Z974 Presence of external hearing-aid: Secondary | ICD-10-CM | POA: Diagnosis not present

## 2021-01-01 DIAGNOSIS — Z8709 Personal history of other diseases of the respiratory system: Secondary | ICD-10-CM | POA: Diagnosis not present

## 2021-01-01 DIAGNOSIS — H938X2 Other specified disorders of left ear: Secondary | ICD-10-CM | POA: Diagnosis not present

## 2021-01-01 DIAGNOSIS — Z8669 Personal history of other diseases of the nervous system and sense organs: Secondary | ICD-10-CM | POA: Diagnosis not present

## 2021-01-01 DIAGNOSIS — H6121 Impacted cerumen, right ear: Secondary | ICD-10-CM | POA: Diagnosis not present

## 2021-01-07 DIAGNOSIS — I509 Heart failure, unspecified: Secondary | ICD-10-CM | POA: Diagnosis not present

## 2021-01-07 DIAGNOSIS — I480 Paroxysmal atrial fibrillation: Secondary | ICD-10-CM | POA: Diagnosis not present

## 2021-01-07 DIAGNOSIS — E7800 Pure hypercholesterolemia, unspecified: Secondary | ICD-10-CM | POA: Diagnosis not present

## 2021-01-07 DIAGNOSIS — M810 Age-related osteoporosis without current pathological fracture: Secondary | ICD-10-CM | POA: Diagnosis not present

## 2021-01-07 DIAGNOSIS — J4530 Mild persistent asthma, uncomplicated: Secondary | ICD-10-CM | POA: Diagnosis not present

## 2021-01-07 DIAGNOSIS — E039 Hypothyroidism, unspecified: Secondary | ICD-10-CM | POA: Diagnosis not present

## 2021-01-07 DIAGNOSIS — K219 Gastro-esophageal reflux disease without esophagitis: Secondary | ICD-10-CM | POA: Diagnosis not present

## 2021-02-05 DIAGNOSIS — I480 Paroxysmal atrial fibrillation: Secondary | ICD-10-CM | POA: Diagnosis not present

## 2021-02-05 DIAGNOSIS — E039 Hypothyroidism, unspecified: Secondary | ICD-10-CM | POA: Diagnosis not present

## 2021-02-05 DIAGNOSIS — E7800 Pure hypercholesterolemia, unspecified: Secondary | ICD-10-CM | POA: Diagnosis not present

## 2021-02-05 DIAGNOSIS — K219 Gastro-esophageal reflux disease without esophagitis: Secondary | ICD-10-CM | POA: Diagnosis not present

## 2021-02-05 DIAGNOSIS — I509 Heart failure, unspecified: Secondary | ICD-10-CM | POA: Diagnosis not present

## 2021-02-05 DIAGNOSIS — J4530 Mild persistent asthma, uncomplicated: Secondary | ICD-10-CM | POA: Diagnosis not present

## 2021-02-11 ENCOUNTER — Encounter: Payer: Self-pay | Admitting: Podiatry

## 2021-02-11 ENCOUNTER — Other Ambulatory Visit: Payer: Self-pay

## 2021-02-11 ENCOUNTER — Ambulatory Visit: Payer: Medicare Other | Admitting: Podiatry

## 2021-02-11 DIAGNOSIS — M79676 Pain in unspecified toe(s): Secondary | ICD-10-CM | POA: Diagnosis not present

## 2021-02-11 DIAGNOSIS — D689 Coagulation defect, unspecified: Secondary | ICD-10-CM

## 2021-02-11 DIAGNOSIS — I739 Peripheral vascular disease, unspecified: Secondary | ICD-10-CM | POA: Diagnosis not present

## 2021-02-11 DIAGNOSIS — B351 Tinea unguium: Secondary | ICD-10-CM

## 2021-02-11 NOTE — Progress Notes (Signed)
This patient returns to my office for at risk foot care.  This patient requires this care by a professional since this patient will be at risk due to having coagulation defect and PAD.  This patient is unable to cut nails herself since the patient cannot reach her nails.These nails are painful walking and wearing shoes.  This patient presents for at risk foot care today.  General Appearance  Alert, conversant and in no acute stress.  Vascular  Dorsalis pedis  pulses are palpable  bilaterally. Posterior tibial pulses are not palpable. Capillary return is within normal limits  bilaterally. Temperature is within normal limits  bilaterally.  Neurologic  Senn-Weinstein monofilament wire test within normal limits  bilaterally. Muscle power within normal limits bilaterally.  Nails Thick disfigured discolored nails with subungual debris  from hallux to fifth toes bilaterally. No evidence of bacterial infection or drainage bilaterally.  Orthopedic  No limitations of motion  feet .  No crepitus or effusions noted.  No bony pathology or digital deformities noted.  Skin  normotropic skin with no porokeratosis noted bilaterally.  No signs of infections or ulcers noted.  Pinch callus right hallux.   Onychomycosis  Pain in right toes  Pain in left toes Pinch Callus  Consent was obtained for treatment procedures.   Mechanical debridement of nails 1-5  bilaterally performed with a nail nipper.  Filed with dremel without incident. Debride pinch callus followed by padding.   Return office visit  3 months                    Told patient to return for periodic foot care and evaluation due to potential at risk complications.   Gardiner Barefoot DPM

## 2021-02-19 ENCOUNTER — Ambulatory Visit: Payer: Medicare Other | Admitting: Podiatry

## 2021-02-19 DIAGNOSIS — K9089 Other intestinal malabsorption: Secondary | ICD-10-CM | POA: Diagnosis not present

## 2021-02-19 DIAGNOSIS — E7800 Pure hypercholesterolemia, unspecified: Secondary | ICD-10-CM | POA: Diagnosis not present

## 2021-02-19 DIAGNOSIS — I509 Heart failure, unspecified: Secondary | ICD-10-CM | POA: Diagnosis not present

## 2021-02-19 DIAGNOSIS — K219 Gastro-esophageal reflux disease without esophagitis: Secondary | ICD-10-CM | POA: Diagnosis not present

## 2021-02-19 DIAGNOSIS — R21 Rash and other nonspecific skin eruption: Secondary | ICD-10-CM | POA: Diagnosis not present

## 2021-02-19 DIAGNOSIS — I700 Atherosclerosis of aorta: Secondary | ICD-10-CM | POA: Diagnosis not present

## 2021-02-19 DIAGNOSIS — I480 Paroxysmal atrial fibrillation: Secondary | ICD-10-CM | POA: Diagnosis not present

## 2021-02-19 DIAGNOSIS — E039 Hypothyroidism, unspecified: Secondary | ICD-10-CM | POA: Diagnosis not present

## 2021-02-19 DIAGNOSIS — Z23 Encounter for immunization: Secondary | ICD-10-CM | POA: Diagnosis not present

## 2021-02-19 DIAGNOSIS — J4530 Mild persistent asthma, uncomplicated: Secondary | ICD-10-CM | POA: Diagnosis not present

## 2021-02-19 DIAGNOSIS — Z0000 Encounter for general adult medical examination without abnormal findings: Secondary | ICD-10-CM | POA: Diagnosis not present

## 2021-02-19 DIAGNOSIS — Z79899 Other long term (current) drug therapy: Secondary | ICD-10-CM | POA: Diagnosis not present

## 2021-02-19 DIAGNOSIS — Z1389 Encounter for screening for other disorder: Secondary | ICD-10-CM | POA: Diagnosis not present

## 2021-03-07 DIAGNOSIS — K219 Gastro-esophageal reflux disease without esophagitis: Secondary | ICD-10-CM | POA: Diagnosis not present

## 2021-03-07 DIAGNOSIS — E7800 Pure hypercholesterolemia, unspecified: Secondary | ICD-10-CM | POA: Diagnosis not present

## 2021-03-07 DIAGNOSIS — I509 Heart failure, unspecified: Secondary | ICD-10-CM | POA: Diagnosis not present

## 2021-03-07 DIAGNOSIS — J4530 Mild persistent asthma, uncomplicated: Secondary | ICD-10-CM | POA: Diagnosis not present

## 2021-03-07 DIAGNOSIS — E039 Hypothyroidism, unspecified: Secondary | ICD-10-CM | POA: Diagnosis not present

## 2021-03-07 DIAGNOSIS — I480 Paroxysmal atrial fibrillation: Secondary | ICD-10-CM | POA: Diagnosis not present

## 2021-03-15 DIAGNOSIS — Z79899 Other long term (current) drug therapy: Secondary | ICD-10-CM | POA: Diagnosis not present

## 2021-03-21 DIAGNOSIS — M810 Age-related osteoporosis without current pathological fracture: Secondary | ICD-10-CM | POA: Diagnosis not present

## 2021-04-03 DIAGNOSIS — H6123 Impacted cerumen, bilateral: Secondary | ICD-10-CM | POA: Diagnosis not present

## 2021-04-03 DIAGNOSIS — Z974 Presence of external hearing-aid: Secondary | ICD-10-CM | POA: Diagnosis not present

## 2021-04-03 DIAGNOSIS — H7292 Unspecified perforation of tympanic membrane, left ear: Secondary | ICD-10-CM | POA: Diagnosis not present

## 2021-05-14 ENCOUNTER — Ambulatory Visit: Payer: Medicare Other | Admitting: Podiatry

## 2021-05-20 ENCOUNTER — Other Ambulatory Visit: Payer: Self-pay

## 2021-05-20 ENCOUNTER — Ambulatory Visit: Payer: Medicare Other | Admitting: Podiatry

## 2021-05-20 DIAGNOSIS — B351 Tinea unguium: Secondary | ICD-10-CM

## 2021-05-20 DIAGNOSIS — M79676 Pain in unspecified toe(s): Secondary | ICD-10-CM

## 2021-05-20 NOTE — Progress Notes (Signed)
°  Subjective:  Patient ID: Samantha [name], female    DOB: [birth year],  MRN: [id number]  Chief Complaint  Patient presents with   Nail Problem    Thick painful toenails, 3 month follow up    87 y.o. female presents with the above complaint. History confirmed with patient.  She has thickened elongated toenails on both toes.  They often taken the corners and hurt in person on the other toes as well.  She is unable to cut them.  She is on Eliquis.  Objective:  Physical Exam: warm, good capillary refill, no trophic changes or ulcerative lesions, normal DP and PT pulses, and normal sensory exam. Left Foot: dystrophic yellowed discolored nail plates with subungual debris Right Foot: dystrophic yellowed discolored nail plates with subungual debris  Assessment:   1. Pain due to onychomycosis of toenail      Plan:  Patient was evaluated and treated and all questions answered.  Discussed the etiology and treatment options for the condition in detail with the patient. Educated patient on the topical and oral treatment options for mycotic nails. Recommended debridement of the nails today. Sharp and mechanical debridement performed of all painful and mycotic nails today. Nails debrided in length and thickness using a nail nipper to level of comfort. Discussed treatment options including appropriate shoe gear. Follow up as needed for painful nails.    Return in about 10 weeks (around 07/29/2021) for at risk foot care.

## 2021-06-27 DIAGNOSIS — K219 Gastro-esophageal reflux disease without esophagitis: Secondary | ICD-10-CM | POA: Diagnosis not present

## 2021-06-27 DIAGNOSIS — I480 Paroxysmal atrial fibrillation: Secondary | ICD-10-CM | POA: Diagnosis not present

## 2021-06-27 DIAGNOSIS — E039 Hypothyroidism, unspecified: Secondary | ICD-10-CM | POA: Diagnosis not present

## 2021-06-27 DIAGNOSIS — J4530 Mild persistent asthma, uncomplicated: Secondary | ICD-10-CM | POA: Diagnosis not present

## 2021-06-27 DIAGNOSIS — E7800 Pure hypercholesterolemia, unspecified: Secondary | ICD-10-CM | POA: Diagnosis not present

## 2021-06-27 DIAGNOSIS — I509 Heart failure, unspecified: Secondary | ICD-10-CM | POA: Diagnosis not present

## 2021-07-04 DIAGNOSIS — H9212 Otorrhea, left ear: Secondary | ICD-10-CM | POA: Diagnosis not present

## 2021-07-04 DIAGNOSIS — H6121 Impacted cerumen, right ear: Secondary | ICD-10-CM | POA: Diagnosis not present

## 2021-07-04 DIAGNOSIS — H7292 Unspecified perforation of tympanic membrane, left ear: Secondary | ICD-10-CM | POA: Diagnosis not present

## 2021-07-05 DIAGNOSIS — K219 Gastro-esophageal reflux disease without esophagitis: Secondary | ICD-10-CM | POA: Diagnosis not present

## 2021-07-05 DIAGNOSIS — J4530 Mild persistent asthma, uncomplicated: Secondary | ICD-10-CM | POA: Diagnosis not present

## 2021-07-05 DIAGNOSIS — I480 Paroxysmal atrial fibrillation: Secondary | ICD-10-CM | POA: Diagnosis not present

## 2021-07-29 ENCOUNTER — Ambulatory Visit: Payer: Medicare Other | Admitting: Podiatry

## 2021-07-29 ENCOUNTER — Other Ambulatory Visit: Payer: Self-pay

## 2021-07-29 DIAGNOSIS — B351 Tinea unguium: Secondary | ICD-10-CM | POA: Diagnosis not present

## 2021-07-29 DIAGNOSIS — M79676 Pain in unspecified toe(s): Secondary | ICD-10-CM

## 2021-07-30 NOTE — Progress Notes (Signed)
°  Subjective:  Patient ID: Samantha [name], female    DOB: 21-Apr-1930,  MRN: [id number]  Chief Complaint  Patient presents with   Nail Problem   Callouses    88 y.o. female presents with the above complaint. History confirmed with patient.  She has thickened elongated toenails on both toes.  They often taken the corners and hurt in person on the other toes as well.  She is unable to cut them.  She is on Eliquis.  Objective:  Physical Exam: warm, good capillary refill, no trophic changes or ulcerative lesions, normal DP and PT pulses, and normal sensory exam. Left Foot: dystrophic yellowed discolored nail plates with subungual debris Right Foot: dystrophic yellowed discolored nail plates with subungual debris  Assessment:   1. Pain due to onychomycosis of toenail      Plan:  Patient was evaluated and treated and all questions answered.  Discussed the etiology and treatment options for the condition in detail with the patient. Educated patient on the topical and oral treatment options for mycotic nails. Recommended debridement of the nails today. Sharp and mechanical debridement performed of all painful and mycotic nails today. Nails debrided in length and thickness using a nail nipper to level of comfort. Discussed treatment options including appropriate shoe gear. Follow up as needed for painful nails.    Return in about 2 months (around 10/03/2021) for painful nails .

## 2021-08-05 DIAGNOSIS — I509 Heart failure, unspecified: Secondary | ICD-10-CM | POA: Diagnosis not present

## 2021-08-05 DIAGNOSIS — E7800 Pure hypercholesterolemia, unspecified: Secondary | ICD-10-CM | POA: Diagnosis not present

## 2021-08-05 DIAGNOSIS — I480 Paroxysmal atrial fibrillation: Secondary | ICD-10-CM | POA: Diagnosis not present

## 2021-08-06 ENCOUNTER — Encounter: Payer: Self-pay | Admitting: Emergency Medicine

## 2021-08-06 ENCOUNTER — Other Ambulatory Visit: Payer: Self-pay

## 2021-08-06 ENCOUNTER — Ambulatory Visit: Payer: Medicare Other | Admitting: Emergency Medicine

## 2021-08-06 DIAGNOSIS — J4520 Mild intermittent asthma, uncomplicated: Secondary | ICD-10-CM

## 2021-08-06 DIAGNOSIS — K219 Gastro-esophageal reflux disease without esophagitis: Secondary | ICD-10-CM | POA: Diagnosis not present

## 2021-08-06 DIAGNOSIS — J301 Allergic rhinitis due to pollen: Secondary | ICD-10-CM

## 2021-08-06 NOTE — Progress Notes (Signed)
? ?  Subjective:  ? ? Patient ID: Samantha [name], female    DOB: 1929/06/25, 88 y.o.   MRN: [id number] ? ?HPI ? ?ROV 08/01/20 --follow-up visit for pleasant 88 year old woman with A. fib, allergic rhinitis, GERD and mild intermittent asthma.  She also has allergic rhinitis that is a contributor.  At her last visit we tried coming off of scheduled ICS/LABA see if she would tolerate.  She remained on Protonix twice daily, loratadine, Singulair.  Today she reports that she hasn't missed the [facility]. She has albuterol, but never needs it. She is active, does not have any dyspnea currently. She has more trouble during the Summer months. Cough is controlled. No wheeze.  ? ?ROV 08/06/21 --88 year old woman whom I have followed for mild intermittent asthma.  She has a history of allergic rhinitis and GERD which are both contributors to her asthma. ?PMH: A-fib, hyperlipidemia ?She reports today that her breathing is stable - she isn't very active, doesn't walk quickly but is able to get around. She is able to shop for her groceries.  ?Currently managed on loratadine, Singulair, omeprazole.  Has albuterol and uses very rarely. No flares, no abx pred. Minimal cough, no wheeze.  ? ? ?Review of Systems  ?Constitutional:  Negative for fever and unexpected weight change.  ?HENT:  Negative for congestion, dental problem, ear pain, nosebleeds, postnasal drip, rhinorrhea, sinus pressure, sneezing, sore throat and trouble swallowing.   ?Eyes:  Negative for redness and itching.  ?Respiratory:  Positive for shortness of breath. Negative for cough, chest tightness and wheezing.   ?Cardiovascular:  Negative for palpitations and leg swelling.  ?Gastrointestinal:  Negative for nausea and vomiting.  ?Genitourinary:  Negative for dysuria.  ?Musculoskeletal:  Negative for joint swelling.  ?Skin:  Negative for rash.  ?Neurological:  Negative for headaches.  ?Hematological:  Bruises/bleeds easily.  ?Psychiatric/Behavioral:  Negative for dysphoric mood.  The patient is not nervous/anxious.   ? ? ?   ?Objective:  ? Physical Exam ?Vitals:  ? 08/06/21 1450  ?BP: 118/68  ?Pulse: (!) 101  ?Temp: 97.8 ?F (36.6 ?C)  ?TempSrc: Oral  ?SpO2: 96%  ?Weight: 150 lb 3.2 oz (68.1 kg)  ?Height: '5\' 1"'$  (1.549 m)  ? ?Gen: Pleasant, elderly overwt, in no distress,  normal affect ? ?ENT: No lesions,  mouth clear,  oropharynx clear, no postnasal drip ? ?Neck: No JVD, no stridor ? ?Lungs: No use of accessory muscles, clear without rales or rhonchi, no crackles ? ?Cardiovascular: irregular,  no peripheral edema ? ?Musculoskeletal: No deformities, no cyanosis or clubbing ? ?Neuro: alert, non focal ? ?Skin: Warm, no lesions or rashes ? ? ? ?   ?Assessment & Plan:  ?Mild intermittent asthma ?Doing very well.  No flares.  Minimal symptoms and never needs albuterol.  She does sometimes have to clear yellow mucus but not every day.  Decent functional capacity, sometimes limited by joint pain.  Plan to continue current regimen off scheduled ICS or BD, manage her GERD and rhinitis. ? ?Please keep your albuterol available to use 2 puffs if needed for shortness of breath, chest tightness, wheezing. ?Keep your flu shot and COVID-19 vaccine up-to-date ?Follow Dr. Lamonte Sakai in 1 year or sooner if you have any problems. ? ?Allergic rhinitis ?Continue loratadine, Singulair as you have been taking them ? ?G E R D ?Continue your omeprazole as you have been taking ? ?Baltazar Apo, MD, PhD ?08/06/2021, 3:05 PM ?[facility] ?[phone] or if no answer [phone] ? ?

## 2021-08-06 NOTE — Assessment & Plan Note (Signed)
Continue loratadine, Singulair as you have been taking them ?

## 2021-08-06 NOTE — Patient Instructions (Signed)
Please keep your albuterol available to use 2 puffs if needed for shortness of breath, chest tightness, wheezing. ?Keep your flu shot and COVID-19 vaccine up-to-date ?Continue loratadine, Singulair as you have been taking them ?Continue your omeprazole as you have been taking it ?Follow Dr. Lamonte Sakai in 1 year or sooner if you have any problems. ?

## 2021-08-06 NOTE — Assessment & Plan Note (Signed)
Doing very well.  No flares.  Minimal symptoms and never needs albuterol.  She does sometimes have to clear yellow mucus but not every day.  Decent functional capacity, sometimes limited by joint pain.  Plan to continue current regimen off scheduled ICS or BD, manage her GERD and rhinitis. ? ?Please keep your albuterol available to use 2 puffs if needed for shortness of breath, chest tightness, wheezing. ?Keep your flu shot and COVID-19 vaccine up-to-date ?Follow Dr. Lamonte Sakai in 1 year or sooner if you have any problems. ?

## 2021-08-06 NOTE — Assessment & Plan Note (Signed)
Continue your omeprazole as you have been taking ?

## 2021-08-20 DIAGNOSIS — H5203 Hypermetropia, bilateral: Secondary | ICD-10-CM | POA: Diagnosis not present

## 2021-08-20 DIAGNOSIS — H353131 Nonexudative age-related macular degeneration, bilateral, early dry stage: Secondary | ICD-10-CM | POA: Diagnosis not present

## 2021-08-20 DIAGNOSIS — H52203 Unspecified astigmatism, bilateral: Secondary | ICD-10-CM | POA: Diagnosis not present

## 2021-08-21 DIAGNOSIS — Z79899 Other long term (current) drug therapy: Secondary | ICD-10-CM | POA: Diagnosis not present

## 2021-08-21 DIAGNOSIS — I700 Atherosclerosis of aorta: Secondary | ICD-10-CM | POA: Diagnosis not present

## 2021-08-21 DIAGNOSIS — D6869 Other thrombophilia: Secondary | ICD-10-CM | POA: Diagnosis not present

## 2021-08-21 DIAGNOSIS — I480 Paroxysmal atrial fibrillation: Secondary | ICD-10-CM | POA: Diagnosis not present

## 2021-08-21 DIAGNOSIS — E611 Iron deficiency: Secondary | ICD-10-CM | POA: Diagnosis not present

## 2021-08-21 DIAGNOSIS — I509 Heart failure, unspecified: Secondary | ICD-10-CM | POA: Diagnosis not present

## 2021-08-21 DIAGNOSIS — E7800 Pure hypercholesterolemia, unspecified: Secondary | ICD-10-CM | POA: Diagnosis not present

## 2021-08-30 ENCOUNTER — Ambulatory Visit: Payer: Medicare Other | Admitting: Podiatry

## 2021-09-09 DIAGNOSIS — R300 Dysuria: Secondary | ICD-10-CM | POA: Diagnosis not present

## 2021-09-09 DIAGNOSIS — L298 Other pruritus: Secondary | ICD-10-CM | POA: Diagnosis not present

## 2021-09-19 DIAGNOSIS — M810 Age-related osteoporosis without current pathological fracture: Secondary | ICD-10-CM | POA: Diagnosis not present

## 2021-09-30 ENCOUNTER — Ambulatory Visit: Payer: Medicare Other | Admitting: Cardiology

## 2021-10-01 ENCOUNTER — Other Ambulatory Visit: Payer: Self-pay | Admitting: Geriatric Medicine

## 2021-10-01 ENCOUNTER — Ambulatory Visit: Payer: Medicare Other | Admitting: Physician Assistant

## 2021-10-01 ENCOUNTER — Ambulatory Visit
Admission: RE | Admit: 2021-10-01 | Discharge: 2021-10-01 | Disposition: A | Discharge status: Home / Self Care | Payer: Medicare Other | Source: Ambulatory Visit | Attending: Geriatric Medicine | Admitting: Geriatric Medicine

## 2021-10-01 DIAGNOSIS — R059 Cough, unspecified: Secondary | ICD-10-CM | POA: Diagnosis not present

## 2021-10-01 DIAGNOSIS — R051 Acute cough: Secondary | ICD-10-CM
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
## 2021-10-04 ENCOUNTER — Encounter: Payer: Self-pay | Admitting: Nurse Practitioner

## 2021-10-04 ENCOUNTER — Ambulatory Visit: Payer: Medicare Other | Admitting: Nurse Practitioner

## 2021-10-04 VITALS — BP 112/62 | HR 112 | Ht 61.0 in | Wt 148.2 lb

## 2021-10-04 DIAGNOSIS — I5032 Chronic diastolic (congestive) heart failure: Secondary | ICD-10-CM | POA: Diagnosis not present

## 2021-10-04 DIAGNOSIS — I340 Nonrheumatic mitral (valve) insufficiency: Secondary | ICD-10-CM

## 2021-10-04 DIAGNOSIS — R000 Tachycardia, unspecified: Secondary | ICD-10-CM | POA: Diagnosis not present

## 2021-10-04 DIAGNOSIS — I358 Other nonrheumatic aortic valve disorders: Secondary | ICD-10-CM | POA: Diagnosis not present

## 2021-10-04 DIAGNOSIS — I480 Paroxysmal atrial fibrillation: Secondary | ICD-10-CM | POA: Diagnosis not present

## 2021-10-04 DIAGNOSIS — I447 Left bundle-branch block, unspecified: Secondary | ICD-10-CM | POA: Diagnosis not present

## 2021-10-04 NOTE — Patient Instructions (Signed)
Medication Instructions:  ?Your physician recommends that you continue on your current medications as directed. Please refer to the Current Medication list given to you today. ? ?*If you need a refill on your cardiac medications before your next appointment, please call your pharmacy* ? ? ?Lab Work: ?None Ordered ?If you have labs (blood work) drawn today and your tests are completely normal, you will receive your results only by: ?MyChart Message (if you have MyChart) OR ?A paper copy in the mail ?If you have any lab test that is abnormal or we need to change your treatment, we will call you to review the results. ? ? ?Testing/Procedures: ?Your physician has requested that you have an echocardiogram. Echocardiography is a painless test that uses sound waves to create images of your heart. It provides your doctor with information about the size and shape of your heart and how well your heart?s chambers and valves are working. This procedure takes approximately one hour. There are no restrictions for this procedure. ? ? ? ?Follow-Up: ?At [facility], you and your health needs are our priority.  As part of our continuing mission to provide you with exceptional heart care, we have created designated Provider Care Teams.  These Care Teams include your primary Cardiologist (physician) and Advanced Practice Providers (APPs -  Physician Assistants and Nurse Practitioners) who all work together to provide you with the care you need, when you need it. ? ?We recommend signing up for the patient portal called "MyChart".  Sign up information is provided on this After Visit Summary.  MyChart is used to connect with patients for Virtual Visits (Telemedicine).  Patients are able to view lab/test results, encounter notes, upcoming appointments, etc.  Non-urgent messages can be sent to your provider as well.   ?To learn more about what you can do with MyChart, go to [email or web address].   ? ?Your next appointment:   ?3  month(s) ? ?The format for your next appointment:   ?In Person ? ?Provider:   ?Christen Bame, NP       ? ? ?Other Instructions ? ? ?Important Information About Sugar ? ? ? ? ?  ?

## 2021-10-05 ENCOUNTER — Encounter: Payer: Self-pay | Admitting: Nurse Practitioner

## 2021-10-07 ENCOUNTER — Ambulatory Visit: Payer: Medicare Other | Admitting: Podiatry

## 2021-10-07 ENCOUNTER — Encounter: Payer: Self-pay | Admitting: Podiatry

## 2021-10-07 ENCOUNTER — Encounter (HOSPITAL_COMMUNITY): Payer: Self-pay | Admitting: Emergency Medicine

## 2021-10-07 ENCOUNTER — Ambulatory Visit (HOSPITAL_COMMUNITY)
Admission: EM | Admit: 2021-10-07 | Discharge: 2021-10-07 | Disposition: A | Discharge status: Home / Self Care | Payer: Medicare Other | Attending: Emergency Medicine | Admitting: Emergency Medicine

## 2021-10-07 VITALS — Temp 97.3°F

## 2021-10-07 DIAGNOSIS — M79676 Pain in unspecified toe(s): Secondary | ICD-10-CM

## 2021-10-07 DIAGNOSIS — Q828 Other specified congenital malformations of skin: Secondary | ICD-10-CM | POA: Diagnosis not present

## 2021-10-07 DIAGNOSIS — D689 Coagulation defect, unspecified: Secondary | ICD-10-CM

## 2021-10-07 DIAGNOSIS — I739 Peripheral vascular disease, unspecified: Secondary | ICD-10-CM | POA: Diagnosis not present

## 2021-10-07 DIAGNOSIS — L84 Corns and callosities: Secondary | ICD-10-CM | POA: Diagnosis not present

## 2021-10-07 DIAGNOSIS — L03115 Cellulitis of right lower limb: Secondary | ICD-10-CM | POA: Diagnosis not present

## 2021-10-07 DIAGNOSIS — L02415 Cutaneous abscess of right lower limb: Secondary | ICD-10-CM | POA: Diagnosis not present

## 2021-10-07 DIAGNOSIS — B351 Tinea unguium: Secondary | ICD-10-CM | POA: Diagnosis not present

## 2021-10-07 MED ORDER — CEFTRIAXONE SODIUM 1 G IJ SOLR
1.0000 g | Freq: Once | INTRAMUSCULAR | Status: AC
  Administered 2021-10-07: 1 g via INTRAMUSCULAR

## 2021-10-07 MED ORDER — SULFAMETHOXAZOLE-TRIMETHOPRIM 800-160 MG PO TABS: 1.0000 | ORAL_TABLET | Freq: Two times a day (BID) | ORAL | 0 refills | Status: DC

## 2021-10-07 MED ORDER — CEFTRIAXONE SODIUM 1 G IJ SOLR
INTRAMUSCULAR | Status: AC
Start: 2021-10-07 — End: ?
  Filled 2021-10-07: qty 10

## 2021-10-07 MED ORDER — SULFAMETHOXAZOLE-TRIMETHOPRIM 800-160 MG PO TABS: 1.0000 | ORAL_TABLET | Freq: Two times a day (BID) | ORAL | 0 refills | Status: AC
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
## 2021-10-07 NOTE — Discharge Instructions (Signed)
To treat the infection in your skin on your right lower leg, also called cellulitis, we provided you with an injection of ceftriaxone during your visit today.  This is an antibiotic that you have had before when you were hospitalized. ? ?I would like for you to begin taking the sulfa medication we discussed, sulfamethoxazole trimethoprim, also called Bactrim, for the next 7 days.  Please take 1 tablet every 12 hours.  Please try to take your first dose this evening. ? ?To make monitoring a little bit easier, I used a skin marker to draw a line around the area of redness on your leg so that you can monitor the area for improving or worsening signs of infection.   ? ?In the next 12 to 24 hours, if you see the redness continued to spread outside of these lines, I urge you to go to the emergency room as soon as possible for further evaluation and treatment. ? ?For visiting urgent care today. ?

## 2021-10-07 NOTE — ED Provider Notes (Signed)
?[facility] ? ? ? ?CSN: [id number] ?Arrival date & time: 10/07/21  1410 ?  ? ?HISTORY  ? ?Chief Complaint  ?Patient presents with  ? Leg Swelling  ? ?HPI ?Samantha [name] is a 88 y.o. female. Patient presents to urgent care complaining of a lesion on her right lower thigh that she noticed 2 days ago.  Patient states initially she thought was a bruise but noticed that the area has become much redder, firm, warm and has been spreading in size.  Patient states has been applying antibiotic cream to it but this has not helped.  Patient states this morning she woke up with even more redness that is now spread down to her right lower leg.  Patient denies any recent known injury to her legs such as a tick bite.  Patient states she has a history of rod and screw in her right lower leg and that she was recently seen and treated for about a viral URI and a UTI.  Of note, earlier today patient was seen by her podiatry nurse practitioner who advised her to come to urgent care to have it evaluated.  The nurse practitioner was kind enough to take pictures of the lesions and placed them in her chart today. ? ?The history is provided by the patient.  ?Past Medical History:  ?Diagnosis Date  ? Arthritis   ? Asthma   ? GERD (gastroesophageal reflux disease)   ? High cholesterol   ? Hypothyroidism   ? LBBB (left bundle branch block)   ? Ventricular ectopy   ? ?Patient Active Problem List  ? Diagnosis Date Noted  ? Blood clotting disorder ([place]) 11/20/2020  ? Age-related osteoporosis without current pathological fracture 07/30/2020  ? Hardening of the aorta (main artery of the heart) ([place]) 07/30/2020  ? Heart failure ([place]) 07/30/2020  ? Intestinal malabsorption 07/30/2020  ? Mild intermittent asthma 07/30/2020  ? Pure hypercholesterolemia 07/30/2020  ? Thrombophilia ([place]) 07/30/2020  ? Neck pain 04/03/2020  ? Long term (current) use of anticoagulants 03/05/2020  ? Unilateral primary osteoarthritis, right knee 01/26/2020  ? BPPV  (benign paroxysmal positional vertigo), right 05/13/2019  ? Anticoagulated 05/13/2019  ? Epistaxis 05/13/2019  ? Carpal tunnel syndrome, right upper limb 04/12/2019  ? Trigger thumb, left thumb 02/10/2019  ? Numbness and tingling in right hand 02/10/2019  ? Pain in left foot 02/10/2019  ? Closed fracture of right proximal tibia 06/22/2018  ? Paroxysmal atrial fibrillation ([place]) 05/29/2018  ? Hypokalemia 05/29/2018  ? Pain and swelling of right lower extremity 05/28/2018  ? Closed fracture of shaft of tibia 04/29/2018  ? Aftercare 04/23/2018  ? SAH (subarachnoid hemorrhage) ([place]) 04/02/2018  ? Chronic bilateral low back pain with bilateral sciatica 03/08/2018  ? Abnormal CT scan, chest 08/08/2016  ? Hearing loss 09/10/2015  ? Impacted cerumen of right ear 09/10/2015  ? Otorrhea of left ear 09/10/2015  ? Tympanic membrane perforation, left 09/10/2015  ? Near syncope 01/06/2015  ? Left leg weakness 01/06/2015  ? Hyperlipidemia   ? Hypothyroidism   ? Ventricular ectopy   ? Chest pain 05/22/2014  ? Left bundle branch block 05/22/2014  ? PVC's (premature ventricular contractions) 05/22/2014  ? HOARSENESS 02/14/2010  ? CHEST PAIN, ATYPICAL 02/14/2010  ? Allergic rhinitis 02/13/2010  ? G E R D 02/13/2010  ? ?Past Surgical History:  ?Procedure Laterality Date  ? CARDIOVERSION N/A 11/27/2015  ? Procedure: CARDIOVERSION;  Surgeon: Josue Hector, MD;  Location: [facility];  Service: Cardiovascular;  Laterality: N/A;  ? KNEE ARTHROSCOPY    ? TIBIA IM NAIL INSERTION Right 04/05/2018  ? Procedure: INTRAMEDULLARY (IM) NAIL TIBIAL;  Surgeon: Nicholes Stairs, MD;  Location: [facility];  Service: Orthopedics;  Laterality: Right;  ? ?OB History   ?No obstetric history on file. ?  ? ?Home Medications   ? ?Prior to Admission medications   ?Medication Sig Start Date End Date Taking? Authorizing Provider  ?albuterol (VENTOLIN HFA) 108 (90 Base) MCG/ACT inhaler Inhale 1-2 puffs into the lungs every 6 (six) hours as needed for wheezing or  shortness of breath. 08/01/20   Collene Gobble, MD  ?apixaban (ELIQUIS) 5 MG TABS tablet Take 5 mg by mouth 2 (two) times daily.    [provider]  ?Calcium-Magnesium-Zinc (CALCIUM-MAGNESUIUM-ZINC PO) Take 1 tablet by mouth as directed. (Monday, Wednesday, Friday)    [provider]  ?Cholecalciferol (VITAMIN D) 2000 units tablet Take 2,000 Units by mouth daily.    [provider]  ?conjugated estrogens (PREMARIN) vaginal cream 1 gram 06/15/19   [provider]  ?denosumab (PROLIA) 60 MG/ML SOSY injection Inject into the skin.    [provider]  ?Ferrous Sulfate (FER-IRON PO) Take 325 mg by mouth 3 (three) times a week.    [provider]  ?furosemide (LASIX) 40 MG tablet Take 1 tablet (40 mg total) by mouth daily. ?Patient taking differently: Take 40 mg by mouth daily. Pt takes in the afternoon with her potassium tablet 10/24/15   Josue Hector, MD  ?hydrocortisone cream 1 % 1 application    [provider]  ?levothyroxine (SYNTHROID) 125 MCG tablet Take 125 mcg by mouth every morning. 08/17/20   [provider]  ?loratadine (CLARITIN) 10 MG tablet Take 10 mg by mouth daily.    [provider]  ?lovastatin (MEVACOR) 20 MG tablet Take 20 mg by mouth at bedtime.    [provider]  ?metoprolol succinate (TOPROL-XL) 50 MG 24 hr tablet Take 50 mg by mouth at bedtime. Take with or immediately following a meal.    [provider]  ?montelukast (SINGULAIR) 10 MG tablet Take 10 mg by mouth at bedtime.    [provider]  ?Multiple Vitamins-Minerals (PRESERVISION AREDS PO) Take 1 tablet by mouth 2 (two) times daily.    [provider]  ?mupirocin ointment (BACTROBAN) 2 % 3 (three) times daily. 06/06/20   [provider]  ?ofloxacin (FLOXIN) 0.3 % OTIC solution SMARTSIG:5 Drop(s) Left Ear Daily 07/04/21   [provider]  ?Omega-3 Fatty Acids (FISH OIL) 1000 MG CAPS Take 1,000 mg by mouth 3  (three) times daily.    [provider]  ?omeprazole (PRILOSEC) 40 MG capsule Take 1 capsule by mouth daily.    [provider]  ?polyethylene glycol (MIRALAX / GLYCOLAX) packet Take 8.5 g by mouth every other day.    [provider]  ?potassium chloride (K-DUR) 10 MEQ tablet Take 1 tablet (10 mEq total) by mouth daily. 10/24/15   Josue Hector, MD  ?sodium chloride (OCEAN) 0.65 % SOLN nasal spray Place 1 spray into both nostrils 2 (two) times daily.    [provider]  ?vitamin B-12 (CYANOCOBALAMIN) 100 MCG tablet Take 100 mcg by mouth as directed. Mon, wed, fri    [provider]  ? ? ?Family History ?Family History  ?Problem Relation Age of Onset  ? Heart attack Mother   ? Throat cancer Father   ? Congestive Heart Failure Sister   ?  Congestive Heart Failure Brother   ? ?Social History ?Social History  ? ?Tobacco Use  ? Smoking status: Never  ?  Passive exposure: Yes  ? Smokeless tobacco: Never  ?Vaping Use  ? Vaping Use: Never used  ?Substance Use Topics  ? Alcohol use: No  ?  Alcohol/week: 0.0 standard drinks  ? Drug use: No  ? ?Allergies   ?Erythromycin, Alendronate, Azithromycin, Bacitracin-polymyxin b, Other, Benzalkonium chloride, Erythromycin, Neosporin plus max st, Neosporin [neomycin-bacitracin zn-polymyx], and Penicillins ? ?Review of Systems ?Review of Systems ?Pertinent findings noted in history of present illness.  ? ?Physical Exam ?Triage Vital Signs ?ED Triage Vitals  ?Enc Vitals Group  ?   BP 03/29/21 0827 (!) 147/82  ?   Pulse Rate 03/29/21 0827 72  ?   Resp 03/29/21 0827 18  ?   Temp 03/29/21 0827 98.3 ?F (36.8 ?C)  ?   Temp Source 03/29/21 0827 Oral  ?   SpO2 03/29/21 0827 98 %  ?   Weight --   ?   Height --   ?   Head Circumference --   ?   Peak Flow --   ?   Pain Score 03/29/21 0826 5  ?   Pain Loc --   ?   Pain Edu? --   ?   Excl. in [place]? --   ?No data found. ? ?Updated Vital Signs ?BP (!) 155/75 (BP Location: Left Arm)   Pulse 68   Temp 98 ?F  (36.7 ?C) (Oral)   Resp 16   SpO2 95%  ? ?Physical Exam ?Vitals and nursing note reviewed.  ?Constitutional:   ?   General: She is not in acute distress. ?   Appearance: Normal appearance. She is not ill-appe

## 2021-10-07 NOTE — Progress Notes (Signed)
?Subjective:  ?Patient ID: Samantha [name], female    DOB: [birth year],  MRN: [id number] ? ?Jerah Esty Amoroso presents to clinic today for at risk foot care. Patient has history of thrombophilia and painful porokeratotic lesion(s) right lower extremity and painful mycotic toenails that limit ambulation. Painful toenails interfere with ambulation. Aggravating factors include wearing enclosed shoe gear. Pain is relieved with periodic professional debridement. Painful porokeratotic lesions are aggravated when weightbearing with and without shoegear. Pain is relieved with periodic professional debridement. ? ?New problem(s):  Right LE redness, itching and swelling. Patient states it started as a bruise on the side of her right LE on Saturday. On yesterday, she noticed redness has developed below her knee. She denies any fever, chills, night sweats, nausea or vomiting. She states she never mounts a fever when she has an infection. ? ? ? ? ? ? ? ? ?PCP is Lajean Manes, MD , and last visit was Oct 01, 2021. ? ?Allergies  ?Allergen Reactions  ? Erythromycin Diarrhea  ?  Other reaction(s): rash  ? Alendronate Other (See Comments)  ? Azithromycin   ?  Other reaction(s): diarrhea  ? Bacitracin-Polymyxin B   ?  Other reaction(s): rash  ? Other   ?  Other reaction(s): nausea ?Other reaction(s): rash ?Other reaction(s): rash  ? Benzalkonium Chloride Rash  ? Erythromycin Rash  ? Neosporin Plus Max St Rash  ? Neosporin [Neomycin-Bacitracin Zn-Polymyx] Rash  ? Penicillins Rash  ?  DID THE REACTION INVOLVE: Swelling of the face/tongue/throat, SOB, or low BP? N ?Sudden or severe rash/hives, skin peeling, or the inside of the mouth or nose? Y ?Did it require medical treatment? N ?When did it last happen?      Childhood ?If all above answers are ?NO?, may proceed with cephalosporin use. ? ?Other reaction(s): rash  ? ? ?Review of Systems: Negative except as noted in the HPI. ? ?Objective:  ?Vitals:  ? 10/07/21 1355  ?Temp: (!) 97.3 ?F (36.3  ?C)  ?  ?Pt is a pleasant 88 y.o. year old Caucasian female WD, WN in NAD. AAO x 3.  ? ?Vascular Examination:  ?Capillary refill time to digits immediate b/l. Palpable pedal pulses b/l LE. Nonpalpable PT pulse(s) b/l lower extremities. Pedal hair present. Lower extremity skin temperature gradient within normal limits. No edema noted b/l lower extremities. Varicosities present b/l. ? ?Dermatological Examination: ?Erythema noted RLE near knee with warmth. Suspected blistering lateral aspect RLE near knee joing. Pedal skin is thin shiny, atrophic b/l lower extremities. No open wounds bilaterally. No interdigital macerations bilaterally. Toenails 1-5 b/l elongated, discolored, dystrophic, thickened, crumbly with subungual debris and tenderness to dorsal palpation. Hyperkeratotic lesion(s) R hallux and submet head 1 right foot.  No erythema, no edema, no drainage, no fluctuance. ? ?Musculoskeletal: ?Normal muscle strength 5/5 to all lower extremity muscle groups bilaterally. No pain crepitus or joint limitation noted with ROM b/l. Hammertoes 2-5 b/l. Tailor's bunion deformity noted b/l lower extremities with tenderness to palpation. ? ?Neurological: ?Protective sensation intact 5/5 intact bilaterally with 10g monofilament b/l. Vibratory sensation intact b/l. ? ?Assessment/Plan: ?1. Pain due to onychomycosis of toenail   ?2. Callus   ?3. Porokeratosis   ?4. Blood clotting disorder ([place])   ?5. Cellulitis and abscess of right lower extremity   ?6. PAD (peripheral artery disease) ([place])   ?  ?-Patient was evaluated and treated. All patient's and/or POA's questions/concerns answered on today's visit. ?-Oral temperature taken and she was afebrile. She is to go to Urgent Care  for assessment of her RLE. ?-Toenails 1-5 b/l were debrided in length and girth with sterile nail nippers and dremel without iatrogenic bleeding.  ?-Callus(es) right great toe pared utilizing sterile scalpel blade without complication or incident. Total  number debrided =1. ?-Porokeratotic lesion(s) submet head 1 right foot pared and enucleated with sterile scalpel blade without incident. Total number of lesions debrided=1. ?-Patient/POA to call should there be question/concern in the interim.  ? ?Return in about 3 months (around 01/07/2022). ? ?Marzetta Board, DPM  ?

## 2021-10-09 ENCOUNTER — Inpatient Hospital Stay (HOSPITAL_COMMUNITY)
Admission: EM | Admit: 2021-10-09 | Discharge: 2021-10-12 | DRG: 603 | Disposition: A | Discharge status: Home / Self Care | Payer: Medicare Other | Attending: Family Medicine | Admitting: Family Medicine

## 2021-10-09 ENCOUNTER — Inpatient Hospital Stay (HOSPITAL_COMMUNITY): Payer: Medicare Other

## 2021-10-09 ENCOUNTER — Encounter (HOSPITAL_COMMUNITY): Payer: Self-pay

## 2021-10-09 DIAGNOSIS — W57XXXA Bitten or stung by nonvenomous insect and other nonvenomous arthropods, initial encounter: Secondary | ICD-10-CM | POA: Diagnosis present

## 2021-10-09 DIAGNOSIS — Z888 Allergy status to other drugs, medicaments and biological substances status: Secondary | ICD-10-CM | POA: Diagnosis not present

## 2021-10-09 DIAGNOSIS — Z79890 Hormone replacement therapy (postmenopausal): Secondary | ICD-10-CM

## 2021-10-09 DIAGNOSIS — E785 Hyperlipidemia, unspecified: Secondary | ICD-10-CM | POA: Diagnosis not present

## 2021-10-09 DIAGNOSIS — Z20822 Contact with and (suspected) exposure to covid-19: Secondary | ICD-10-CM | POA: Diagnosis not present

## 2021-10-09 DIAGNOSIS — D6859 Other primary thrombophilia: Secondary | ICD-10-CM | POA: Diagnosis not present

## 2021-10-09 DIAGNOSIS — Z66 Do not resuscitate: Secondary | ICD-10-CM | POA: Diagnosis present

## 2021-10-09 DIAGNOSIS — I110 Hypertensive heart disease with heart failure: Secondary | ICD-10-CM | POA: Diagnosis present

## 2021-10-09 DIAGNOSIS — Z86718 Personal history of other venous thrombosis and embolism: Secondary | ICD-10-CM | POA: Diagnosis not present

## 2021-10-09 DIAGNOSIS — Z79899 Other long term (current) drug therapy: Secondary | ICD-10-CM | POA: Diagnosis not present

## 2021-10-09 DIAGNOSIS — J45909 Unspecified asthma, uncomplicated: Secondary | ICD-10-CM | POA: Diagnosis not present

## 2021-10-09 DIAGNOSIS — E7800 Pure hypercholesterolemia, unspecified: Secondary | ICD-10-CM | POA: Diagnosis present

## 2021-10-09 DIAGNOSIS — Z8249 Family history of ischemic heart disease and other diseases of the circulatory system: Secondary | ICD-10-CM | POA: Diagnosis not present

## 2021-10-09 DIAGNOSIS — Z881 Allergy status to other antibiotic agents status: Secondary | ICD-10-CM

## 2021-10-09 DIAGNOSIS — R600 Localized edema: Secondary | ICD-10-CM | POA: Diagnosis not present

## 2021-10-09 DIAGNOSIS — E039 Hypothyroidism, unspecified: Secondary | ICD-10-CM | POA: Diagnosis not present

## 2021-10-09 DIAGNOSIS — I5032 Chronic diastolic (congestive) heart failure: Secondary | ICD-10-CM | POA: Diagnosis present

## 2021-10-09 DIAGNOSIS — I480 Paroxysmal atrial fibrillation: Secondary | ICD-10-CM | POA: Diagnosis not present

## 2021-10-09 DIAGNOSIS — Z79818 Long term (current) use of other agents affecting estrogen receptors and estrogen levels: Secondary | ICD-10-CM | POA: Diagnosis not present

## 2021-10-09 DIAGNOSIS — Z880 Allergy status to penicillin: Secondary | ICD-10-CM | POA: Diagnosis not present

## 2021-10-09 DIAGNOSIS — L03115 Cellulitis of right lower limb: Secondary | ICD-10-CM | POA: Diagnosis not present

## 2021-10-09 DIAGNOSIS — Z7901 Long term (current) use of anticoagulants: Secondary | ICD-10-CM | POA: Diagnosis not present

## 2021-10-09 DIAGNOSIS — L0390 Cellulitis, unspecified: Secondary | ICD-10-CM | POA: Diagnosis present

## 2021-10-09 DIAGNOSIS — K219 Gastro-esophageal reflux disease without esophagitis: Secondary | ICD-10-CM | POA: Diagnosis present

## 2021-10-09 DIAGNOSIS — I482 Chronic atrial fibrillation, unspecified: Secondary | ICD-10-CM | POA: Diagnosis present

## 2021-10-09 LAB — CBC WITH DIFFERENTIAL/PLATELET
Abs Immature Granulocytes: 0.07 10*3/uL (ref 0.00–0.07)
Basophils Absolute: 0.1 10*3/uL (ref 0.0–0.1)
Basophils Relative: 1 %
Eosinophils Absolute: 0.3 10*3/uL (ref 0.0–0.5)
Eosinophils Relative: 3 %
HCT: 42.4 % (ref 36.0–46.0)
Hemoglobin: 13.6 g/dL (ref 12.0–15.0)
Immature Granulocytes: 1 %
Lymphocytes Relative: 29 %
Lymphs Abs: 3.2 10*3/uL (ref 0.7–4.0)
MCH: 30.4 pg (ref 26.0–34.0)
MCHC: 32.1 g/dL (ref 30.0–36.0)
MCV: 94.9 fL (ref 80.0–100.0)
Monocytes Absolute: 1.2 10*3/uL — ABNORMAL HIGH (ref 0.1–1.0)
Monocytes Relative: 11 %
Neutro Abs: 6.2 10*3/uL (ref 1.7–7.7)
Neutrophils Relative %: 55 %
Platelets: 232 10*3/uL (ref 150–400)
RBC: 4.47 MIL/uL (ref 3.87–5.11)
RDW: 14.3 % (ref 11.5–15.5)
WBC: 10.9 10*3/uL — ABNORMAL HIGH (ref 4.0–10.5)
nRBC: 0 % (ref 0.0–0.2)

## 2021-10-09 LAB — COMPREHENSIVE METABOLIC PANEL
ALT: 19 U/L (ref 0–44)
AST: 23 U/L (ref 15–41)
Albumin: 3.9 g/dL (ref 3.5–5.0)
Alkaline Phosphatase: 59 U/L (ref 38–126)
Anion gap: 7 (ref 5–15)
BUN: 22 mg/dL (ref 8–23)
CO2: 24 mmol/L (ref 22–32)
Calcium: 9.2 mg/dL (ref 8.9–10.3)
Chloride: 104 mmol/L (ref 98–111)
Creatinine, Ser: 1.08 mg/dL — ABNORMAL HIGH (ref 0.44–1.00)
GFR, Estimated: 49 mL/min — ABNORMAL LOW (ref >60)
Glucose, Bld: 89 mg/dL (ref 70–99)
Potassium: 4.8 mmol/L (ref 3.5–5.1)
Sodium: 135 mmol/L (ref 135–145)
Total Bilirubin: 0.5 mg/dL (ref 0.3–1.2)
Total Protein: 7.3 g/dL (ref 6.5–8.1)

## 2021-10-09 LAB — LACTIC ACID, PLASMA: Lactic Acid, Venous: 1.3 mmol/L (ref 0.5–1.9)

## 2021-10-09 MED ORDER — VANCOMYCIN HCL IN DEXTROSE 1-5 GM/200ML-% IV SOLN
1000.0000 mg | Freq: Once | INTRAVENOUS | Status: AC
  Administered 2021-10-09: 1000 mg via INTRAVENOUS
  Filled 2021-10-09: qty 200

## 2021-10-09 MED ORDER — SODIUM CHLORIDE 0.9 % IV SOLN
2.0000 g | Freq: Once | INTRAVENOUS | Status: AC
  Administered 2021-10-09: 2 g via INTRAVENOUS
  Filled 2021-10-09: qty 12.5

## 2021-10-09 MED ORDER — SODIUM CHLORIDE 0.9 % IV SOLN
100.0000 mg | Freq: Two times a day (BID) | INTRAVENOUS | Status: DC
  Administered 2021-10-10 – 2021-10-11 (×4): 100 mg via INTRAVENOUS
  Filled 2021-10-09 (×7): qty 100

## 2021-10-09 NOTE — ED Provider Notes (Signed)
?[facility] DEPT ?Provider Note ? ? ?CSN: [id number] ?Arrival date & time: 10/09/21  1424 ? ?  ? ?History ? ?Chief Complaint  ?Patient presents with  ? Cellulitis  ? ? ?Samantha [name] is a 88 y.o. female. ? ?Patient complains of rash to right lower leg.  She has a history of atrial fibs.  She was bit by a bug on Saturday and was given Rocephin on Monday with Bactrim.  The rash seems to be getting worse ? ?The history is provided by the patient and medical records. No language interpreter was used.  ?Rash ?Location: Right leg. ?Quality: not blistering   ?Severity:  Moderate ?Onset quality:  Sudden ?Timing:  Constant ?Progression:  Worsening ?Chronicity:  New ?Context: not animal contact   ?Relieved by:  Nothing ?Associated symptoms: no abdominal pain, no diarrhea, no fatigue and no headaches   ? ?  ? ?Home Medications ?Prior to Admission medications   ?Medication Sig Start Date End Date Taking? Authorizing Provider  ?albuterol (VENTOLIN HFA) 108 (90 Base) MCG/ACT inhaler Inhale 1-2 puffs into the lungs every 6 (six) hours as needed for wheezing or shortness of breath. 08/01/20   Collene Gobble, MD  ?apixaban (ELIQUIS) 5 MG TABS tablet Take 5 mg by mouth 2 (two) times daily.    [provider]  ?Calcium-Magnesium-Zinc (CALCIUM-MAGNESUIUM-ZINC PO) Take 1 tablet by mouth as directed. (Monday, Wednesday, Friday)    [provider]  ?Cholecalciferol (VITAMIN D) 2000 units tablet Take 2,000 Units by mouth daily.    [provider]  ?conjugated estrogens (PREMARIN) vaginal cream 1 gram 06/15/19   [provider]  ?denosumab (PROLIA) 60 MG/ML SOSY injection Inject into the skin.    [provider]  ?Ferrous Sulfate (FER-IRON PO) Take 325 mg by mouth 3 (three) times a week.    [provider]  ?furosemide (LASIX) 40 MG tablet Take 1 tablet (40 mg total) by mouth daily. ?Patient taking differently: Take 40 mg by mouth daily. Pt takes in the  afternoon with her potassium tablet 10/24/15   Josue Hector, MD  ?hydrocortisone cream 1 % 1 application    [provider]  ?levothyroxine (SYNTHROID) 125 MCG tablet Take 125 mcg by mouth every morning. 08/17/20   [provider]  ?loratadine (CLARITIN) 10 MG tablet Take 10 mg by mouth daily.    [provider]  ?lovastatin (MEVACOR) 20 MG tablet Take 20 mg by mouth at bedtime.    [provider]  ?metoprolol succinate (TOPROL-XL) 50 MG 24 hr tablet Take 50 mg by mouth at bedtime. Take with or immediately following a meal.    [provider]  ?montelukast (SINGULAIR) 10 MG tablet Take 10 mg by mouth at bedtime.    [provider]  ?Multiple Vitamins-Minerals (PRESERVISION AREDS PO) Take 1 tablet by mouth 2 (two) times daily.    [provider]  ?mupirocin ointment (BACTROBAN) 2 % 3 (three) times daily. 06/06/20   [provider]  ?ofloxacin (FLOXIN) 0.3 % OTIC solution SMARTSIG:5 Drop(s) Left Ear Daily 07/04/21   [provider]  ?Omega-3 Fatty Acids (FISH OIL) 1000 MG CAPS Take 1,000 mg by mouth 3 (three) times daily.    [provider]  ?omeprazole (PRILOSEC) 40 MG capsule Take 1 capsule by mouth daily.    [provider]  ?polyethylene glycol (MIRALAX / GLYCOLAX) packet Take 8.5 g by mouth every other day.    [provider]  ?potassium chloride (K-DUR)  10 MEQ tablet Take 1 tablet (10 mEq total) by mouth daily. 10/24/15   Josue Hector, MD  ?sodium chloride (OCEAN) 0.65 % SOLN nasal spray Place 1 spray into both nostrils 2 (two) times daily.    [provider]  ?sulfamethoxazole-trimethoprim (BACTRIM DS) 800-160 MG tablet Take 1 tablet by mouth 2 (two) times daily for 7 days. 10/07/21 10/14/21  Lynden Oxford Scales, PA-C  ?vitamin B-12 (CYANOCOBALAMIN) 100 MCG tablet Take 100 mcg by mouth as directed. Mon, wed, fri    [provider]  ?   ? ?Allergies    ?Erythromycin, Alendronate,  Azithromycin, Bacitracin-polymyxin b, Other, Benzalkonium chloride, Erythromycin, Neosporin plus max st, Neosporin [neomycin-bacitracin zn-polymyx], and Penicillins   ? ?Review of Systems   ?Review of Systems  ?Constitutional:  Negative for appetite change and fatigue.  ?HENT:  Negative for congestion, ear discharge and sinus pressure.   ?Eyes:  Negative for discharge.  ?Respiratory:  Negative for cough.   ?Cardiovascular:  Negative for chest pain.  ?Gastrointestinal:  Negative for abdominal pain and diarrhea.  ?Genitourinary:  Negative for frequency and hematuria.  ?Musculoskeletal:  Negative for back pain.  ?Skin:  Positive for rash.  ?Neurological:  Negative for seizures and headaches.  ?Psychiatric/Behavioral:  Negative for hallucinations.   ? ?Physical Exam ?Updated Vital Signs ?BP (!) 141/64   Pulse (!) 47   Temp 97.9 ?F (36.6 ?C) (Oral)   Resp 19   SpO2 96%  ?Physical Exam ?Vitals and nursing note reviewed.  ?Constitutional:   ?   Appearance: She is well-developed.  ?HENT:  ?   Head: Normocephalic.  ?   Mouth/Throat:  ?   Mouth: Mucous membranes are moist.  ?Eyes:  ?   General: No scleral icterus. ?   Conjunctiva/sclera: Conjunctivae normal.  ?Neck:  ?   Thyroid: No thyromegaly.  ?Cardiovascular:  ?   Rate and Rhythm: Normal rate and regular rhythm.  ?   Heart sounds: No murmur heard. ?  No friction rub. No gallop.  ?Pulmonary:  ?   Breath sounds: No stridor. No wheezing or rales.  ?Chest:  ?   Chest wall: No tenderness.  ?Abdominal:  ?   General: There is no distension.  ?   Tenderness: There is no abdominal tenderness. There is no rebound.  ?Musculoskeletal:     ?   General: Normal range of motion.  ?   Cervical back: Neck supple.  ?Lymphadenopathy:  ?   Cervical: No cervical adenopathy.  ?Skin: ?   Findings: Rash present. No erythema.  ?   Comments: Rash most likely cellulitis to right lower leg  ?Neurological:  ?   Mental Status: She is alert and oriented to person, place, and time.  ?   Motor: No  abnormal muscle tone.  ?   Coordination: Coordination normal.  ?Psychiatric:     ?   Behavior: Behavior normal.  ? ? ?ED Results / Procedures / Treatments   ?Labs ?(all labs ordered are listed, but only abnormal results are displayed) ?Labs Reviewed  ?CBC WITH DIFFERENTIAL/PLATELET - Abnormal; Notable for the following components:  ?    Result Value  ? WBC 10.9 (*)   ? Monocytes Absolute 1.2 (*)   ? All other components within normal limits  ?COMPREHENSIVE METABOLIC PANEL - Abnormal; Notable for the following components:  ? Creatinine, Ser 1.08 (*)   ? GFR, Estimated 49 (*)   ? All other components within normal limits  ?CULTURE, BLOOD (ROUTINE X 2)  ?  CULTURE, BLOOD (ROUTINE X 2)  ?LACTIC ACID, PLASMA  ?LACTIC ACID, PLASMA  ? ? ?EKG ?None ? ?Radiology ?No results found. ? ?Procedures ?Procedures  ? ? ?Medications Ordered in ED ?Medications  ?vancomycin (VANCOCIN) IVPB 1000 mg/200 mL premix (has no administration in time range)  ?ceFEPIme (MAXIPIME) 2 g in sodium chloride 0.9 % 100 mL IVPB (has no administration in time range)  ? ? ?ED Course/ Medical Decision Making/ A&P ?  ?                        ?Medical Decision Making ?Amount and/or Complexity of Data Reviewed ?ECG/medicine tests: ordered. ? ?Risk ?Prescription drug management. ?Decision regarding hospitalization. ? ?This patient presents to the ED for concern of rash, this involves an extensive number of treatment options, and is a complaint that carries with it a high risk of complications and morbidity.  The differential diagnosis includes colitis, cellulitis ? ? ?Co morbidities that complicate the patient evaluation ? ?A-fib ? ? ?Additional history obtained: ? ?Additional history obtained from patient ?External records from outside source obtained and reviewed including hospital record ? ? ?Lab Tests: ? ?I Ordered, and personally interpreted labs.  The pertinent results include: CBC shows white count elevated 10.9 chemistries unremarkable ? ? ?Imaging  Studies ordered: ? ?I ordered imaging studies including right tib-fib ?I independently visualized and interpreted imaging which showed negative ?I agree with the radiologist interpretation ? ? ?Cardiac Monitoring: / EKG: ? ?The

## 2021-10-09 NOTE — Assessment & Plan Note (Signed)
Continue on eliquis and toprol ?

## 2021-10-09 NOTE — Assessment & Plan Note (Signed)
Continue mevacor °

## 2021-10-09 NOTE — ED Provider Triage Note (Addendum)
Emergency Medicine Provider Triage Evaluation Note ? ?Samantha [name] , a 88 y.o. female  was evaluated in triage.  Pt complains of right leg swelling and redness that began 5 days ago.  Seen at urgent care Monday, prescribed Augmentin and now rash has spread throughout her entire leg.  Patient is also concerned as she is currently on Bactrim, whether this has any interactions with the medication.  Also shows me a "black dot "on her abdomen, reports she has not been up side, this is like a tick bite. ? ?Review of Systems  ?Positive: Rash,wound ?Negative: Fever, nausea,abdominal pain  ? ?Physical Exam  ?BP 136/76 (BP Location: Left Arm)   Pulse (!) 58   Temp 97.9 ?F (36.6 ?C) (Oral)   Resp 16   SpO2 96%  ?Gen:   Awake, no distress   ?Resp:  Normal effort  ?MSK:   Moves extremities without difficulty  ?Other:   ? ?Medical Decision Making  ?Medically screening exam initiated at 3:12 PM.  Appropriate orders placed.  DAINE CROKER was informed that the remainder of the evaluation will be completed by another provider, this initial triage assessment does not replace that evaluation, and the importance of remaining in the ED until their evaluation is complete. ? ?Patient here with right leg cellulitis worsen after starting Augmentin, she is currently on Eliquis as well denies any trauma.  Prior intervention to her right knee.  Also shows me a black dot to her abdomen, appearing of a tick bite. ? ?3:53 PM Tick removed in triage from her abdomen  ? ?Diagnosis: Foreign Body - Location: abdomen ?Procedure: Foreign body removal ?Type of extraction: simple ? ?Informed consent:  Discussed the risks (permanent scarring, light or dark discoloration, infection, pain, bleeding, bruising, redness, blister formation, and recurrence of the lesion) and the benefits of the procedure, as well as the alternatives.  Informed consent was obtained. ?Anesthesia: N/A ?The area was prepared and draped in a standard fashion. ?It was extracted  by forceps retrieval. ?Meticulous hemostasis was obtained with pressure. ? ?Ointment and dressing were applied. ?The specimen was sent for pathologic examination.  ?The patient tolerated the procedure well. ?The patient was instructed on post-op care.   ? ? ? ?  ?Janeece Fitting, PA-C ?10/09/21 1517 ? ?  ?Janeece Fitting, PA-C ?10/09/21 1554 ? ?
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
## 2021-10-09 NOTE — Progress Notes (Signed)
Pharmacy Antibiotic Note ? ?Samantha [name] is a 88 y.o. female admitted on 10/09/2021 with worsening cellulitis.  Pharmacy has been consulted for Vancomycin + Cefepime dosing. ? ?Plan: ?Cefepime 2gm IV q12h  ?Vancomycin '1250mg'$  IV q48h to target AUC 400-550 ?Check Vancomycin levels at steady state ?Monitor renal function and cx data  ? ?  ? ?Temp (24hrs), Avg:97.9 ?F (36.6 ?C), Min:97.9 ?F (36.6 ?C), Max:97.9 ?F (36.6 ?C) ? ?Recent Labs  ?Lab 10/09/21 ?1506  ?WBC 10.9*  ?CREATININE 1.08*  ?LATICACIDVEN 1.3  ?  ?Estimated Creatinine Clearance: 31 mL/min (A) (by C-G formula based on SCr of 1.08 mg/dL (H)).   ? ?Allergies  ?Allergen Reactions  ? Erythromycin Diarrhea and Rash  ? Alendronate Other (See Comments)  ?  Severe stomach cramps- made the patient HORRIBLY SICK  ? Azithromycin Diarrhea  ? Bacitracin-Polymyxin B Rash  ? Benzalkonium Chloride Rash  ? Erythromycin Rash  ? Neosporin Plus Max St Rash  ? Neosporin [Neomycin-Bacitracin Zn-Polymyx] Rash  ? Penicillins Rash  ?  DID THE REACTION INVOLVE: Swelling of the face/tongue/throat, SOB, or low BP? N ?Sudden or severe rash/hives, skin peeling, or the inside of the mouth or nose? Y ?Did it require medical treatment? N ?When did it last happen?      Childhood ?If all above answers are ?NO?, may proceed with cephalosporin use. ? ?Other reaction(s): rash  ? ? ?Antimicrobials this admission: ?5/10 Cefepime >>  ?5/10 Vancomycin >>  ?5/10 Doxycycline >> ? ?Dose adjustments this admission: ? ?Microbiology results: ?5/10 BCx:  ? ?Thank you for allowing pharmacy to be a part of this patient?s care. ? ?Netta Cedars PharmD ?10/09/2021 11:21 PM ? ?

## 2021-10-09 NOTE — Subjective & Objective (Signed)
Was bit on right lower leg by a bug on Saturday went to Urgent care for cellulitis ?Given a dose of rocephin started on bactrim  ?But her swelling continue to get worse ?No fever no chill no nausea no vomiting ?Was told to go to ER if was worse ?

## 2021-10-09 NOTE — ED Triage Notes (Signed)
Pt reports a bug bite to right knee/leg Saturday. Pt was seen at [facility] Monday and diagnosed with cellulitis. Pt was given antibiotics to take for the next 7 days. Pt reports taking antibiotics.  ? ? ? ?Pt was concerned for the redness spreading and also concerned for interactions with bactrim antibiotics and potassium and montelukast.  ? ? ?A/Ox4 ?Ambulatory in triage.  ?

## 2021-10-09 NOTE — Assessment & Plan Note (Signed)
Chronic a fib

## 2021-10-10 ENCOUNTER — Encounter (HOSPITAL_COMMUNITY): Payer: Self-pay | Admitting: Internal Medicine

## 2021-10-10 ENCOUNTER — Other Ambulatory Visit: Payer: Self-pay

## 2021-10-10 DIAGNOSIS — W57XXXA Bitten or stung by nonvenomous insect and other nonvenomous arthropods, initial encounter: Secondary | ICD-10-CM | POA: Diagnosis present

## 2021-10-10 LAB — HEPATIC FUNCTION PANEL
ALT: 19 U/L (ref 0–44)
AST: 23 U/L (ref 15–41)
Albumin: 4.1 g/dL (ref 3.5–5.0)
Alkaline Phosphatase: 64 U/L (ref 38–126)
Bilirubin, Direct: <0.1 mg/dL (ref 0.0–0.2)
Total Bilirubin: 0.5 mg/dL (ref 0.3–1.2)
Total Protein: 7.4 g/dL (ref 6.5–8.1)

## 2021-10-10 LAB — PHOSPHORUS: Phosphorus: 3.6 mg/dL (ref 2.5–4.6)

## 2021-10-10 LAB — COMPREHENSIVE METABOLIC PANEL
ALT: 16 U/L (ref 0–44)
AST: 19 U/L (ref 15–41)
Albumin: 3.5 g/dL (ref 3.5–5.0)
Alkaline Phosphatase: 48 U/L (ref 38–126)
Anion gap: 5 (ref 5–15)
BUN: 20 mg/dL (ref 8–23)
CO2: 24 mmol/L (ref 22–32)
Calcium: 9.2 mg/dL (ref 8.9–10.3)
Chloride: 110 mmol/L (ref 98–111)
Creatinine, Ser: 1.04 mg/dL — ABNORMAL HIGH (ref 0.44–1.00)
GFR, Estimated: 51 mL/min — ABNORMAL LOW (ref >60)
Glucose, Bld: 90 mg/dL (ref 70–99)
Potassium: 4.4 mmol/L (ref 3.5–5.1)
Sodium: 139 mmol/L (ref 135–145)
Total Bilirubin: 0.8 mg/dL (ref 0.3–1.2)
Total Protein: 6.6 g/dL (ref 6.5–8.1)

## 2021-10-10 LAB — CBC
HCT: 39.9 % (ref 36.0–46.0)
Hemoglobin: 12.7 g/dL (ref 12.0–15.0)
MCH: 30.2 pg (ref 26.0–34.0)
MCHC: 31.8 g/dL (ref 30.0–36.0)
MCV: 94.8 fL (ref 80.0–100.0)
Platelets: 221 10*3/uL (ref 150–400)
RBC: 4.21 MIL/uL (ref 3.87–5.11)
RDW: 14.4 % (ref 11.5–15.5)
WBC: 10.4 10*3/uL (ref 4.0–10.5)
nRBC: 0 % (ref 0.0–0.2)

## 2021-10-10 LAB — CK: Total CK: 97 U/L (ref 38–234)

## 2021-10-10 LAB — PREALBUMIN: Prealbumin: 21.3 mg/dL (ref 18–38)

## 2021-10-10 LAB — LACTIC ACID, PLASMA: Lactic Acid, Venous: 1.1 mmol/L (ref 0.5–1.9)

## 2021-10-10 LAB — MAGNESIUM: Magnesium: 2.7 mg/dL — ABNORMAL HIGH (ref 1.7–2.4)

## 2021-10-10 MED ORDER — VANCOMYCIN HCL 1250 MG/250ML IV SOLN: 1250.0000 mg | INTRAVENOUS | Status: DC

## 2021-10-10 MED ORDER — HYDROCODONE-ACETAMINOPHEN 5-325 MG PO TABS: 1.0000 | ORAL_TABLET | ORAL | Status: DC | PRN

## 2021-10-10 MED ORDER — PRAVASTATIN SODIUM 20 MG PO TABS
10.0000 mg | ORAL_TABLET | Freq: Every day | ORAL | Status: DC
  Administered 2021-10-10 – 2021-10-11 (×2): 10 mg via ORAL
  Filled 2021-10-10 (×2): qty 1

## 2021-10-10 MED ORDER — SODIUM CHLORIDE 0.9% FLUSH
3.0000 mL | Freq: Two times a day (BID) | INTRAVENOUS | Status: DC
  Administered 2021-10-10 (×3): 3 mL via INTRAVENOUS

## 2021-10-10 MED ORDER — ACETAMINOPHEN 325 MG PO TABS: 650.0000 mg | ORAL_TABLET | Freq: Four times a day (QID) | ORAL | Status: DC | PRN

## 2021-10-10 MED ORDER — SODIUM CHLORIDE 0.9 % IV SOLN
2.0000 g | Freq: Two times a day (BID) | INTRAVENOUS | Status: DC
  Administered 2021-10-10 – 2021-10-11 (×4): 2 g via INTRAVENOUS
  Filled 2021-10-10 (×5): qty 12.5

## 2021-10-10 MED ORDER — PANTOPRAZOLE SODIUM 40 MG PO TBEC
40.0000 mg | DELAYED_RELEASE_TABLET | Freq: Every day | ORAL | Status: DC
  Administered 2021-10-10 – 2021-10-11 (×2): 40 mg via ORAL
  Filled 2021-10-10 (×2): qty 1

## 2021-10-10 MED ORDER — MONTELUKAST SODIUM 10 MG PO TABS
10.0000 mg | ORAL_TABLET | Freq: Every day | ORAL | Status: DC
Start: 2021-10-10 — End: 2021-10-12
  Administered 2021-10-10 – 2021-10-11 (×2): 10 mg via ORAL
  Filled 2021-10-10 (×2): qty 1

## 2021-10-10 MED ORDER — SODIUM CHLORIDE 0.9 % IV SOLN: 250.0000 mL | INTRAVENOUS | Status: DC | PRN

## 2021-10-10 MED ORDER — APIXABAN 5 MG PO TABS
5.0000 mg | ORAL_TABLET | Freq: Two times a day (BID) | ORAL | Status: DC
  Administered 2021-10-10 – 2021-10-12 (×6): 5 mg via ORAL
  Filled 2021-10-10 (×6): qty 1

## 2021-10-10 MED ORDER — ACETAMINOPHEN 650 MG RE SUPP: 650.0000 mg | Freq: Four times a day (QID) | RECTAL | Status: DC | PRN

## 2021-10-10 MED ORDER — METOPROLOL SUCCINATE ER 50 MG PO TB24
50.0000 mg | ORAL_TABLET | Freq: Every day | ORAL | Status: DC
Start: 2021-10-10 — End: 2021-10-12
  Administered 2021-10-10 – 2021-10-11 (×3): 50 mg via ORAL
  Filled 2021-10-10 (×3): qty 1

## 2021-10-10 MED ORDER — ALBUTEROL SULFATE (2.5 MG/3ML) 0.083% IN NEBU: 2.5000 mg | INHALATION_SOLUTION | RESPIRATORY_TRACT | Status: DC | PRN

## 2021-10-10 MED ORDER — SODIUM CHLORIDE 0.9% FLUSH: 3.0000 mL | INTRAVENOUS | Status: DC | PRN

## 2021-10-10 MED ORDER — ALBUTEROL SULFATE HFA 108 (90 BASE) MCG/ACT IN AERS: 1.0000 | INHALATION_SPRAY | Freq: Four times a day (QID) | RESPIRATORY_TRACT | Status: DC | PRN

## 2021-10-10 MED ORDER — FUROSEMIDE 40 MG PO TABS
40.0000 mg | ORAL_TABLET | Freq: Every day | ORAL | Status: DC
Start: 2021-10-10 — End: 2021-10-12
  Administered 2021-10-10 – 2021-10-11 (×2): 40 mg via ORAL
  Filled 2021-10-10 (×2): qty 1

## 2021-10-10 MED ORDER — LORATADINE 10 MG PO TABS
10.0000 mg | ORAL_TABLET | Freq: Every day | ORAL | Status: DC
  Administered 2021-10-10 – 2021-10-12 (×3): 10 mg via ORAL
  Filled 2021-10-10 (×3): qty 1

## 2021-10-10 MED ORDER — LEVOTHYROXINE SODIUM 125 MCG PO TABS
125.0000 ug | ORAL_TABLET | Freq: Every day | ORAL | Status: DC
  Administered 2021-10-10 – 2021-10-12 (×3): 125 ug via ORAL
  Filled 2021-10-10 (×3): qty 1

## 2021-10-10 NOTE — Plan of Care (Signed)

## 2021-10-10 NOTE — Progress Notes (Signed)
Transition of Care (TOC) Screening Note ? ?Patient Details  ?Name: Samantha [name] ?Date of Birth: [birth year] ? ?Transition of Care (TOC) CM/SW Contact:    ?Sherie Don, LCSW ?Phone Number: ?10/10/2021, 10:48 AM ? ?Transition of Care Department [facility]) has reviewed patient and no TOC needs have been identified at this time. We will continue to monitor patient advancement through interdisciplinary progression rounds. If new patient transition needs arise, please place a TOC consult. ?

## 2021-10-10 NOTE — Assessment & Plan Note (Signed)
-  admit per? cellulitis protocol will  ?  ?     continue current antibiotic choice cefepime Vanco ?     plain films showed:  no evidence of air  no evidence of osteomyelitis   no   foreign   Objects ?Of note patient has history of hardware  ?      Will obtain MRSA screening,  ?      obtain blood cultures ?if febrile or septic ?    further antibiotic adjustment pending above results ?Tomorrow will be able to simplify antibiotic course if able ?

## 2021-10-10 NOTE — Assessment & Plan Note (Signed)
Patient had a tick taken off of her while in emergency department. ?Multiple bite marks which appear like insect bites noted. ?Summary of evidence of mild infection ?1 that produces significant cellulitis with central clearance ?Started on doxycycline in addition to broad-spectrum antibiotics given rapid progression ?Order tickborne illness serologies including Lyme disease RMSF and Ehrlichiosis  ?

## 2021-10-10 NOTE — Assessment & Plan Note (Signed)
Chronic stable continue Prilosec ?
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
## 2021-10-10 NOTE — Progress Notes (Signed)
?PROGRESS NOTE ? ? ? ?Samantha [name]  QQV:[id number] DOB: Sep 15, 1929 DOA: 10/09/2021 ?PCP: Lajean Manes, MD  ? ? ?Brief Narrative:  ?Samantha [name] is a 88 y.o. female with past medical history of atrial fibrillation on Eliquis, hypothyroidism asthma, history of DVT, left bundle branch block presented to the hospital with swelling, redness of the right leg.  Patient had been to urgent care for cellulitis.  In the ED, nurse had noted tick bite in her abdominal area and it was removed.  Labs showed a WBC at 13.9, lactate of 1.3.  Creatinine was 1.08.  Lyme serology, [facility] spotted fever antibody, Ehrlich antibody panel were sent from the ED.  Liver function test within normal lites.  Of note, patient does have a history of same leg leg with hardware insertion.  ? ?Assessment/Plan ? ?Principal Problem: ?  Cellulitis ?Active Problems: ?  G E R D ?  Hyperlipidemia ?  Paroxysmal atrial fibrillation (HCC) ?  Thrombophilia ([place]) ?  Tick bite ? ?Tick bite ?Status post bite mark. Lyme serology, [facility] spotted fever antibody, Ehrlich antibody panel has been sent from the ED. no fever at this time.  Had mild leukocytosis on presentation but ? ?right lower extremity Cellulitis ?Erythema present with mild induration.  On cefepime and Vanco.  X-ray of the leg without any osteomyelitis or foreign body.   Bowel cultures negative less than 12 hours.  We will de-escalate antibiotic at this time to cefepime and continue doxycycline.  Marking of the site has been done we will continue to monitor closely.  Afebrile.  WBC count at 10.4. ? ?  Hyperlipidemia ?Continue statins. ?  ?Paroxysmal atrial fibrillation ([place]) ?Continue Eliquis and beta-blocker.  Rate controlled. ?   ?G E R D ?Continue PPI. ?   ? DVT prophylaxis:  ?apixaban (ELIQUIS) tablet 5 mg  ? ?Code Status:   ?  Code Status: DNR ? ?Disposition: Home likely in 1 to 2 days ? ?Status is: Inpatient ? ?Remains inpatient appropriate because: IV antibiotic, concern for  tick bite ? ? Family Communication:  ?Communicated with the patient at bedside. ? ?Consultants:  ?None ? ?Procedures:  ?None ? ?Antimicrobials:  ?Cefepime and doxycycline ? ?Anti-infectives (From admission, onward)  ? ? Start     Dose/Rate Route Frequency Ordered Stop  ? 10/11/21 1000  vancomycin (VANCOREADY) IVPB 1250 mg/250 mL  Status:  Discontinued       ? 1,250 mg ?166.7 mL/hr over 90 Minutes Intravenous Every 48 hours 10/10/21 0001 10/10/21 1024  ? 10/10/21 1000  ceFEPIme (MAXIPIME) 2 g in sodium chloride 0.9 % 100 mL IVPB       ? 2 g ?200 mL/hr over 30 Minutes Intravenous Every 12 hours 10/10/21 0001    ? 10/09/21 2300  doxycycline (VIBRAMYCIN) 100 mg in sodium chloride 0.9 % 250 mL IVPB       ? 100 mg ?125 mL/hr over 120 Minutes Intravenous Every 12 hours 10/09/21 2242    ? 10/09/21 2030  vancomycin (VANCOCIN) IVPB 1000 mg/200 mL premix       ? 1,000 mg ?200 mL/hr over 60 Minutes Intravenous  Once 10/09/21 2026 10/09/21 2239  ? 10/09/21 2030  ceFEPIme (MAXIPIME) 2 g in sodium chloride 0.9 % 100 mL IVPB       ? 2 g ?200 mL/hr over 30 Minutes Intravenous  Once 10/09/21 2026 10/09/21 2238  ? ?  ? ?Subjective: ? ?Today, patient was seen and examined at bedside.  Complains of  redness and swelling of the right lower extremity.  Denies fever, nausea, vomiting, shortness of breath. ? ?Objective: ?Vitals:  ? 10/09/21 1900 10/09/21 2330 10/10/21 0146 10/10/21 0456  ?BP: (!) 141/64 117/68 133/89 (!) 112/59  ?Pulse: (!) 47 (!) 31 66 63  ?Resp: '19 16 20 19  '$ ?Temp:   97.9 ?F (36.6 ?C) 97.8 ?F (36.6 ?C)  ?TempSrc:      ?SpO2: 96% 94% 99% 97%  ? ? ?Intake/Output Summary (Last 24 hours) at 10/10/2021 1028 ?Last data filed at 10/10/2021 0303 ?Gross per 24 hour  ?Intake 90.81 ml  ?Output --  ?Net 90.81 ml  ? ?There were no vitals filed for this visit. ? ?Physical Examination: ? ?General:  Average built, not in obvious distress, elderly female ?HENT:   No scleral pallor or icterus noted. Oral mucosa is moist.  ?Chest:  Clear  breath sounds.  Diminished breath sounds bilaterally. No crackles or wheezes.  ?CVS: S1 &S2 heard. No murmur.  Regular rate and rhythm. ?Abdomen: Soft, nontender, nondistended.  Bowel sounds are heard.   ?Extremities: No cyanosis, clubbing or edema.  Peripheral pulses are palpable. ?Psych: Alert, awake and oriented, normal mood ?CNS:  No cranial nerve deficits.  Power equal in all extremities.   ?Skin: Erythematous rash on the right lower extremity.  See picture below. ? ? ? ? ? ? ?Data Reviewed:  ? ?CBC: ?Recent Labs  ?Lab 10/09/21 ?1506 10/10/21 ?7209  ?WBC 10.9* 10.4  ?NEUTROABS 6.2  --   ?HGB 13.6 12.7  ?HCT 42.4 39.9  ?MCV 94.9 94.8  ?PLT 232 221  ? ? ?Basic Metabolic Panel: ?Recent Labs  ?Lab 10/09/21 ?1506 10/09/21 ?2228 10/10/21 ?4709  ?NA 135  --  139  ?K 4.8  --  4.4  ?CL 104  --  110  ?CO2 24  --  24  ?GLUCOSE 89  --  90  ?BUN 22  --  20  ?CREATININE 1.08*  --  1.04*  ?CALCIUM 9.2  --  9.2  ?MG  --  2.7*  --   ?PHOS  --  3.6  --   ? ? ?Liver Function Tests: ?Recent Labs  ?Lab 10/09/21 ?1506 10/09/21 ?2228 10/10/21 ?6283  ?AST '23 23 19  '$ ?ALT '19 19 16  '$ ?ALKPHOS 59 64 48  ?BILITOT 0.5 0.5 0.8  ?PROT 7.3 7.4 6.6  ?ALBUMIN 3.9 4.1 3.5  ? ? ? ?Radiology Studies: ?DG Tibia/Fibula Right ? ?Result Date: 10/09/2021 ?CLINICAL DATA:  Cellulitis in leg. EXAM: RIGHT TIBIA AND FIBULA - 2 VIEW COMPARISON:  Radiograph 01/26/2020 FINDINGS: Remote proximal tibia fracture fixated by intramedullary nail and locking screws. No periprosthetic lucency or fracture. Remote proximal fibular fracture that is healed. No acute fracture. No erosion or periosteal thickening. There is mild generalized soft tissue edema. No soft tissue gas or radiopaque foreign body. IMPRESSION: 1. Generalized soft tissue edema. No soft tissue gas or radiopaque foreign body. 2. Remote proximal tibia and fibula fractures, intact tibial hardware. No acute osseous findings or evidence of osteomyelitis. Electronically Signed   By: Keith Rake M.D.   On:  10/09/2021 23:00   ? ? ? LOS: 1 day  ? ? ?Flora Lipps, MD ?[facility] ?Available via Epic secure chat 7am-7pm ?After these hours, please refer to coverage provider listed on [email or web address] ?10/10/2021, 10:28 AM  ? ? ?

## 2021-10-10 NOTE — Progress Notes (Signed)
Patient is transferred to [place] at [place]. Alert and oriented x 4. No pain complained. Walk to bathroom with cane. Vital signs was taken. Will monitor patient as protocol.   ?

## 2021-10-11 LAB — BASIC METABOLIC PANEL
Anion gap: 8 (ref 5–15)
BUN: 22 mg/dL (ref 8–23)
CO2: 23 mmol/L (ref 22–32)
Calcium: 8.4 mg/dL — ABNORMAL LOW (ref 8.9–10.3)
Chloride: 107 mmol/L (ref 98–111)
Creatinine, Ser: 1.19 mg/dL — ABNORMAL HIGH (ref 0.44–1.00)
GFR, Estimated: 44 mL/min — ABNORMAL LOW (ref >60)
Glucose, Bld: 95 mg/dL (ref 70–99)
Potassium: 3.6 mmol/L (ref 3.5–5.1)
Sodium: 138 mmol/L (ref 135–145)

## 2021-10-11 LAB — EHRLICHIA ANTIBODY PANEL
E chaffeensis (HGE) Ab, IgG: NEGATIVE
E chaffeensis (HGE) Ab, IgM: NEGATIVE
E. Chaffeensis (HME) IgM Titer: NEGATIVE
E.Chaffeensis (HME) IgG: NEGATIVE

## 2021-10-11 LAB — CBC
HCT: 39.2 % (ref 36.0–46.0)
Hemoglobin: 12.4 g/dL (ref 12.0–15.0)
MCH: 30.3 pg (ref 26.0–34.0)
MCHC: 31.6 g/dL (ref 30.0–36.0)
MCV: 95.8 fL (ref 80.0–100.0)
Platelets: 221 10*3/uL (ref 150–400)
RBC: 4.09 MIL/uL (ref 3.87–5.11)
RDW: 14.3 % (ref 11.5–15.5)
WBC: 8.2 10*3/uL (ref 4.0–10.5)
nRBC: 0 % (ref 0.0–0.2)

## 2021-10-11 LAB — ROCKY MTN SPOTTED FVR ABS PNL(IGG+IGM)
RMSF IgG: NEGATIVE
RMSF IgM: 0.26 index (ref 0.00–0.89)

## 2021-10-11 LAB — MAGNESIUM: Magnesium: 2.4 mg/dL (ref 1.7–2.4)

## 2021-10-11 LAB — RESP PANEL BY RT-PCR (FLU A&B, COVID) ARPGX2
Influenza A by PCR: NEGATIVE
Influenza B by PCR: NEGATIVE
SARS Coronavirus 2 by RT PCR: NEGATIVE

## 2021-10-11 LAB — LYME DISEASE SEROLOGY W/REFLEX: Lyme Total Antibody EIA: NEGATIVE

## 2021-10-11 MED ORDER — POLYETHYLENE GLYCOL 3350 17 G PO PACK
17.0000 g | PACK | Freq: Every day | ORAL | Status: DC
  Administered 2021-10-11 – 2021-10-12 (×2): 17 g via ORAL
  Filled 2021-10-11 (×2): qty 1
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
## 2021-10-11 NOTE — Plan of Care (Signed)

## 2021-10-12 LAB — BASIC METABOLIC PANEL
Anion gap: 8 (ref 5–15)
BUN: 27 mg/dL — ABNORMAL HIGH (ref 8–23)
CO2: 20 mmol/L — ABNORMAL LOW (ref 22–32)
Calcium: 8.5 mg/dL — ABNORMAL LOW (ref 8.9–10.3)
Chloride: 110 mmol/L (ref 98–111)
Creatinine, Ser: 0.97 mg/dL (ref 0.44–1.00)
GFR, Estimated: 56 mL/min — ABNORMAL LOW (ref >60)
Glucose, Bld: 96 mg/dL (ref 70–99)
Potassium: 3.8 mmol/L (ref 3.5–5.1)
Sodium: 138 mmol/L (ref 135–145)

## 2021-10-12 LAB — CBC
HCT: 39.1 % (ref 36.0–46.0)
Hemoglobin: 12.5 g/dL (ref 12.0–15.0)
MCH: 30.3 pg (ref 26.0–34.0)
MCHC: 32 g/dL (ref 30.0–36.0)
MCV: 94.7 fL (ref 80.0–100.0)
Platelets: 218 10*3/uL (ref 150–400)
RBC: 4.13 MIL/uL (ref 3.87–5.11)
RDW: 14.4 % (ref 11.5–15.5)
WBC: 9.4 10*3/uL (ref 4.0–10.5)
nRBC: 0 % (ref 0.0–0.2)
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
## 2021-10-12 NOTE — Progress Notes (Signed)
Pt being discharged to home. Pt is driving herself home. Discharge instructions and medication education provided to pt.  ?

## 2021-10-12 NOTE — Plan of Care (Signed)

## 2021-10-14 LAB — CULTURE, BLOOD (ROUTINE X 2)
Culture: NO GROWTH
Culture: NO GROWTH
Special Requests: ADEQUATE

## 2021-10-15 ENCOUNTER — Other Ambulatory Visit (HOSPITAL_COMMUNITY): Payer: Medicare Other

## 2021-10-16 DIAGNOSIS — R600 Localized edema: Secondary | ICD-10-CM | POA: Diagnosis not present

## 2021-10-16 DIAGNOSIS — L03115 Cellulitis of right lower limb: Secondary | ICD-10-CM | POA: Diagnosis not present

## 2021-10-31 DIAGNOSIS — H6123 Impacted cerumen, bilateral: Secondary | ICD-10-CM | POA: Diagnosis not present

## 2021-10-31 DIAGNOSIS — H7292 Unspecified perforation of tympanic membrane, left ear: Secondary | ICD-10-CM | POA: Diagnosis not present

## 2021-11-05 ENCOUNTER — Ambulatory Visit (HOSPITAL_COMMUNITY)
Admission: EM | Admit: 2021-11-05 | Discharge: 2021-11-05 | Disposition: A | Discharge status: Home / Self Care | Payer: Medicare Other | Attending: Physician Assistant | Admitting: Physician Assistant

## 2021-11-05 ENCOUNTER — Encounter (HOSPITAL_COMMUNITY): Payer: Self-pay

## 2021-11-05 DIAGNOSIS — M25571 Pain in right ankle and joints of right foot: Secondary | ICD-10-CM | POA: Diagnosis not present

## 2021-11-05 MED ORDER — MUPIROCIN 2 % EX OINT: 1.0000 "application " | TOPICAL_OINTMENT | Freq: Two times a day (BID) | CUTANEOUS | 0 refills | Status: AC

## 2021-11-05 NOTE — ED Triage Notes (Signed)
Cyst on the foot noticed this morning. Was seen for Cellulitis 10/09/21. On the right foot and cellulitis was in the right leg, so Patient is concerned.

## 2021-11-05 NOTE — Discharge Instructions (Addendum)
Advised to do Epsom salt soaks in warm water. Advised to return back to the the next 7 days. Advised to return if symptoms fail to improve

## 2021-11-05 NOTE — ED Provider Notes (Signed)
[place]    CSN: [id number] Arrival date & time: 11/05/21  1413      History   Chief Complaint Chief Complaint  Patient presents with   Cyst    HPI Samantha [name] is a 88 y.o. female.   Patient relates for the past couple days she has noticed a small red area on the dorsum of the right foot.  Patient is concerned because last time she had redness or leg cellulitis that she was hospitalized for patient relates she was vomiting bitten on the right foot, and she did not call injury to the area.  Patient relates she gets a little bit of discomfort from the area, she believes this may be due to her shoes fitting a bit tight, there may be a little bit of rubbing at the site due to the sheets.  She denies fever or chills.    Past Medical History:  Diagnosis Date   Arthritis    Asthma    GERD (gastroesophageal reflux disease)    High cholesterol    Hypothyroidism    LBBB (left bundle branch block)    Ventricular ectopy     Patient Active Problem List   Diagnosis Date Noted   Tick bite 10/10/2021   Cellulitis 10/09/2021   Blood clotting disorder ([place]) 11/20/2020   Age-related osteoporosis without current pathological fracture 07/30/2020   Hardening of the aorta (main artery of the heart) ([place]) 07/30/2020   Heart failure ([place]) 07/30/2020   Intestinal malabsorption 07/30/2020   Mild intermittent asthma 07/30/2020   Pure hypercholesterolemia 07/30/2020   Thrombophilia ([place]) 07/30/2020   Neck pain 04/03/2020   Long term (current) use of anticoagulants 03/05/2020   Unilateral primary osteoarthritis, right knee 01/26/2020   BPPV (benign paroxysmal positional vertigo), right 05/13/2019   Anticoagulated 05/13/2019   Epistaxis 05/13/2019   Carpal tunnel syndrome, right upper limb 04/12/2019   Trigger thumb, left thumb 02/10/2019   Numbness and tingling in right hand 02/10/2019   Pain in left foot 02/10/2019   Closed fracture of right proximal tibia 06/22/2018    Paroxysmal atrial fibrillation ([place]) 05/29/2018   Hypokalemia 05/29/2018   Pain and swelling of right lower extremity 05/28/2018   Closed fracture of shaft of tibia 04/29/2018   Aftercare 04/23/2018   SAH (subarachnoid hemorrhage) ([place]) 04/02/2018   Chronic bilateral low back pain with bilateral sciatica 03/08/2018   Abnormal CT scan, chest 08/08/2016   Hearing loss 09/10/2015   Impacted cerumen of right ear 09/10/2015   Otorrhea of left ear 09/10/2015   Tympanic membrane perforation, left 09/10/2015   Near syncope 01/06/2015   Left leg weakness 01/06/2015   Hyperlipidemia    Hypothyroidism    Ventricular ectopy    Chest pain 05/22/2014   Left bundle branch block 05/22/2014   PVC's (premature ventricular contractions) 05/22/2014   HOARSENESS 02/14/2010   CHEST PAIN, ATYPICAL 02/14/2010   Allergic rhinitis 02/13/2010   G E R D 02/13/2010    Past Surgical History:  Procedure Laterality Date   CARDIOVERSION N/A 11/27/2015   Procedure: CARDIOVERSION;  Surgeon: Josue Hector, MD;  Location: [facility] ENDOSCOPY;  Service: Cardiovascular;  Laterality: N/A;   KNEE ARTHROSCOPY     TIBIA IM NAIL INSERTION Right 04/05/2018   Procedure: INTRAMEDULLARY (IM) NAIL TIBIAL;  Surgeon: Nicholes Stairs, MD;  Location: [facility];  Service: Orthopedics;  Laterality: Right;    OB History   No obstetric history on file.      Home Medications  Prior to Admission medications   Medication Sig Start Date End Date Taking? Authorizing Provider  albuterol (VENTOLIN HFA) 108 (90 Base) MCG/ACT inhaler Inhale 1-2 puffs into the lungs every 6 (six) hours as needed for wheezing or shortness of breath. 08/01/20  Yes Collene Gobble, MD  apixaban (ELIQUIS) 5 MG TABS tablet Take 5 mg by mouth in the morning and at bedtime.   Yes [provider]  Calcium-Magnesium-Zinc (CALCIUM-MAGNESUIUM-ZINC PO) Take 1 tablet by mouth every Monday, Wednesday, and Friday.   Yes [provider]  Cholecalciferol  (VITAMIN D3) 50 MCG (2000 UT) TABS Take 2,000 Units by mouth in the morning.   Yes [provider]  denosumab (PROLIA) 60 MG/ML SOSY injection Inject 60 mg into the skin every 6 (six) months.   Yes [provider]  furosemide (LASIX) 40 MG tablet Take 1 tablet (40 mg total) by mouth daily. Patient taking differently: Take 40 mg by mouth daily in the afternoon. 10/24/15  Yes Josue Hector, MD  hydrocortisone cream 1 % Apply 1 application. topically daily as needed for itching (affected areas).   Yes [provider]  levothyroxine (SYNTHROID) 125 MCG tablet Take 125 mcg by mouth daily before breakfast. 08/17/20  Yes [provider]  loratadine (CLARITIN) 10 MG tablet Take 10 mg by mouth in the morning.   Yes [provider]  lovastatin (MEVACOR) 20 MG tablet Take 20 mg by mouth at bedtime.   Yes [provider]  Lubricants (LUBRICATING JELLY) GEL Place 1 application. vaginally in the morning and at bedtime.   Yes [provider]  metoprolol succinate (TOPROL-XL) 50 MG 24 hr tablet Take 50 mg by mouth at bedtime. Take with or immediately following a meal.   Yes [provider]  montelukast (SINGULAIR) 10 MG tablet Take 10 mg by mouth at bedtime.   Yes [provider]  Multiple Vitamins-Minerals (PRESERVISION AREDS PO) Take 1 capsule by mouth 2 (two) times daily.   Yes [provider]  Omega-3 Fatty Acids (FISH OIL) 1000 MG CAPS Take 1,000 mg by mouth 3 (three) times daily.   Yes [provider]  omeprazole (PRILOSEC) 40 MG capsule Take 40 mg by mouth at bedtime.   Yes [provider]  polyethylene glycol (MIRALAX / GLYCOLAX) packet Take 8.5 g by mouth every other day.   Yes [provider]  potassium chloride (K-DUR) 10 MEQ tablet Take 1 tablet (10 mEq total) by mouth daily. Patient taking differently: Take 10 mEq by mouth daily in the afternoon. 10/24/15  Yes Josue Hector, MD  sodium  chloride (OCEAN) 0.65 % SOLN nasal spray Place 1 spray into both nostrils as needed for congestion.   Yes [provider]  vitamin B-12 (CYANOCOBALAMIN) 100 MCG tablet Take 100 mcg by mouth every Monday, Wednesday, and Friday.   Yes [provider]  mupirocin ointment (BACTROBAN) 2 % Apply 1 application. topically 2 (two) times daily. Apply to area on foot. 11/05/21   Nyoka Lint, PA-C    Family History Family History  Problem Relation Age of Onset   Heart attack Mother    Throat cancer Father    Congestive Heart Failure Sister    Congestive Heart Failure Brother     Social History Social History   Tobacco Use   Smoking status: Never    Passive exposure: Yes   Smokeless tobacco: Never  Vaping Use   Vaping Use: Never used  Substance Use Topics   Alcohol  use: No    Alcohol/week: 0.0 standard drinks   Drug use: No     Allergies   Erythromycin, Alendronate, Azithromycin, Bacitracin-polymyxin b, Benzalkonium chloride, Erythromycin, Neo-bacit-poly-lidocaine, Neosporin [neomycin-bacitracin zn-polymyx], and Penicillins   Review of Systems Review of Systems  Skin:  Positive for wound (right foot redness).    Physical Exam Triage Vital Signs ED Triage Vitals  Enc Vitals Group     BP 11/05/21 1558 (!) 160/76     Pulse Rate 11/05/21 1558 68     Resp 11/05/21 1558 16     Temp 11/05/21 1558 97.6 F (36.4 C)     Temp Source 11/05/21 1558 Oral     SpO2 11/05/21 1558 95 %     Weight 11/05/21 1601 146 lb (66.2 kg)     Height 11/05/21 1601 '5\' 1"'$  (1.549 m)     Head Circumference --      Peak Flow --      Pain Score 11/05/21 1600 3     Pain Loc --      Pain Edu? --      Excl. in [place]? --    No data found.  Updated Vital Signs BP (!) 160/76 (BP Location: Right Arm)   Pulse 68   Temp 97.6 F (36.4 C) (Oral)   Resp 16   Ht '5\' 1"'$  (1.549 m)   Wt 146 lb (66.2 kg)   SpO2 95%   BMI 27.59 kg/m   Visual Acuity Right Eye Distance:   Left Eye Distance:    Bilateral Distance:    Right Eye Near:   Left Eye Near:    Bilateral Near:     Physical Exam Constitutional:      Appearance: Normal appearance.  Musculoskeletal:       Feet:  Feet:     Comments: Right foot: Mid dorsum Fourth MTP area is a faint amount of redness 0.25 cm there is, no tenderness on palpation, there is no streaking.  Full range of motion is normal. Neurological:     Mental Status: She is alert.     UC Treatments / Results  Labs (all labs ordered are listed, but only abnormal results are displayed) Labs Reviewed - No data to display  EKG   Radiology No results found.  Procedures Procedures (including critical care time)  Medications Ordered in UC Medications - No data to display  Initial Impression / Assessment and Plan / UC Course  I have reviewed the triage vital signs and the nursing notes.  Pertinent labs & imaging results that were available during my care of the patient were reviewed by me and considered in my medical decision making (see chart for details).    Plan: 1.  Advised Epsom salt soaks 2-3 times during the day to help treat soreness of the foot. 2.  Advised to apply the Bactroban ointment lightly 2-3 times a day. 3.  Advised to follow-up with PCP or return if symptoms fail to improve. Final Clinical Impressions(s) / UC Diagnoses   Final diagnoses:  Pain in joint involving right ankle and foot     Discharge Instructions      Advised to do Epsom salt soaks in warm water. Advised to return back to the the next 7 days. Advised to return if symptoms fail to improve    ED Prescriptions     Medication Sig Dispense Auth. Provider   mupirocin ointment (BACTROBAN) 2 % Apply 1 application. topically 2 (two) times daily. Apply to area on  foot. 15 g Nyoka Lint, PA-C      PDMP not reviewed this encounter.   Nyoka Lint, PA-C 11/05/21 1623

## 2021-11-08 DIAGNOSIS — S90821D Blister (nonthermal), right foot, subsequent encounter: Secondary | ICD-10-CM | POA: Diagnosis not present

## 2021-11-13 DIAGNOSIS — L03115 Cellulitis of right lower limb: Secondary | ICD-10-CM | POA: Diagnosis not present

## 2021-11-18 DIAGNOSIS — L03115 Cellulitis of right lower limb: Secondary | ICD-10-CM | POA: Diagnosis not present

## 2021-11-20 ENCOUNTER — Ambulatory Visit (HOSPITAL_COMMUNITY): Payer: Medicare Other | Attending: Cardiology

## 2021-11-20 DIAGNOSIS — I358 Other nonrheumatic aortic valve disorders: Secondary | ICD-10-CM

## 2021-11-20 DIAGNOSIS — I5032 Chronic diastolic (congestive) heart failure: Secondary | ICD-10-CM

## 2021-11-20 DIAGNOSIS — I480 Paroxysmal atrial fibrillation: Secondary | ICD-10-CM

## 2021-11-20 DIAGNOSIS — I447 Left bundle-branch block, unspecified: Secondary | ICD-10-CM

## 2021-11-20 DIAGNOSIS — R000 Tachycardia, unspecified: Secondary | ICD-10-CM | POA: Diagnosis not present

## 2021-11-20 DIAGNOSIS — I340 Nonrheumatic mitral (valve) insufficiency: Secondary | ICD-10-CM

## 2021-11-21 LAB — ECHOCARDIOGRAM COMPLETE
Area-P 1/2: 4.36 cm2
MV M vel: 5.25 m/s
MV Peak grad: 110.3 mmHg
Radius: 0.6 cm
S' Lateral: 2.6 cm
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
## 2021-11-25 ENCOUNTER — Ambulatory Visit: Payer: Medicare Other | Admitting: Nurse Practitioner

## 2021-11-25 ENCOUNTER — Encounter: Payer: Self-pay | Admitting: Nurse Practitioner

## 2021-11-25 VITALS — BP 100/58 | HR 93 | Ht 61.0 in | Wt 144.0 lb

## 2021-11-25 DIAGNOSIS — E785 Hyperlipidemia, unspecified: Secondary | ICD-10-CM | POA: Diagnosis not present

## 2021-11-25 DIAGNOSIS — R000 Tachycardia, unspecified: Secondary | ICD-10-CM

## 2021-11-25 DIAGNOSIS — I447 Left bundle-branch block, unspecified: Secondary | ICD-10-CM | POA: Diagnosis not present

## 2021-11-25 DIAGNOSIS — I480 Paroxysmal atrial fibrillation: Secondary | ICD-10-CM | POA: Diagnosis not present

## 2021-11-25 DIAGNOSIS — I358 Other nonrheumatic aortic valve disorders: Secondary | ICD-10-CM | POA: Diagnosis not present

## 2021-11-25 DIAGNOSIS — Z7901 Long term (current) use of anticoagulants: Secondary | ICD-10-CM

## 2021-11-25 DIAGNOSIS — I340 Nonrheumatic mitral (valve) insufficiency: Secondary | ICD-10-CM

## 2021-11-25 MED ORDER — FUROSEMIDE 40 MG PO TABS: 20.0000 mg | ORAL_TABLET | Freq: Every day | ORAL | 3 refills | Status: DC

## 2021-11-26 ENCOUNTER — Encounter: Payer: Self-pay | Admitting: Podiatry

## 2021-11-29 DIAGNOSIS — E7800 Pure hypercholesterolemia, unspecified: Secondary | ICD-10-CM | POA: Diagnosis not present

## 2021-11-29 DIAGNOSIS — J4530 Mild persistent asthma, uncomplicated: Secondary | ICD-10-CM | POA: Diagnosis not present

## 2021-11-29 DIAGNOSIS — E039 Hypothyroidism, unspecified: Secondary | ICD-10-CM | POA: Diagnosis not present

## 2021-11-29 DIAGNOSIS — I480 Paroxysmal atrial fibrillation: Secondary | ICD-10-CM | POA: Diagnosis not present

## 2021-11-29 DIAGNOSIS — K219 Gastro-esophageal reflux disease without esophagitis: Secondary | ICD-10-CM | POA: Diagnosis not present

## 2021-11-29 DIAGNOSIS — I509 Heart failure, unspecified: Secondary | ICD-10-CM | POA: Diagnosis not present

## 2021-12-12 DIAGNOSIS — E039 Hypothyroidism, unspecified: Secondary | ICD-10-CM | POA: Diagnosis not present

## 2021-12-12 DIAGNOSIS — I480 Paroxysmal atrial fibrillation: Secondary | ICD-10-CM | POA: Diagnosis not present

## 2021-12-12 DIAGNOSIS — K219 Gastro-esophageal reflux disease without esophagitis: Secondary | ICD-10-CM | POA: Diagnosis not present

## 2021-12-12 DIAGNOSIS — I509 Heart failure, unspecified: Secondary | ICD-10-CM | POA: Diagnosis not present

## 2021-12-12 DIAGNOSIS — J4530 Mild persistent asthma, uncomplicated: Secondary | ICD-10-CM | POA: Diagnosis not present

## 2021-12-12 DIAGNOSIS — E7800 Pure hypercholesterolemia, unspecified: Secondary | ICD-10-CM | POA: Diagnosis not present

## 2021-12-26 DIAGNOSIS — L309 Dermatitis, unspecified: Secondary | ICD-10-CM | POA: Diagnosis not present

## 2021-12-26 DIAGNOSIS — L0889 Other specified local infections of the skin and subcutaneous tissue: Secondary | ICD-10-CM | POA: Diagnosis not present

## 2022-01-02 DIAGNOSIS — R42 Dizziness and giddiness: Secondary | ICD-10-CM | POA: Diagnosis not present

## 2022-01-02 DIAGNOSIS — R55 Syncope and collapse: Secondary | ICD-10-CM | POA: Diagnosis not present

## 2022-01-03 ENCOUNTER — Telehealth: Payer: Self-pay | Admitting: Cardiovascular Disease

## 2022-01-03 NOTE — Progress Notes (Signed)
Office Visit    Patient Name: Samantha [name] Date of Encounter: 01/07/2022  Primary Care Provider:  Charlane Ferretti, MD Primary Cardiologist:  Jenkins Rouge, MD Primary Electrophysiologist: None  Chief Complaint    Samantha [name] is a 88 y.o. female with PMH of hypothyroidism, HLD, LBBB, DVT, paroxysmal atrial fibrillation on Eliquis, HFpEF who presents today for evaluation of palpitations.  Past Medical History    Past Medical History:  Diagnosis Date   Arthritis    Asthma    GERD (gastroesophageal reflux disease)    High cholesterol    Hypothyroidism    LBBB (left bundle branch block)    Ventricular ectopy    Past Surgical History:  Procedure Laterality Date   CARDIOVERSION N/A 11/27/2015   Procedure: CARDIOVERSION;  Surgeon: Josue Hector, MD;  Location: [facility];  Service: Cardiovascular;  Laterality: N/A;   KNEE ARTHROSCOPY     TIBIA IM NAIL INSERTION Right 04/05/2018   Procedure: INTRAMEDULLARY (IM) NAIL TIBIAL;  Surgeon: Nicholes Stairs, MD;  Location: [facility];  Service: Orthopedics;  Laterality: Right;    Allergies  Allergies  Allergen Reactions   Erythromycin Diarrhea and Rash   Alendronate Other (See Comments)    Severe stomach cramps- made the patient HORRIBLY SICK   Azithromycin Diarrhea   Bacitracin-Polymyxin B Rash   Benzalkonium Chloride Rash   Erythromycin Rash   Neo-Bacit-Poly-Lidocaine Rash   Neosporin [Neomycin-Bacitracin Zn-Polymyx] Rash   Penicillins Rash    DID THE REACTION INVOLVE: Swelling of the face/tongue/throat, SOB, or low BP? N Sudden or severe rash/hives, skin peeling, or the inside of the mouth or nose? Y Did it require medical treatment? N When did it last happen?      Childhood If all above answers are "NO", may proceed with cephalosporin use.  Other reaction(s): rash    History of Present Illness    Samantha [name] is a 88 year old female with the above-mentioned past medical history who presents today for complaint  of palpitations.  She was admitted in December 2015 and found to have new LBBB with chest pain 2D echo was performed that revealed abnormal septal motion inferobasal hypokinesis; otherwise normal LV systolic function with EF 50-55%.  She was discharged and presented the following year with syncope with palpitations and loss of consciousness she was started on beta-blocker for management of PVCs.  In 2017 she was diagnosed with atrial fibrillation and placed on Eliquis.  She had 4 attempts of DCCV however would not maintain sinus rhythm and she was started on amiodarone with eventual conversion.  Her TSH was elevated and amiodarone had to be discontinued in 2018.  She was last seen by Lorenda Peck 11/25/2021 for hospital follow-up.  She was being treated for lower extremity cellulitis but was doing well from cardiac perspective.  She had no changes to her medications and was scheduled for follow-up in 6 months.  She contacted the office on 8/3 with complaint of irregular heartbeat   Since last being seen in the office patient reports that she is experienced an episode of sweating pounding headache with weakness that occurs when away from her home.  This occurred recently at [place] and she was evaluated by the [facility] nurse that showed blood pressure and heart rate were normal.  She was offered water and crackers and felt much better and was able to drive home.  In reviewing her latest lab work she had no abnormal findings.  She denies any  associated chest pain or palpitations when these events occur.  I encouraged her to follow-up with her PCP regarding possible referral for anxiety and panic. Patient denies chest pain, palpitations, dyspnea, PND, orthopnea, nausea, vomiting, dizziness, syncope, edema, weight gain, or early satiety.  Home Medications    Current Outpatient Medications  Medication Sig Dispense Refill   albuterol (VENTOLIN HFA) 108 (90 Base) MCG/ACT inhaler Inhale 1-2 puffs  into the lungs every 6 (six) hours as needed for wheezing or shortness of breath. 18 g 0   apixaban (ELIQUIS) 5 MG TABS tablet Take 5 mg by mouth in the morning and at bedtime.     Calcium-Magnesium-Zinc (CALCIUM-MAGNESUIUM-ZINC PO) Take 1 tablet by mouth every Monday, Wednesday, and Friday.     Cholecalciferol (VITAMIN D3) 50 MCG (2000 UT) TABS Take 2,000 Units by mouth in the morning.     denosumab (PROLIA) 60 MG/ML SOSY injection Inject 60 mg into the skin every 6 (six) months.     furosemide (LASIX) 40 MG tablet Take 0.5 tablets (20 mg total) by mouth daily. 90 tablet 3   hydrocortisone cream 1 % Apply 1 application. topically daily as needed for itching (affected areas).     levothyroxine (SYNTHROID) 125 MCG tablet Take 125 mcg by mouth daily before breakfast.     loratadine (CLARITIN) 10 MG tablet Take 10 mg by mouth in the morning.     lovastatin (MEVACOR) 20 MG tablet Take 20 mg by mouth at bedtime.     Lubricants (LUBRICATING JELLY) GEL Place 1 application. vaginally in the morning and at bedtime.     metoprolol succinate (TOPROL-XL) 50 MG 24 hr tablet Take 50 mg by mouth at bedtime. Take with or immediately following a meal.     montelukast (SINGULAIR) 10 MG tablet Take 10 mg by mouth at bedtime.     Multiple Vitamins-Minerals (PRESERVISION AREDS PO) Take 1 capsule by mouth 2 (two) times daily.     mupirocin ointment (BACTROBAN) 2 % Apply 1 application. topically 2 (two) times daily. Apply to area on foot. 15 g 0   Omega-3 Fatty Acids (FISH OIL) 1000 MG CAPS Take 1,000 mg by mouth 3 (three) times daily.     omeprazole (PRILOSEC) 40 MG capsule Take 40 mg by mouth at bedtime.     polyethylene glycol (MIRALAX / GLYCOLAX) packet Take 8.5 g by mouth every other day.     potassium chloride (K-DUR) 10 MEQ tablet Take 1 tablet (10 mEq total) by mouth daily. 90 tablet 3   sodium chloride (OCEAN) 0.65 % SOLN nasal spray Place 1 spray into both nostrils as needed for congestion.     vitamin B-12  (CYANOCOBALAMIN) 100 MCG tablet Take 100 mcg by mouth every Monday, Wednesday, and Friday.     No current facility-administered medications for this visit.     Review of Systems  Please see the history of present illness.    (+) Weakness (+) Pounding heart rate and headache  All other systems reviewed and are otherwise negative except as noted above.  Physical Exam    Wt Readings from Last 3 Encounters:  01/07/22 143 lb (64.9 kg)  11/25/21 144 lb (65.3 kg)  11/05/21 146 lb (66.2 kg)   VS: Vitals:   01/07/22 1332  BP: 132/76  Pulse: 91  SpO2: 96%  ,Body mass index is 27.02 kg/m.  Constitutional:      Appearance: Healthy appearance. Not in distress.  Neck:     Vascular: JVD normal.  Pulmonary:  Effort: Pulmonary effort is normal.     Breath sounds: No wheezing. No rales. Diminished in the bases Cardiovascular:     Normal rate. Regular rhythm. Normal S1. Normal S2.      Murmurs: There is no murmur.  Edema:    Peripheral edema absent.  Abdominal:     Palpations: Abdomen is soft non tender. There is no hepatomegaly.  Skin:    General: Skin is warm and dry.  Neurological:     General: No focal deficit present.     Mental Status: Alert and oriented to person, place and time.     Cranial Nerves: Cranial nerves are intact.  EKG/LABS/Other Studies Reviewed    ECG personally reviewed by me today -sinus rhythm with PACs and left axis deviation with no acute changes  Risk Assessment/Calculations:    CHA2DS2-VASc Score = 4   This indicates a 4.8% annual risk of stroke. The patient's score is based upon: CHF History: 0 HTN History: 1 Diabetes History: 0 Stroke History: 0 Vascular Disease History: 0 Age Score: 2 Gender Score: 1           Lab Results  Component Value Date   WBC 9.4 10/12/2021   HGB 12.5 10/12/2021   HCT 39.1 10/12/2021   MCV 94.7 10/12/2021   PLT 218 10/12/2021   Lab Results  Component Value Date   CREATININE 0.97 10/12/2021   BUN 27  (H) 10/12/2021   NA 138 10/12/2021   K 3.8 10/12/2021   CL 110 10/12/2021   CO2 20 (L) 10/12/2021   Lab Results  Component Value Date   ALT 16 10/10/2021   AST 19 10/10/2021   ALKPHOS 48 10/10/2021   BILITOT 0.8 10/10/2021   Lab Results  Component Value Date   CHOL 173 05/23/2014   HDL 49 05/23/2014   LDLCALC 104 (H) 05/23/2014   TRIG 98 05/23/2014   CHOLHDL 3.5 05/23/2014    Lab Results  Component Value Date   HGBA1C 6.3 (H) 05/22/2014    Assessment & Plan    1.  Sinus tach/palpitations: -She denies any additional palpitations and heart rate today was 91 bpm. I encouraged her to continue her metoprolol 50 mg daily. -Her EKG today was sinus rhythm with PACs that was unchanged from her previous 3 EKGs -Based on her presenting symptoms I advised her to follow-up with her PCP to discuss adding medication to help with her anxiety   2.  Chronic HFpEF: -Patient last echo was completed in 2023 with no segment changes from previous noting normal EF and mild mitral regurgitation -She is euvolemic on examination and is compliant with her Lasix daily. -BP today was well controlled at 132/76  3.  Hyperlipidemia: -Patient currently on lovastatin 20 mg -Last LDL was 80 which is above goal of less than 70  4.  Paroxysmal atrial fibrillation: -Patient is maintaining sinus rhythm per EKG today and encouraged to continue rate control as noted above. -She has no bleeding concerns with her NOAC -Continue Eliquis 5 mg twice daily  Disposition: Follow-up with Jenkins Rouge, MD or APP as scheduled   Medication Adjustments/Labs and Tests Ordered: Current medicines are reviewed at length with the patient today.  Concerns regarding medicines are outlined above.   Signed, Mable Fill, Marissa Nestle, NP 01/07/2022, 2:36 PM [place]

## 2022-01-03 NOTE — Telephone Encounter (Signed)
Patient states that she been having episodes and her EKG showed irregular hb. Please advise    Schd the patient for 8/8 @ 1:30

## 2022-01-06 NOTE — Telephone Encounter (Signed)
Agree with having her come in for evaluation.

## 2022-01-07 ENCOUNTER — Encounter: Payer: Self-pay | Admitting: Nurse Practitioner

## 2022-01-07 ENCOUNTER — Ambulatory Visit: Payer: Medicare Other | Admitting: Nurse Practitioner

## 2022-01-07 VITALS — BP 132/76 | HR 91 | Ht 61.0 in | Wt 143.0 lb

## 2022-01-07 DIAGNOSIS — I5032 Chronic diastolic (congestive) heart failure: Secondary | ICD-10-CM

## 2022-01-07 DIAGNOSIS — E785 Hyperlipidemia, unspecified: Secondary | ICD-10-CM

## 2022-01-07 DIAGNOSIS — I480 Paroxysmal atrial fibrillation: Secondary | ICD-10-CM | POA: Diagnosis not present

## 2022-01-07 DIAGNOSIS — R000 Tachycardia, unspecified: Secondary | ICD-10-CM | POA: Diagnosis not present

## 2022-01-07 NOTE — Patient Instructions (Signed)
Medication Instructions:  Your physician recommends that you continue on your current medications as directed. Please refer to the Current Medication list given to you today.  *If you need a refill on your cardiac medications before your next appointment, please call your pharmacy*   Lab Work: None ordered If you have labs (blood work) drawn today and your tests are completely normal, you will receive your results only by: [place] (if you have MyChart) OR A paper copy in the mail If you have any lab test that is abnormal or we need to change your treatment, we will call you to review the results.   Testing/Procedures: None ordered   Follow-Up: At [facility], you and your health needs are our priority.  As part of our continuing mission to provide you with exceptional heart care, we have created designated Provider Care Teams.  These Care Teams include your primary Cardiologist (physician) and Advanced Practice Providers (APPs -  Physician Assistants and Nurse Practitioners) who all work together to provide you with the care you need, when you need it.  We recommend signing up for the patient portal called "MyChart".  Sign up information is provided on this After Visit Summary.  MyChart is used to connect with patients for Virtual Visits (Telemedicine).  Patients are able to view lab/test results, encounter notes, upcoming appointments, etc.  Non-urgent messages can be sent to your provider as well.   To learn more about what you can do with MyChart, go to [email or web address].    Your next appointment:   7 month(s)  The format for your next appointment:   In Person  Provider:   Jenkins Rouge, MD    Other Instructions Follow up with your primary care dr  Important Information About Sugar

## 2022-01-10 ENCOUNTER — Ambulatory Visit: Payer: Medicare Other | Admitting: Podiatry

## 2022-01-13 DIAGNOSIS — J4530 Mild persistent asthma, uncomplicated: Secondary | ICD-10-CM | POA: Diagnosis not present

## 2022-01-13 DIAGNOSIS — E039 Hypothyroidism, unspecified: Secondary | ICD-10-CM | POA: Diagnosis not present

## 2022-01-13 DIAGNOSIS — I480 Paroxysmal atrial fibrillation: Secondary | ICD-10-CM | POA: Diagnosis not present

## 2022-01-13 DIAGNOSIS — E7800 Pure hypercholesterolemia, unspecified: Secondary | ICD-10-CM | POA: Diagnosis not present

## 2022-01-13 DIAGNOSIS — K219 Gastro-esophageal reflux disease without esophagitis: Secondary | ICD-10-CM | POA: Diagnosis not present

## 2022-01-14 ENCOUNTER — Ambulatory Visit: Payer: Medicare Other | Admitting: Nurse Practitioner

## 2022-01-15 ENCOUNTER — Ambulatory Visit: Payer: Medicare Other | Admitting: Podiatry

## 2022-01-15 ENCOUNTER — Encounter: Payer: Self-pay | Admitting: Podiatry

## 2022-01-15 DIAGNOSIS — D689 Coagulation defect, unspecified: Secondary | ICD-10-CM | POA: Diagnosis not present

## 2022-01-15 DIAGNOSIS — L84 Corns and callosities: Secondary | ICD-10-CM

## 2022-01-15 DIAGNOSIS — M79674 Pain in right toe(s): Secondary | ICD-10-CM

## 2022-01-15 DIAGNOSIS — I739 Peripheral vascular disease, unspecified: Secondary | ICD-10-CM | POA: Diagnosis not present

## 2022-01-15 DIAGNOSIS — M79675 Pain in left toe(s): Secondary | ICD-10-CM

## 2022-01-15 DIAGNOSIS — B351 Tinea unguium: Secondary | ICD-10-CM

## 2022-01-15 DIAGNOSIS — F419 Anxiety disorder, unspecified: Secondary | ICD-10-CM | POA: Insufficient documentation

## 2022-01-21 NOTE — Progress Notes (Signed)
  Subjective:  Patient ID: GLENDOLA FRIEDHOFF, female    DOB: [birth year],  MRN: [id number]  STEPHANA MORELL presents to clinic today for for at risk foot care. Patient has h/o PAD and painful porokeratotic lesion(s) right lower extremity and painful mycotic toenails that limit ambulation. Painful toenails interfere with ambulation. Aggravating factors include wearing enclosed shoe gear. Pain is relieved with periodic professional debridement. Painful porokeratotic lesions are aggravated when weightbearing with and without shoegear. Pain is relieved with periodic professional debridement.  Patient thanked me for sending her to Urgent Care on last visit. A nurse also found a tick on her stomach at the Urgent Care visit. She did end up being hospitalized for cellulitis and it has resolved.  New problem(s): None.   PCP is Charlane Ferretti, MD , and last visit was January 09, 2022.  Allergies  Allergen Reactions   Erythromycin Diarrhea and Rash   Alendronate Other (See Comments)    Severe stomach cramps- made the patient HORRIBLY SICK   Alendronate Sodium     Other reaction(s): nausea   Azithromycin Diarrhea   Bacitracin-Polymyxin B Rash   Benzalkonium Chloride Rash   Erythromycin Rash   Neo-Bacit-Poly-Lidocaine Rash   Neosporin [Neomycin-Bacitracin Zn-Polymyx] Rash   Penicillins Rash    DID THE REACTION INVOLVE: Swelling of the face/tongue/throat, SOB, or low BP? N Sudden or severe rash/hives, skin peeling, or the inside of the mouth or nose? Y Did it require medical treatment? N When did it last happen?      Childhood If all above answers are "NO", may proceed with cephalosporin use.  Other reaction(s): rash Other reaction(s): rash    Review of Systems: Negative except as noted in the HPI. Objective:   Constitutional KINLEY DOZIER is a pleasant 88 y.o. Caucasian female, WD, WN in NAD. AAO x 3.   Vascular Capillary refill time to digits immediate b/l. Palpable DP pulse(s) b/l LE. Diminished  PT pulse(s) b/l LE. Pedal hair sparse. No pain with calf compression b/l. Varicosities present b/l. No cyanosis or clubbing noted b/l LE.  Neurologic Normal speech. Oriented to person, place, and time. Protective sensation intact 5/5 intact bilaterally with 10g monofilament b/l. Vibratory sensation intact b/l.  Dermatologic Pedal integument with normal turgor, texture and tone BLE. No open wounds b/l LE. No interdigital macerations noted b/l LE. Toenails 1-5 b/l elongated, discolored, dystrophic, thickened, crumbly with subungual debris and tenderness to dorsal palpation. Hyperkeratotic lesion(s) medial IPJ of right great toe.  No erythema, no edema, no drainage, no fluctuance. Resolved porokeratosis submet head 1 right foot.  Orthopedic: Muscle strength 5/5 to all lower extremity muscle groups bilaterally. Tailor's bunion deformity noted b/l LE. Hammertoe deformity noted 2-5 b/l. Utilizes cane for ambulation assistance.   Radiographs: None   Assessment:   1. Pain due to onychomycosis of toenail   2. Callus   3. Blood clotting disorder ([place])   4. PAD (peripheral artery disease) ([place])    Plan:  Patient was evaluated and treated and all questions answered. Consent given for treatment as described below: -Examined patient. -Porokeratosis submet head 1 right foot resolved. -Mycotic toenails 1-5 bilaterally were debrided in length and girth with sterile nail nippers and dremel without incident. -Callus(es) medial IPJ of right great toe pared utilizing sterile scalpel blade without complication or incident. Total number debrided =1. -Patient/POA to call should there be question/concern in the interim.  Return in about 3 months (around 04/17/2022).  Marzetta Board, DPM

## 2022-01-31 DIAGNOSIS — H6123 Impacted cerumen, bilateral: Secondary | ICD-10-CM | POA: Diagnosis not present

## 2022-02-10 DIAGNOSIS — I480 Paroxysmal atrial fibrillation: Secondary | ICD-10-CM | POA: Diagnosis not present

## 2022-02-10 DIAGNOSIS — J4530 Mild persistent asthma, uncomplicated: Secondary | ICD-10-CM | POA: Diagnosis not present

## 2022-02-10 DIAGNOSIS — E7800 Pure hypercholesterolemia, unspecified: Secondary | ICD-10-CM | POA: Diagnosis not present

## 2022-02-10 DIAGNOSIS — K219 Gastro-esophageal reflux disease without esophagitis: Secondary | ICD-10-CM | POA: Diagnosis not present

## 2022-02-27 DIAGNOSIS — I509 Heart failure, unspecified: Secondary | ICD-10-CM | POA: Diagnosis not present

## 2022-02-27 DIAGNOSIS — E559 Vitamin D deficiency, unspecified: Secondary | ICD-10-CM | POA: Diagnosis not present

## 2022-02-27 DIAGNOSIS — Z0000 Encounter for general adult medical examination without abnormal findings: Secondary | ICD-10-CM | POA: Diagnosis not present

## 2022-02-27 DIAGNOSIS — E7800 Pure hypercholesterolemia, unspecified: Secondary | ICD-10-CM | POA: Diagnosis not present

## 2022-02-27 DIAGNOSIS — Z23 Encounter for immunization: Secondary | ICD-10-CM | POA: Diagnosis not present

## 2022-02-27 DIAGNOSIS — I480 Paroxysmal atrial fibrillation: Secondary | ICD-10-CM | POA: Diagnosis not present

## 2022-02-27 DIAGNOSIS — E039 Hypothyroidism, unspecified: Secondary | ICD-10-CM | POA: Diagnosis not present

## 2022-02-27 DIAGNOSIS — Z79899 Other long term (current) drug therapy: Secondary | ICD-10-CM | POA: Diagnosis not present

## 2022-02-27 DIAGNOSIS — J4530 Mild persistent asthma, uncomplicated: Secondary | ICD-10-CM | POA: Diagnosis not present

## 2022-02-27 DIAGNOSIS — K219 Gastro-esophageal reflux disease without esophagitis: Secondary | ICD-10-CM | POA: Diagnosis not present

## 2022-02-27 DIAGNOSIS — D6869 Other thrombophilia: Secondary | ICD-10-CM | POA: Diagnosis not present

## 2022-02-27 DIAGNOSIS — I700 Atherosclerosis of aorta: Secondary | ICD-10-CM | POA: Diagnosis not present

## 2022-03-21 DIAGNOSIS — M810 Age-related osteoporosis without current pathological fracture: Secondary | ICD-10-CM | POA: Diagnosis not present

## 2022-03-29 DIAGNOSIS — R519 Headache, unspecified: Secondary | ICD-10-CM | POA: Diagnosis not present

## 2022-03-29 DIAGNOSIS — Z743 Need for continuous supervision: Secondary | ICD-10-CM | POA: Diagnosis not present

## 2022-03-29 DIAGNOSIS — R404 Transient alteration of awareness: Secondary | ICD-10-CM | POA: Diagnosis not present

## 2022-03-29 DIAGNOSIS — R0689 Other abnormalities of breathing: Secondary | ICD-10-CM | POA: Diagnosis not present

## 2022-03-29 DIAGNOSIS — W19XXXA Unspecified fall, initial encounter: Secondary | ICD-10-CM | POA: Diagnosis not present

## 2022-03-30 ENCOUNTER — Emergency Department (HOSPITAL_COMMUNITY): Payer: Medicare Other

## 2022-03-30 ENCOUNTER — Emergency Department (HOSPITAL_COMMUNITY)
Admission: EM | Admit: 2022-03-30 | Discharge: 2022-03-30 | Disposition: A | Discharge status: Home / Self Care | Payer: Medicare Other | Attending: Student | Admitting: Student

## 2022-03-30 DIAGNOSIS — Y92838 Other recreation area as the place of occurrence of the external cause: Secondary | ICD-10-CM | POA: Insufficient documentation

## 2022-03-30 DIAGNOSIS — I509 Heart failure, unspecified: Secondary | ICD-10-CM | POA: Insufficient documentation

## 2022-03-30 DIAGNOSIS — M79604 Pain in right leg: Secondary | ICD-10-CM | POA: Insufficient documentation

## 2022-03-30 DIAGNOSIS — Z7951 Long term (current) use of inhaled steroids: Secondary | ICD-10-CM | POA: Insufficient documentation

## 2022-03-30 DIAGNOSIS — W01198A Fall on same level from slipping, tripping and stumbling with subsequent striking against other object, initial encounter: Secondary | ICD-10-CM | POA: Insufficient documentation

## 2022-03-30 DIAGNOSIS — Z043 Encounter for examination and observation following other accident: Secondary | ICD-10-CM | POA: Diagnosis not present

## 2022-03-30 DIAGNOSIS — S199XXA Unspecified injury of neck, initial encounter: Secondary | ICD-10-CM | POA: Diagnosis not present

## 2022-03-30 DIAGNOSIS — Z7722 Contact with and (suspected) exposure to environmental tobacco smoke (acute) (chronic): Secondary | ICD-10-CM | POA: Diagnosis not present

## 2022-03-30 DIAGNOSIS — M542 Cervicalgia: Secondary | ICD-10-CM | POA: Diagnosis not present

## 2022-03-30 DIAGNOSIS — M533 Sacrococcygeal disorders, not elsewhere classified: Secondary | ICD-10-CM | POA: Diagnosis not present

## 2022-03-30 DIAGNOSIS — Z7901 Long term (current) use of anticoagulants: Secondary | ICD-10-CM | POA: Diagnosis not present

## 2022-03-30 DIAGNOSIS — Z79899 Other long term (current) drug therapy: Secondary | ICD-10-CM | POA: Diagnosis not present

## 2022-03-30 DIAGNOSIS — M545 Low back pain, unspecified: Secondary | ICD-10-CM | POA: Diagnosis not present

## 2022-03-30 DIAGNOSIS — E039 Hypothyroidism, unspecified: Secondary | ICD-10-CM | POA: Insufficient documentation

## 2022-03-30 DIAGNOSIS — S0003XA Contusion of scalp, initial encounter: Secondary | ICD-10-CM | POA: Insufficient documentation

## 2022-03-30 DIAGNOSIS — D72829 Elevated white blood cell count, unspecified: Secondary | ICD-10-CM | POA: Insufficient documentation

## 2022-03-30 DIAGNOSIS — J45909 Unspecified asthma, uncomplicated: Secondary | ICD-10-CM | POA: Diagnosis not present

## 2022-03-30 DIAGNOSIS — S0083XA Contusion of other part of head, initial encounter: Secondary | ICD-10-CM | POA: Diagnosis not present

## 2022-03-30 DIAGNOSIS — W19XXXA Unspecified fall, initial encounter: Secondary | ICD-10-CM

## 2022-03-30 DIAGNOSIS — R519 Headache, unspecified: Secondary | ICD-10-CM | POA: Diagnosis not present

## 2022-03-30 DIAGNOSIS — M25561 Pain in right knee: Secondary | ICD-10-CM | POA: Diagnosis not present

## 2022-03-30 DIAGNOSIS — M1611 Unilateral primary osteoarthritis, right hip: Secondary | ICD-10-CM | POA: Diagnosis not present

## 2022-03-30 LAB — COMPREHENSIVE METABOLIC PANEL
ALT: 16 U/L (ref 0–44)
AST: 23 U/L (ref 15–41)
Albumin: 3.3 g/dL — ABNORMAL LOW (ref 3.5–5.0)
Alkaline Phosphatase: 62 U/L (ref 38–126)
Anion gap: 10 (ref 5–15)
BUN: 21 mg/dL (ref 8–23)
CO2: 23 mmol/L (ref 22–32)
Calcium: 9.1 mg/dL (ref 8.9–10.3)
Chloride: 107 mmol/L (ref 98–111)
Creatinine, Ser: 0.94 mg/dL (ref 0.44–1.00)
GFR, Estimated: 58 mL/min — ABNORMAL LOW (ref >60)
Glucose, Bld: 128 mg/dL — ABNORMAL HIGH (ref 70–99)
Potassium: 4 mmol/L (ref 3.5–5.1)
Sodium: 140 mmol/L (ref 135–145)
Total Bilirubin: 0.2 mg/dL — ABNORMAL LOW (ref 0.3–1.2)
Total Protein: 6.4 g/dL — ABNORMAL LOW (ref 6.5–8.1)

## 2022-03-30 LAB — CBC WITH DIFFERENTIAL/PLATELET
Abs Immature Granulocytes: 0.07 10*3/uL (ref 0.00–0.07)
Basophils Absolute: 0.1 10*3/uL (ref 0.0–0.1)
Basophils Relative: 1 %
Eosinophils Absolute: 0.1 10*3/uL (ref 0.0–0.5)
Eosinophils Relative: 1 %
HCT: 41.4 % (ref 36.0–46.0)
Hemoglobin: 12.8 g/dL (ref 12.0–15.0)
Immature Granulocytes: 1 %
Lymphocytes Relative: 27 %
Lymphs Abs: 3.2 10*3/uL (ref 0.7–4.0)
MCH: 29.8 pg (ref 26.0–34.0)
MCHC: 30.9 g/dL (ref 30.0–36.0)
MCV: 96.5 fL (ref 80.0–100.0)
Monocytes Absolute: 1.5 10*3/uL — ABNORMAL HIGH (ref 0.1–1.0)
Monocytes Relative: 12 %
Neutro Abs: 7.1 10*3/uL (ref 1.7–7.7)
Neutrophils Relative %: 58 %
Platelets: 246 10*3/uL (ref 150–400)
RBC: 4.29 MIL/uL (ref 3.87–5.11)
RDW: 14 % (ref 11.5–15.5)
WBC: 12 10*3/uL — ABNORMAL HIGH (ref 4.0–10.5)
nRBC: 0 % (ref 0.0–0.2)

## 2022-03-30 MED ORDER — METOPROLOL SUCCINATE ER 25 MG PO TB24
50.0000 mg | ORAL_TABLET | Freq: Every day | ORAL | Status: DC
  Administered 2022-03-30: 50 mg via ORAL
  Filled 2022-03-30: qty 2

## 2022-03-30 MED ORDER — METOPROLOL SUCCINATE ER 25 MG PO TB24: 50.0000 mg | ORAL_TABLET | Freq: Every day | ORAL | Status: DC

## 2022-03-30 MED ORDER — FENTANYL CITRATE PF 50 MCG/ML IJ SOSY
50.0000 ug | PREFILLED_SYRINGE | Freq: Once | INTRAMUSCULAR | Status: AC
  Administered 2022-03-30: 50 ug via INTRAVENOUS
  Filled 2022-03-30: qty 1

## 2022-03-30 NOTE — ED Notes (Signed)
Patient talking with staff, A&Ox4 no confusion noted. C/0 8/10 pain to left forehead. No s/s of fany distress. Will continue to monitor

## 2022-03-30 NOTE — ED Provider Notes (Signed)
[facility] Provider Note  CSN: [id number] Arrival date & time: 03/30/22 0014  Chief Complaint(s) Fall (Patient BIB EMS from due to fall from standing position. Hit head on left side of forehead, LOC per family. Patient c/o to tail bone. Patient on Elquis  VSS. CBG 131. C-collar in place)  HPI Samantha [name] is a 88 y.o. female with PMH asthma, GERD, current Eliquis use who presents emergency department for evaluation of a fall.  Patient states that she was at hollowing party, bent down to pick up a toy off the ground and woke up on the ground.  She arrives with head wounds and hematoma to the left forehead.  Also endorsing right leg pain and back pain.  Denies chest pain, shortness of breath, fever or other systemic symptoms.  Denies numbness, tingling, weakness or other neurologic or traumatic complaints.   Past Medical History Past Medical History:  Diagnosis Date   Arthritis    Asthma    GERD (gastroesophageal reflux disease)    High cholesterol    Hypothyroidism    LBBB (left bundle branch block)    Ventricular ectopy    Patient Active Problem List   Diagnosis Date Noted   Anxiety 01/15/2022   Tick bite 10/10/2021   Cellulitis 10/09/2021   Blood clotting disorder ([place]) 11/20/2020   Age-related osteoporosis without current pathological fracture 07/30/2020   Hardening of the aorta (main artery of the heart) ([place]) 07/30/2020   Heart failure ([place]) 07/30/2020   Intestinal malabsorption 07/30/2020   Mild intermittent asthma 07/30/2020   Pure hypercholesterolemia 07/30/2020   Thrombophilia ([place]) 07/30/2020   Neck pain 04/03/2020   Long term (current) use of anticoagulants 03/05/2020   Unilateral primary osteoarthritis, right knee 01/26/2020   BPPV (benign paroxysmal positional vertigo), right 05/13/2019   Anticoagulated 05/13/2019   Epistaxis 05/13/2019   Carpal tunnel syndrome, right upper limb 04/12/2019   Trigger thumb, left thumb  02/10/2019   Numbness and tingling in right hand 02/10/2019   Pain in left foot 02/10/2019   Closed fracture of right proximal tibia 06/22/2018   Paroxysmal atrial fibrillation ([place]) 05/29/2018   Hypokalemia 05/29/2018   Pain and swelling of right lower extremity 05/28/2018   Closed fracture of shaft of tibia 04/29/2018   Aftercare 04/23/2018   SAH (subarachnoid hemorrhage) ([place]) 04/02/2018   Chronic bilateral low back pain with bilateral sciatica 03/08/2018   Abnormal CT scan, chest 08/08/2016   Hearing loss 09/10/2015   Impacted cerumen of right ear 09/10/2015   Otorrhea of left ear 09/10/2015   Tympanic membrane perforation, left 09/10/2015   Near syncope 01/06/2015   Left leg weakness 01/06/2015   Hyperlipidemia    Hypothyroidism    Ventricular ectopy    Chest pain 05/22/2014   Left bundle branch block 05/22/2014   PVC's (premature ventricular contractions) 05/22/2014   HOARSENESS 02/14/2010   CHEST PAIN, ATYPICAL 02/14/2010   Allergic rhinitis 02/13/2010   G E R D 02/13/2010   Home Medication(s) Prior to Admission medications   Medication Sig Start Date End Date Taking? Authorizing Provider  albuterol (VENTOLIN HFA) 108 (90 Base) MCG/ACT inhaler Inhale 1-2 puffs into the lungs every 6 (six) hours as needed for wheezing or shortness of breath. 08/01/20   Collene Gobble, MD  apixaban (ELIQUIS) 5 MG TABS tablet Take 5 mg by mouth in the morning and at bedtime.    [provider]  Calcium-Magnesium-Zinc (CALCIUM-MAGNESUIUM-ZINC PO) Take 1 tablet by mouth every Monday, Wednesday,  and Friday.    [provider]  Cholecalciferol (VITAMIN D3) 50 MCG (2000 UT) TABS Take 2,000 Units by mouth in the morning.    [provider]  denosumab (PROLIA) 60 MG/ML SOSY injection Inject 60 mg into the skin every 6 (six) months.    [provider]  FLUoxetine (PROZAC) 10 MG capsule Take 10 mg by mouth daily. 01/09/22   [provider]  furosemide  (LASIX) 40 MG tablet Take 0.5 tablets (20 mg total) by mouth daily. 11/25/21   Swinyer, Lanice Schwab, NP  hydrocortisone cream 1 % Apply 1 application. topically daily as needed for itching (affected areas).    [provider]  levothyroxine (SYNTHROID) 125 MCG tablet Take 125 mcg by mouth daily before breakfast. 08/17/20   [provider]  loratadine (CLARITIN) 10 MG tablet Take 10 mg by mouth in the morning.    [provider]  lovastatin (MEVACOR) 20 MG tablet Take 20 mg by mouth at bedtime.    [provider]  Lubricants (LUBRICATING JELLY) GEL Place 1 application. vaginally in the morning and at bedtime.    [provider]  metoprolol succinate (TOPROL-XL) 50 MG 24 hr tablet Take 50 mg by mouth at bedtime. Take with or immediately following a meal.    [provider]  montelukast (SINGULAIR) 10 MG tablet Take 10 mg by mouth at bedtime.    [provider]  Multiple Vitamins-Minerals (PRESERVISION AREDS PO) Take 1 capsule by mouth 2 (two) times daily.    [provider]  mupirocin ointment (BACTROBAN) 2 % Apply 1 application. topically 2 (two) times daily. Apply to area on foot. 11/05/21   Nyoka Lint, PA-C  Omega-3 Fatty Acids (FISH OIL) 1000 MG CAPS Take 1,000 mg by mouth 3 (three) times daily.    [provider]  omeprazole (PRILOSEC) 40 MG capsule Take 40 mg by mouth at bedtime.    [provider]  polyethylene glycol (MIRALAX / GLYCOLAX) packet Take 8.5 g by mouth every other day.    [provider]  potassium chloride (K-DUR) 10 MEQ tablet Take 1 tablet (10 mEq total) by mouth daily. 10/24/15   Josue Hector, MD  sodium chloride (OCEAN) 0.65 % SOLN nasal spray Place 1 spray into both nostrils as needed for congestion.    [provider]  vitamin B-12 (CYANOCOBALAMIN) 100 MCG tablet Take 100 mcg by mouth every Monday, Wednesday, and Friday.    [provider]                                                                                                                                     Past Surgical History Past Surgical History:  Procedure Laterality Date   CARDIOVERSION N/A 11/27/2015   Procedure: CARDIOVERSION;  Surgeon: Josue Hector, MD;  Location: [facility] ENDOSCOPY;  Service: Cardiovascular;  Laterality: N/A;   KNEE ARTHROSCOPY  TIBIA IM NAIL INSERTION Right 04/05/2018   Procedure: INTRAMEDULLARY (IM) NAIL TIBIAL;  Surgeon: Nicholes Stairs, MD;  Location: [facility];  Service: Orthopedics;  Laterality: Right;   Family History Family History  Problem Relation Age of Onset   Heart attack Mother    Throat cancer Father    Congestive Heart Failure Sister    Congestive Heart Failure Brother     Social History Social History   Tobacco Use   Smoking status: Never    Passive exposure: Yes   Smokeless tobacco: Never  Vaping Use   Vaping Use: Never used  Substance Use Topics   Alcohol use: No    Alcohol/week: 0.0 standard drinks of alcohol   Drug use: No   Allergies Erythromycin, Alendronate, Alendronate sodium, Azithromycin, Bacitracin-polymyxin b, Benzalkonium chloride, Erythromycin, Neo-bacit-poly-lidocaine, Neosporin [neomycin-bacitracin zn-polymyx], and Penicillins  Review of Systems Review of Systems  Musculoskeletal:  Positive for arthralgias and myalgias.  Neurological:  Positive for headaches.    Physical Exam Vital Signs  I have reviewed the triage vital signs BP 131/82   Pulse (!) 102   Temp 98.3 F (36.8 C) (Oral)   Resp 19   SpO2 95%   Physical Exam Vitals and nursing note reviewed.  Constitutional:      General: She is not in acute distress.    Appearance: She is well-developed.  HENT:     Head: Normocephalic.     Comments: Left frontal hematoma Eyes:     Conjunctiva/sclera: Conjunctivae normal.  Cardiovascular:     Rate and Rhythm: Normal rate and regular rhythm.     Heart sounds: No murmur heard. Pulmonary:      Effort: Pulmonary effort is normal. No respiratory distress.     Breath sounds: Normal breath sounds.  Abdominal:     Palpations: Abdomen is soft.     Tenderness: There is no abdominal tenderness.  Musculoskeletal:        General: No swelling.     Cervical back: Neck supple.  Skin:    General: Skin is warm and dry.     Capillary Refill: Capillary refill takes less than 2 seconds.  Neurological:     Mental Status: She is alert.  Psychiatric:        Mood and Affect: Mood normal.     ED Results and Treatments Labs (all labs ordered are listed, but only abnormal results are displayed) Labs Reviewed  COMPREHENSIVE METABOLIC PANEL - Abnormal; Notable for the following components:      Result Value   Glucose, Bld 128 (*)    Total Protein 6.4 (*)    Albumin 3.3 (*)    Total Bilirubin 0.2 (*)    GFR, Estimated 58 (*)    All other components within normal limits  CBC WITH DIFFERENTIAL/PLATELET - Abnormal; Notable for the following components:   WBC 12.0 (*)    Monocytes Absolute 1.5 (*)    All other components within normal limits  Radiology CT HEAD WO CONTRAST (5MM)  Result Date: 03/30/2022 CLINICAL DATA:  Head and neck trauma, pain to left forehead. EXAM: CT HEAD WITHOUT CONTRAST CT CERVICAL SPINE WITHOUT CONTRAST TECHNIQUE: Multidetector CT imaging of the head and cervical spine was performed following the standard protocol without intravenous contrast. Multiplanar CT image reconstructions of the cervical spine were also generated. RADIATION DOSE REDUCTION: This exam was performed according to the departmental dose-optimization program which includes automated exposure control, adjustment of the mA and/or kV according to patient size and/or use of iterative reconstruction technique. COMPARISON:  01/06/2015. FINDINGS: CT HEAD FINDINGS Brain: No acute intracranial  hemorrhage, midline shift or mass effect. No extra-axial fluid collection. Mild periventricular white matter hypodensities are present bilaterally. No hydrocephalus. Vascular: No hyperdense vessel or unexpected calcification. Skull: No acute fracture. Sinuses/Orbits: No acute finding. Other: Small scalp hematoma containing air over the frontal bone on the left CT CERVICAL SPINE FINDINGS Alignment: Normal. Skull base and vertebrae: No acute fracture. No primary bone lesion or focal pathologic process. Soft tissues and spinal canal: No prevertebral fluid or swelling. No visible canal hematoma. Disc levels: Degenerative disc disease, osteophyte formation is present at C2-C3 at C5-C6. Mild facet arthropathy Upper chest: No acute abnormality. Other: Aberrant origin of the right subclavian artery is noted. IMPRESSION: 1. No acute intracranial hemorrhage. 2. Chronic microvascular ischemic changes. 3. Small scalp hematoma over the frontal bone on the left. 4. Mild degenerative changes in the cervical spine without evidence of acute fracture. Electronically Signed   By: Brett Fairy M.D.   On: 03/30/2022 02:50   CT Cervical Spine Wo Contrast  Result Date: 03/30/2022 CLINICAL DATA:  Head and neck trauma, pain to left forehead. EXAM: CT HEAD WITHOUT CONTRAST CT CERVICAL SPINE WITHOUT CONTRAST TECHNIQUE: Multidetector CT imaging of the head and cervical spine was performed following the standard protocol without intravenous contrast. Multiplanar CT image reconstructions of the cervical spine were also generated. RADIATION DOSE REDUCTION: This exam was performed according to the departmental dose-optimization program which includes automated exposure control, adjustment of the mA and/or kV according to patient size and/or use of iterative reconstruction technique. COMPARISON:  01/06/2015. FINDINGS: CT HEAD FINDINGS Brain: No acute intracranial hemorrhage, midline shift or mass effect. No extra-axial fluid collection. Mild  periventricular white matter hypodensities are present bilaterally. No hydrocephalus. Vascular: No hyperdense vessel or unexpected calcification. Skull: No acute fracture. Sinuses/Orbits: No acute finding. Other: Small scalp hematoma containing air over the frontal bone on the left CT CERVICAL SPINE FINDINGS Alignment: Normal. Skull base and vertebrae: No acute fracture. No primary bone lesion or focal pathologic process. Soft tissues and spinal canal: No prevertebral fluid or swelling. No visible canal hematoma. Disc levels: Degenerative disc disease, osteophyte formation is present at C2-C3 at C5-C6. Mild facet arthropathy Upper chest: No acute abnormality. Other: Aberrant origin of the right subclavian artery is noted. IMPRESSION: 1. No acute intracranial hemorrhage. 2. Chronic microvascular ischemic changes. 3. Small scalp hematoma over the frontal bone on the left. 4. Mild degenerative changes in the cervical spine without evidence of acute fracture. Electronically Signed   By: Brett Fairy M.D.   On: 03/30/2022 02:50   CT Lumbar Spine Wo Contrast  Result Date: 03/30/2022 CLINICAL DATA:  Low back pain EXAM: CT LUMBAR SPINE WITHOUT CONTRAST TECHNIQUE: Multidetector CT imaging of the lumbar spine was performed without intravenous contrast administration. Multiplanar CT image reconstructions were also generated. RADIATION DOSE REDUCTION: This exam was performed according to the departmental dose-optimization program which includes  automated exposure control, adjustment of the mA and/or kV according to patient size and/or use of iterative reconstruction technique. COMPARISON:  No prior dedicated CT of the lumbar spine, correlation is made with 04/02/2018 CT abdomen and 03/17/2018 MRI lumbar spine FINDINGS: Segmentation: 5 lumbar type vertebrae. Alignment: No listhesis. Vertebrae: No acute fracture or suspicious osseous lesion. Paraspinal and other soft tissues: Aortic atherosclerosis. Disc levels: T12-L1: No  significant disc bulge. No spinal canal stenosis or neural foraminal narrowing. L1-L2: No significant disc bulge. No spinal canal stenosis or neural foraminal narrowing. L2-L3: No significant disc bulge. No spinal canal stenosis or neural foraminal narrowing. L3-L4: No significant disc bulge. No spinal canal stenosis or neural foraminal narrowing. L4-L5: Mild disc bulge. Mild facet arthropathy. No spinal canal stenosis or neural foraminal narrowing. L5-S1: Mild disc bulge. Moderate facet arthropathy. No spinal canal stenosis or neural foraminal narrowing. IMPRESSION: 1. No acute osseous abnormality. 2. Aortic atherosclerosis. Aortic Atherosclerosis (ICD10-I70.0). Electronically Signed   By: Merilyn Baba M.D.   On: 03/30/2022 02:23   DG Knee Right Port  Result Date: 03/30/2022 CLINICAL DATA:  Fall, pain EXAM: PORTABLE RIGHT KNEE - 1-2 VIEW COMPARISON:  None Available. FINDINGS: Remote posttraumatic and postsurgical changes seen in the proximal right tibia and fibula. Mild degenerative changes within the right knee with joint space narrowing and spurring. No joint effusion. No acute bony abnormality. Specifically, no fracture, subluxation, or dislocation. IMPRESSION: No acute bony abnormality. Electronically Signed   By: Rolm Baptise M.D.   On: 03/30/2022 02:12   DG Femur Min 2 Views Right  Result Date: 03/30/2022 CLINICAL DATA:  Fall EXAM: RIGHT FEMUR 2 VIEWS COMPARISON:  None Available. FINDINGS: No acute bony abnormality. Specifically, no fracture, subluxation, or dislocation. Mild degenerative changes in the right hip and right knee. IMPRESSION: No acute bony abnormality. Electronically Signed   By: Rolm Baptise M.D.   On: 03/30/2022 02:12    Pertinent labs & imaging results that were available during my care of the patient were reviewed by me and considered in my medical decision making (see MDM for details).  Medications Ordered in ED Medications  metoprolol succinate (TOPROL-XL) 24 hr tablet 50  mg (50 mg Oral Given 03/30/22 0326)  fentaNYL (SUBLIMAZE) injection 50 mcg (50 mcg Intravenous Given 03/30/22 0110)                                                                                                                                     Procedures .Critical Care  Performed by: Teressa Lower, MD Authorized by: Teressa Lower, MD   Critical care provider statement:    Critical care time (minutes):  30   Critical care was necessary to treat or prevent imminent or life-threatening deterioration of the following conditions:  Trauma   Critical care was time spent personally by me on the following activities:  Development of treatment plan with patient or surrogate, discussions with consultants, evaluation  of patient's response to treatment, examination of patient, ordering and review of laboratory studies, ordering and review of radiographic studies, ordering and performing treatments and interventions, pulse oximetry, re-evaluation of patient's condition and review of old charts   (including critical care time)  Medical Decision Making / ED Course   This patient presents to the ED for concern of fall, this involves an extensive number of treatment options, and is a complaint that carries with it a high risk of complications and morbidity.  The differential diagnosis includes hematoma, ICH, skull fracture, vertebral injury  MDM: Patient seen emergency room for evaluation of a fall.  Physical exam with a hematoma to the left forehead, tenderness along the right femur and knee.  Trauma imaging is reassuringly negative.  Laboratory evaluation with a mild leukocytosis to 12.0 but is otherwise unremarkable.  Patient intermittently in A-fib but is currently taking her anticoagulation and has not taken her nighttime metoprolol which likely explains why her rates are minimally elevated.  She is able to ambulate in the emergency department without difficulty and with a negative trauma work-up  she is safe for discharge with outpatient follow-up.  She was given strict return precautions which she voiced understanding she was discharged.   Additional history obtained: -Additional history obtained from multiple family members -External records from outside source obtained and reviewed including: Chart review including previous notes, labs, imaging, consultation notes   Lab Tests: -I ordered, reviewed, and interpreted labs.   The pertinent results include:   Labs Reviewed  COMPREHENSIVE METABOLIC PANEL - Abnormal; Notable for the following components:      Result Value   Glucose, Bld 128 (*)    Total Protein 6.4 (*)    Albumin 3.3 (*)    Total Bilirubin 0.2 (*)    GFR, Estimated 58 (*)    All other components within normal limits  CBC WITH DIFFERENTIAL/PLATELET - Abnormal; Notable for the following components:   WBC 12.0 (*)    Monocytes Absolute 1.5 (*)    All other components within normal limits     Imaging Studies ordered: I ordered imaging studies including CT head, C-spine, L-spine, x-ray knee and femur I independently visualized and interpreted imaging. I agree with the radiologist interpretation   Medicines ordered and prescription drug management: Meds ordered this encounter  Medications   fentaNYL (SUBLIMAZE) injection 50 mcg   DISCONTD: metoprolol succinate (TOPROL-XL) 24 hr tablet 50 mg   metoprolol succinate (TOPROL-XL) 24 hr tablet 50 mg    -I have reviewed the patients home medicines and have made adjustments as needed  Critical interventions Trauma activation and evaluation   Cardiac Monitoring: The patient was maintained on a cardiac monitor.  I personally viewed and interpreted the cardiac monitored which showed an underlying rhythm of: A-fib  Social Determinants of Health:  Factors impacting patients care include: none   Reevaluation: After the interventions noted above, I reevaluated the patient and found that they have :improved  Co  morbidities that complicate the patient evaluation  Past Medical History:  Diagnosis Date   Arthritis    Asthma    GERD (gastroesophageal reflux disease)    High cholesterol    Hypothyroidism    LBBB (left bundle branch block)    Ventricular ectopy       Dispostion: I considered admission for this patient, but with negative trauma work-up she is safe for discharge with outpatient follow-up     Final Clinical Impression(s) / ED Diagnoses Final diagnoses:  Fall,  initial encounter  Hematoma of scalp, initial encounter     '@PCDICTATION'$ @    Channing Yeager, Debe Coder, MD 03/30/22 [id number]

## 2022-03-30 NOTE — ED Notes (Signed)
Patient transferred to CT

## 2022-03-31 DIAGNOSIS — K219 Gastro-esophageal reflux disease without esophagitis: Secondary | ICD-10-CM | POA: Diagnosis not present

## 2022-03-31 DIAGNOSIS — I480 Paroxysmal atrial fibrillation: Secondary | ICD-10-CM | POA: Diagnosis not present

## 2022-03-31 DIAGNOSIS — J4530 Mild persistent asthma, uncomplicated: Secondary | ICD-10-CM | POA: Diagnosis not present

## 2022-03-31 DIAGNOSIS — E7800 Pure hypercholesterolemia, unspecified: Secondary | ICD-10-CM | POA: Diagnosis not present

## 2022-03-31 DIAGNOSIS — E039 Hypothyroidism, unspecified: Secondary | ICD-10-CM | POA: Diagnosis not present
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
## 2022-04-04 ENCOUNTER — Ambulatory Visit: Payer: Medicare Other | Attending: Nurse Practitioner | Admitting: Nurse Practitioner

## 2022-04-04 ENCOUNTER — Encounter: Payer: Self-pay | Admitting: Nurse Practitioner

## 2022-04-04 ENCOUNTER — Ambulatory Visit (INDEPENDENT_AMBULATORY_CARE_PROVIDER_SITE_OTHER): Payer: Medicare Other

## 2022-04-04 VITALS — BP 110/62 | HR 72 | Ht 61.0 in | Wt 142.0 lb

## 2022-04-04 DIAGNOSIS — I480 Paroxysmal atrial fibrillation: Secondary | ICD-10-CM

## 2022-04-04 DIAGNOSIS — I340 Nonrheumatic mitral (valve) insufficiency: Secondary | ICD-10-CM | POA: Diagnosis not present

## 2022-04-04 DIAGNOSIS — R55 Syncope and collapse: Secondary | ICD-10-CM

## 2022-04-04 DIAGNOSIS — I447 Left bundle-branch block, unspecified: Secondary | ICD-10-CM | POA: Diagnosis not present

## 2022-04-04 DIAGNOSIS — I5032 Chronic diastolic (congestive) heart failure: Secondary | ICD-10-CM

## 2022-04-04 DIAGNOSIS — Z7901 Long term (current) use of anticoagulants: Secondary | ICD-10-CM

## 2022-04-04 DIAGNOSIS — E785 Hyperlipidemia, unspecified: Secondary | ICD-10-CM | POA: Diagnosis not present

## 2022-04-04 DIAGNOSIS — I358 Other nonrheumatic aortic valve disorders: Secondary | ICD-10-CM | POA: Diagnosis not present

## 2022-04-04 NOTE — Patient Instructions (Signed)
Medication Instructions:   You can take one extra half (0.5 ) tablet by mouth (20 mg) as needed for weight gain of 3 lbs in 24 hours or 5 lbs in one week.  *If you need a refill on your cardiac medications before your next appointment, please call your pharmacy*   Lab Work:  None ordered.  If you have labs (blood work) drawn today and your tests are completely normal, you will receive your results only by: [place] (if you have MyChart) OR A paper copy in the mail If you have any lab test that is abnormal or we need to change your treatment, we will call you to review the results.   Testing/Procedures:  [facility]- Long Term Monitor Instructions  Your physician has requested you wear a ZIO patch monitor for 14 days.  This is a single patch monitor. [employer] supplies one patch monitor per enrollment. Additional stickers are not available. Please do not apply patch if you will be having a Nuclear Stress Test,  Echocardiogram, Cardiac CT, MRI, or Chest Xray during the period you would be wearing the  monitor. The patch cannot be worn during these tests. You cannot remove and re-apply the  ZIO XT patch monitor.  Your ZIO patch monitor will be mailed 3 day USPS to your address on file. It may take 3-5 days  to receive your monitor after you have been enrolled.  Once you have received your monitor, please review the enclosed instructions. Your monitor  has already been registered assigning a specific monitor serial # to you.  Billing and Patient Assistance Program Information  We have supplied [employer] with any of your insurance information on file for billing purposes. [employer] offers a sliding scale Patient Assistance Program for patients that do not have  insurance, or whose insurance does not completely cover the cost of the ZIO monitor.  You must apply for the Patient Assistance Program to qualify for this discounted rate.  To apply, please call [employer] at [phone], select  option 4, select option 2, ask to apply for  Patient Assistance Program. Samantha [name] will ask your household income, and how many people  are in your household. They will quote your out-of-pocket cost based on that information.  [employer] will also be able to set up a 35-month interest-free payment plan if needed.  Applying the monitor   Shave hair from upper left chest.  Hold abrader disc by orange tab. Rub abrader in 40 strokes over the upper left chest as  indicated in your monitor instructions.  Clean area with 4 enclosed alcohol pads. Let dry.  Apply patch as indicated in monitor instructions. Patch will be placed under collarbone on left  side of chest with arrow pointing upward.  Rub patch adhesive wings for 2 minutes. Remove white label marked "1". Remove the white  label marked "2". Rub patch adhesive wings for 2 additional minutes.  While looking in a mirror, press and release button in center of patch. A small green light will  flash 3-4 times. This will be your only indicator that the monitor has been turned on.  Do not shower for the first 24 hours. You may shower after the first 24 hours.  Press the button if you feel a symptom. You will hear a small click. Record Date, Time and  Symptom in the Patient Logbook.  When you are ready to remove the patch, follow instructions on the last 2 pages of Patient  Logbook. Stick patch monitor  onto the last page of Patient Logbook.  Place Patient Logbook in the blue and white box. Use locking tab on box and tape box closed  securely. The blue and white box has prepaid postage on it. Please place it in the mailbox as  soon as possible. Your physician should have your test results approximately 7 days after the  monitor has been mailed back to [facility].  Call [place] at [phone] if you have questions regarding  your ZIO XT patch monitor. Call them immediately if you see an orange light blinking on your  monitor.   If your monitor falls off in less than 4 days, contact our Monitor department at [phone].  If your monitor becomes loose or falls off after 4 days call [employer] at [phone] for  suggestions on securing your monitor    Follow-Up: At [facility], you and your health needs are our priority.  As part of our continuing mission to provide you with exceptional heart care, we have created designated Provider Care Teams.  These Care Teams include your primary Cardiologist (physician) and Advanced Practice Providers (APPs -  Physician Assistants and Nurse Practitioners) who all work together to provide you with the care you need, when you need it.  We recommend signing up for the patient portal called "MyChart".  Sign up information is provided on this After Visit Summary.  MyChart is used to connect with patients for Virtual Visits (Telemedicine).  Patients are able to view lab/test results, encounter notes, upcoming appointments, etc.  Non-urgent messages can be sent to your provider as well.   To learn more about what you can do with MyChart, go to [email or web address].    Your next appointment:   2 month(s)  The format for your next appointment:   In Person  Provider:   Jenkins Rouge, MD     Other Instructions  Heart Failure Education: Weigh yourself EVERY morning after you go to the bathroom but before you eat or drink anything. Write this number down in a weight log/diary. If you gain 3 pounds overnight or 5 pounds in a week, call the office. Take your medicines as prescribed. If you have concerns about your medications, please call us before you stop taking them.  Eat low salt foods--Limit salt (sodium) to 2000 mg per day. This will help prevent your body from holding onto fluid. Read food labels as many processed foods have a lot of sodium, especially canned goods and prepackaged meats. If you would like some assistance choosing low sodium foods, we would be happy to  set you up with a nutritionist. Stay as active as you can everyday. Staying active will give you more energy and make your muscles stronger. Start with 5 minutes at a time and work your way up to 30 minutes a day. Break up your activities--do some in the morning and some in the afternoon. Start with 3 days per week and work your way up to 5 days as you can.  If you have chest pain, feel short of breath, dizzy, or lightheaded, STOP. If you don't feel better after a short rest, call 911. If you do feel better, call the office to let us know you have symptoms with exercise. Limit all fluids for the day to less than 2 liters. Fluid includes all drinks, coffee, juice, ice chips, soup, jello, and all other liquids.   Important Information About Sugar

## 2022-04-04 NOTE — Progress Notes (Unsigned)
Applied a 14 day Zio XT monitor to patient in the office  Dr Johnsie Cancel to read

## 2022-04-08 DIAGNOSIS — H6123 Impacted cerumen, bilateral: Secondary | ICD-10-CM | POA: Diagnosis not present

## 2022-04-08 DIAGNOSIS — H7292 Unspecified perforation of tympanic membrane, left ear: Secondary | ICD-10-CM | POA: Diagnosis not present

## 2022-04-10 DIAGNOSIS — I700 Atherosclerosis of aorta: Secondary | ICD-10-CM | POA: Diagnosis not present

## 2022-04-10 DIAGNOSIS — I509 Heart failure, unspecified: Secondary | ICD-10-CM | POA: Diagnosis not present

## 2022-04-10 DIAGNOSIS — S0003XD Contusion of scalp, subsequent encounter: Secondary | ICD-10-CM | POA: Diagnosis not present

## 2022-04-10 DIAGNOSIS — W19XXXD Unspecified fall, subsequent encounter: Secondary | ICD-10-CM | POA: Diagnosis not present

## 2022-04-17 ENCOUNTER — Telehealth: Payer: Self-pay | Admitting: Cardiovascular Disease

## 2022-04-17 DIAGNOSIS — I480 Paroxysmal atrial fibrillation: Secondary | ICD-10-CM | POA: Diagnosis not present

## 2022-04-17 DIAGNOSIS — I509 Heart failure, unspecified: Secondary | ICD-10-CM | POA: Diagnosis not present

## 2022-04-17 DIAGNOSIS — K219 Gastro-esophageal reflux disease without esophagitis: Secondary | ICD-10-CM | POA: Diagnosis not present

## 2022-04-17 DIAGNOSIS — J4530 Mild persistent asthma, uncomplicated: Secondary | ICD-10-CM | POA: Diagnosis not present

## 2022-04-17 DIAGNOSIS — E7800 Pure hypercholesterolemia, unspecified: Secondary | ICD-10-CM | POA: Diagnosis not present

## 2022-04-17 DIAGNOSIS — E039 Hypothyroidism, unspecified: Secondary | ICD-10-CM | POA: Diagnosis not present

## 2022-04-17 MED ORDER — FUROSEMIDE 40 MG PO TABS: 20.0000 mg | ORAL_TABLET | Freq: Every day | ORAL | 3 refills | Status: DC

## 2022-04-17 NOTE — Telephone Encounter (Signed)
*  STAT* If patient is at the pharmacy, call can be transferred to refill team.   1. Which medications need to be refilled? (please list name of each medication and dose if known)  furosemide (LASIX) 40 MG tablet  2. Which pharmacy/location (including street and city if local pharmacy) is medication to be sent to? [facility] - [place], [place] - [place] [address]   3. Do they need a 30 day or 90 day supply?   90 day supply

## 2022-04-17 NOTE — Telephone Encounter (Signed)
Pt's medication was sent to pt's pharmacy requested. Confirmation received.

## 2022-04-30 ENCOUNTER — Encounter: Payer: Self-pay | Admitting: Podiatry

## 2022-04-30 ENCOUNTER — Ambulatory Visit: Payer: Medicare Other | Admitting: Podiatry

## 2022-04-30 DIAGNOSIS — M79676 Pain in unspecified toe(s): Secondary | ICD-10-CM | POA: Diagnosis not present

## 2022-04-30 DIAGNOSIS — B351 Tinea unguium: Secondary | ICD-10-CM

## 2022-04-30 DIAGNOSIS — R55 Syncope and collapse: Secondary | ICD-10-CM | POA: Diagnosis not present

## 2022-05-03 NOTE — Progress Notes (Signed)
  Subjective:  Patient ID: Samantha [name], female    DOB: 1929-12-04,  MRN: [id number]  [name] presents to clinic today for at risk foot care. Patient has h/o PAD and painful thick toenails that are difficult to trim. Pain interferes with ambulation. Aggravating factors include wearing enclosed shoe gear. Pain is relieved with periodic professional debridement.  Chief Complaint  Patient presents with   Nail Problem    Routine foot care PCP-Raju PCP VST-2 weeks   New problem(s): None.   PCP is Charlane Ferretti, MD.  Allergies  Allergen Reactions   Erythromycin Diarrhea and Rash   Alendronate Other (See Comments)    Severe stomach cramps- made the patient HORRIBLY SICK   Alendronate Sodium     Other reaction(s): nausea   Azithromycin Diarrhea   Bacitracin-Polymyxin B Rash   Benzalkonium Chloride Rash   Erythromycin Rash   Neo-Bacit-Poly-Lidocaine Rash   Neosporin [Neomycin-Bacitracin Zn-Polymyx] Rash   Penicillins Rash    DID THE REACTION INVOLVE: Swelling of the face/tongue/throat, SOB, or low BP? N Sudden or severe rash/hives, skin peeling, or the inside of the mouth or nose? Y Did it require medical treatment? N When did it last happen?      Childhood If all above answers are "NO", may proceed with cephalosporin use.  Other reaction(s): rash Other reaction(s): rash    Review of Systems: Negative except as noted in the HPI.  Objective:   There were no vitals filed for this visit.  Samantha [name] is a pleasant 88 y.o. female WD, WN in NAD. AAO x 3. Vascular Capillary refill time to digits immediate b/l. Palpable DP pulse(s) b/l LE. Diminished PT pulse(s) b/l LE. Pedal hair sparse. No pain with calf compression b/l. Varicosities present b/l. No cyanosis or clubbing noted b/l LE.  Neurologic Normal speech. Oriented to person, place, and time. Protective sensation intact 5/5 intact bilaterally with 10g monofilament b/l. Vibratory sensation intact b/l.  Dermatologic  Pedal integument with normal turgor, texture and tone BLE. No open wounds b/l LE. No interdigital macerations noted b/l LE. Toenails 1-5 b/l elongated, discolored, dystrophic, thickened, crumbly with subungual debris and tenderness to dorsal palpation.   No hyperkeratotic nor porokeratotic lesions present on today's visit.  Orthopedic: Muscle strength 5/5 to all lower extremity muscle groups bilaterally. Tailor's bunion deformity noted b/l LE. Hammertoe deformity noted 2-5 b/l. Utilizes cane for ambulation assistance.   Radiographs: None  Assessment/Plan: 1. Pain due to onychomycosis of toenail     No orders of the defined types were placed in this encounter.   -Consent given for treatment as described below: -Continue supportive shoe gear daily. -Toenails 1-5 b/l were debrided in length and girth with sterile nail nippers and dremel without iatrogenic bleeding.  -Patient/POA to call should there be question/concern in the interim.   Return in about 3 months (around 07/31/2022).  Marzetta Board, DPM

## 2022-06-06 DIAGNOSIS — J029 Acute pharyngitis, unspecified: Secondary | ICD-10-CM | POA: Diagnosis not present

## 2022-06-06 DIAGNOSIS — J0100 Acute maxillary sinusitis, unspecified: Secondary | ICD-10-CM | POA: Diagnosis not present

## 2022-06-11 DIAGNOSIS — H7292 Unspecified perforation of tympanic membrane, left ear: Secondary | ICD-10-CM | POA: Diagnosis not present

## 2022-06-11 DIAGNOSIS — J3489 Other specified disorders of nose and nasal sinuses: Secondary | ICD-10-CM | POA: Insufficient documentation

## 2022-06-11 DIAGNOSIS — H6123 Impacted cerumen, bilateral: Secondary | ICD-10-CM | POA: Diagnosis not present
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
## 2022-06-20 DIAGNOSIS — H524 Presbyopia: Secondary | ICD-10-CM | POA: Diagnosis not present

## 2022-06-20 DIAGNOSIS — Z961 Presence of intraocular lens: Secondary | ICD-10-CM | POA: Diagnosis not present

## 2022-06-20 DIAGNOSIS — H353131 Nonexudative age-related macular degeneration, bilateral, early dry stage: Secondary | ICD-10-CM | POA: Diagnosis not present

## 2022-07-02 ENCOUNTER — Ambulatory Visit: Payer: Medicare HMO | Attending: Cardiovascular Disease | Admitting: Cardiovascular Disease

## 2022-07-02 ENCOUNTER — Encounter: Payer: Self-pay | Admitting: Cardiovascular Disease

## 2022-07-02 VITALS — BP 112/60 | HR 80 | Ht 61.0 in | Wt 143.0 lb

## 2022-07-02 DIAGNOSIS — E785 Hyperlipidemia, unspecified: Secondary | ICD-10-CM

## 2022-07-02 DIAGNOSIS — I480 Paroxysmal atrial fibrillation: Secondary | ICD-10-CM

## 2022-07-02 DIAGNOSIS — I10 Essential (primary) hypertension: Secondary | ICD-10-CM | POA: Diagnosis not present

## 2022-07-02 DIAGNOSIS — I340 Nonrheumatic mitral (valve) insufficiency: Secondary | ICD-10-CM

## 2022-07-02 DIAGNOSIS — I358 Other nonrheumatic aortic valve disorders: Secondary | ICD-10-CM

## 2022-07-02 DIAGNOSIS — Z7901 Long term (current) use of anticoagulants: Secondary | ICD-10-CM | POA: Diagnosis not present

## 2022-07-02 NOTE — Patient Instructions (Addendum)
Medication Instructions:  Your physician recommends that you continue on your current medications as directed. Please refer to the Current Medication list given to you today.  *If you need a refill on your cardiac medications before your next appointment, please call your pharmacy*  Lab Work: If you have labs (blood work) drawn today and your tests are completely normal, you will receive your results only by: [place] (if you have MyChart) OR A paper copy in the mail If you have any lab test that is abnormal or we need to change your treatment, we will call you to review the results.  Testing/Procedures: Your physician has requested that you have an echocardiogram. Echocardiography is a painless test that uses sound waves to create images of your heart. It provides your doctor with information about the size and shape of your heart and how well your heart's chambers and valves are working. This procedure takes approximately one hour. There are no restrictions for this procedure. Please do NOT wear cologne, perfume, aftershave, or lotions (deodorant is allowed). Please arrive 15 minutes prior to your appointment time.   Your physician has requested that you have a carotid duplex. This test is an ultrasound of the carotid arteries in your neck. It looks at blood flow through these arteries that supply the brain with blood. Allow one hour for this exam. There are no restrictions or special instructions.  Follow-Up: At [facility], you and your health needs are our priority.  As part of our continuing mission to provide you with exceptional heart care, we have created designated Provider Care Teams.  These Care Teams include your primary Cardiologist (physician) and Advanced Practice Providers (APPs -  Physician Assistants and Nurse Practitioners) who all work together to provide you with the care you need, when you need it.  We recommend signing up for the patient portal called  "MyChart".  Sign up information is provided on this After Visit Summary.  MyChart is used to connect with patients for Virtual Visits (Telemedicine).  Patients are able to view lab/test results, encounter notes, upcoming appointments, etc.  Non-urgent messages can be sent to your provider as well.   To learn more about what you can do with MyChart, go to [email or web address].    Your next appointment:   12 month(s)  Provider:   Jenkins Rouge, MD

## 2022-07-03 ENCOUNTER — Other Ambulatory Visit: Payer: Self-pay | Admitting: Cardiovascular Disease

## 2022-07-03 DIAGNOSIS — E785 Hyperlipidemia, unspecified: Secondary | ICD-10-CM

## 2022-07-03 DIAGNOSIS — I10 Essential (primary) hypertension: Secondary | ICD-10-CM

## 2022-07-03 DIAGNOSIS — I340 Nonrheumatic mitral (valve) insufficiency: Secondary | ICD-10-CM

## 2022-07-03 DIAGNOSIS — I480 Paroxysmal atrial fibrillation: Secondary | ICD-10-CM

## 2022-07-03 DIAGNOSIS — Z7901 Long term (current) use of anticoagulants: Secondary | ICD-10-CM

## 2022-07-03 DIAGNOSIS — I358 Other nonrheumatic aortic valve disorders: Secondary | ICD-10-CM

## 2022-07-07 ENCOUNTER — Ambulatory Visit (HOSPITAL_COMMUNITY)
Admission: RE | Admit: 2022-07-07 | Discharge: 2022-07-07 | Disposition: A | Discharge status: Home / Self Care | Payer: Medicare HMO | Source: Ambulatory Visit | Attending: Cardiology | Admitting: Cardiology

## 2022-07-07 DIAGNOSIS — R55 Syncope and collapse: Secondary | ICD-10-CM | POA: Insufficient documentation

## 2022-07-07 DIAGNOSIS — I340 Nonrheumatic mitral (valve) insufficiency: Secondary | ICD-10-CM | POA: Insufficient documentation

## 2022-07-07 DIAGNOSIS — Z7901 Long term (current) use of anticoagulants: Secondary | ICD-10-CM | POA: Diagnosis not present

## 2022-07-07 DIAGNOSIS — I358 Other nonrheumatic aortic valve disorders: Secondary | ICD-10-CM | POA: Insufficient documentation

## 2022-07-07 DIAGNOSIS — I480 Paroxysmal atrial fibrillation: Secondary | ICD-10-CM | POA: Diagnosis not present

## 2022-07-07 DIAGNOSIS — E785 Hyperlipidemia, unspecified: Secondary | ICD-10-CM | POA: Diagnosis not present

## 2022-07-07 DIAGNOSIS — I10 Essential (primary) hypertension: Secondary | ICD-10-CM | POA: Diagnosis not present

## 2022-07-08 DIAGNOSIS — I10 Essential (primary) hypertension: Secondary | ICD-10-CM | POA: Diagnosis not present

## 2022-07-08 DIAGNOSIS — E876 Hypokalemia: Secondary | ICD-10-CM | POA: Diagnosis not present

## 2022-07-08 DIAGNOSIS — R32 Unspecified urinary incontinence: Secondary | ICD-10-CM | POA: Diagnosis not present

## 2022-07-08 DIAGNOSIS — K219 Gastro-esophageal reflux disease without esophagitis: Secondary | ICD-10-CM | POA: Diagnosis not present

## 2022-07-08 DIAGNOSIS — E039 Hypothyroidism, unspecified: Secondary | ICD-10-CM | POA: Diagnosis not present

## 2022-07-08 DIAGNOSIS — Z8249 Family history of ischemic heart disease and other diseases of the circulatory system: Secondary | ICD-10-CM | POA: Diagnosis not present

## 2022-07-08 DIAGNOSIS — I4891 Unspecified atrial fibrillation: Secondary | ICD-10-CM | POA: Diagnosis not present

## 2022-07-08 DIAGNOSIS — Z809 Family history of malignant neoplasm, unspecified: Secondary | ICD-10-CM | POA: Diagnosis not present

## 2022-07-08 DIAGNOSIS — D6869 Other thrombophilia: Secondary | ICD-10-CM | POA: Diagnosis not present

## 2022-07-08 DIAGNOSIS — M810 Age-related osteoporosis without current pathological fracture: Secondary | ICD-10-CM | POA: Diagnosis not present

## 2022-07-08 DIAGNOSIS — R609 Edema, unspecified: Secondary | ICD-10-CM | POA: Diagnosis not present

## 2022-07-08 DIAGNOSIS — E785 Hyperlipidemia, unspecified: Secondary | ICD-10-CM | POA: Diagnosis not present

## 2022-07-10 DIAGNOSIS — E039 Hypothyroidism, unspecified: Secondary | ICD-10-CM | POA: Diagnosis not present

## 2022-07-10 DIAGNOSIS — J4530 Mild persistent asthma, uncomplicated: Secondary | ICD-10-CM | POA: Diagnosis not present

## 2022-07-10 DIAGNOSIS — I509 Heart failure, unspecified: Secondary | ICD-10-CM | POA: Diagnosis not present

## 2022-07-10 DIAGNOSIS — E7800 Pure hypercholesterolemia, unspecified: Secondary | ICD-10-CM | POA: Diagnosis not present

## 2022-07-10 DIAGNOSIS — I480 Paroxysmal atrial fibrillation: Secondary | ICD-10-CM | POA: Diagnosis not present

## 2022-07-10 DIAGNOSIS — K219 Gastro-esophageal reflux disease without esophagitis: Secondary | ICD-10-CM | POA: Diagnosis not present

## 2022-07-24 ENCOUNTER — Ambulatory Visit (HOSPITAL_COMMUNITY): Payer: Medicare HMO | Attending: Cardiovascular Disease

## 2022-07-24 DIAGNOSIS — I358 Other nonrheumatic aortic valve disorders: Secondary | ICD-10-CM | POA: Diagnosis not present

## 2022-07-24 DIAGNOSIS — Z7901 Long term (current) use of anticoagulants: Secondary | ICD-10-CM | POA: Diagnosis not present

## 2022-07-24 DIAGNOSIS — E785 Hyperlipidemia, unspecified: Secondary | ICD-10-CM | POA: Insufficient documentation

## 2022-07-24 DIAGNOSIS — I340 Nonrheumatic mitral (valve) insufficiency: Secondary | ICD-10-CM | POA: Diagnosis not present

## 2022-07-24 DIAGNOSIS — I480 Paroxysmal atrial fibrillation: Secondary | ICD-10-CM | POA: Diagnosis not present

## 2022-07-24 DIAGNOSIS — I10 Essential (primary) hypertension: Secondary | ICD-10-CM | POA: Diagnosis not present

## 2022-07-24 LAB — ECHOCARDIOGRAM COMPLETE
Area-P 1/2: 3.72 cm2
MV M vel: 4.81 m/s
MV Peak grad: 92.5 mmHg
Radius: 0.5 cm
S' Lateral: 3 cm

## 2022-07-29 ENCOUNTER — Telehealth: Payer: Self-pay | Admitting: Cardiovascular Disease

## 2022-07-29 NOTE — Telephone Encounter (Signed)
Patient aware of results.

## 2022-07-29 NOTE — Telephone Encounter (Signed)
Pt is returning call in regards to results. Transferred to [facility], RN.

## 2022-07-30 ENCOUNTER — Telehealth: Payer: Self-pay

## 2022-07-30 NOTE — Telephone Encounter (Signed)
Patient sent in letter for Dr. Johnsie Cancel,   "If I sleep on my left side, what causes my right arm to be asleep/numb and tingling, and right hand to be stiff/burning sensation when I wake up. Sensation lasts, only, a couple minutes until I am moving around."  Per Dr. Johnsie Cancel, could be carpal tunnel, please see PCP.   Called patient with Dr. Kyla Balzarine response.

## 2022-08-01 ENCOUNTER — Ambulatory Visit (HOSPITAL_COMMUNITY)
Admission: EM | Admit: 2022-08-01 | Discharge: 2022-08-01 | Disposition: A | Discharge status: Home / Self Care | Payer: Medicare HMO | Attending: Internal Medicine | Admitting: Internal Medicine

## 2022-08-01 ENCOUNTER — Encounter (HOSPITAL_COMMUNITY): Payer: Self-pay | Admitting: *Deleted

## 2022-08-01 DIAGNOSIS — T8130XA Disruption of wound, unspecified, initial encounter: Secondary | ICD-10-CM

## 2022-08-01 DIAGNOSIS — L03312 Cellulitis of back [any part except buttock]: Secondary | ICD-10-CM

## 2022-08-01 DIAGNOSIS — D225 Melanocytic nevi of trunk: Secondary | ICD-10-CM | POA: Diagnosis not present

## 2022-08-01 MED ORDER — CEPHALEXIN 500 MG PO CAPS: 500.0000 mg | ORAL_CAPSULE | Freq: Two times a day (BID) | ORAL | 0 refills | Status: DC

## 2022-08-01 NOTE — ED Triage Notes (Signed)
Pt states she has a mole on the left side of her back and she rubbed it last night and she noticed a lot of bleeding a lot. She can't see the spot but would like it checked out. She did put antibiotic cream on the Band-Aid but she can't see to tell if she needs the cream or even got it on the correct spot. She lives alone.

## 2022-08-01 NOTE — ED Provider Notes (Signed)
[address]    CSN: [id number] Arrival date & time: 08/01/22  1510      History   Chief Complaint Chief Complaint  Patient presents with   Wound Check    HPI Samantha [name] is a 89 y.o. female.   Patient here for evaluation of wound to the left lower back that happened last night. Patient went to itch an area of the left lower back with her left  thumb when she accidentally scratched one of her moles on her back and saw blood on her thumb after scratching the site. She has had this mole for "years" and has never had problems with it. Denies recent changes in border, color, or any asymmetry to the mole. No recent redness, swelling, or pain to the site prior to injuring it after scratching. She has placed a bandage over the wound, however states this took great effort due to decreased range of motion of the left upper extremity and hard to reach spot on her back. She has noticed some redness surrounding the site when looking in the mirror and is concerned that the site may become infected due to history of cellulitis. Takes eloquis, states bleeding stopped quickly with pressure. Denies recent antibiotic or steroid use. She has not had any fevers and denies significant pain to the area. Attempted use of over the counter antibiotic ointment to the wound, however unsure if this reached the wound as she had a difficult time applying bandage. Patient lives alone and is very independent.      Past Medical History:  Diagnosis Date   Arthritis    Asthma    GERD (gastroesophageal reflux disease)    High cholesterol    Hypothyroidism    LBBB (left bundle branch block)    Ventricular ectopy     Patient Active Problem List   Diagnosis Date Noted   Anxiety 01/15/2022   Tick bite 10/10/2021   Cellulitis 10/09/2021   Blood clotting disorder ([place]) 11/20/2020   Age-related osteoporosis without current pathological fracture 07/30/2020   Hardening of the aorta (main artery of the  heart) ([place]) 07/30/2020   Heart failure ([place]) 07/30/2020   Intestinal malabsorption 07/30/2020   Mild intermittent asthma 07/30/2020   Pure hypercholesterolemia 07/30/2020   Thrombophilia ([place]) 07/30/2020   Neck pain 04/03/2020   Long term (current) use of anticoagulants 03/05/2020   Unilateral primary osteoarthritis, right knee 01/26/2020   BPPV (benign paroxysmal positional vertigo), right 05/13/2019   Anticoagulated 05/13/2019   Epistaxis 05/13/2019   Carpal tunnel syndrome, right upper limb 04/12/2019   Trigger thumb, left thumb 02/10/2019   Numbness and tingling in right hand 02/10/2019   Pain in left foot 02/10/2019   Closed fracture of right proximal tibia 06/22/2018   Paroxysmal atrial fibrillation ([place]) 05/29/2018   Hypokalemia 05/29/2018   Pain and swelling of right lower extremity 05/28/2018   Closed fracture of shaft of tibia 04/29/2018   Aftercare 04/23/2018   SAH (subarachnoid hemorrhage) ([place]) 04/02/2018   Chronic bilateral low back pain with bilateral sciatica 03/08/2018   Abnormal CT scan, chest 08/08/2016   Hearing loss 09/10/2015   Impacted cerumen of right ear 09/10/2015   Otorrhea of left ear 09/10/2015   Tympanic membrane perforation, left 09/10/2015   Near syncope 01/06/2015   Left leg weakness 01/06/2015   Hyperlipidemia    Hypothyroidism    Ventricular ectopy    Chest pain 05/22/2014   Left bundle branch block 05/22/2014   PVC's (premature  ventricular contractions) 05/22/2014   HOARSENESS 02/14/2010   CHEST PAIN, ATYPICAL 02/14/2010   Allergic rhinitis 02/13/2010   G E R D 02/13/2010    Past Surgical History:  Procedure Laterality Date   CARDIOVERSION N/A 11/27/2015   Procedure: CARDIOVERSION;  Surgeon: Josue Hector, MD;  Location: [facility] ENDOSCOPY;  Service: Cardiovascular;  Laterality: N/A;   KNEE ARTHROSCOPY     TIBIA IM NAIL INSERTION Right 04/05/2018   Procedure: INTRAMEDULLARY (IM) NAIL TIBIAL;  Surgeon: Nicholes Stairs, MD;  Location:  [facility];  Service: Orthopedics;  Laterality: Right;    OB History   No obstetric history on file.      Home Medications    Prior to Admission medications   Medication Sig Start Date End Date Taking? Authorizing Provider  albuterol (VENTOLIN HFA) 108 (90 Base) MCG/ACT inhaler Inhale 1-2 puffs into the lungs every 6 (six) hours as needed for wheezing or shortness of breath. 08/01/20  Yes Collene Gobble, MD  apixaban (ELIQUIS) 5 MG TABS tablet Take 5 mg by mouth in the morning and at bedtime.   Yes [provider]  Calcium-Magnesium-Zinc (CALCIUM-MAGNESUIUM-ZINC PO) Take 1 tablet by mouth every Monday, Wednesday, and Friday.   Yes [provider]  cephALEXin (KEFLEX) 500 MG capsule Take 1 capsule (500 mg total) by mouth 2 (two) times daily for 5 days. 08/01/22 08/06/22 Yes StanhopeStasia Cavalier, FNP  Cholecalciferol (VITAMIN D3) 50 MCG (2000 UT) TABS Take 2,000 Units by mouth in the morning.   Yes [provider]  denosumab (PROLIA) 60 MG/ML SOSY injection Inject 60 mg into the skin every 6 (six) months.   Yes [provider]  FLUoxetine (PROZAC) 10 MG capsule Take 10 mg by mouth daily. 01/09/22  Yes [provider]  furosemide (LASIX) 20 MG tablet Take 20 mg by mouth daily.   Yes [provider]  hydrocortisone cream 1 % Apply 1 application. topically daily as needed for itching (affected areas).   Yes [provider]  levothyroxine (SYNTHROID) 125 MCG tablet Take 125 mcg by mouth daily before breakfast. 08/17/20  Yes [provider]  loratadine (CLARITIN) 10 MG tablet Take 10 mg by mouth in the morning.   Yes [provider]  lovastatin (MEVACOR) 20 MG tablet Take 20 mg by mouth at bedtime.   Yes [provider]  Lubricants (LUBRICATING JELLY) GEL Place 1 application. vaginally in the morning and at bedtime.   Yes [provider]  metoprolol succinate (TOPROL-XL) 50 MG 24 hr tablet Take 50 mg by mouth  at bedtime. Take with or immediately following a meal.   Yes [provider]  montelukast (SINGULAIR) 10 MG tablet Take 10 mg by mouth at bedtime.   Yes [provider]  Multiple Vitamins-Minerals (PRESERVISION AREDS PO) Take 1 capsule by mouth 2 (two) times daily.   Yes [provider]  mupirocin ointment (BACTROBAN) 2 % Apply 1 application. topically 2 (two) times daily. Apply to area on foot. 11/05/21  Yes Nyoka Lint, PA-C  Omega-3 Fatty Acids (FISH OIL) 1000 MG CAPS Take 1,000 mg by mouth 3 (three) times daily.   Yes [provider]  omeprazole (PRILOSEC) 40 MG capsule Take 40 mg by mouth at bedtime.   Yes [provider]  polyethylene glycol (MIRALAX / GLYCOLAX) packet Take 8.5 g by mouth every other day.   Yes [provider]  potassium chloride (K-DUR) 10 MEQ tablet Take 1 tablet (10 mEq total) by mouth daily.  10/24/15  Yes Josue Hector, MD  sodium chloride (OCEAN) 0.65 % SOLN nasal spray Place 1 spray into both nostrils as needed for congestion.   Yes [provider]  vitamin B-12 (CYANOCOBALAMIN) 100 MCG tablet Take 100 mcg by mouth every Monday, Wednesday, and Friday.   Yes [provider]    Family History Family History  Problem Relation Age of Onset   Heart attack Mother    Throat cancer Father    Congestive Heart Failure Sister    Congestive Heart Failure Brother     Social History Social History   Tobacco Use   Smoking status: Never    Passive exposure: Yes   Smokeless tobacco: Never  Vaping Use   Vaping Use: Never used  Substance Use Topics   Alcohol use: No    Alcohol/week: 0.0 standard drinks of alcohol   Drug use: No     Allergies   Doxycycline, Erythromycin, Alendronate, Alendronate sodium, Azithromycin, Bacitracin-polymyxin b, Benzalkonium chloride, Erythromycin, Neo-bacit-poly-lidocaine, Neosporin [neomycin-bacitracin zn-polymyx], and Penicillins   Review of Systems Review of  Systems Per HPI  Physical Exam Triage Vital Signs ED Triage Vitals  Enc Vitals Group     BP 08/01/22 1619 (!) 148/87     Pulse Rate 08/01/22 1619 67     Resp 08/01/22 1619 18     Temp 08/01/22 1619 98.2 F (36.8 C)     Temp Source 08/01/22 1619 Oral     SpO2 08/01/22 1619 97 %     Weight --      Height --      Head Circumference --      Peak Flow --      Pain Score 08/01/22 1616 0     Pain Loc --      Pain Edu? --      Excl. in [place]? --    No data found.  Updated Vital Signs BP (!) 148/87 (BP Location: Left Arm)   Pulse 67   Temp 98.2 F (36.8 C) (Oral)   Resp 18   SpO2 97%   Visual Acuity Right Eye Distance:   Left Eye Distance:   Bilateral Distance:    Right Eye Near:   Left Eye Near:    Bilateral Near:     Physical Exam Vitals and nursing note reviewed.  Constitutional:      Appearance: She is not ill-appearing or toxic-appearing.  HENT:     Head: Normocephalic and atraumatic.     Right Ear: Hearing and external ear normal.     Left Ear: Hearing and external ear normal.     Nose: Nose normal.     Mouth/Throat:     Lips: Pink.     Mouth: Mucous membranes are moist.     Pharynx: No posterior oropharyngeal erythema.  Eyes:     General: Lids are normal. Vision grossly intact. Gaze aligned appropriately.     Extraocular Movements: Extraocular movements intact.     Conjunctiva/sclera: Conjunctivae normal.  Cardiovascular:     Rate and Rhythm: Normal rate and regular rhythm.     Heart sounds: Normal heart sounds, S1 normal and S2 normal.  Pulmonary:     Effort: Pulmonary effort is normal. No respiratory distress.     Breath sounds: Normal breath sounds and air entry.  Musculoskeletal:     Cervical back: Neck supple.  Skin:    General: Skin is warm and dry.     Capillary Refill: Capillary refill takes less than 2  seconds.     Findings: Lesion present. No rash.          Comments: 0.5-1cm in diameter nevus to the left flank that is non-draining. Small  surrounding area of erythema, no underlying induration or soft-tissue swelling. Tender to palpation. See image below for details.  Neurological:     General: No focal deficit present.     Mental Status: She is alert and oriented to person, place, and time. Mental status is at baseline.     Cranial Nerves: No dysarthria or facial asymmetry.  Psychiatric:        Mood and Affect: Mood normal.        Speech: Speech normal.        Behavior: Behavior normal.        Thought Content: Thought content normal.        Judgment: Judgment normal.      UC Treatments / Results  Labs (all labs ordered are listed, but only abnormal results are displayed) Labs Reviewed - No data to display  EKG   Radiology No results found.  Procedures Procedures (including critical care time)  Medications Ordered in UC Medications - No data to display  Initial Impression / Assessment and Plan / UC Course  I have reviewed the triage vital signs and the nursing notes.  Pertinent labs & imaging results that were available during my care of the patient were reviewed by me and considered in my medical decision making (see chart for details).   1.. Nevus of back, wound disruption, cellulitis of back Slight area of erythema surrounding wound with elevated risk for cellulitis, therefore will treat with keflex twice daily for 5 days. Considered use of mupirocin ointment topically instead of oral antibiotic, however patient lives alone and will likely not be able to accurately apply this. Non-stick gauze and paper tape applied to wound, she may use either this or Band-Aid to the wound and change this twice daily for the next several days until the wound heals appropriately. She has a follow-up appointment with PCP soon and has been advised to discuss this at visit. Advised to return to urgent care or follow-up with PCP sooner if she notices any worsening signs of infection. May use tylenol as needed for any pain to the  area.  Discussed physical exam and available lab work findings in clinic with patient.  Counseled patient regarding appropriate use of medications and potential side effects for all medications recommended or prescribed today. Discussed red flag signs and symptoms of worsening condition,when to call the PCP office, return to urgent care, and when to seek higher level of care in the emergency department. Patient verbalizes understanding and agreement with plan. All questions answered. Patient discharged in stable condition.    Final Clinical Impressions(s) / UC Diagnoses   Final diagnoses:  Wound disruption, initial encounter  Nevus of back  Cellulitis of skin of back     Discharge Instructions      Take Keflex '500mg'$  every 12 hours for the next 5 days to prevent infection to the site. Keep the site clean and dry.  When you shower, let water run over the wound. No not scratch the wound. Change the dressing twice daily with nonstick gauze and tape. Call your primary care provider on Monday to schedule an appointment to make sure that the wound is healing appropriately.  If you develop any new or worsening symptoms or do not improve in the next 2 to 3 days, please return.  If your symptoms are severe, please go to the emergency room.  Follow-up with your primary care provider for further evaluation and management of your symptoms as well as ongoing wellness visits.  I hope you feel better!    ED Prescriptions     Medication Sig Dispense Auth. Provider   cephALEXin (KEFLEX) 500 MG capsule Take 1 capsule (500 mg total) by mouth 2 (two) times daily for 5 days. 10 capsule Talbot Grumbling, FNP      PDMP not reviewed this encounter.   Talbot Grumbling,  08/03/22 2204

## 2022-08-01 NOTE — Discharge Instructions (Addendum)
Take Keflex '500mg'$  every 12 hours for the next 5 days to prevent infection to the site. Keep the site clean and dry.  When you shower, let water run over the wound. No not scratch the wound. Change the dressing twice daily with nonstick gauze and tape. Call your primary care provider on Monday to schedule an appointment to make sure that the wound is healing appropriately.  If you develop any new or worsening symptoms or do not improve in the next 2 to 3 days, please return.  If your symptoms are severe, please go to the emergency room.  Follow-up with your primary care provider for further evaluation and management of your symptoms as well as ongoing wellness visits.  I hope you feel better!

## 2022-08-05 ENCOUNTER — Emergency Department (HOSPITAL_COMMUNITY): Payer: Medicare HMO

## 2022-08-05 ENCOUNTER — Other Ambulatory Visit: Payer: Self-pay

## 2022-08-05 ENCOUNTER — Inpatient Hospital Stay (HOSPITAL_COMMUNITY)
Admission: EM | Admit: 2022-08-05 | Discharge: 2022-08-08 | DRG: 243 | Disposition: A | Discharge status: Home Health | Payer: Medicare HMO | Attending: Cardiology | Admitting: Cardiology

## 2022-08-05 DIAGNOSIS — W19XXXA Unspecified fall, initial encounter: Secondary | ICD-10-CM | POA: Diagnosis present

## 2022-08-05 DIAGNOSIS — I495 Sick sinus syndrome: Secondary | ICD-10-CM | POA: Diagnosis present

## 2022-08-05 DIAGNOSIS — K219 Gastro-esophageal reflux disease without esophagitis: Secondary | ICD-10-CM | POA: Diagnosis present

## 2022-08-05 DIAGNOSIS — J4489 Other specified chronic obstructive pulmonary disease: Secondary | ICD-10-CM | POA: Diagnosis present

## 2022-08-05 DIAGNOSIS — E039 Hypothyroidism, unspecified: Secondary | ICD-10-CM | POA: Diagnosis present

## 2022-08-05 DIAGNOSIS — I959 Hypotension, unspecified: Secondary | ICD-10-CM | POA: Diagnosis not present

## 2022-08-05 DIAGNOSIS — D649 Anemia, unspecified: Secondary | ICD-10-CM | POA: Diagnosis present

## 2022-08-05 DIAGNOSIS — Z880 Allergy status to penicillin: Secondary | ICD-10-CM

## 2022-08-05 DIAGNOSIS — R000 Tachycardia, unspecified: Secondary | ICD-10-CM | POA: Diagnosis not present

## 2022-08-05 DIAGNOSIS — I442 Atrioventricular block, complete: Secondary | ICD-10-CM | POA: Diagnosis not present

## 2022-08-05 DIAGNOSIS — M542 Cervicalgia: Secondary | ICD-10-CM | POA: Diagnosis present

## 2022-08-05 DIAGNOSIS — R001 Bradycardia, unspecified: Secondary | ICD-10-CM | POA: Diagnosis not present

## 2022-08-05 DIAGNOSIS — Z8249 Family history of ischemic heart disease and other diseases of the circulatory system: Secondary | ICD-10-CM

## 2022-08-05 DIAGNOSIS — Z7901 Long term (current) use of anticoagulants: Secondary | ICD-10-CM

## 2022-08-05 DIAGNOSIS — Z888 Allergy status to other drugs, medicaments and biological substances status: Secondary | ICD-10-CM

## 2022-08-05 DIAGNOSIS — S199XXA Unspecified injury of neck, initial encounter: Secondary | ICD-10-CM | POA: Diagnosis not present

## 2022-08-05 DIAGNOSIS — Z881 Allergy status to other antibiotic agents status: Secondary | ICD-10-CM

## 2022-08-05 DIAGNOSIS — E7800 Pure hypercholesterolemia, unspecified: Secondary | ICD-10-CM | POA: Diagnosis present

## 2022-08-05 DIAGNOSIS — R55 Syncope and collapse: Secondary | ICD-10-CM | POA: Diagnosis not present

## 2022-08-05 DIAGNOSIS — Z808 Family history of malignant neoplasm of other organs or systems: Secondary | ICD-10-CM

## 2022-08-05 DIAGNOSIS — D696 Thrombocytopenia, unspecified: Secondary | ICD-10-CM | POA: Diagnosis present

## 2022-08-05 DIAGNOSIS — Y92009 Unspecified place in unspecified non-institutional (private) residence as the place of occurrence of the external cause: Secondary | ICD-10-CM

## 2022-08-05 DIAGNOSIS — R519 Headache, unspecified: Secondary | ICD-10-CM | POA: Diagnosis present

## 2022-08-05 DIAGNOSIS — S098XXA Other specified injuries of head, initial encounter: Secondary | ICD-10-CM | POA: Diagnosis present

## 2022-08-05 DIAGNOSIS — I480 Paroxysmal atrial fibrillation: Secondary | ICD-10-CM | POA: Diagnosis present

## 2022-08-05 DIAGNOSIS — R42 Dizziness and giddiness: Secondary | ICD-10-CM | POA: Diagnosis not present

## 2022-08-05 DIAGNOSIS — I5032 Chronic diastolic (congestive) heart failure: Secondary | ICD-10-CM | POA: Diagnosis present

## 2022-08-05 DIAGNOSIS — Z79899 Other long term (current) drug therapy: Secondary | ICD-10-CM

## 2022-08-05 DIAGNOSIS — Z7983 Long term (current) use of bisphosphonates: Secondary | ICD-10-CM

## 2022-08-05 DIAGNOSIS — Z79890 Hormone replacement therapy (postmenopausal): Secondary | ICD-10-CM

## 2022-08-05 DIAGNOSIS — I059 Rheumatic mitral valve disease, unspecified: Secondary | ICD-10-CM | POA: Diagnosis present

## 2022-08-05 DIAGNOSIS — I110 Hypertensive heart disease with heart failure: Secondary | ICD-10-CM | POA: Diagnosis present

## 2022-08-05 DIAGNOSIS — Z9981 Dependence on supplemental oxygen: Secondary | ICD-10-CM

## 2022-08-05 LAB — CBC WITH DIFFERENTIAL/PLATELET
Abs Immature Granulocytes: 0.04 10*3/uL (ref 0.00–0.07)
Basophils Absolute: 0 10*3/uL (ref 0.0–0.1)
Basophils Relative: 0 %
Eosinophils Absolute: 0.1 10*3/uL (ref 0.0–0.5)
Eosinophils Relative: 1 %
HCT: 37 % (ref 36.0–46.0)
Hemoglobin: 12 g/dL (ref 12.0–15.0)
Immature Granulocytes: 0 %
Lymphocytes Relative: 21 %
Lymphs Abs: 2 10*3/uL (ref 0.7–4.0)
MCH: 30.9 pg (ref 26.0–34.0)
MCHC: 32.4 g/dL (ref 30.0–36.0)
MCV: 95.4 fL (ref 80.0–100.0)
Monocytes Absolute: 1 10*3/uL (ref 0.1–1.0)
Monocytes Relative: 10 %
Neutro Abs: 6.5 10*3/uL (ref 1.7–7.7)
Neutrophils Relative %: 68 %
Platelets: 214 10*3/uL (ref 150–400)
RBC: 3.88 MIL/uL (ref 3.87–5.11)
RDW: 13.9 % (ref 11.5–15.5)
WBC: 9.7 10*3/uL (ref 4.0–10.5)
nRBC: 0 % (ref 0.0–0.2)

## 2022-08-05 LAB — TROPONIN I (HIGH SENSITIVITY)
Troponin I (High Sensitivity): 8 ng/L (ref <18)
Troponin I (High Sensitivity): 9 ng/L (ref <18)

## 2022-08-05 LAB — I-STAT CHEM 8, ED
BUN: 19 mg/dL (ref 8–23)
Calcium, Ion: 1.2 mmol/L (ref 1.15–1.40)
Chloride: 104 mmol/L (ref 98–111)
Creatinine, Ser: 1.1 mg/dL — ABNORMAL HIGH (ref 0.44–1.00)
Glucose, Bld: 184 mg/dL — ABNORMAL HIGH (ref 70–99)
HCT: 36 % (ref 36.0–46.0)
Hemoglobin: 12.2 g/dL (ref 12.0–15.0)
Potassium: 4.4 mmol/L (ref 3.5–5.1)
Sodium: 138 mmol/L (ref 135–145)
TCO2: 23 mmol/L (ref 22–32)

## 2022-08-05 LAB — COMPREHENSIVE METABOLIC PANEL
ALT: 19 U/L (ref 0–44)
AST: 29 U/L (ref 15–41)
Albumin: 3.1 g/dL — ABNORMAL LOW (ref 3.5–5.0)
Alkaline Phosphatase: 62 U/L (ref 38–126)
Anion gap: 11 (ref 5–15)
BUN: 19 mg/dL (ref 8–23)
CO2: 21 mmol/L — ABNORMAL LOW (ref 22–32)
Calcium: 9.1 mg/dL (ref 8.9–10.3)
Chloride: 105 mmol/L (ref 98–111)
Creatinine, Ser: 1.06 mg/dL — ABNORMAL HIGH (ref 0.44–1.00)
GFR, Estimated: 50 mL/min — ABNORMAL LOW (ref >60)
Glucose, Bld: 185 mg/dL — ABNORMAL HIGH (ref 70–99)
Potassium: 4.3 mmol/L (ref 3.5–5.1)
Sodium: 137 mmol/L (ref 135–145)
Total Bilirubin: 0.6 mg/dL (ref 0.3–1.2)
Total Protein: 6.1 g/dL — ABNORMAL LOW (ref 6.5–8.1)

## 2022-08-05 LAB — MAGNESIUM: Magnesium: 2.2 mg/dL (ref 1.7–2.4)

## 2022-08-05 LAB — TSH: TSH: 0.75 u[IU]/mL (ref 0.350–4.500)

## 2022-08-05 MED ORDER — FUROSEMIDE 20 MG PO TABS
20.0000 mg | ORAL_TABLET | Freq: Every day | ORAL | Status: DC
  Filled 2022-08-05: qty 1

## 2022-08-05 MED ORDER — ONDANSETRON HCL 4 MG/2ML IJ SOLN: 4.0000 mg | Freq: Four times a day (QID) | INTRAMUSCULAR | Status: DC | PRN

## 2022-08-05 MED ORDER — LORATADINE 10 MG PO TABS
10.0000 mg | ORAL_TABLET | Freq: Every morning | ORAL | Status: DC
  Administered 2022-08-06 – 2022-08-08 (×2): 10 mg via ORAL
  Filled 2022-08-05 (×2): qty 1

## 2022-08-05 MED ORDER — CEPHALEXIN 250 MG PO CAPS
500.0000 mg | ORAL_CAPSULE | Freq: Once | ORAL | Status: AC
  Administered 2022-08-05: 500 mg via ORAL
  Filled 2022-08-05: qty 2

## 2022-08-05 MED ORDER — PANTOPRAZOLE SODIUM 40 MG PO TBEC
40.0000 mg | DELAYED_RELEASE_TABLET | Freq: Every day | ORAL | Status: DC
  Administered 2022-08-05 – 2022-08-08 (×3): 40 mg via ORAL
  Filled 2022-08-05 (×3): qty 1

## 2022-08-05 MED ORDER — MONTELUKAST SODIUM 10 MG PO TABS
10.0000 mg | ORAL_TABLET | Freq: Every day | ORAL | Status: DC
  Administered 2022-08-05 – 2022-08-07 (×3): 10 mg via ORAL
  Filled 2022-08-05 (×3): qty 1

## 2022-08-05 MED ORDER — ACETAMINOPHEN 325 MG PO TABS
650.0000 mg | ORAL_TABLET | ORAL | Status: DC | PRN
  Administered 2022-08-07: 650 mg via ORAL
  Filled 2022-08-05 (×2): qty 2

## 2022-08-05 MED ORDER — PRAVASTATIN SODIUM 10 MG PO TABS
20.0000 mg | ORAL_TABLET | Freq: Every day | ORAL | Status: DC
  Administered 2022-08-05 – 2022-08-06 (×2): 20 mg via ORAL
  Filled 2022-08-05 (×2): qty 2

## 2022-08-05 MED ORDER — METOPROLOL SUCCINATE ER 25 MG PO TB24
12.5000 mg | ORAL_TABLET | Freq: Every day | ORAL | Status: DC
  Administered 2022-08-05 – 2022-08-07 (×3): 12.5 mg via ORAL
  Filled 2022-08-05 (×4): qty 1

## 2022-08-05 MED ORDER — FLUOXETINE HCL 10 MG PO CAPS: 10.0000 mg | ORAL_CAPSULE | Freq: Every day | ORAL | Status: DC

## 2022-08-05 MED ORDER — APIXABAN 5 MG PO TABS
5.0000 mg | ORAL_TABLET | Freq: Two times a day (BID) | ORAL | Status: DC
  Administered 2022-08-05 – 2022-08-06 (×2): 5 mg via ORAL
  Filled 2022-08-05 (×2): qty 1

## 2022-08-05 MED ORDER — LEVOTHYROXINE SODIUM 25 MCG PO TABS
125.0000 ug | ORAL_TABLET | Freq: Every day | ORAL | Status: DC
  Administered 2022-08-06 – 2022-08-08 (×2): 125 ug via ORAL
  Filled 2022-08-05 (×2): qty 1

## 2022-08-05 NOTE — ED Triage Notes (Addendum)
PT BIB [facility] for a fall on Eliquis.  PT became dizzy and fell back on to her back. EMS was not aware of her hitting her head but she stated that she did once she arrived.  No obvious head trauma on scene but pt complains of HA and some eye pain behind her eyes.   PT was found to be in complete Heart block at a rate of 35 for EMS.   BP 120/65 HR 35

## 2022-08-05 NOTE — ED Provider Notes (Signed)
[facility] Provider Note   CSN: [id number] Arrival date & time: 08/05/22  1511     History  No chief complaint on file.   Samantha [name] is a 89 y.o. female.  HPI 89 year old female with a history of A-fib who is on Eliquis and metoprolol presents after a fall.  She was taking the trash out and when she went to open the door she felt acutely dizzy and fell backwards.  Struck the back of her body and she thinks she did hit her head and has a mild headache.  Is also having some neck pain.  She denies chest pain or shortness of breath or current dizziness.  She is on metoprolol but has not taken any extra doses or feel sick.  Her cardiologist is Dr. Johnsie Cancel.  Home Medications Prior to Admission medications   Medication Sig Start Date End Date Taking? Authorizing Provider  albuterol (VENTOLIN HFA) 108 (90 Base) MCG/ACT inhaler Inhale 1-2 puffs into the lungs every 6 (six) hours as needed for wheezing or shortness of breath. 08/01/20  Yes Collene Gobble, MD  apixaban (ELIQUIS) 5 MG TABS tablet Take 5 mg by mouth in the morning and at bedtime.   Yes [provider]  Calcium-Magnesium-Zinc (CALCIUM-MAGNESUIUM-ZINC PO) Take 1 tablet by mouth every Monday, Wednesday, and Friday.   Yes [provider]  cephALEXin (KEFLEX) 500 MG capsule Take 1 capsule (500 mg total) by mouth 2 (two) times daily for 5 days. 08/01/22 08/06/22 Yes StanhopeStasia Cavalier, FNP  Cholecalciferol (VITAMIN D3) 50 MCG (2000 UT) CAPS Take 2,000 Units by mouth daily.   Yes [provider]  cyanocobalamin (VITAMIN B12) 1000 MCG tablet Take 1,000 mcg by mouth every Monday, Wednesday, and Friday.   Yes [provider]  denosumab (PROLIA) 60 MG/ML SOSY injection Inject 60 mg into the skin every 6 (six) months.   Yes [provider]  furosemide (LASIX) 20 MG tablet Take 20 mg by mouth See admin instructions. 20 mg once daily between 1600-1800.   Yes  [provider]  levothyroxine (SYNTHROID) 125 MCG tablet Take 125 mcg by mouth daily before breakfast. 08/17/20  Yes [provider]  loratadine (CLARITIN) 10 MG tablet Take 10 mg by mouth daily.   Yes [provider]  lovastatin (MEVACOR) 20 MG tablet Take 20 mg by mouth at bedtime.   Yes [provider]  Lubricants (LUBRICATING JELLY) GEL Place 1 application  vaginally daily.   Yes [provider]  metoprolol succinate (TOPROL-XL) 50 MG 24 hr tablet Take 50 mg by mouth at bedtime.   Yes [provider]  montelukast (SINGULAIR) 10 MG tablet Take 10 mg by mouth at bedtime.   Yes [provider]  Multiple Vitamins-Minerals ([employer] ADULT 50+ EYE HEALTH) CAPS Take 1 capsule by mouth daily.   Yes [provider]  mupirocin ointment (BACTROBAN) 2 % Apply 1 application. topically 2 (two) times daily. Apply to area on foot. Patient taking differently: Apply 1 application  topically 2 (two) times daily. To each nostril 11/05/21  Yes Nyoka Lint, PA-C  Omega-3 Fatty Acids (FISH OIL) 1000 MG CAPS Take 1,000 mg by mouth daily after breakfast.   Yes [provider]  omeprazole (PRILOSEC) 40 MG capsule Take 40 mg by mouth at bedtime.   Yes [provider]  polyethylene glycol (MIRALAX / GLYCOLAX) packet Take 8.5 g by mouth every other day.   Yes [provider]  potassium chloride (K-DUR) 10 MEQ tablet Take 1 tablet (10 mEq total) by mouth daily. Patient taking differently: Take 10 mEq by mouth See admin instructions. 10 mEq once daily between 1600-1800. 10/24/15  Yes Josue Hector, MD  sodium chloride (OCEAN) 0.65 % SOLN nasal spray Place 1 spray into both nostrils 3 (three) times daily as needed (nasal dryness).   Yes [provider]      Allergies    Erythromycin, Vibra-tab [doxycycline], Benzalkonium chloride, Erythromycin, Fosamax [alendronate], Neo-bacit-poly-lidocaine, Neosporin [neomycin-bacitracin  zn-polymyx], Penicillins, and Zithromax [azithromycin]    Review of Systems   Review of Systems  Respiratory:  Negative for shortness of breath.   Cardiovascular:  Negative for chest pain.  Musculoskeletal:  Positive for neck pain.  Neurological:  Positive for light-headedness and headaches. Negative for syncope.    Physical Exam Updated Vital Signs BP (!) 151/58 (BP Location: Right Arm)   Pulse 63   Temp 97.6 F (36.4 C) (Oral)   Resp 18   Ht '5\' 1"'$  (1.549 m)   Wt 65.7 kg   SpO2 91%   BMI 27.37 kg/m  Physical Exam Vitals and nursing note reviewed.  Constitutional:      General: She is not in acute distress.    Appearance: She is well-developed. She is not ill-appearing or diaphoretic.     Interventions: Cervical collar in place.  HENT:     Head: Normocephalic.   Neck:   Cardiovascular:     Rate and Rhythm: Regular rhythm. Bradycardia present.     Pulses:          Dorsalis pedis pulses are 2+ on the left side.     Heart sounds: Normal heart sounds.  Pulmonary:     Effort: Pulmonary effort is normal.     Breath sounds: Normal breath sounds.  Abdominal:     Palpations: Abdomen is soft.     Tenderness: There is no abdominal tenderness.  Musculoskeletal:     Cervical back: Muscular tenderness present.  Skin:    General: Skin is warm and dry.  Neurological:     Mental Status: She is alert.     Comments: Equal strength in all 4 extremities.  No slurred speech.     ED Results / Procedures / Treatments   Labs (all labs ordered are listed, but only abnormal results are displayed) Labs Reviewed  COMPREHENSIVE METABOLIC PANEL - Abnormal; Notable for the following components:      Result Value   CO2 21 (*)    Glucose, Bld 185 (*)    Creatinine, Ser 1.06 (*)    Total Protein 6.1 (*)    Albumin 3.1 (*)    GFR, Estimated 50 (*)    All other components within normal limits  I-STAT CHEM 8, ED - Abnormal; Notable for the following components:   Creatinine, Ser 1.10  (*)    Glucose, Bld 184 (*)    All other components within normal limits  CBC WITH DIFFERENTIAL/PLATELET  MAGNESIUM  TSH  CBC  BASIC METABOLIC PANEL  TROPONIN I (HIGH SENSITIVITY)  TROPONIN I (HIGH SENSITIVITY)    EKG EKG Interpretation  Date/Time:  Tuesday August 05 2022 15:24:44 EST Ventricular Rate:  32 PR Interval:    QRS Duration: 130 QT Interval:  619 QTC Calculation: 452 R Axis:   83 Text Interpretation: AV block, complete (third degree) IVCD, consider atypical RBBB new since May 2023 Confirmed by Sherwood Gambler [id number]) on 08/05/2022 3:31:58 PM  Radiology CT Head Wo Contrast  Result Date: 08/05/2022 CLINICAL DATA:  Neck trauma EXAM: CT HEAD WITHOUT CONTRAST CT CERVICAL SPINE WITHOUT CONTRAST TECHNIQUE: Multidetector CT imaging of the head and cervical spine was performed following the standard protocol without intravenous contrast. Multiplanar CT image reconstructions of the cervical spine were also generated. RADIATION DOSE REDUCTION: This exam was performed according to the departmental dose-optimization program which includes automated exposure control, adjustment of the mA and/or kV according to patient size and/or use of iterative reconstruction technique. COMPARISON:  CT C Spine 03/30/22, CT Head 03/30/22 FINDINGS: CT HEAD FINDINGS Brain: No evidence of acute infarction, hemorrhage, hydrocephalus, extra-axial collection or mass lesion/mass effect. Vascular: No hyperdense vessel or unexpected calcification. Skull: Normal. Negative for fracture or focal lesion. Sinuses/Orbits: No mastoid or middle ear effusion. Paranasal sinuses are clear. Bilateral lens replacement. Orbits are otherwise unremarkable. Mild left maxillary sinus mucosal thickening. Other: None. CT CERVICAL SPINE FINDINGS Alignment: Normal. Skull base and vertebrae: No acute fracture. No primary bone lesion or focal pathologic process. Soft tissues and spinal canal: No prevertebral fluid or swelling. No visible canal  hematoma. Disc levels:  No evidence of high-grade spinal canal stenosis. Upper chest: Right apical pleural-parenchymal scarring. Other: Lobulated soft tissue lesion in the left parotid gland (series 5, image 33) measuring 1.4 x 0.9 cm likely represents an intraparotid lymph node. This is unchanged from prior IMPRESSION: 1. No acute intracranial abnormality. 2. No acute fracture or traumatic listhesis in the cervical spine. Electronically Signed   By: Marin Roberts M.D.   On: 08/05/2022 16:13   CT Cervical Spine Wo Contrast  Result Date: 08/05/2022 CLINICAL DATA:  Neck trauma EXAM: CT HEAD WITHOUT CONTRAST CT CERVICAL SPINE WITHOUT CONTRAST TECHNIQUE: Multidetector CT imaging of the head and cervical spine was performed following the standard protocol without intravenous contrast. Multiplanar CT image reconstructions of the cervical spine were also generated. RADIATION DOSE REDUCTION: This exam was performed according to the departmental dose-optimization program which includes automated exposure control, adjustment of the mA and/or kV according to patient size and/or use of iterative reconstruction technique. COMPARISON:  CT C Spine 03/30/22, CT Head 03/30/22 FINDINGS: CT HEAD FINDINGS Brain: No evidence of acute infarction, hemorrhage, hydrocephalus, extra-axial collection or mass lesion/mass effect. Vascular: No hyperdense vessel or unexpected calcification. Skull: Normal. Negative for fracture or focal lesion. Sinuses/Orbits: No mastoid or middle ear effusion. Paranasal sinuses are clear. Bilateral lens replacement. Orbits are otherwise unremarkable. Mild left maxillary sinus mucosal thickening. Other: None. CT CERVICAL SPINE FINDINGS Alignment: Normal. Skull base and vertebrae: No acute fracture. No primary bone lesion or focal pathologic process. Soft tissues and spinal canal: No prevertebral fluid or swelling. No visible canal hematoma. Disc levels:  No evidence of high-grade spinal canal stenosis. Upper  chest: Right apical pleural-parenchymal scarring. Other: Lobulated soft tissue lesion in the left parotid gland (series 5, image 33) measuring 1.4 x 0.9 cm likely represents an intraparotid lymph node. This is unchanged from prior IMPRESSION: 1. No acute intracranial abnormality. 2. No acute fracture or traumatic listhesis in the cervical spine. Electronically Signed   By: Marin Roberts M.D.   On: 08/05/2022 16:13   DG Chest Portable 1 View  Result Date: 08/05/2022 CLINICAL DATA:  Syncope EXAM: PORTABLE CHEST 1 VIEW COMPARISON:  10/01/2021 FINDINGS: Mild cardiomegaly. Aortic atherosclerosis. No focal airspace consolidation, pleural effusion, or pneumothorax. IMPRESSION: No active disease. Electronically Signed   By: Davina Poke D.O.   On: 08/05/2022 15:53    Procedures .Critical Care  Performed by: Sherwood Gambler, MD  Authorized by: Sherwood Gambler, MD   Critical care provider statement:    Critical care time (minutes):  30   Critical care time was exclusive of:  Separately billable procedures and treating other patients   Critical care was necessary to treat or prevent imminent or life-threatening deterioration of the following conditions:  Cardiac failure   Critical care was time spent personally by me on the following activities:  Development of treatment plan with patient or surrogate, discussions with consultants, evaluation of patient's response to treatment, examination of patient, ordering and review of laboratory studies, ordering and review of radiographic studies, ordering and performing treatments and interventions, pulse oximetry, re-evaluation of patient's condition and review of old charts     Medications Ordered in ED Medications  acetaminophen (TYLENOL) tablet 650 mg (has no administration in time range)  ondansetron (ZOFRAN) injection 4 mg (has no administration in time range)  apixaban (ELIQUIS) tablet 5 mg (5 mg Oral Given 08/05/22 2047)  metoprolol succinate (TOPROL-XL)  24 hr tablet 12.5 mg (12.5 mg Oral Given 08/05/22 2046)  furosemide (LASIX) tablet 20 mg (has no administration in time range)  levothyroxine (SYNTHROID) tablet 125 mcg (has no administration in time range)  loratadine (CLARITIN) tablet 10 mg (has no administration in time range)  pravastatin (PRAVACHOL) tablet 20 mg (20 mg Oral Given 08/05/22 2048)  montelukast (SINGULAIR) tablet 10 mg (10 mg Oral Given 08/05/22 2047)  pantoprazole (PROTONIX) EC tablet 40 mg (40 mg Oral Given 08/05/22 2047)  cephALEXin (KEFLEX) capsule 500 mg (500 mg Oral Given 08/05/22 2048)    ED Course/ Medical Decision Making/ A&P                             Medical Decision Making Amount and/or Complexity of Data Reviewed Labs: ordered.    Details: No significant electrolyte disturbance. Radiology: ordered and independent interpretation performed.    Details: No head bleed or C-spine fracture. ECG/medicine tests: ordered and independent interpretation performed.    Details: Complete heart block  Risk Decision regarding hospitalization.   Patient found to have a complete heart block which was likely the cause of her falling and dizziness today.  She had syncope back in October but no clear diagnosis at that time.  Probably a similar issue.  Cardiology has seen and will end up admitting.  Will continue to hold her metoprolol right now she is not hypotensive.  She actually went back to a sinus rhythm while in the emergency department.  Cardiology to admit.        Final Clinical Impression(s) / ED Diagnoses Final diagnoses:  Complete heart block [facility])    Rx / DC Orders ED Discharge Orders     None         Sherwood Gambler, MD 08/05/22 2332

## 2022-08-05 NOTE — Progress Notes (Signed)
Orthopedic Tech Progress Note Patient Details:  Samantha [name] 04/18/1930 [id number] Level 2 Trauma. Not needed Patient ID: Samantha [name], female   DOB: [birth year], 89 y.o.   MRN: [id number]  [name] 08/05/2022, 5:51 PM

## 2022-08-05 NOTE — H&P (Addendum)
Cardiology Consultation   Patient ID: Samantha [name] MRN: [id number]; DOB: 1930-04-24  Admit date: 08/05/2022 Date of Consult: 08/05/2022  PCP:  Samantha Ferretti, MD   [place] Prov[facility] Cardiologist:  Samantha Rouge, MD        Patient Profile:   Samantha [name] is a 89 y.o. female with a hx of hx of PAF, LBBB, ventricular ectopy, hypertension, mild MR, chronic diastolic CHF, DVT, and hypothyroidism  who is being seen 08/05/2022 for the evaluation of complete heart block and fall at the request of Dr. Regenia [name].  History of Present Illness:   Samantha [name] presented to the emergency department via [facility] this afternoon after a fall at home due to significant near syncopal episode.  Patient says that she had just completed a meal at home and was going to take the trash can out to the street.  She says that as she reached for her door, she suddenly had "a gray cloud come over my vision and I was suddenly falling backwards."  Patient did hit the back of her head on the floor, but does not believe that she fully lost consciousness.  Patient activated her medical ID necklace.  She denies any presyncopal symptoms such as palpitations, dizziness/lightheadedness.  Upon EMS arrival, patient was found to have complete heart block, with heart rate in the mid 30s.  She was transported to the [facility] emergency department for further management.  Today patient recalls a very similar episode at the end of October when without warning, patient did completely lose consciousness and fall backwards.  This was a witnessed syncopal episode at a [place] party.  Again at that time, patient denies any prodromal symptoms.  Following this episode, patient wore a 14-day heart monitor that did not detect any arrhythmias to explain syncopal episode.  Since October, patient has felt very well, no recent illness or changes to exertional tolerance. Patient continues to live independently and still drives.  She  endorses compliance with all medicines including Eliquis, low-dose Lasix, metoprolol.   Past Medical History:  Diagnosis Date   Arthritis    Asthma    GERD (gastroesophageal reflux disease)    High cholesterol    Hypothyroidism    LBBB (left bundle branch block)    Ventricular ectopy     Past Surgical History:  Procedure Laterality Date   CARDIOVERSION N/A 11/27/2015   Procedure: CARDIOVERSION;  Surgeon: Samantha Hector, MD;  Location: [place];  Service: Cardiovascular;  Laterality: N/A;   KNEE ARTHROSCOPY     TIBIA IM NAIL INSERTION Right 04/05/2018   Procedure: INTRAMEDULLARY (IM) NAIL TIBIAL;  Surgeon: Nicholes Stairs, MD;  Location: [place];  Service: Orthopedics;  Laterality: Right;     Home Medications:  Prior to Admission medications   Medication Sig Start Date End Date Taking? Authorizing Provider  albuterol (VENTOLIN HFA) 108 (90 Base) MCG/ACT inhaler Inhale 1-2 puffs into the lungs every 6 (six) hours as needed for wheezing or shortness of breath. 08/01/20   Samantha Gobble, MD  apixaban (ELIQUIS) 5 MG TABS tablet Take 5 mg by mouth in the morning and at bedtime.    [provider]  Calcium-Magnesium-Zinc (CALCIUM-MAGNESUIUM-ZINC PO) Take 1 tablet by mouth every Monday, Wednesday, and Friday.    [provider]  cephALEXin (KEFLEX) 500 MG capsule Take 1 capsule (500 mg total) by mouth 2 (two) times daily for 5 days. 08/01/22 08/06/22  Samantha Grumbling, FNP  Cholecalciferol (VITAMIN D3)  50 MCG (2000 UT) TABS Take 2,000 Units by mouth in the morning.    [provider]  denosumab (PROLIA) 60 MG/ML SOSY injection Inject 60 mg into the skin every 6 (six) months.    [provider]  FLUoxetine (PROZAC) 10 MG capsule Take 10 mg by mouth daily. 01/09/22   [provider]  furosemide (LASIX) 20 MG tablet Take 20 mg by mouth daily.    [provider]  hydrocortisone cream 1 % Apply 1 application. topically daily as needed  for itching (affected areas).    [provider]  levothyroxine (SYNTHROID) 125 MCG tablet Take 125 mcg by mouth daily before breakfast. 08/17/20   [provider]  loratadine (CLARITIN) 10 MG tablet Take 10 mg by mouth in the morning.    [provider]  lovastatin (MEVACOR) 20 MG tablet Take 20 mg by mouth at bedtime.    [provider]  Lubricants (LUBRICATING JELLY) GEL Place 1 application. vaginally in the morning and at bedtime.    [provider]  metoprolol succinate (TOPROL-XL) 50 MG 24 hr tablet Take 50 mg by mouth at bedtime. Take with or immediately following a meal.    [provider]  montelukast (SINGULAIR) 10 MG tablet Take 10 mg by mouth at bedtime.    [provider]  Multiple Vitamins-Minerals (PRESERVISION AREDS PO) Take 1 capsule by mouth 2 (two) times daily.    [provider]  mupirocin ointment (BACTROBAN) 2 % Apply 1 application. topically 2 (two) times daily. Apply to area on foot. 11/05/21   Samantha Lint, PA-C  Omega-3 Fatty Acids (FISH OIL) 1000 MG CAPS Take 1,000 mg by mouth 3 (three) times daily.    [provider]  omeprazole (PRILOSEC) 40 MG capsule Take 40 mg by mouth at bedtime.    [provider]  polyethylene glycol (MIRALAX / GLYCOLAX) packet Take 8.5 g by mouth every other day.    [provider]  potassium chloride (K-DUR) 10 MEQ tablet Take 1 tablet (10 mEq total) by mouth daily. 10/24/15   Samantha Hector, MD  sodium chloride (OCEAN) 0.65 % SOLN nasal spray Place 1 spray into both nostrils as needed for congestion.    [provider]  vitamin B-12 (CYANOCOBALAMIN) 100 MCG tablet Take 100 mcg by mouth every Monday, Wednesday, and Friday.    [provider]    Inpatient Medications: Scheduled Meds:  Continuous Infusions:  PRN Meds:   Allergies:    Allergies  Allergen Reactions   Erythromycin Diarrhea and Rash   Vibra-Tab [Doxycycline]  Hives   Benzalkonium Chloride Rash   Erythromycin Rash   Fosamax [Alendronate] Nausea Only and Other (See Comments)    Severe stomach cramps   Neo-Bacit-Poly-Lidocaine Rash   Neosporin [Neomycin-Bacitracin Zn-Polymyx] Rash   Penicillins Rash    DID THE REACTION INVOLVE: Swelling of the face/tongue/throat, SOB, or low BP? N Sudden or severe rash/hives, skin peeling, or the inside of the mouth or nose? Y Did it require medical treatment? N When did it last happen?      Childhood If all above answers are "NO", may proceed with cephalosporin use.   Zithromax [Azithromycin] Diarrhea    Social History:   Social History   Socioeconomic History   Marital status: Widowed    Spouse name: Not on file   Number of children: Not on file   Years of education: Not on file   Highest education level: Not on file  Occupational  History   Not on file  Tobacco Use   Smoking status: Never    Passive exposure: Yes   Smokeless tobacco: Never  Vaping Use   Vaping Use: Never used  Substance and Sexual Activity   Alcohol use: No    Alcohol/week: 0.0 standard drinks of alcohol   Drug use: No   Sexual activity: Not Currently  Other Topics Concern   Not on file  Social History Narrative   ** Merged History Encounter **       Social Determinants of Health   Financial Resource Strain: Not on file  Food Insecurity: Not on file  Transportation Needs: Not on file  Physical Activity: Not on file  Stress: Not on file  Social Connections: Not on file  Intimate Partner Violence: Not on file    Family History:    Family History  Problem Relation Age of Onset   Heart attack Mother    Throat cancer Father    Congestive Heart Failure Sister    Congestive Heart Failure Brother      ROS:  Please see the history of present illness.   All other ROS reviewed and negative.     Physical Exam/Data:   Vitals:   08/05/22 1515 08/05/22 1535 08/05/22 1630  BP: (!) 111/52 (!) 118/42 (!) 143/60   Pulse: (!) 33  72  Resp: (!) 26  (!) 23  Temp:  98.4 F (36.9 C)   TempSrc: Oral Oral   SpO2: 98%  96%   No intake or output data in the 24 hours ending 08/05/22 1732    07/02/2022   10:31 AM 04/04/2022   11:06 AM 01/07/2022    1:32 PM  Last 3 Weights  Weight (lbs) 143 lb 142 lb 143 lb  Weight (kg) 64.864 kg 64.411 kg 64.864 kg     There is no height or weight on file to calculate BMI.  General:  Well nourished, well developed, in no acute distress HEENT: normal Neck: no JVD Vascular: No carotid bruits; Distal pulses 2+ bilaterally Cardiac:  normal S1, S2; RRR; no murmur  Lungs:  clear to auscultation bilaterally, no wheezing, rhonchi or rales  Abd: soft, nontender, no hepatomegaly  Ext: no edema Musculoskeletal:  No deformities, BUE and BLE strength normal and equal Skin: warm and dry  Neuro:  CNs 2-12 intact, no focal abnormalities noted Psych:  Normal affect   EKG:  The EKG was personally reviewed and demonstrates:  initial ECG with complete heart block, RBBB morphology escape beats. Current ECG with sinus rhythm first degree AV block with LBBB morphology (known). Telemetry:  Telemetry was personally reviewed and demonstrates:  Initially bradycardia with complete heart block, now sinus rhythm with first degree AV block morphology.  Relevant CV Studies:  07/24/22 TTE  IMPRESSIONS     1. Left ventricular ejection fraction, by estimation, is 50 to 55%. The  left ventricle has low normal function. The left ventricle has no regional  wall motion abnormalities. Left ventricular diastolic parameters were  normal.   2. Right ventricular systolic function is normal. The right ventricular  size is normal. Tricuspid regurgitation signal is inadequate for assessing  PA pressure.   3. The mitral valve is normal in structure. Moderate mitral valve  regurgitation. No evidence of mitral stenosis.   4. The aortic valve is tricuspid. Aortic valve regurgitation is not  visualized.  Aortic valve sclerosis is present, with no evidence of aortic  valve stenosis.   5. The inferior vena  cava is normal in size with greater than 50%  respiratory variability, suggesting right atrial pressure of 3 mmHg.   FINDINGS   Left Ventricle: Left ventricular ejection fraction, by estimation, is 50  to 55%. The left ventricle has low normal function. The left ventricle has  no regional wall motion abnormalities. The left ventricular internal  cavity size was normal in size.  There is no left ventricular hypertrophy. Left ventricular diastolic  parameters were normal.   Right Ventricle: The right ventricular size is normal. Right ventricular  systolic function is normal. Tricuspid regurgitation signal is inadequate  for assessing PA pressure. The tricuspid regurgitant velocity is 2.13 m/s,  and with an assumed right atrial   pressure of 3 mmHg, the estimated right ventricular systolic pressure is  123456 mmHg.   Left Atrium: Left atrial size was normal in size.   Right Atrium: Right atrial size was normal in size.   Pericardium: There is no evidence of pericardial effusion.   Mitral Valve: The mitral valve is normal in structure. Moderate mitral  valve regurgitation. No evidence of mitral valve stenosis.   Tricuspid Valve: The tricuspid valve is normal in structure. Tricuspid  valve regurgitation is trivial. No evidence of tricuspid stenosis.   Aortic Valve: The aortic valve is tricuspid. Aortic valve regurgitation is  not visualized. Aortic valve sclerosis is present, with no evidence of  aortic valve stenosis.   Pulmonic Valve: The pulmonic valve was normal in structure. Pulmonic valve  regurgitation is not visualized. No evidence of pulmonic stenosis.   Aorta: The aortic root is normal in size and structure.   Venous: The inferior vena cava is normal in size with greater than 50%  respiratory variability, suggesting right atrial pressure of 3 mmHg.   IAS/Shunts: No  atrial level shunt detected by color flow Doppler.    Laboratory Data:  High Sensitivity Troponin:   Recent Labs  Lab 08/05/22 1540  TROPONINIHS 8     Chemistry Recent Labs  Lab 08/05/22 1540 08/05/22 1543  NA 137 138  K 4.3 4.4  CL 105 104  CO2 21*  --   GLUCOSE 185* 184*  BUN 19 19  CREATININE 1.06* 1.10*  CALCIUM 9.1  --   MG 2.2  --   GFRNONAA 50*  --   ANIONGAP 11  --     Recent Labs  Lab 08/05/22 1540  PROT 6.1*  ALBUMIN 3.1*  AST 29  ALT 19  ALKPHOS 62  BILITOT 0.6   Lipids No results for input(s): "CHOL", "TRIG", "HDL", "LABVLDL", "LDLCALC", "CHOLHDL" in the last 168 hours.  Hematology Recent Labs  Lab 08/05/22 1540 08/05/22 1543  WBC 9.7  --   RBC 3.88  --   HGB 12.0 12.2  HCT 37.0 36.0  MCV 95.4  --   MCH 30.9  --   MCHC 32.4  --   RDW 13.9  --   PLT 214  --    Thyroid No results for input(s): "TSH", "FREET4" in the last 168 hours.  BNPNo results for input(s): "BNP", "PROBNP" in the last 168 hours.  DDimer No results for input(s): "DDIMER" in the last 168 hours.   Radiology/Studies:  CT Head Wo Contrast  Result Date: 08/05/2022 CLINICAL DATA:  Neck trauma EXAM: CT HEAD WITHOUT CONTRAST CT CERVICAL SPINE WITHOUT CONTRAST TECHNIQUE: Multidetector CT imaging of the head and cervical spine was performed following the standard protocol without intravenous contrast. Multiplanar CT image reconstructions of the cervical spine were also  generated. RADIATION DOSE REDUCTION: This exam was performed according to the departmental dose-optimization program which includes automated exposure control, adjustment of the mA and/or kV according to patient size and/or use of iterative reconstruction technique. COMPARISON:  CT C Spine 03/30/22, CT Head 03/30/22 FINDINGS: CT HEAD FINDINGS Brain: No evidence of acute infarction, hemorrhage, hydrocephalus, extra-axial collection or mass lesion/mass effect. Vascular: No hyperdense vessel or unexpected calcification.  Skull: Normal. Negative for fracture or focal lesion. Sinuses/Orbits: No mastoid or middle ear effusion. Paranasal sinuses are clear. Bilateral lens replacement. Orbits are otherwise unremarkable. Mild left maxillary sinus mucosal thickening. Other: None. CT CERVICAL SPINE FINDINGS Alignment: Normal. Skull base and vertebrae: No acute fracture. No primary bone lesion or focal pathologic process. Soft tissues and spinal canal: No prevertebral fluid or swelling. No visible canal hematoma. Disc levels:  No evidence of high-grade spinal canal stenosis. Upper chest: Right apical pleural-parenchymal scarring. Other: Lobulated soft tissue lesion in the left parotid gland (series 5, image 33) measuring 1.4 x 0.9 cm likely represents an intraparotid lymph node. This is unchanged from prior IMPRESSION: 1. No acute intracranial abnormality. 2. No acute fracture or traumatic listhesis in the cervical spine. Electronically Signed   By: Marin Roberts M.D.   On: 08/05/2022 16:13   CT Cervical Spine Wo Contrast  Result Date: 08/05/2022 CLINICAL DATA:  Neck trauma EXAM: CT HEAD WITHOUT CONTRAST CT CERVICAL SPINE WITHOUT CONTRAST TECHNIQUE: Multidetector CT imaging of the head and cervical spine was performed following the standard protocol without intravenous contrast. Multiplanar CT image reconstructions of the cervical spine were also generated. RADIATION DOSE REDUCTION: This exam was performed according to the departmental dose-optimization program which includes automated exposure control, adjustment of the mA and/or kV according to patient size and/or use of iterative reconstruction technique. COMPARISON:  CT C Spine 03/30/22, CT Head 03/30/22 FINDINGS: CT HEAD FINDINGS Brain: No evidence of acute infarction, hemorrhage, hydrocephalus, extra-axial collection or mass lesion/mass effect. Vascular: No hyperdense vessel or unexpected calcification. Skull: Normal. Negative for fracture or focal lesion. Sinuses/Orbits: No mastoid  or middle ear effusion. Paranasal sinuses are clear. Bilateral lens replacement. Orbits are otherwise unremarkable. Mild left maxillary sinus mucosal thickening. Other: None. CT CERVICAL SPINE FINDINGS Alignment: Normal. Skull base and vertebrae: No acute fracture. No primary bone lesion or focal pathologic process. Soft tissues and spinal canal: No prevertebral fluid or swelling. No visible canal hematoma. Disc levels:  No evidence of high-grade spinal canal stenosis. Upper chest: Right apical pleural-parenchymal scarring. Other: Lobulated soft tissue lesion in the left parotid gland (series 5, image 33) measuring 1.4 x 0.9 cm likely represents an intraparotid lymph node. This is unchanged from prior IMPRESSION: 1. No acute intracranial abnormality. 2. No acute fracture or traumatic listhesis in the cervical spine. Electronically Signed   By: Marin Roberts M.D.   On: 08/05/2022 16:13   DG Chest Portable 1 View  Result Date: 08/05/2022 CLINICAL DATA:  Syncope EXAM: PORTABLE CHEST 1 VIEW COMPARISON:  10/01/2021 FINDINGS: Mild cardiomegaly. Aortic atherosclerosis. No focal airspace consolidation, pleural effusion, or pneumothorax. IMPRESSION: No active disease. Electronically Signed   By: Davina Poke D.O.   On: 08/05/2022 15:53     Assessment and Plan:  Samantha [name] is a 89 y.o. female with a hx of hx of PAF, LBBB, ventricular ectopy, hypertension, mild MR, chronic diastolic CHF, DVT, and hypothyroidism  who is being seen 08/05/2022 for the evaluation of complete heart block and fall at the request of Dr. Regenia [name].   Complete  heart block Symptomatic bradycardia Syncope  Patient seen today after a fall secondary to near syncopal episode.  Patient with documented complete heart block by EMS as well as on telemetry and ECG at the emergency department.  This is now the second non-provocated syncopal/near syncopal episode that patient has experienced in the last 6 months. No arrhythmias noted with the  first fall but with today's episode, patient found in complete heart block. While seeing the patient in the ED, she had recovery of conduction and HR improved to the 70s with first degree AV block. Interestingly patient has a normal axis with RBBB morphology escape beats when in complete heart block. Baseline rhythm now is LBBB with first degree AV block.   Admit to cardiac tele unit for overnight observation. After discussion with Dr. Harriet Masson, will plan to trial reduced dose of Metoprolol tonight while patient can be monitored on telemetry. If she has recurrent complete heart block, would need to discontinue. Patient would really only be a pacemaker candidate if she continued to have complete heart block off all AV nodal blocking agents. Check TSH  Paroxysmal atrial fibrillation CHA2DS2-VASc Score = 4   Patient with history of paroxysmal atrial fibrillation. ~3% burden on most recent 14 day monitor. Sinus rhythm with first degree AV block currently on telemetry. Continue Eliquis.   Chronic diastolic CHF Peripheral edema  07/24/22 TTE with LVEF 50-55%. Patient clinically appears euvolemic, no peripheral edema. Continue low dose lasix '20mg'$ .   Hypothyroidism  Will check TSH. Continue home synthroid dosing at this time.  Hypertension  Patient mildly hypertensive on exam today. May need to make adjustments to anti-hypertensive regimen with decreased metoprolol as above.   Risk Assessment/Risk Scores:     New York Heart Association (NYHA) Functional Class NYHA Class I  CHA2DS2-VASc Score = 4   This indicates a 4.8% annual risk of stroke. The patient's score is based upon: CHF History: 0 HTN History: 1 Diabetes History: 0 Stroke History: 0 Vascular Disease History: 0 Age Score: 2 Gender Score: 1    For questions or updates, please contact [place] Please consult [email or web address] for contact info under  Signed, Berniece Salines, DO  08/05/2022 5:32 PM  Patient seen and  examined, note reviewed with the signed Advanced Practice Provider. I personally reviewed laboratory data, imaging studies and relevant notes. I independently examined the patient and formulated the important aspects of the plan. I have personally discussed the plan with the patient and/or family. Comments or changes to the note/plan are indicated below.  She had a syncope episode and was noted to be in incomplete heart block.  Unfortunately at this time the patient is on 3 mg of metoprolol succinate.  With her beta-blocker on board it would be beneficial to cut back on this and monitor her on telemetry.  With her age and conduction disease definitely sick sinus syndrome may be playing a role here but she is also on AV nodal blocker.  Will continue to monitor on telemetry with her drop dose prefer not to completely hold this high dose of Toprol  to avoid beta-blocker withdrawal. I will discuss with EP if there are any other recommendations. Continue her Eliquis but Will follow-up TSH as well.  Will admit to cardiology   DVT prophylaxis on Eliquis  Berniece Salines DO, MS [facility] Attending [place]  [address] [place], [place] [address] [phone] Website: [email or web address]

## 2022-08-05 NOTE — ED Notes (Signed)
Trauma Response Nurse Documentation   Samantha [name] is a 89 y.o. female arriving to [facility] ED via EMS  On Eliquis (apixaban) daily. Trauma was activated as a Level 2 by Cherylann Ratel, Samantha [name] and Dr. Jeanell Sparrow and Regenia Skeeter based on the following trauma criteria Elderly patients > 65 with head trauma on anti-coagulation (excluding ASA). Trauma team at the bedside on patient arrival.   Patient cleared for CT by Dr. Regenia Skeeter. Pt transported to CT with trauma response nurse present to monitor. Samantha [name] remained with the patient throughout their absence from the department for clinical observation.   GCS 15.  History   Past Medical History:  Diagnosis Date   Arthritis    Asthma    GERD (gastroesophageal reflux disease)    High cholesterol    Hypothyroidism    LBBB (left bundle branch block)    Ventricular ectopy      Past Surgical History:  Procedure Laterality Date   CARDIOVERSION N/A 11/27/2015   Procedure: CARDIOVERSION;  Surgeon: Josue Hector, MD;  Location: [place];  Service: Cardiovascular;  Laterality: N/A;   KNEE ARTHROSCOPY     TIBIA IM NAIL INSERTION Right 04/05/2018   Procedure: INTRAMEDULLARY (IM) NAIL TIBIAL;  Surgeon: Nicholes Stairs, MD;  Location: [place];  Service: Orthopedics;  Laterality: Right;       Initial Focused Assessment (If applicable, or please see trauma documentation):  Airway-- clear Breathing - Unlabored Circulation-- heart rate is 30-35-- noted to be in 3rd degree heart block. GCS- 15   CT's Completed:   CT Head and CT C-Spine   Interventions:  Labs EKG Xrays CT scans Cards consult  Plan for disposition:  Admission to [facility]   Consults completed:  none.  Event Summary:  See Primary Samantha [name], EDP and cardiology notes     Bedside handoff with ED Samantha [name] Samantha Pew, Samantha [name].    Samantha [name]  Trauma Response Samantha [name]  Please call TRN at [phone] for further assistance.

## 2022-08-05 NOTE — ED Notes (Signed)
ED TO INPATIENT HANDOFF REPORT  ED Nurse Name and Phone #: Iona Coach Name/Age/Gender Samantha [name] 89 y.o. female Room/Bed: 015C/015C  Code Status   Code Status: Full Code  Home/SNF/Other Home Patient oriented to: self, place, time, and situation Is this baseline? Yes   Triage Complete: Triage complete  Chief Complaint Complete heart block (Ages) [I44.2]  Triage Note PT BIB [facility] for a fall on Eliquis.  PT became dizzy and fell back on to her back. EMS was not aware of her hitting her head but she stated that she did once she arrived.  No obvious head trauma on scene but pt complains of HA and some eye pain behind her eyes.   PT was found to be in complete Heart block at a rate of 35 for EMS.   BP 120/65 HR 35   Allergies Allergies  Allergen Reactions   Erythromycin Diarrhea and Rash   Vibra-Tab [Doxycycline] Hives   Benzalkonium Chloride Rash   Erythromycin Rash   Fosamax [Alendronate] Nausea Only and Other (See Comments)    Severe stomach cramps   Neo-Bacit-Poly-Lidocaine Rash   Neosporin [Neomycin-Bacitracin Zn-Polymyx] Rash   Penicillins Rash    DID THE REACTION INVOLVE: Swelling of the face/tongue/throat, SOB, or low BP? N Sudden or severe rash/hives, skin peeling, or the inside of the mouth or nose? Y Did it require medical treatment? N When did it last happen?      Childhood If all above answers are "NO", may proceed with cephalosporin use.   Zithromax [Azithromycin] Diarrhea    Level of Care/Admitting Diagnosis ED Disposition     ED Disposition  Admit   Condition  --   Comment  Hospital Area: [facility] [[id number]]  Level of Care: Telemetry Cardiac [[id number]]  May place patient in observation at [facility] or [facility] if equivalent level of care is available:: No  Covid Evaluation: Asymptomatic - no recent exposure (last 10 days) testing not required  Diagnosis: Complete heart block [facility]) [id number]  Admitting Physician: Berniece Salines [id number]  Attending Physician: Berniece Salines [id number]          B Medical/Surgery History Past Medical History:  Diagnosis Date   Arthritis    Asthma    GERD (gastroesophageal reflux disease)    High cholesterol    Hypothyroidism    LBBB (left bundle branch block)    Ventricular ectopy    Past Surgical History:  Procedure Laterality Date   CARDIOVERSION N/A 11/27/2015   Procedure: CARDIOVERSION;  Surgeon: Josue Hector, MD;  Location: [place];  Service: Cardiovascular;  Laterality: N/A;   KNEE ARTHROSCOPY     TIBIA IM NAIL INSERTION Right 04/05/2018   Procedure: INTRAMEDULLARY (IM) NAIL TIBIAL;  Surgeon: Nicholes Stairs, MD;  Location: [place];  Service: Orthopedics;  Laterality: Right;     A IV Location/Drains/Wounds Patient Lines/Drains/Airways Status     Active Line/Drains/Airways     Name Placement date Placement time Site Days   Peripheral IV 08/05/22 20 G Right Antecubital 08/05/22  1547  Antecubital  less than 1   Peripheral IV 08/05/22 20 G Left Antecubital 08/05/22  1547  Antecubital  less than 1   Wound / Incision (Open or Dehisced) 04/03/18 Other (Comment) Head Right healed laceration (previously had staples) 04/03/18  2000  Head  1585            Intake/Output Last 24 hours No intake or output data in the 24 hours ending  08/05/22 1738  Labs/Imaging Results for orders placed or performed during the hospital encounter of 08/05/22 (from the past 48 hour(s))  Comprehensive metabolic panel     Status: Abnormal   Collection Time: 08/05/22  3:40 PM  Result Value Ref Range   Sodium 137 135 - 145 mmol/L   Potassium 4.3 3.5 - 5.1 mmol/L   Chloride 105 98 - 111 mmol/L   CO2 21 (L) 22 - 32 mmol/L   Glucose, Bld 185 (H) 70 - 99 mg/dL    Comment: Glucose reference range applies only to samples taken after fasting for at least 8 hours.   BUN 19 8 - 23 mg/dL   Creatinine, Ser 1.06 (H) 0.44 - 1.00 mg/dL   Calcium 9.1 8.9 - 10.3 mg/dL   Total  Protein 6.1 (L) 6.5 - 8.1 g/dL   Albumin 3.1 (L) 3.5 - 5.0 g/dL   AST 29 15 - 41 U/L   ALT 19 0 - 44 U/L   Alkaline Phosphatase 62 38 - 126 U/L   Total Bilirubin 0.6 0.3 - 1.2 mg/dL   GFR, Estimated 50 (L) >60 mL/min    Comment: (NOTE) Calculated using the CKD-EPI Creatinine Equation (2021)    Anion gap 11 5 - 15    Comment: Performed at [facility], [place] [address] [place], [place] [address]  CBC with Differential     Status: None   Collection Time: 08/05/22  3:40 PM  Result Value Ref Range   WBC 9.7 4.0 - 10.5 K/uL   RBC 3.88 3.87 - 5.11 MIL/uL   Hemoglobin 12.0 12.0 - 15.0 g/dL   HCT 37.0 36.0 - 46.0 %   MCV 95.4 80.0 - 100.0 fL   MCH 30.9 26.0 - 34.0 pg   MCHC 32.4 30.0 - 36.0 g/dL   RDW 13.9 11.5 - 15.5 %   Platelets 214 150 - 400 K/uL   nRBC 0.0 0.0 - 0.2 %   Neutrophils Relative % 68 %   Neutro Abs 6.5 1.7 - 7.7 K/uL   Lymphocytes Relative 21 %   Lymphs Abs 2.0 0.7 - 4.0 K/uL   Monocytes Relative 10 %   Monocytes Absolute 1.0 0.1 - 1.0 K/uL   Eosinophils Relative 1 %   Eosinophils Absolute 0.1 0.0 - 0.5 K/uL   Basophils Relative 0 %   Basophils Absolute 0.0 0.0 - 0.1 K/uL   Immature Granulocytes 0 %   Abs Immature Granulocytes 0.04 0.00 - 0.07 K/uL    Comment: Performed at [facility], [address] [place], [place] [address]  Magnesium     Status: None   Collection Time: 08/05/22  3:40 PM  Result Value Ref Range   Magnesium 2.2 1.7 - 2.4 mg/dL    Comment: Performed at [facility], [place] [address] [place], [place] [address]  Troponin I (High Sensitivity)     Status: None   Collection Time: 08/05/22  3:40 PM  Result Value Ref Range   Troponin I (High Sensitivity) 8 <18 ng/L    Comment: (NOTE) Elevated high sensitivity troponin I (hsTnI) values and significant  changes across serial measurements may suggest ACS but many other  chronic and acute conditions are known to elevate hsTnI results.  Refer to the "Links" section for chest pain  algorithms and additional  guidance. Performed at [facility], [place] [address], [place], [place] [address]   I-stat chem 8, ED (not at [facility], [facility] or [facility])  Status: Abnormal   Collection Time: 08/05/22  3:43 PM  Result Value Ref Range   Sodium 138 135 - 145 mmol/L   Potassium 4.4 3.5 - 5.1 mmol/L   Chloride 104 98 - 111 mmol/L   BUN 19 8 - 23 mg/dL   Creatinine, Ser 1.10 (H) 0.44 - 1.00 mg/dL   Glucose, Bld 184 (H) 70 - 99 mg/dL    Comment: Glucose reference range applies only to samples taken after fasting for at least 8 hours.   Calcium, Ion 1.20 1.15 - 1.40 mmol/L   TCO2 23 22 - 32 mmol/L   Hemoglobin 12.2 12.0 - 15.0 g/dL   HCT 36.0 36.0 - 46.0 %   CT Head Wo Contrast  Result Date: 08/05/2022 CLINICAL DATA:  Neck trauma EXAM: CT HEAD WITHOUT CONTRAST CT CERVICAL SPINE WITHOUT CONTRAST TECHNIQUE: Multidetector CT imaging of the head and cervical spine was performed following the standard protocol without intravenous contrast. Multiplanar CT image reconstructions of the cervical spine were also generated. RADIATION DOSE REDUCTION: This exam was performed according to the departmental dose-optimization program which includes automated exposure control, adjustment of the mA and/or kV according to patient size and/or use of iterative reconstruction technique. COMPARISON:  CT C Spine 03/30/22, CT Head 03/30/22 FINDINGS: CT HEAD FINDINGS Brain: No evidence of acute infarction, hemorrhage, hydrocephalus, extra-axial collection or mass lesion/mass effect. Vascular: No hyperdense vessel or unexpected calcification. Skull: Normal. Negative for fracture or focal lesion. Sinuses/Orbits: No mastoid or middle ear effusion. Paranasal sinuses are clear. Bilateral lens replacement. Orbits are otherwise unremarkable. Mild left maxillary sinus mucosal thickening. Other: None. CT CERVICAL SPINE FINDINGS Alignment: Normal. Skull base and vertebrae: No acute fracture. No primary bone lesion or focal  pathologic process. Soft tissues and spinal canal: No prevertebral fluid or swelling. No visible canal hematoma. Disc levels:  No evidence of high-grade spinal canal stenosis. Upper chest: Right apical pleural-parenchymal scarring. Other: Lobulated soft tissue lesion in the left parotid gland (series 5, image 33) measuring 1.4 x 0.9 cm likely represents an intraparotid lymph node. This is unchanged from prior IMPRESSION: 1. No acute intracranial abnormality. 2. No acute fracture or traumatic listhesis in the cervical spine. Electronically Signed   By: Marin Roberts M.D.   On: 08/05/2022 16:13   CT Cervical Spine Wo Contrast  Result Date: 08/05/2022 CLINICAL DATA:  Neck trauma EXAM: CT HEAD WITHOUT CONTRAST CT CERVICAL SPINE WITHOUT CONTRAST TECHNIQUE: Multidetector CT imaging of the head and cervical spine was performed following the standard protocol without intravenous contrast. Multiplanar CT image reconstructions of the cervical spine were also generated. RADIATION DOSE REDUCTION: This exam was performed according to the departmental dose-optimization program which includes automated exposure control, adjustment of the mA and/or kV according to patient size and/or use of iterative reconstruction technique. COMPARISON:  CT C Spine 03/30/22, CT Head 03/30/22 FINDINGS: CT HEAD FINDINGS Brain: No evidence of acute infarction, hemorrhage, hydrocephalus, extra-axial collection or mass lesion/mass effect. Vascular: No hyperdense vessel or unexpected calcification. Skull: Normal. Negative for fracture or focal lesion. Sinuses/Orbits: No mastoid or middle ear effusion. Paranasal sinuses are clear. Bilateral lens replacement. Orbits are otherwise unremarkable. Mild left maxillary sinus mucosal thickening. Other: None. CT CERVICAL SPINE FINDINGS Alignment: Normal. Skull base and vertebrae: No acute fracture. No primary bone lesion or focal pathologic process. Soft tissues and spinal canal: No prevertebral fluid or  swelling. No visible canal hematoma. Disc levels:  No evidence of high-grade spinal canal stenosis. Upper chest: Right apical pleural-parenchymal scarring. Other:  Lobulated soft tissue lesion in the left parotid gland (series 5, image 33) measuring 1.4 x 0.9 cm likely represents an intraparotid lymph node. This is unchanged from prior IMPRESSION: 1. No acute intracranial abnormality. 2. No acute fracture or traumatic listhesis in the cervical spine. Electronically Signed   By: Marin Roberts M.D.   On: 08/05/2022 16:13   DG Chest Portable 1 View  Result Date: 08/05/2022 CLINICAL DATA:  Syncope EXAM: PORTABLE CHEST 1 VIEW COMPARISON:  10/01/2021 FINDINGS: Mild cardiomegaly. Aortic atherosclerosis. No focal airspace consolidation, pleural effusion, or pneumothorax. IMPRESSION: No active disease. Electronically Signed   By: Davina Poke D.O.   On: 08/05/2022 15:53    Pending Labs Unresulted Labs (From admission, onward)     Start     Ordered   08/06/22 0500  CBC  Tomorrow morning,   R        08/05/22 1657   08/06/22 [id number]  Basic metabolic panel  Tomorrow morning,   R        08/05/22 1657   08/05/22 1715  TSH  Once,   R        08/05/22 1714            Vitals/Pain Today's Vitals   08/05/22 1515 08/05/22 1526 08/05/22 1535 08/05/22 1630  BP: (!) 111/52  (!) 118/42 (!) 143/60  Pulse: (!) 33   72  Resp: (!) 26   (!) 23  Temp:   98.4 F (36.9 C)   TempSrc: Oral  Oral   SpO2: 98%   96%  PainSc:  3       Isolation Precautions No active isolations  Medications Medications  acetaminophen (TYLENOL) tablet 650 mg (has no administration in time range)  ondansetron (ZOFRAN) injection 4 mg (has no administration in time range)  apixaban (ELIQUIS) tablet 5 mg (has no administration in time range)  metoprolol succinate (TOPROL-XL) 24 hr tablet 12.5 mg (has no administration in time range)  cephALEXin (KEFLEX) capsule 500 mg (has no administration in time range)  FLUoxetine (PROZAC) capsule  10 mg (has no administration in time range)  furosemide (LASIX) tablet 20 mg (has no administration in time range)  levothyroxine (SYNTHROID) tablet 125 mcg (has no administration in time range)  loratadine (CLARITIN) tablet 10 mg (has no administration in time range)  pravastatin (PRAVACHOL) tablet 20 mg (has no administration in time range)  montelukast (SINGULAIR) tablet 10 mg (has no administration in time range)  pantoprazole (PROTONIX) EC tablet 40 mg (has no administration in time range)    Mobility walks     Focused Assessments Cardiac Assessment Handoff:    Lab Results  Component Value Date   CKTOTAL 97 10/09/2021   TROPONINI <0.03 05/23/2014   No results found for: "DDIMER" Does the Patient currently have chest pain? No    R Recommendations: See Admitting Provider Note  Report given to:   Additional Notes: \

## 2022-08-06 ENCOUNTER — Encounter (HOSPITAL_COMMUNITY): Payer: Self-pay | Admitting: Cardiology

## 2022-08-06 DIAGNOSIS — I110 Hypertensive heart disease with heart failure: Secondary | ICD-10-CM | POA: Diagnosis not present

## 2022-08-06 DIAGNOSIS — I495 Sick sinus syndrome: Secondary | ICD-10-CM | POA: Diagnosis not present

## 2022-08-06 DIAGNOSIS — I480 Paroxysmal atrial fibrillation: Secondary | ICD-10-CM | POA: Diagnosis not present

## 2022-08-06 DIAGNOSIS — S098XXA Other specified injuries of head, initial encounter: Secondary | ICD-10-CM | POA: Diagnosis not present

## 2022-08-06 DIAGNOSIS — Z9981 Dependence on supplemental oxygen: Secondary | ICD-10-CM | POA: Diagnosis not present

## 2022-08-06 DIAGNOSIS — I059 Rheumatic mitral valve disease, unspecified: Secondary | ICD-10-CM | POA: Diagnosis not present

## 2022-08-06 DIAGNOSIS — D649 Anemia, unspecified: Secondary | ICD-10-CM | POA: Diagnosis not present

## 2022-08-06 DIAGNOSIS — D696 Thrombocytopenia, unspecified: Secondary | ICD-10-CM | POA: Diagnosis not present

## 2022-08-06 DIAGNOSIS — I442 Atrioventricular block, complete: Secondary | ICD-10-CM | POA: Diagnosis not present

## 2022-08-06 DIAGNOSIS — I5032 Chronic diastolic (congestive) heart failure: Secondary | ICD-10-CM | POA: Diagnosis not present

## 2022-08-06 DIAGNOSIS — R519 Headache, unspecified: Secondary | ICD-10-CM | POA: Diagnosis not present

## 2022-08-06 DIAGNOSIS — E039 Hypothyroidism, unspecified: Secondary | ICD-10-CM | POA: Diagnosis not present

## 2022-08-06 LAB — BASIC METABOLIC PANEL
Anion gap: 8 (ref 5–15)
BUN: 15 mg/dL (ref 8–23)
CO2: 24 mmol/L (ref 22–32)
Calcium: 9 mg/dL (ref 8.9–10.3)
Chloride: 107 mmol/L (ref 98–111)
Creatinine, Ser: 0.88 mg/dL (ref 0.44–1.00)
GFR, Estimated: >60 mL/min (ref >60)
Glucose, Bld: 104 mg/dL — ABNORMAL HIGH (ref 70–99)
Potassium: 3.7 mmol/L (ref 3.5–5.1)
Sodium: 139 mmol/L (ref 135–145)

## 2022-08-06 LAB — CBC
HCT: 36.9 % (ref 36.0–46.0)
Hemoglobin: 11.7 g/dL — ABNORMAL LOW (ref 12.0–15.0)
MCH: 29.5 pg (ref 26.0–34.0)
MCHC: 31.7 g/dL (ref 30.0–36.0)
MCV: 93.2 fL (ref 80.0–100.0)
Platelets: 220 10*3/uL (ref 150–400)
RBC: 3.96 MIL/uL (ref 3.87–5.11)
RDW: 13.5 % (ref 11.5–15.5)
WBC: 10.4 10*3/uL (ref 4.0–10.5)
nRBC: 0 % (ref 0.0–0.2)

## 2022-08-06 NOTE — Progress Notes (Signed)
   08/06/22 1824  Assess: MEWS Score  Temp 97.7 F (36.5 C)  BP 136/79  MAP (mmHg) 97  Pulse Rate 61  ECG Heart Rate (!) 118  Resp 20  Level of Consciousness Alert  SpO2 100 %  O2 Device Room Air  Assess: MEWS Score  MEWS Temp 0  MEWS Systolic 0  MEWS Pulse 2  MEWS RR 0  MEWS LOC 0  MEWS Score 2  MEWS Score Color Yellow  Assess: if the MEWS score is Yellow or Red  Were vital signs taken at a resting state? Yes  Focused Assessment Change from prior assessment (see assessment flowsheet)  Does the patient meet 2 or more of the SIRS criteria? No  MEWS guidelines implemented  Yes, yellow  Treat  MEWS Interventions Considered administering scheduled or prn medications/treatments as ordered  Take Vital Signs  Increase Vital Sign Frequency  Yellow: Q2hr x1, continue Q4hrs until patient remains green for 12hrs  Escalate  MEWS: Escalate Yellow: Discuss with charge nurse and consider notifying provider and/or RRT  Notify: Charge Nurse/RN  Name of Charge Nurse/RN Notified Beverlee Nims, RN  Provider Notification  Provider Name/Title Nunzio Cory, [place]  Date Provider Notified 08/06/22  Time Provider Notified [id number]  Method of Notification Call  Notification Reason Change in status  Provider response Other (Comment) (PA states to give ordered Metoprolol now)  Date of Provider Response 08/06/22  Assess: SIRS CRITERIA  SIRS Temperature  0  SIRS Pulse 1  SIRS Respirations  0  SIRS WBC 0  SIRS Score Sum  1

## 2022-08-06 NOTE — Progress Notes (Signed)
Progress Note  Patient Name: Samantha [name] Date of Encounter: 08/06/2022  Primary Cardiologist: Jenkins Rouge, MD   Subjective   Patient seen and examined at her bedside. She has a lot of questions about her care which I was able to answer. She is not happy about the recommendation of not driving.   Inpatient Medications    Scheduled Meds:  apixaban  5 mg Oral BID   furosemide  20 mg Oral Daily   levothyroxine  125 mcg Oral Q0600   loratadine  10 mg Oral q AM   metoprolol succinate  12.5 mg Oral Daily   montelukast  10 mg Oral QHS   pantoprazole  40 mg Oral Daily   pravastatin  20 mg Oral q1800   Continuous Infusions:  PRN Meds: acetaminophen, ondansetron (ZOFRAN) IV   Vital Signs    Vitals:   08/05/22 2325 08/06/22 0318 08/06/22 0401 08/06/22 0801  BP: (!) 151/58  (!) 125/57 (!) 147/73  Pulse: 63  (!) 59 73  Resp: '18  17 18  '$ Temp: 97.6 F (36.4 C)  98.1 F (36.7 C) 97.9 F (36.6 C)  TempSrc: Oral  Oral Oral  SpO2: 91%  93% 96%  Weight:  65.3 kg    Height:        Intake/Output Summary (Last 24 hours) at 08/06/2022 0943 Last data filed at 08/06/2022 0800 Gross per 24 hour  Intake 240 ml  Output 350 ml  Net -110 ml   Filed Weights   08/05/22 1756 08/05/22 1945 08/06/22 0318  Weight: 64.5 kg 65.7 kg 65.3 kg    Telemetry    Sinus rhythm - Personally Reviewed  ECG    none - Personally Reviewed  Physical Exam    General: Comfortable, sitting up in bedr Head: Atraumatic, normal size  Eyes: PEERLA, EOMI  Neck: Supple, normal JVD Cardiac: Normal S1, S2; RRR; no murmurs, rubs, or gallops Lungs: Clear to auscultation bilaterally Abd: Soft, nontender, no hepatomegaly  Ext: warm, no edema Musculoskeletal: No deformities, BUE and BLE strength normal and equal Skin: Warm and dry, no rashes   Neuro: Alert and oriented to person, place, time, and situation, CNII-XII grossly intact, no focal deficits  Psych: Normal mood and affect   Labs     Chemistry Recent Labs  Lab 08/05/22 1540 08/05/22 1543 08/06/22 0109  NA 137 138 139  K 4.3 4.4 3.7  CL 105 104 107  CO2 21*  --  24  GLUCOSE 185* 184* 104*  BUN '19 19 15  '$ CREATININE 1.06* 1.10* 0.88  CALCIUM 9.1  --  9.0  PROT 6.1*  --   --   ALBUMIN 3.1*  --   --   AST 29  --   --   ALT 19  --   --   ALKPHOS 62  --   --   BILITOT 0.6  --   --   GFRNONAA 50*  --  >60  ANIONGAP 11  --  8     Hematology Recent Labs  Lab 08/05/22 1540 08/05/22 1543 08/06/22 0109  WBC 9.7  --  10.4  RBC 3.88  --  3.96  HGB 12.0 12.2 11.7*  HCT 37.0 36.0 36.9  MCV 95.4  --  93.2  MCH 30.9  --  29.5  MCHC 32.4  --  31.7  RDW 13.9  --  13.5  PLT 214  --  220    Cardiac EnzymesNo results for input(s): "TROPONINI" in the  last 168 hours. No results for input(s): "TROPIPOC" in the last 168 hours.   BNPNo results for input(s): "BNP", "PROBNP" in the last 168 hours.   DDimer No results for input(s): "DDIMER" in the last 168 hours.   Radiology    CT Head Wo Contrast  Result Date: 08/05/2022 CLINICAL DATA:  Neck trauma EXAM: CT HEAD WITHOUT CONTRAST CT CERVICAL SPINE WITHOUT CONTRAST TECHNIQUE: Multidetector CT imaging of the head and cervical spine was performed following the standard protocol without intravenous contrast. Multiplanar CT image reconstructions of the cervical spine were also generated. RADIATION DOSE REDUCTION: This exam was performed according to the departmental dose-optimization program which includes automated exposure control, adjustment of the mA and/or kV according to patient size and/or use of iterative reconstruction technique. COMPARISON:  CT C Spine 03/30/22, CT Head 03/30/22 FINDINGS: CT HEAD FINDINGS Brain: No evidence of acute infarction, hemorrhage, hydrocephalus, extra-axial collection or mass lesion/mass effect. Vascular: No hyperdense vessel or unexpected calcification. Skull: Normal. Negative for fracture or focal lesion. Sinuses/Orbits: No mastoid or middle  ear effusion. Paranasal sinuses are clear. Bilateral lens replacement. Orbits are otherwise unremarkable. Mild left maxillary sinus mucosal thickening. Other: None. CT CERVICAL SPINE FINDINGS Alignment: Normal. Skull base and vertebrae: No acute fracture. No primary bone lesion or focal pathologic process. Soft tissues and spinal canal: No prevertebral fluid or swelling. No visible canal hematoma. Disc levels:  No evidence of high-grade spinal canal stenosis. Upper chest: Right apical pleural-parenchymal scarring. Other: Lobulated soft tissue lesion in the left parotid gland (series 5, image 33) measuring 1.4 x 0.9 cm likely represents an intraparotid lymph node. This is unchanged from prior IMPRESSION: 1. No acute intracranial abnormality. 2. No acute fracture or traumatic listhesis in the cervical spine. Electronically Signed   By: Marin Roberts M.D.   On: 08/05/2022 16:13   CT Cervical Spine Wo Contrast  Result Date: 08/05/2022 CLINICAL DATA:  Neck trauma EXAM: CT HEAD WITHOUT CONTRAST CT CERVICAL SPINE WITHOUT CONTRAST TECHNIQUE: Multidetector CT imaging of the head and cervical spine was performed following the standard protocol without intravenous contrast. Multiplanar CT image reconstructions of the cervical spine were also generated. RADIATION DOSE REDUCTION: This exam was performed according to the departmental dose-optimization program which includes automated exposure control, adjustment of the mA and/or kV according to patient size and/or use of iterative reconstruction technique. COMPARISON:  CT C Spine 03/30/22, CT Head 03/30/22 FINDINGS: CT HEAD FINDINGS Brain: No evidence of acute infarction, hemorrhage, hydrocephalus, extra-axial collection or mass lesion/mass effect. Vascular: No hyperdense vessel or unexpected calcification. Skull: Normal. Negative for fracture or focal lesion. Sinuses/Orbits: No mastoid or middle ear effusion. Paranasal sinuses are clear. Bilateral lens replacement. Orbits are  otherwise unremarkable. Mild left maxillary sinus mucosal thickening. Other: None. CT CERVICAL SPINE FINDINGS Alignment: Normal. Skull base and vertebrae: No acute fracture. No primary bone lesion or focal pathologic process. Soft tissues and spinal canal: No prevertebral fluid or swelling. No visible canal hematoma. Disc levels:  No evidence of high-grade spinal canal stenosis. Upper chest: Right apical pleural-parenchymal scarring. Other: Lobulated soft tissue lesion in the left parotid gland (series 5, image 33) measuring 1.4 x 0.9 cm likely represents an intraparotid lymph node. This is unchanged from prior IMPRESSION: 1. No acute intracranial abnormality. 2. No acute fracture or traumatic listhesis in the cervical spine. Electronically Signed   By: Marin Roberts M.D.   On: 08/05/2022 16:13   DG Chest Portable 1 View  Result Date: 08/05/2022 CLINICAL DATA:  Syncope EXAM:  PORTABLE CHEST 1 VIEW COMPARISON:  10/01/2021 FINDINGS: Mild cardiomegaly. Aortic atherosclerosis. No focal airspace consolidation, pleural effusion, or pneumothorax. IMPRESSION: No active disease. Electronically Signed   By: Davina Poke D.O.   On: 08/05/2022 15:53    Cardiac Studies   TTE 07/24/2022 IMPRESSIONS   1. Left ventricular ejection fraction, by estimation, is 50 to 55%. The left ventricle has low normal function. The left ventricle has no regional wall motion abnormalities. Left ventricular diastolic parameters were  normal.   2. Right ventricular systolic function is normal. The right ventricular size is normal. Tricuspid regurgitation signal is inadequate for assessing PA pressure.   3. The mitral valve is normal in structure. Moderate mitral valve regurgitation. No evidence of mitral stenosis.   4. The aortic valve is tricuspid. Aortic valve regurgitation is not  visualized. Aortic valve sclerosis is present, with no evidence of aortic  valve stenosis.   5. The inferior vena cava is normal in size with greater  than 50%  respiratory variability, suggesting right atrial pressure of 3 mmHg.   FINDINGS   Left Ventricle: Left ventricular ejection fraction, by estimation, is 50  to 55%. The left ventricle has low normal function. The left ventricle has  no regional wall motion abnormalities. The left ventricular internal  cavity size was normal in size.  There is no left ventricular hypertrophy. Left ventricular diastolic  parameters were normal.   Right Ventricle: The right ventricular size is normal. Right ventricular  systolic function is normal. Tricuspid regurgitation signal is inadequate  for assessing PA pressure. The tricuspid regurgitant velocity is 2.13 m/s,  and with an assumed right atrial   pressure of 3 mmHg, the estimated right ventricular systolic pressure is  123456 mmHg.   Left Atrium: Left atrial size was normal in size.   Right Atrium: Right atrial size was normal in size.   Pericardium: There is no evidence of pericardial effusion.   Mitral Valve: The mitral valve is normal in structure. Moderate mitral  valve regurgitation. No evidence of mitral valve stenosis.   Tricuspid Valve: The tricuspid valve is normal in structure. Tricuspid  valve regurgitation is trivial. No evidence of tricuspid stenosis.   Aortic Valve: The aortic valve is tricuspid. Aortic valve regurgitation is  not visualized. Aortic valve sclerosis is present, with no evidence of  aortic valve stenosis.   Pulmonic Valve: The pulmonic valve was normal in structure. Pulmonic valve  regurgitation is not visualized. No evidence of pulmonic stenosis.   Aorta: The aortic root is normal in size and structure.   Venous: The inferior vena cava is normal in size with greater than 50%  respiratory variability, suggesting right atrial pressure of 3 mmHg.   IAS/Shunts: No atrial level shunt detected by color flow Doppler.   Patient Profile     89 y.o. female with hx of PAF, LBBB, ventricular ectopy,  hypertension, mild MR, chronic diastolic CHF, DVT, and hypothyroidism.  Assessment & Plan    Transient complete heart block Syncope Paroxysmal atrial fibrillation  Chronic diastolic heart failure  Hypertension  Hypothyroidism  Admitted yesterday after a syncopal event.  During that time she was noted to be in complete heart block.  She has been on metoprolol succinate 50 mg daily for her paroxysmal atrial fibrillation.  Overnight this was decreased and she did get 12.5 mg daily.  She has been in sinus rhythm no pauses overnight.  She certainly is at high risk for sick sinus syndrome but with beta-blockers on board  can lead to worsening conduction disease.  First is to DC all AV nodal blockers and monitor the patient in the outpatient setting with a ambulatory monitor.  With being on 50 mg of metoprolol for now I have titrated her down to 25 mg and have her ambulate and see if that changes with any symptoms of lightheadedness or dizziness.  I will also get our EP colleagues to evaluate the patient as she is very hesitant to leave the hospital without being evaluated by EP as well.  I did discuss the [place] DMV medical guidelines for driving: "it is prudent to recommend that all persons should be free of syncopal episodes for at least six months to be granted the driving privilege." ([employer], Second Edition, Medical Review Branch, Engineer, site, Division of [employer] of Transportation, July 2004)  Her blood pressure is slightly on the higher side but has not persistently been over 150.  No recent event of her syncope I will hold off on adding additional hypertensive medication and just monitor her.  If systolics go over [id number] we will add low-dose antihypertensive.  Holding Lasix as well.  Continue her levothyroxine. Continue her PIs for GERD.  The patient is full code. DVT prophylaxis she is on  Eliquis for atrial fibrillation. Disposition-Home  For questions or updates, please contact [place] Please consult [email or web address] for contact info under Cardiology/STEMI.      SignedBerniece Salines, DO  08/06/2022, 9:43 AM

## 2022-08-06 NOTE — Progress Notes (Signed)
The patient is admitted to 3 E 23. A & O x 4. Denies any acute pain. The patient is oriented to staff, aascom/call bell. Full assessment to epic completed. Will continue to monitor.

## 2022-08-06 NOTE — TOC Progression Note (Signed)
Transition of Care [facility]) - Progression Note    Patient Details  Name: Samantha [name] MRN: [id number] Date of Birth: 1929-10-21  Transition of Care [facility]) CM/SW Contact  Zenon Mayo, RN Phone Number: 08/06/2022, 11:18 AM  Clinical Narrative:    from home alone, fall at home, hit head, CHB, lasix on hold, will need heart monitor at dc, EP following.  TOC following.        Expected Discharge Plan and Services                                               Social Determinants of Health (SDOH) Interventions SDOH Screenings   Food Insecurity: No Food Insecurity (08/05/2022)  Housing: Low Risk  (08/05/2022)  Transportation Needs: No Transportation Needs (08/05/2022)  Utilities: Not At Risk (08/05/2022)  Depression (PHQ2-9): Low Risk  (08/03/2018)  Tobacco Use: Medium Risk (08/01/2022)    Readmission Risk Interventions     No data to display

## 2022-08-06 NOTE — Progress Notes (Signed)
Pt's HR continues in A-fib, rate between 98 and 150, sustaining mostly in 130s. Pt calm, resting, no complaints. On call MD paged, MD verbalized no new orders as pt is having a pacemaker placed in morning. Will continue to monitor.

## 2022-08-06 NOTE — Progress Notes (Signed)
Contacted Nunzio Cory, Wynetta Emery, PA to notify that patient's pulse increased during conversation about surgical consent and when patient started asking questions about her code status.  Vitals and EKG obtained and provided to PA.  Patient is now in A-Fib.  Order received to give 2100 dose of Metoprolol now and to notify PA if PRN doses are needed.  Night shift RN, [place], informed.  Nunzio Cory also states that she will request Dr. Harriet Masson to discuss code status with patient tomorrow morning at patient's request.

## 2022-08-06 NOTE — Progress Notes (Signed)
   Notified by RN that patient was tachycardic with HR sustaining from 120s-140s. Per RN, patient was discussing her code status and had gotten up to use the restroom when her HR initially became elevated. After a few minutes of rest, her HR had improved but was in the 110s-120s. Systolic BP was in the Q000111Q.   Patient is on metoprolol succinate 12.5 mg daily. Her dose is scheduled for later this evening at 2100. Instructed RN to give dose now.   If needed, can treat with low dose IV metoprolol. RN will contact me if needed.   Margie Billet, PA-C 08/06/2022 6:36 PM

## 2022-08-06 NOTE — Care Management Obs Status (Signed)
[place] NOTIFICATION   Patient Details  Name: Samantha [name] MRN: [id number] Date of Birth: 06-20-1929   Medicare Observation Status Notification Given:  Yes    Zenon Mayo, RN 08/06/2022, 4:20 PM

## 2022-08-06 NOTE — Consult Note (Addendum)
ELECTROPHYSIOLOGY CONSULT NOTE    Patient ID: Samantha [name] MRN: [id number], DOB/AGE: 1929-10-21 89 y.o.  Admit date: 08/05/2022 Date of Consult: 08/06/2022  Primary Physician: Charlane Ferretti, MD Primary Cardiologist: Jenkins Rouge, MD  Electrophysiologist: new to Dr. Caryl Comes   Referring Provider: Dr. Harriet Masson  Patient Profile: Samantha [name] is a 89 y.o. female with a history of parox Afib, PVCs, LBBB, ventricular ectopy, hypertension, mild MR, chronic diastolic CHF, DVT, and hypothyroidism  who is being seen today for the evaluation of PPM in setting of syncope and bradycardia at the request of Dr. Harriet Masson.  HPI:  Samantha [name] is a 89 y.o. female with PMH as above. She presented to [facility] ER yesterday after a fall at home d/t near syncope, no LOC. She had no warnings or symptoms prior to her syncope. When EMS presented to her home, was found to be in HB with rates in mid 62s. She had a similar syncopal episode in October also without warning symptoms, and wore a monitor afterwards. Monitor showed 3% AF burden, 5% PVC burden. She has been on '50mg'$  Toprol nightly. Failed amiodarone because of abnormal PFTs and hypothyroid.  Taking eliquis '5mg'$  BID for stroke prevention.    She denies chest pain, palpitations, dyspnea, PND, orthopnea, nausea, vomiting, dizziness, syncope, edema, weight gain, or early satiety.   Labs Potassium3.7 (03/06 0109) Magnesium  2.2 (03/05 1540) Creatinine, ser  0.88 (03/06 0109) PLT  220 (03/06 0109) HGB  11.7* (03/06 0109) WBC 10.4 (03/06 0109) Troponin I (High Sensitivity)9 (03/05 1750).    Past Medical History:  Diagnosis Date   Arthritis    Asthma    GERD (gastroesophageal reflux disease)    High cholesterol    Hypothyroidism    LBBB (left bundle branch block)    Ventricular ectopy      Surgical History:  Past Surgical History:  Procedure Laterality Date   CARDIOVERSION N/A 11/27/2015   Procedure: CARDIOVERSION;  Surgeon: Josue Hector, MD;  Location:  [place];  Service: Cardiovascular;  Laterality: N/A;   KNEE ARTHROSCOPY     TIBIA IM NAIL INSERTION Right 04/05/2018   Procedure: INTRAMEDULLARY (IM) NAIL TIBIAL;  Surgeon: Nicholes Stairs, MD;  Location: [place];  Service: Orthopedics;  Laterality: Right;     Medications Prior to Admission  Medication Sig Dispense Refill Last Dose   albuterol (VENTOLIN HFA) 108 (90 Base) MCG/ACT inhaler Inhale 1-2 puffs into the lungs every 6 (six) hours as needed for wheezing or shortness of breath. 18 g 0 UNK   apixaban (ELIQUIS) 5 MG TABS tablet Take 5 mg by mouth in the morning and at bedtime.   08/05/2022 at 1000   Calcium-Magnesium-Zinc (CALCIUM-MAGNESUIUM-ZINC PO) Take 1 tablet by mouth every Monday, Wednesday, and Friday.   08/04/2022   cephALEXin (KEFLEX) 500 MG capsule Take 1 capsule (500 mg total) by mouth 2 (two) times daily for 5 days. 10 capsule 0 08/05/2022   Cholecalciferol (VITAMIN D3) 50 MCG (2000 UT) CAPS Take 2,000 Units by mouth daily.   08/05/2022   cyanocobalamin (VITAMIN B12) 1000 MCG tablet Take 1,000 mcg by mouth every Monday, Wednesday, and Friday.   08/04/2022   denosumab (PROLIA) 60 MG/ML SOSY injection Inject 60 mg into the skin every 6 (six) months.   11/2021   furosemide (LASIX) 20 MG tablet Take 20 mg by mouth See admin instructions. 20 mg once daily between 1600-1800.   08/04/2022   levothyroxine (SYNTHROID) 125 MCG tablet Take 125 mcg  by mouth daily before breakfast.   08/05/2022   loratadine (CLARITIN) 10 MG tablet Take 10 mg by mouth daily.   08/05/2022   lovastatin (MEVACOR) 20 MG tablet Take 20 mg by mouth at bedtime.   08/04/2022   Lubricants (LUBRICATING JELLY) GEL Place 1 application  vaginally daily.   08/05/2022   metoprolol succinate (TOPROL-XL) 50 MG 24 hr tablet Take 50 mg by mouth at bedtime.   08/04/2022 at 2230   montelukast (SINGULAIR) 10 MG tablet Take 10 mg by mouth at bedtime.   08/04/2022   Multiple Vitamins-Minerals ([employer] ADULT 50+ EYE HEALTH) CAPS Take 1 capsule by mouth  daily.   08/05/2022   mupirocin ointment (BACTROBAN) 2 % Apply 1 application. topically 2 (two) times daily. Apply to area on foot. (Patient taking differently: Apply 1 application  topically 2 (two) times daily. To each nostril) 15 g 0 08/05/2022   Omega-3 Fatty Acids (FISH OIL) 1000 MG CAPS Take 1,000 mg by mouth daily after breakfast.   08/05/2022   omeprazole (PRILOSEC) 40 MG capsule Take 40 mg by mouth at bedtime.   08/04/2022   polyethylene glycol (MIRALAX / GLYCOLAX) packet Take 8.5 g by mouth every other day.   08/04/2022   potassium chloride (K-DUR) 10 MEQ tablet Take 1 tablet (10 mEq total) by mouth daily. (Patient taking differently: Take 10 mEq by mouth See admin instructions. 10 mEq once daily between 1600-1800.) 90 tablet 3 08/04/2022   sodium chloride (OCEAN) 0.65 % SOLN nasal spray Place 1 spray into both nostrils 3 (three) times daily as needed (nasal dryness).   08/04/2022    Inpatient Medications:   apixaban  5 mg Oral BID   levothyroxine  125 mcg Oral Q0600   loratadine  10 mg Oral q AM   metoprolol succinate  12.5 mg Oral Daily   montelukast  10 mg Oral QHS   pantoprazole  40 mg Oral Daily   pravastatin  20 mg Oral q1800    Allergies:  Allergies  Allergen Reactions   Erythromycin Diarrhea and Rash   Vibra-Tab [Doxycycline] Hives   Benzalkonium Chloride Rash   Erythromycin Rash   Fosamax [Alendronate] Nausea Only and Other (See Comments)    Severe stomach cramps   Neo-Bacit-Poly-Lidocaine Rash   Neosporin [Neomycin-Bacitracin Zn-Polymyx] Rash   Penicillins Rash    DID THE REACTION INVOLVE: Swelling of the face/tongue/throat, SOB, or low BP? N Sudden or severe rash/hives, skin peeling, or the inside of the mouth or nose? Y Did it require medical treatment? N When did it last happen?      Childhood If all above answers are "NO", may proceed with cephalosporin use.   Zithromax [Azithromycin] Diarrhea    Family History  Problem Relation Age of Onset   Heart attack Mother     Throat cancer Father    Congestive Heart Failure Sister    Congestive Heart Failure Brother      Physical Exam: Vitals:   08/05/22 2325 08/06/22 0318 08/06/22 0401 08/06/22 0801  BP: (!) 151/58  (!) 125/57 (!) 147/73  Pulse: 63  (!) 59 73  Resp: '18  17 18  '$ Temp: 97.6 F (36.4 C)  98.1 F (36.7 C) 97.9 F (36.6 C)  TempSrc: Oral  Oral Oral  SpO2: 91%  93% 96%  Weight:  65.3 kg    Height:        GEN- NAD, A&O x 3, normal affect HEENT: Normocephalic, atraumatic Lungs- CTAB, Normal effort.  Heart- Regular rate  and rhythm, No M/G/R.  GI- Soft, NT, ND.  Extremities- No clubbing, cyanosis, or edema   Radiology/Studies: CT Head Wo Contrast  Result Date: 08/05/2022 CLINICAL DATA:  Neck trauma EXAM: CT HEAD WITHOUT CONTRAST CT CERVICAL SPINE WITHOUT CONTRAST TECHNIQUE: Multidetector CT imaging of the head and cervical spine was performed following the standard protocol without intravenous contrast. Multiplanar CT image reconstructions of the cervical spine were also generated. RADIATION DOSE REDUCTION: This exam was performed according to the departmental dose-optimization program which includes automated exposure control, adjustment of the mA and/or kV according to patient size and/or use of iterative reconstruction technique. COMPARISON:  CT C Spine 03/30/22, CT Head 03/30/22 FINDINGS: CT HEAD FINDINGS Brain: No evidence of acute infarction, hemorrhage, hydrocephalus, extra-axial collection or mass lesion/mass effect. Vascular: No hyperdense vessel or unexpected calcification. Skull: Normal. Negative for fracture or focal lesion. Sinuses/Orbits: No mastoid or middle ear effusion. Paranasal sinuses are clear. Bilateral lens replacement. Orbits are otherwise unremarkable. Mild left maxillary sinus mucosal thickening. Other: None. CT CERVICAL SPINE FINDINGS Alignment: Normal. Skull base and vertebrae: No acute fracture. No primary bone lesion or focal pathologic process. Soft tissues and spinal  canal: No prevertebral fluid or swelling. No visible canal hematoma. Disc levels:  No evidence of high-grade spinal canal stenosis. Upper chest: Right apical pleural-parenchymal scarring. Other: Lobulated soft tissue lesion in the left parotid gland (series 5, image 33) measuring 1.4 x 0.9 cm likely represents an intraparotid lymph node. This is unchanged from prior IMPRESSION: 1. No acute intracranial abnormality. 2. No acute fracture or traumatic listhesis in the cervical spine. Electronically Signed   By: Marin Roberts M.D.   On: 08/05/2022 16:13   CT Cervical Spine Wo Contrast  Result Date: 08/05/2022 CLINICAL DATA:  Neck trauma EXAM: CT HEAD WITHOUT CONTRAST CT CERVICAL SPINE WITHOUT CONTRAST TECHNIQUE: Multidetector CT imaging of the head and cervical spine was performed following the standard protocol without intravenous contrast. Multiplanar CT image reconstructions of the cervical spine were also generated. RADIATION DOSE REDUCTION: This exam was performed according to the departmental dose-optimization program which includes automated exposure control, adjustment of the mA and/or kV according to patient size and/or use of iterative reconstruction technique. COMPARISON:  CT C Spine 03/30/22, CT Head 03/30/22 FINDINGS: CT HEAD FINDINGS Brain: No evidence of acute infarction, hemorrhage, hydrocephalus, extra-axial collection or mass lesion/mass effect. Vascular: No hyperdense vessel or unexpected calcification. Skull: Normal. Negative for fracture or focal lesion. Sinuses/Orbits: No mastoid or middle ear effusion. Paranasal sinuses are clear. Bilateral lens replacement. Orbits are otherwise unremarkable. Mild left maxillary sinus mucosal thickening. Other: None. CT CERVICAL SPINE FINDINGS Alignment: Normal. Skull base and vertebrae: No acute fracture. No primary bone lesion or focal pathologic process. Soft tissues and spinal canal: No prevertebral fluid or swelling. No visible canal hematoma. Disc levels:   No evidence of high-grade spinal canal stenosis. Upper chest: Right apical pleural-parenchymal scarring. Other: Lobulated soft tissue lesion in the left parotid gland (series 5, image 33) measuring 1.4 x 0.9 cm likely represents an intraparotid lymph node. This is unchanged from prior IMPRESSION: 1. No acute intracranial abnormality. 2. No acute fracture or traumatic listhesis in the cervical spine. Electronically Signed   By: Marin Roberts M.D.   On: 08/05/2022 16:13   DG Chest Portable 1 View  Result Date: 08/05/2022 CLINICAL DATA:  Syncope EXAM: PORTABLE CHEST 1 VIEW COMPARISON:  10/01/2021 FINDINGS: Mild cardiomegaly. Aortic atherosclerosis. No focal airspace consolidation, pleural effusion, or pneumothorax. IMPRESSION: No active disease. Electronically Signed  By: Davina Poke D.O.   On: 08/05/2022 15:53   ECHOCARDIOGRAM COMPLETE  Result Date: 07/24/2022    ECHOCARDIOGRAM REPORT   Patient Name:   Samantha [name] Date of Exam: 07/24/2022 Medical Rec #:  [id number]      Height:       61.0 in Accession #:    [id number]     Weight:       143.0 lb Date of Birth:  Feb 05, 1930      BSA:          1.638 m Patient Age:    64 years       BP:           152/77 mmHg Patient Gender: F              HR:           63 bpm. Exam Location:  [place] Procedure: 2D Echo, 3D Echo, Cardiac Doppler and Color Doppler Indications:    I35.0 Aortic Stenosis  History:        Patient has prior history of Echocardiogram examinations, most                 recent 11/20/2021. Arrythmias:LBBB and Atrial Fibrillation,                 Signs/Symptoms:Chest Pain; Risk Factors:Hypertension and HLD.  Sonographer:    Marygrace Drought RCS Referring Phys: Freeman Spur  1. Left ventricular ejection fraction, by estimation, is 50 to 55%. The left ventricle has low normal function. The left ventricle has no regional wall motion abnormalities. Left ventricular diastolic parameters were normal.  2. Right ventricular systolic  function is normal. The right ventricular size is normal. Tricuspid regurgitation signal is inadequate for assessing PA pressure.  3. The mitral valve is normal in structure. Moderate mitral valve regurgitation. No evidence of mitral stenosis.  4. The aortic valve is tricuspid. Aortic valve regurgitation is not visualized. Aortic valve sclerosis is present, with no evidence of aortic valve stenosis.  5. The inferior vena cava is normal in size with greater than 50% respiratory variability, suggesting right atrial pressure of 3 mmHg. FINDINGS  Left Ventricle: Left ventricular ejection fraction, by estimation, is 50 to 55%. The left ventricle has low normal function. The left ventricle has no regional wall motion abnormalities. The left ventricular internal cavity size was normal in size. There is no left ventricular hypertrophy. Left ventricular diastolic parameters were normal. Right Ventricle: The right ventricular size is normal. Right ventricular systolic function is normal. Tricuspid regurgitation signal is inadequate for assessing PA pressure. The tricuspid regurgitant velocity is 2.13 m/s, and with an assumed right atrial  pressure of 3 mmHg, the estimated right ventricular systolic pressure is 123456 mmHg. Left Atrium: Left atrial size was normal in size. Right Atrium: Right atrial size was normal in size. Pericardium: There is no evidence of pericardial effusion. Mitral Valve: The mitral valve is normal in structure. Moderate mitral valve regurgitation. No evidence of mitral valve stenosis. Tricuspid Valve: The tricuspid valve is normal in structure. Tricuspid valve regurgitation is trivial. No evidence of tricuspid stenosis. Aortic Valve: The aortic valve is tricuspid. Aortic valve regurgitation is not visualized. Aortic valve sclerosis is present, with no evidence of aortic valve stenosis. Pulmonic Valve: The pulmonic valve was normal in structure. Pulmonic valve regurgitation is not visualized. No evidence  of pulmonic stenosis. Aorta: The aortic root is normal in size and structure. Venous: The inferior vena cava  is normal in size with greater than 50% respiratory variability, suggesting right atrial pressure of 3 mmHg. IAS/Shunts: No atrial level shunt detected by color flow Doppler.  LEFT VENTRICLE PLAX 2D LVIDd:         4.30 cm   Diastology LVIDs:         3.00 cm   LV e' medial:    7.18 cm/s LV PW:         1.10 cm   LV E/e' medial:  15.7 LV IVS:        0.90 cm   LV e' lateral:   9.36 cm/s LVOT diam:     2.00 cm   LV E/e' lateral: 12.1 LV SV:         75 LV SV Index:   46 LVOT Area:     3.14 cm                           3D Volume EF:                          3D EF:        61 %                          LV EDV:       77 ml                          LV ESV:       30 ml                          LV SV:        47 ml RIGHT VENTRICLE RV Basal diam:  2.70 cm RV S prime:     10.40 cm/s TAPSE (M-mode): 2.0 cm RVSP:           21.1 mmHg LEFT ATRIUM             Index        RIGHT ATRIUM           Index LA diam:        3.90 cm 2.38 cm/m   RA Pressure: 3.00 mmHg LA Vol (A2C):   51.2 ml 31.26 ml/m  RA Area:     10.10 cm LA Vol (A4C):   47.3 ml 28.88 ml/m  RA Volume:   16.40 ml  10.01 ml/m LA Biplane Vol: 49.7 ml 30.35 ml/m  AORTIC VALVE LVOT Vmax:   116.00 cm/s LVOT Vmean:  72.000 cm/s LVOT VTI:    0.240 m  AORTA Ao Root diam: 2.90 cm Ao Asc diam:  3.60 cm MITRAL VALVE                  TRICUSPID VALVE MV Area (PHT):                TR Peak grad:   18.1 mmHg MV Decel Time:                TR Vmax:        213.00 cm/s MR Peak grad:    92.5 mmHg    Estimated RAP:  3.00 mmHg MR Mean grad:    60.0 mmHg    RVSP:           21.1 mmHg MR Vmax:  481.00 cm/s MR Vmean:        359.0 cm/s   SHUNTS MR PISA:         1.57 cm     Systemic VTI:  0.24 m MR PISA Eff ROA: 10 mm       Systemic Diam: 2.00 cm MR PISA Radius:  0.50 cm MV E velocity: 113.00 cm/s MV A velocity: 102.00 cm/s MV E/A ratio:  1.11 Kirk Ruths MD Electronically signed  by Kirk Ruths MD Signature Date/Time: 07/24/2022/2:48:34 PM    Final    VAS US CAROTID  Result Date: 07/07/2022 Carotid Arterial Duplex Study Patient Name:  Samantha [name]  Date of Exam:   07/07/2022 Medical Rec #: [id number]       Accession #:    [id number] Date of Birth: August 15, 1929       Patient Gender: F Patient Age:   55 years Exam Location:  [facility] Procedure:      VAS US CAROTID Referring Phys: Jenkins Rouge --------------------------------------------------------------------------------  Indications:  Patient reports history of a 'blackout' episode in October 2023               where she bent down to pick up a toy and ended up passing out and               falling on the ground. She reports that they think it was related               to a medication that she was on, but she has also had some visual               disturbances and Dr. Johnsie Cancel said she could have some carotid               disease going on. Risk Factors: Hypertension, no history of smoking. Performing Technologist: Mariane Masters RVT  Examination Guidelines: A complete evaluation includes B-mode imaging, spectral Doppler, color Doppler, and power Doppler as needed of all accessible portions of each vessel. Bilateral testing is considered an integral part of a complete examination. Limited examinations for reoccurring indications may be performed as noted.  Right Carotid Findings: +----------+--------+--------+--------+------------------+--------+           PSV cm/sEDV cm/sStenosisPlaque DescriptionComments +----------+--------+--------+--------+------------------+--------+ CCA Prox  89      16                                         +----------+--------+--------+--------+------------------+--------+ CCA Distal71      15                                         +----------+--------+--------+--------+------------------+--------+ ICA Prox  51      14                                          +----------+--------+--------+--------+------------------+--------+ ICA Mid   52      16                                         +----------+--------+--------+--------+------------------+--------+ ICA Distal71      23                                         +----------+--------+--------+--------+------------------+--------+  ECA       72      9                                          +----------+--------+--------+--------+------------------+--------+ +----------+--------+-------+----------------+-------------------+           PSV cm/sEDV cmsDescribe        Arm Pressure (mmHG) +----------+--------+-------+----------------+-------------------+ [id number]      0      Multiphasic, [id number]                 +----------+--------+-------+----------------+-------------------+ +---------+--------+--+--------+-+---------+ VertebralPSV cm/s37EDV cm/s6Antegrade +---------+--------+--+--------+-+---------+  Left Carotid Findings: +----------+--------+--------+--------+------------------+--------+           PSV cm/sEDV cm/sStenosisPlaque DescriptionComments +----------+--------+--------+--------+------------------+--------+ CCA Prox  74      17                                         +----------+--------+--------+--------+------------------+--------+ CCA Distal71      17                                         +----------+--------+--------+--------+------------------+--------+ ICA Prox  58      12                                         +----------+--------+--------+--------+------------------+--------+ ICA Mid   72      20                                         +----------+--------+--------+--------+------------------+--------+ ICA Distal73      20                                         +----------+--------+--------+--------+------------------+--------+ ECA       70      11                                          +----------+--------+--------+--------+------------------+--------+ +----------+--------+--------+----------------+-------------------+           PSV cm/sEDV cm/sDescribe        Arm Pressure (mmHG) +----------+--------+--------+----------------+-------------------+ [id number]     0       Multiphasic, [id number]                 +----------+--------+--------+----------------+-------------------+ +---------+--------+--+--------+--+---------+ VertebralPSV cm/s46EDV cm/s10Antegrade +---------+--------+--+--------+--+---------+   Summary: Right Carotid: There is no evidence of stenosis in the right ICA. The                extracranial vessels were near-normal with only minimal wall                thickening or plaque. Left Carotid: There is no evidence of stenosis in the left ICA. The extracranial               vessels were near-normal with only minimal wall thickening or  plaque. Vertebrals:  Bilateral vertebral arteries demonstrate antegrade flow. Subclavians: Normal flow hemodynamics were seen in bilateral subclavian              arteries. *See table(s) above for measurements and observations.  Electronically signed by Quay Burow MD on 07/07/2022 at 4:59:45 PM.    Final      Long Term Monitor, 04/30/2022 Patch Wear Time:  13 days and 23 hours (2023-11-03T11:37:15-0400 to 2023-11-17T10:37:11-498)   Patient had a min HR of 44 bpm, max HR of 154 bpm, and avg HR of 70 bpm. Predominant underlying rhythm was Sinus Rhythm. First Degree AV Block was present. 4 Supraventricular Tachycardia runs occurred, the run with the fastest interval lasting 4 beats  with a max rate of 150 bpm, the longest lasting 35.7 secs with an avg rate of 117 bpm. Atrial Fibrillation occurred (3% burden), ranging from 57-154 bpm (avg of 103 bpm), the longest lasting 6 hours 56 mins with an avg rate of 104 bpm. True duration of  Atrial Fibrillation episodes difficult to ascertain due to the presence of artifact.  Isolated SVEs were rare (<1.0%), SVE Couplets were rare (<1.0%), and SVE Triplets were rare (<1.0%). Isolated VEs were occasional (2.9%, 39123), and no VE Couplets or VE  Triplets were present. Ventricular Bigeminy and Trigeminy were present.   EKG: 3/5 1517 - complete HB with RBBB ventricular escape 32bpm 3/5 1630 - NSR @ 72 with LAD with prolonged intraventricular conduction delay, LBBB-like  (personally reviewed)  TELEMETRY: in ER - CHB with ventricular escape in 30s Now SR in 60-80s  (personally reviewed)   Assessment/Plan: 1.  Parox Afib  LBBB  bradycardia  syncope Baseline conduction abnormalities Has had 2 syncopal episodes, most recently with CHB afterwards Long term monitor had low AF burden with minimal RVR, though RVR is present Agree with stopping BB at this time, unfortunately patient will likely need BB in the future for AF Recommend pacemaker for tachy-brady, likely Friday possibly sooner Dr. Caryl Comes discussed recommendation and rationale with patient and patient's son (via phone) Holding Eliquis for PPM procedure  Reviewed risks/benefits of procedure. Patient verbalized understanding and wished to proceed    For questions or updates, please contact [place] Please consult [email or web address] for contact info under Cardiology/STEMI.  Signed, Mamie Levers, NP  08/06/2022 10:31 AM   Syncope recurrent  Complete heart block wide QRS escape  First-degree AV block left bundle branch block-like IVCD  Atrial fibrillation-paroxysmal with a rapid ventricular response  Atrial tachycardia previously on flecainide  HFpEF  Oxygen dependent COPD  Anemia/thrombocytopenia   Patient presents with syncope and another presyncopal event found to be in complete heart block in the context of metoprolol therapy.  Unfortunately, ongoing AV nodal rate control is indicated based on her paroxysms of atrial fibrillation with average heart rate over 100 -110  Not withstanding  the need for beta-blockers, recent data in patients with bundle branch block and syncope of unclear cause (arrhythmic like syncope with abrupt onset and offset of symptoms, admission would have decreased risks of recurrent syncope with with proactive pacing; this approach stenting contradistinction to a older approach will be used implantable loop recorder to determine the need for pacing.  Heart outcomes are interestingly not changed by the pacing protocol but recurrent syncope is.  Hence, I think given her conduction system disease or paroxysms of atrial fibrillation with a rapid rate and intermittent complete heart block a pacing is indicated for relief of symptoms and the ability to maintain AV  nodal rate control.  The benefits and risks were reviewed including but not limited to death,  perforation, infection, lead dislodgement and device malfunction.  The patient understands agrees and is willing to proceed.  Discussed this also with her son online.

## 2022-08-07 ENCOUNTER — Other Ambulatory Visit: Payer: Self-pay

## 2022-08-07 ENCOUNTER — Inpatient Hospital Stay (HOSPITAL_COMMUNITY)
Admission: EM | Disposition: A | Discharge status: Home Health | Payer: Self-pay | Source: Home / Self Care | Attending: Cardiology

## 2022-08-07 DIAGNOSIS — Z79899 Other long term (current) drug therapy: Secondary | ICD-10-CM | POA: Diagnosis not present

## 2022-08-07 DIAGNOSIS — Z808 Family history of malignant neoplasm of other organs or systems: Secondary | ICD-10-CM | POA: Diagnosis not present

## 2022-08-07 DIAGNOSIS — M542 Cervicalgia: Secondary | ICD-10-CM | POA: Diagnosis not present

## 2022-08-07 DIAGNOSIS — D649 Anemia, unspecified: Secondary | ICD-10-CM | POA: Diagnosis not present

## 2022-08-07 DIAGNOSIS — Z880 Allergy status to penicillin: Secondary | ICD-10-CM | POA: Diagnosis not present

## 2022-08-07 DIAGNOSIS — I495 Sick sinus syndrome: Secondary | ICD-10-CM | POA: Diagnosis not present

## 2022-08-07 DIAGNOSIS — Z7983 Long term (current) use of bisphosphonates: Secondary | ICD-10-CM | POA: Diagnosis not present

## 2022-08-07 DIAGNOSIS — Z7901 Long term (current) use of anticoagulants: Secondary | ICD-10-CM | POA: Diagnosis not present

## 2022-08-07 DIAGNOSIS — I442 Atrioventricular block, complete: Secondary | ICD-10-CM | POA: Diagnosis not present

## 2022-08-07 DIAGNOSIS — Z888 Allergy status to other drugs, medicaments and biological substances status: Secondary | ICD-10-CM | POA: Diagnosis not present

## 2022-08-07 DIAGNOSIS — J4489 Other specified chronic obstructive pulmonary disease: Secondary | ICD-10-CM | POA: Diagnosis not present

## 2022-08-07 DIAGNOSIS — I480 Paroxysmal atrial fibrillation: Secondary | ICD-10-CM | POA: Diagnosis not present

## 2022-08-07 DIAGNOSIS — Z8249 Family history of ischemic heart disease and other diseases of the circulatory system: Secondary | ICD-10-CM | POA: Diagnosis not present

## 2022-08-07 DIAGNOSIS — I059 Rheumatic mitral valve disease, unspecified: Secondary | ICD-10-CM | POA: Diagnosis not present

## 2022-08-07 DIAGNOSIS — Z9981 Dependence on supplemental oxygen: Secondary | ICD-10-CM | POA: Diagnosis not present

## 2022-08-07 DIAGNOSIS — R519 Headache, unspecified: Secondary | ICD-10-CM | POA: Diagnosis not present

## 2022-08-07 DIAGNOSIS — E7800 Pure hypercholesterolemia, unspecified: Secondary | ICD-10-CM | POA: Diagnosis not present

## 2022-08-07 DIAGNOSIS — I110 Hypertensive heart disease with heart failure: Secondary | ICD-10-CM | POA: Diagnosis not present

## 2022-08-07 DIAGNOSIS — I5032 Chronic diastolic (congestive) heart failure: Secondary | ICD-10-CM | POA: Diagnosis not present

## 2022-08-07 DIAGNOSIS — D696 Thrombocytopenia, unspecified: Secondary | ICD-10-CM | POA: Diagnosis not present

## 2022-08-07 DIAGNOSIS — Y92009 Unspecified place in unspecified non-institutional (private) residence as the place of occurrence of the external cause: Secondary | ICD-10-CM | POA: Diagnosis not present

## 2022-08-07 DIAGNOSIS — Z881 Allergy status to other antibiotic agents status: Secondary | ICD-10-CM | POA: Diagnosis not present

## 2022-08-07 DIAGNOSIS — E039 Hypothyroidism, unspecified: Secondary | ICD-10-CM | POA: Diagnosis not present

## 2022-08-07 DIAGNOSIS — W19XXXA Unspecified fall, initial encounter: Secondary | ICD-10-CM | POA: Diagnosis not present

## 2022-08-07 DIAGNOSIS — K219 Gastro-esophageal reflux disease without esophagitis: Secondary | ICD-10-CM | POA: Diagnosis not present

## 2022-08-07 DIAGNOSIS — S098XXA Other specified injuries of head, initial encounter: Secondary | ICD-10-CM | POA: Diagnosis not present

## 2022-08-07 HISTORY — PX: PACEMAKER IMPLANT: EP1218

## 2022-08-07 LAB — SURGICAL PCR SCREEN
MRSA, PCR: NEGATIVE
Staphylococcus aureus: NEGATIVE

## 2022-08-07 SURGERY — PACEMAKER IMPLANT

## 2022-08-07 MED ORDER — VANCOMYCIN HCL IN DEXTROSE 1-5 GM/200ML-% IV SOLN
INTRAVENOUS | Status: AC
  Filled 2022-08-07: qty 200

## 2022-08-07 MED ORDER — CHLORHEXIDINE GLUCONATE 4 % EX LIQD: 60.0000 mL | Freq: Once | CUTANEOUS | Status: AC

## 2022-08-07 MED ORDER — HEPARIN (PORCINE) IN NACL 1000-0.9 UT/500ML-% IV SOLN
INTRAVENOUS | Status: DC | PRN
  Administered 2022-08-07: 500 mL

## 2022-08-07 MED ORDER — SODIUM CHLORIDE 0.9 % IV SOLN
INTRAVENOUS | Status: AC
  Filled 2022-08-07: qty 2

## 2022-08-07 MED ORDER — SODIUM CHLORIDE 0.9 % IV SOLN: INTRAVENOUS | Status: DC

## 2022-08-07 MED ORDER — CHLORHEXIDINE GLUCONATE 4 % EX LIQD
60.0000 mL | Freq: Once | CUTANEOUS | Status: AC
  Administered 2022-08-07: 4 via TOPICAL
  Filled 2022-08-07: qty 60

## 2022-08-07 MED ORDER — VANCOMYCIN HCL IN DEXTROSE 1-5 GM/200ML-% IV SOLN
1000.0000 mg | Freq: Two times a day (BID) | INTRAVENOUS | Status: AC
  Administered 2022-08-08: 1000 mg via INTRAVENOUS
  Filled 2022-08-07: qty 200

## 2022-08-07 MED ORDER — FENTANYL CITRATE (PF) 100 MCG/2ML IJ SOLN
INTRAMUSCULAR | Status: DC | PRN
  Administered 2022-08-07: 25 ug via INTRAVENOUS

## 2022-08-07 MED ORDER — HEPARIN SODIUM (PORCINE) 1000 UNIT/ML IJ SOLN
INTRAMUSCULAR | Status: AC
  Filled 2022-08-07: qty 10

## 2022-08-07 MED ORDER — VANCOMYCIN HCL IN DEXTROSE 1-5 GM/200ML-% IV SOLN
1000.0000 mg | INTRAVENOUS | Status: AC
  Administered 2022-08-07: 1000 mg via INTRAVENOUS

## 2022-08-07 MED ORDER — MIDAZOLAM HCL 5 MG/5ML IJ SOLN
INTRAMUSCULAR | Status: DC | PRN
  Administered 2022-08-07: 1 mg via INTRAVENOUS

## 2022-08-07 MED ORDER — MIDAZOLAM HCL 5 MG/5ML IJ SOLN
INTRAMUSCULAR | Status: AC
  Filled 2022-08-07: qty 5

## 2022-08-07 MED ORDER — LIDOCAINE HCL (PF) 1 % IJ SOLN
INTRAMUSCULAR | Status: DC | PRN
  Administered 2022-08-07: 40 mL

## 2022-08-07 MED ORDER — FENTANYL CITRATE (PF) 100 MCG/2ML IJ SOLN
INTRAMUSCULAR | Status: AC
  Filled 2022-08-07: qty 2

## 2022-08-07 MED ORDER — SODIUM CHLORIDE 0.9 % IV SOLN
80.0000 mg | INTRAVENOUS | Status: AC
  Administered 2022-08-07: 80 mg
  Filled 2022-08-07: qty 2

## 2022-08-07 MED ORDER — LIDOCAINE HCL 1 % IJ SOLN
INTRAMUSCULAR | Status: AC
  Filled 2022-08-07: qty 60

## 2022-08-07 SURGICAL SUPPLY — 14 items
CABLE SURGICAL S-101-97-12 (CABLE) ×1 IMPLANT
CATH CPS LOCATOR 3D MED (CATHETERS) IMPLANT
HELIX LOCKING TOOL (MISCELLANEOUS) ×1
LEAD ULTIPACE 52 LPA1231/52 (Lead) IMPLANT
LEAD ULTIPACE 65 LPA1231/65 (Lead) IMPLANT
PACEMAKER ASSURITY DR-RF (Pacemaker) IMPLANT
PAD DEFIB RADIO PHYSIO CONN (PAD) ×1 IMPLANT
SHEATH 7FR PRELUDE SNAP 13 (SHEATH) IMPLANT
SHEATH 9FR PRELUDE SNAP 13 (SHEATH) IMPLANT
SHEATH PROBE COVER 6X72 (BAG) IMPLANT
SLITTER AGILIS HISPRO (INSTRUMENTS) IMPLANT
TOOL HELIX LOCKING (MISCELLANEOUS) IMPLANT
TRAY PACEMAKER INSERTION (PACKS) ×1 IMPLANT
WIRE HI TORQ VERSACORE-J 145CM (WIRE) IMPLANT

## 2022-08-07 NOTE — Interval H&P Note (Signed)
History and Physical Interval Note:  08/07/2022 4:15 PM  Samantha [name]  has presented today for surgery, with the diagnosis of tachy brady.  The various methods of treatment have been discussed with the patient and family. After consideration of risks, benefits and other options for treatment, the patient has consented to  Procedure(s): PACEMAKER IMPLANT (N/A) as a surgical intervention.  The patient's history has been reviewed, patient examined, no change in status, stable for surgery.  I have reviewed the patient's chart and labs.  Questions were answered to the patient's satisfaction.     Shakir Petrosino [employer]

## 2022-08-07 NOTE — TOC CAGE-AID Note (Signed)
Transition of Care [facility]) - CAGE-AID Screening  Patient Details  Name: Samantha [name] MRN: [id number] Date of Birth: 1930-04-07  Clinical Narrative:  Patient denies any alcohol or drug use, no need for resources at this time.  CAGE-AID Screening:   Have You Ever Felt You Ought to Cut Down on Your Drinking or Drug Use?: No Have People Annoyed You By Critizing Your Drinking Or Drug Use?: No Have You Felt Bad Or Guilty About Your Drinking Or Drug Use?: No Have You Ever Had a Drink or Used Drugs First Thing In The Morning to Steady Your Nerves or to Get Rid of a Hangover?: No CAGE-AID Score: 0  Substance Abuse Education Offered: No

## 2022-08-07 NOTE — Plan of Care (Signed)

## 2022-08-07 NOTE — H&P (View-Only) (Signed)
Electrophysiology Rounding Note  Patient Name: Samantha [name] Date of Encounter: 08/07/2022  Primary Cardiologist: Jenkins Rouge, MD Electrophysiologist: new to Dr. Curt Bears    Subjective   NAEON. Has been in AF since yesterday, no complaints.   Inpatient Medications    Scheduled Meds:  gentamicin (GARAMYCIN) 80 mg in sodium chloride 0.9 % 500 mL irrigation  80 mg Irrigation On Call   levothyroxine  125 mcg Oral Q0600   loratadine  10 mg Oral q AM   metoprolol succinate  12.5 mg Oral Daily   montelukast  10 mg Oral QHS   pantoprazole  40 mg Oral Daily   pravastatin  20 mg Oral q1800   Continuous Infusions:  sodium chloride     sodium chloride 50 mL/hr at 08/07/22 0638   vancomycin     PRN Meds: acetaminophen, ondansetron (ZOFRAN) IV   Vital Signs    Vitals:   08/06/22 2136 08/06/22 2344 08/07/22 0525 08/07/22 0714  BP: 124/81 100/66 107/64 (!) 140/75  Pulse: (!) 124 87 81 76  Resp: '18 20 18 18  '$ Temp: 97.8 F (36.6 C) 98 F (36.7 C) 98 F (36.7 C) 98 F (36.7 C)  TempSrc: Oral Oral Oral Oral  SpO2: 95% 92% 97% 98%  Weight:   64.2 kg   Height:        Intake/Output Summary (Last 24 hours) at 08/07/2022 0941 Last data filed at 08/07/2022 0918 Gross per 24 hour  Intake 180 ml  Output 1850 ml  Net -1670 ml   Filed Weights   08/05/22 1945 08/06/22 0318 08/07/22 0525  Weight: 65.7 kg 65.3 kg 64.2 kg    Physical Exam    GEN- The patient is well appearing, alert and oriented x 3 today.   HEENT- No gross abnormality.  Lungs- Clear to ausculation bilaterally, normal work of breathing Heart- Irregularly irregular rate and rhythm, no murmurs, rubs or gallops GI- soft, NT, ND, + BS Extremities- no clubbing or cyanosis. No edema Neuro- No obvious focal abnormality.   Labs    CBC Recent Labs    08/05/22 1540 08/05/22 1543 08/06/22 0109  WBC 9.7  --  10.4  NEUTROABS 6.5  --   --   HGB 12.0 12.2 11.7*  HCT 37.0 36.0 36.9  MCV 95.4  --  93.2  PLT 214  --   [id number]   Basic Metabolic Panel Recent Labs    08/05/22 1540 08/05/22 1543 08/06/22 0109  NA 137 138 139  K 4.3 4.4 3.7  CL 105 104 107  CO2 21*  --  24  GLUCOSE 185* 184* 104*  BUN '19 19 15  '$ CREATININE 1.06* 1.10* 0.88  CALCIUM 9.1  --  9.0  MG 2.2  --   --    Liver Function Tests Recent Labs    08/05/22 1540  AST 29  ALT 19  ALKPHOS 62  BILITOT 0.6  PROT 6.1*  ALBUMIN 3.1*   No results for input(s): "LIPASE", "AMYLASE" in the last 72 hours. Cardiac Enzymes No results for input(s): "CKTOTAL", "CKMB", "CKMBINDEX", "TROPONINI" in the last 72 hours.   Telemetry    Afib, rates 80-120s (personally reviewed)  Radiology    CT Head Wo Contrast  Result Date: 08/05/2022 CLINICAL DATA:  Neck trauma EXAM: CT HEAD WITHOUT CONTRAST CT CERVICAL SPINE WITHOUT CONTRAST TECHNIQUE: Multidetector CT imaging of the head and cervical spine was performed following the standard protocol without intravenous contrast. Multiplanar CT image reconstructions of the cervical spine  were also generated. RADIATION DOSE REDUCTION: This exam was performed according to the departmental dose-optimization program which includes automated exposure control, adjustment of the mA and/or kV according to patient size and/or use of iterative reconstruction technique. COMPARISON:  CT C Spine 03/30/22, CT Head 03/30/22 FINDINGS: CT HEAD FINDINGS Brain: No evidence of acute infarction, hemorrhage, hydrocephalus, extra-axial collection or mass lesion/mass effect. Vascular: No hyperdense vessel or unexpected calcification. Skull: Normal. Negative for fracture or focal lesion. Sinuses/Orbits: No mastoid or middle ear effusion. Paranasal sinuses are clear. Bilateral lens replacement. Orbits are otherwise unremarkable. Mild left maxillary sinus mucosal thickening. Other: None. CT CERVICAL SPINE FINDINGS Alignment: Normal. Skull base and vertebrae: No acute fracture. No primary bone lesion or focal pathologic process. Soft tissues  and spinal canal: No prevertebral fluid or swelling. No visible canal hematoma. Disc levels:  No evidence of high-grade spinal canal stenosis. Upper chest: Right apical pleural-parenchymal scarring. Other: Lobulated soft tissue lesion in the left parotid gland (series 5, image 33) measuring 1.4 x 0.9 cm likely represents an intraparotid lymph node. This is unchanged from prior IMPRESSION: 1. No acute intracranial abnormality. 2. No acute fracture or traumatic listhesis in the cervical spine. Electronically Signed   By: Marin Roberts M.D.   On: 08/05/2022 16:13   CT Cervical Spine Wo Contrast  Result Date: 08/05/2022 CLINICAL DATA:  Neck trauma EXAM: CT HEAD WITHOUT CONTRAST CT CERVICAL SPINE WITHOUT CONTRAST TECHNIQUE: Multidetector CT imaging of the head and cervical spine was performed following the standard protocol without intravenous contrast. Multiplanar CT image reconstructions of the cervical spine were also generated. RADIATION DOSE REDUCTION: This exam was performed according to the departmental dose-optimization program which includes automated exposure control, adjustment of the mA and/or kV according to patient size and/or use of iterative reconstruction technique. COMPARISON:  CT C Spine 03/30/22, CT Head 03/30/22 FINDINGS: CT HEAD FINDINGS Brain: No evidence of acute infarction, hemorrhage, hydrocephalus, extra-axial collection or mass lesion/mass effect. Vascular: No hyperdense vessel or unexpected calcification. Skull: Normal. Negative for fracture or focal lesion. Sinuses/Orbits: No mastoid or middle ear effusion. Paranasal sinuses are clear. Bilateral lens replacement. Orbits are otherwise unremarkable. Mild left maxillary sinus mucosal thickening. Other: None. CT CERVICAL SPINE FINDINGS Alignment: Normal. Skull base and vertebrae: No acute fracture. No primary bone lesion or focal pathologic process. Soft tissues and spinal canal: No prevertebral fluid or swelling. No visible canal hematoma.  Disc levels:  No evidence of high-grade spinal canal stenosis. Upper chest: Right apical pleural-parenchymal scarring. Other: Lobulated soft tissue lesion in the left parotid gland (series 5, image 33) measuring 1.4 x 0.9 cm likely represents an intraparotid lymph node. This is unchanged from prior IMPRESSION: 1. No acute intracranial abnormality. 2. No acute fracture or traumatic listhesis in the cervical spine. Electronically Signed   By: Marin Roberts M.D.   On: 08/05/2022 16:13   DG Chest Portable 1 View  Result Date: 08/05/2022 CLINICAL DATA:  Syncope EXAM: PORTABLE CHEST 1 VIEW COMPARISON:  10/01/2021 FINDINGS: Mild cardiomegaly. Aortic atherosclerosis. No focal airspace consolidation, pleural effusion, or pneumothorax. IMPRESSION: No active disease. Electronically Signed   By: Davina Poke D.O.   On: 08/05/2022 15:53    Patient Profile     SAY MILICH is a 89 y.o. female with a past medical history significant for parox Afib, PVCs, LBBB, ventricular ectopy, hypertension, mild MR, chronic diastolic CHF, DVT, and hypothyroidism .  she was admitted for syncope and bradycardia. EP brought onboard to evaluate for PPM.  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  Signed, Mamie Levers, NP  08/07/2022, 9:41 AM   I have seen and examined this patient with Mamie Levers.  Agree with above, note added to reflect my findings.  Feeling well today without  complaints  GEN: Well nourished, well developed, in no acute distress  HEENT: normal  Neck: no JVD, carotid bruits, or masses Cardiac: RRR; no murmurs, rubs, or gallops,no edema  Respiratory:  clear to auscultation bilaterally, normal work of breathing GI: soft, nontender, nondistended, + BS MS: no deformity or atrophy  Skin: warm and dry Neuro:  Strength and sensation are intact Psych: euthymic mood, full affect   Paroxysmal atrial fibrillation Complete heart block Tachybradycardia syndrome Has continued to have episodes of atrial fibrillation.  Camillo Quadros require metoprolol for rate control.  Janye Maynor plan for pacemaker implant due to tachybradycardia syndrome.  Risk and benefits have been discussed.  Risk include bleeding, tamponade, infection, pneumothorax, lead dislodgment.  She understands the risks and has agreed to the procedure.  Trevor Wilkie M. Anamika Kueker MD 08/07/2022 10:21 AM

## 2022-08-07 NOTE — Progress Notes (Addendum)
Electrophysiology Rounding Note  Patient Name: Samantha [name] Date of Encounter: 08/07/2022  Primary Cardiologist: Jenkins Rouge, MD Electrophysiologist: new to Dr. Curt Bears    Subjective   NAEON. Has been in AF since yesterday, no complaints.   Inpatient Medications    Scheduled Meds:  gentamicin (GARAMYCIN) 80 mg in sodium chloride 0.9 % 500 mL irrigation  80 mg Irrigation On Call   levothyroxine  125 mcg Oral Q0600   loratadine  10 mg Oral q AM   metoprolol succinate  12.5 mg Oral Daily   montelukast  10 mg Oral QHS   pantoprazole  40 mg Oral Daily   pravastatin  20 mg Oral q1800   Continuous Infusions:  sodium chloride     sodium chloride 50 mL/hr at 08/07/22 0638   vancomycin     PRN Meds: acetaminophen, ondansetron (ZOFRAN) IV   Vital Signs    Vitals:   08/06/22 2136 08/06/22 2344 08/07/22 0525 08/07/22 0714  BP: 124/81 100/66 107/64 (!) 140/75  Pulse: (!) 124 87 81 76  Resp: '18 20 18 18  '$ Temp: 97.8 F (36.6 C) 98 F (36.7 C) 98 F (36.7 C) 98 F (36.7 C)  TempSrc: Oral Oral Oral Oral  SpO2: 95% 92% 97% 98%  [name]:   64.2 kg   Height:        Intake/Output Summary (Last 24 hours) at 08/07/2022 0941 Last data filed at 08/07/2022 0918 Gross per 24 hour  Intake 180 ml  Output 1850 ml  Net -1670 ml   Filed Weights   08/05/22 1945 08/06/22 0318 08/07/22 0525  [name]: 65.7 kg 65.3 kg 64.2 kg    Physical Exam    GEN- The patient is well appearing, alert and oriented x 3 today.   HEENT- No gross abnormality.  Lungs- Clear to ausculation bilaterally, normal work of breathing Heart- Irregularly irregular rate and rhythm, no murmurs, rubs or gallops GI- soft, NT, ND, + BS Extremities- no clubbing or cyanosis. No edema Neuro- No obvious focal abnormality.   Labs    CBC Recent Labs    08/05/22 1540 08/05/22 1543 08/06/22 0109  WBC 9.7  --  10.4  NEUTROABS 6.5  --   --   HGB 12.0 12.2 11.7*  HCT 37.0 36.0 36.9  MCV 95.4  --  93.2  PLT 214  --   [id number]   Basic Metabolic Panel Recent Labs    08/05/22 1540 08/05/22 1543 08/06/22 0109  NA 137 138 139  K 4.3 4.4 3.7  CL 105 104 107  CO2 21*  --  24  GLUCOSE 185* 184* 104*  BUN '19 19 15  '$ CREATININE 1.06* 1.10* 0.88  CALCIUM 9.1  --  9.0  MG 2.2  --   --    Liver Function Tests Recent Labs    08/05/22 1540  AST 29  ALT 19  ALKPHOS 62  BILITOT 0.6  PROT 6.1*  ALBUMIN 3.1*   No results for input(s): "LIPASE", "AMYLASE" in the last 72 hours. Cardiac Enzymes No results for input(s): "CKTOTAL", "CKMB", "CKMBINDEX", "TROPONINI" in the last 72 hours.   Telemetry    Afib, rates 80-120s (personally reviewed)  Radiology    CT Head Wo Contrast  Result Date: 08/05/2022 CLINICAL DATA:  Neck trauma EXAM: CT HEAD WITHOUT CONTRAST CT CERVICAL SPINE WITHOUT CONTRAST TECHNIQUE: Multidetector CT imaging of the head and cervical spine was performed following the standard protocol without intravenous contrast. Multiplanar CT image reconstructions of the cervical spine  were also generated. RADIATION DOSE REDUCTION: This exam was performed according to the departmental dose-optimization program which includes automated exposure control, adjustment of the mA and/or kV according to patient size and/or use of iterative reconstruction technique. COMPARISON:  CT C Spine 03/30/22, CT Head 03/30/22 FINDINGS: CT HEAD FINDINGS Brain: No evidence of acute infarction, hemorrhage, hydrocephalus, extra-axial collection or mass lesion/mass effect. Vascular: No hyperdense vessel or unexpected calcification. Skull: Normal. Negative for fracture or focal lesion. Sinuses/Orbits: No mastoid or middle ear effusion. Paranasal sinuses are clear. Bilateral lens replacement. Orbits are otherwise unremarkable. Mild left maxillary sinus mucosal thickening. Other: None. CT CERVICAL SPINE FINDINGS Alignment: Normal. Skull base and vertebrae: No acute fracture. No primary bone lesion or focal pathologic process. Soft tissues  and spinal canal: No prevertebral fluid or swelling. No visible canal hematoma. Disc levels:  No evidence of high-grade spinal canal stenosis. Upper chest: Right apical pleural-parenchymal scarring. Other: Lobulated soft tissue lesion in the left parotid gland (series 5, image 33) measuring 1.4 x 0.9 cm likely represents an intraparotid lymph node. This is unchanged from prior IMPRESSION: 1. No acute intracranial abnormality. 2. No acute fracture or traumatic listhesis in the cervical spine. Electronically Signed   By: Marin Roberts MortonD.   On: 08/05/2022 16:13   CT Cervical Spine Wo Contrast  Result Date: 08/05/2022 CLINICAL DATA:  Neck trauma EXAM: CT HEAD WITHOUT CONTRAST CT CERVICAL SPINE WITHOUT CONTRAST TECHNIQUE: Multidetector CT imaging of the head and cervical spine was performed following the standard protocol without intravenous contrast. Multiplanar CT image reconstructions of the cervical spine were also generated. RADIATION DOSE REDUCTION: This exam was performed according to the departmental dose-optimization program which includes automated exposure control, adjustment of the mA and/or kV according to patient size and/or use of iterative reconstruction technique. COMPARISON:  CT C Spine 03/30/22, CT Head 03/30/22 FINDINGS: CT HEAD FINDINGS Brain: No evidence of acute infarction, hemorrhage, hydrocephalus, extra-axial collection or mass lesion/mass effect. Vascular: No hyperdense vessel or unexpected calcification. Skull: Normal. Negative for fracture or focal lesion. Sinuses/Orbits: No mastoid or middle ear effusion. Paranasal sinuses are clear. Bilateral lens replacement. Orbits are otherwise unremarkable. Mild left maxillary sinus mucosal thickening. Other: None. CT CERVICAL SPINE FINDINGS Alignment: Normal. Skull base and vertebrae: No acute fracture. No primary bone lesion or focal pathologic process. Soft tissues and spinal canal: No prevertebral fluid or swelling. No visible canal hematoma.  Disc levels:  No evidence of high-grade spinal canal stenosis. Upper chest: Right apical pleural-parenchymal scarring. Other: Lobulated soft tissue lesion in the left parotid gland (series 5, image 33) measuring 1.4 x 0.9 cm likely represents an intraparotid lymph node. This is unchanged from prior IMPRESSION: 1. No acute intracranial abnormality. 2. No acute fracture or traumatic listhesis in the cervical spine. Electronically Signed   By: Marin Roberts MortonD.   On: 08/05/2022 16:13   DG Chest Portable 1 View  Result Date: 08/05/2022 CLINICAL DATA:  Syncope EXAM: PORTABLE CHEST 1 VIEW COMPARISON:  10/01/2021 FINDINGS: Mild cardiomegaly. Aortic atherosclerosis. No focal airspace consolidation, pleural effusion, or pneumothorax. IMPRESSION: No active disease. Electronically Signed   By: Davina Poke D.O.   On: 08/05/2022 15:53    Patient Profile     AREONA FRIERSON is a 89 y.o. female with a past medical history significant for parox Afib, PVCs, LBBB, ventricular ectopy, hypertension, mild MR, chronic diastolic CHF, DVT, and hypothyroidism .  she was admitted for syncope and bradycardia. EP brought onboard to evaluate for PPM.  Assessment & Plan    #) Parox Afib  1st degree HB with LBBB-like baseline CHB w RBBB ventricular escape syncope Baseline conduction abnormalities Has had 2 syncopal episodes, most recently with CHB afterwards Long term monitor in 2023 had low AF burden with minimal RVR, though RVR was present On low-dose BB for now Scheduled for PPM this afternoon, NPO for procedure CHA2DS2-VASc Score = 4    Holding eliquis for procedure, anticipate resuming post-op    For questions or updates, please contact [place] Please consult [email or web address] for contact info under Cardiology/STEMI.  Signed, Mamie Levers, NP  08/07/2022, 9:41 AM   I have seen and examined this patient with Mamie Levers.  Agree with above, note added to reflect my findings.  Feeling well today without  complaints  GEN: Well nourished, well developed, in no acute distress  HEENT: normal  Neck: no JVD, carotid bruits, or masses Cardiac: RRR; no murmurs, rubs, or gallops,no edema  Respiratory:  clear to auscultation bilaterally, normal work of breathing GI: soft, nontender, nondistended, + BS MS: no deformity or atrophy  Skin: warm and dry Neuro:  Strength and sensation are intact Psych: euthymic mood, full affect   Paroxysmal atrial fibrillation Complete heart block Tachybradycardia syndrome Has continued to have episodes of atrial fibrillation.  Samantha [name] require metoprolol for rate control.  Samantha [name] plan for pacemaker implant due to tachybradycardia syndrome.  Risk and benefits have been discussed.  Risk include bleeding, tamponade, infection, pneumothorax, lead dislodgment.  She understands the risks and has agreed to the procedure.  Samantha [name] M. Samantha Keenum MD 08/07/2022 10:21 AM

## 2022-08-08 ENCOUNTER — Encounter (HOSPITAL_COMMUNITY): Payer: Self-pay | Admitting: Cardiology

## 2022-08-08 ENCOUNTER — Inpatient Hospital Stay (HOSPITAL_COMMUNITY): Payer: Medicare HMO

## 2022-08-08 DIAGNOSIS — S098XXA Other specified injuries of head, initial encounter: Secondary | ICD-10-CM | POA: Diagnosis not present

## 2022-08-08 DIAGNOSIS — D696 Thrombocytopenia, unspecified: Secondary | ICD-10-CM | POA: Diagnosis not present

## 2022-08-08 DIAGNOSIS — I499 Cardiac arrhythmia, unspecified: Secondary | ICD-10-CM | POA: Diagnosis not present

## 2022-08-08 DIAGNOSIS — I5032 Chronic diastolic (congestive) heart failure: Secondary | ICD-10-CM | POA: Diagnosis not present

## 2022-08-08 DIAGNOSIS — R42 Dizziness and giddiness: Secondary | ICD-10-CM | POA: Diagnosis not present

## 2022-08-08 DIAGNOSIS — R000 Tachycardia, unspecified: Secondary | ICD-10-CM | POA: Diagnosis not present

## 2022-08-08 DIAGNOSIS — E039 Hypothyroidism, unspecified: Secondary | ICD-10-CM | POA: Diagnosis not present

## 2022-08-08 DIAGNOSIS — I495 Sick sinus syndrome: Secondary | ICD-10-CM | POA: Diagnosis not present

## 2022-08-08 DIAGNOSIS — R519 Headache, unspecified: Secondary | ICD-10-CM | POA: Diagnosis not present

## 2022-08-08 DIAGNOSIS — I442 Atrioventricular block, complete: Secondary | ICD-10-CM | POA: Diagnosis not present

## 2022-08-08 DIAGNOSIS — Z743 Need for continuous supervision: Secondary | ICD-10-CM | POA: Diagnosis not present

## 2022-08-08 DIAGNOSIS — I110 Hypertensive heart disease with heart failure: Secondary | ICD-10-CM | POA: Diagnosis not present

## 2022-08-08 DIAGNOSIS — Z9981 Dependence on supplemental oxygen: Secondary | ICD-10-CM | POA: Diagnosis not present

## 2022-08-08 DIAGNOSIS — I059 Rheumatic mitral valve disease, unspecified: Secondary | ICD-10-CM | POA: Diagnosis not present

## 2022-08-08 DIAGNOSIS — I517 Cardiomegaly: Secondary | ICD-10-CM | POA: Diagnosis not present

## 2022-08-08 DIAGNOSIS — R0902 Hypoxemia: Secondary | ICD-10-CM | POA: Diagnosis not present

## 2022-08-08 DIAGNOSIS — D649 Anemia, unspecified: Secondary | ICD-10-CM | POA: Diagnosis not present

## 2022-08-08 DIAGNOSIS — I480 Paroxysmal atrial fibrillation: Secondary | ICD-10-CM | POA: Diagnosis not present

## 2022-08-08 MED ORDER — METOPROLOL SUCCINATE ER 50 MG PO TB24: 50.0000 mg | ORAL_TABLET | Freq: Every day | ORAL | Status: DC

## 2022-08-08 MED ORDER — APIXABAN 5 MG PO TABS: 5.0000 mg | ORAL_TABLET | Freq: Two times a day (BID) | ORAL | Status: AC

## 2022-08-08 MED FILL — Lidocaine HCl Local Inj 1%: INTRAMUSCULAR | Qty: 20 | Status: AC

## 2022-08-08 MED FILL — Lidocaine HCl Local Inj 1%: INTRAMUSCULAR | Qty: 40 | Status: AC

## 2022-08-08 NOTE — Discharge Summary (Addendum)
ELECTROPHYSIOLOGY PROCEDURE DISCHARGE SUMMARY    Patient ID: Samantha [name],  MRN: [id number], DOB/AGE: 1930-03-16 89 y.o.  Admit date: 08/05/2022 Discharge date: 08/08/2022  Primary Care Physician: Charlane Ferretti, MD  Primary Cardiologist: Jenkins Rouge, MD  Electrophysiologist: Dr. Curt Bears   Primary Discharge Diagnosis:  CHB w RBBB ventricular escape status post pacemaker implantation this admission  Secondary Discharge Diagnosis:  Syncope Parox AFib  Allergies  Allergen Reactions   Erythromycin Diarrhea and Rash   Vibra-Tab [Doxycycline] Hives   Benzalkonium Chloride Rash   Erythromycin Rash   Fosamax [Alendronate] Nausea Only and Other (See Comments)    Severe stomach cramps   Neo-Bacit-Poly-Lidocaine Rash   Neosporin [Neomycin-Bacitracin Zn-Polymyx] Rash   Penicillins Rash    DID THE REACTION INVOLVE: Swelling of the face/tongue/throat, SOB, or low BP? N Sudden or severe rash/hives, skin peeling, or the inside of the mouth or nose? Y Did it require medical treatment? N When did it last happen?      Childhood If all above answers are "NO", may proceed with cephalosporin use.   Zithromax [Azithromycin] Diarrhea     Procedures This Admission:  1.  Implantation of a Abbott Dual Chamber PPM on 08/07/2022 by Dr. Curt Bears. The patient received a Abbott Assurity [id number] with a Abbott Ultipace 1231-52 right atrial lead and a Abbott Ultipace 1231-65 right ventricular lead.  There were no immediate post procedure complications.   2.  CXR on 08/08/2022 demonstrated no pneumothorax status post device implantation.       Brief HPI: Samantha [name] is a 89 y.o. female was admitted for syncope, found to be in complete heart block and electrophysiology team asked to see for consideration of PPM implantation.  Past medical history includes parox afib, PVCs, LBBB, HTN, diastolic CHF.  The patient has had AV block without reversible causes identified.  Risks, benefits, and alternatives  to PPM implantation were reviewed with the patient who wished to proceed.   Hospital Course:  The patient was admitted and underwent implantation of a Abbott dual chamber PPM with details as outlined above.  She was monitored on telemetry overnight which demonstrated NSR.  Left chest was without hematoma or ecchymosis.  The device was interrogated and found to be functioning normally.  CXR was obtained and demonstrated no pneumothorax status post device implantation.  Wound care, arm mobility, and restrictions were reviewed with the patient.  The patient was examined and considered stable for discharge to home.    Anticoagulation resumption This patient should resume their Eliquis on Sunday August 10, 2022    Physical Exam: Vitals:   08/07/22 1925 08/07/22 2356 08/08/22 0436 08/08/22 0528  BP: 139/67 (!) 115/49 120/68   Pulse: 77 74 66   Resp: '18 18 18   '$ Temp: (!) 97.5 F (36.4 C) 97.8 F (36.6 C) 97.6 F (36.4 C)   TempSrc: Oral Oral Oral   SpO2: 98% 98% 99%   Weight:    64.7 kg  Height:        GEN- NAD. A&O x 3.  HEENT: Normocephalic, atraumatic Lungs- CTAB, Normal effort.  Heart- RRR, No M/G/R.  GI- Soft, NT, ND.  Extremities- No clubbing, cyanosis, or edema;  Skin- warm and dry, no rash or lesion, left chest without hematoma/ecchymosis  Discharge Medications:  Allergies as of 08/08/2022       Reactions   Erythromycin Diarrhea, Rash   Vibra-tab [doxycycline] Hives   Benzalkonium Chloride Rash   Erythromycin Rash   Fosamax [  alendronate] Nausea Only, Other (See Comments)   Severe stomach cramps   Neo-bacit-poly-lidocaine Rash   Neosporin [neomycin-bacitracin Zn-polymyx] Rash   Penicillins Rash   DID THE REACTION INVOLVE: Swelling of the face/tongue/throat, SOB, or low BP? N Sudden or severe rash/hives, skin peeling, or the inside of the mouth or nose? Y Did it require medical treatment? N When did it last happen?      Childhood If all above answers are "NO", may  proceed with cephalosporin use.   Zithromax [azithromycin] Diarrhea        Medication List     STOP taking these medications    cephALEXin 500 MG capsule Commonly known as: KEFLEX       TAKE these medications    albuterol 108 (90 Base) MCG/ACT inhaler Commonly known as: VENTOLIN HFA Inhale 1-2 puffs into the lungs every 6 (six) hours as needed for wheezing or shortness of breath.   apixaban 5 MG Tabs tablet Commonly known as: ELIQUIS Take 1 tablet (5 mg total) by mouth in the morning and at bedtime. Start taking on: August 10, 2022 What changed: These instructions start on August 10, 2022. If you are unsure what to do until then, ask your doctor or other care provider.   CALCIUM-MAGNESUIUM-ZINC PO Take 1 tablet by mouth every Monday, Wednesday, and Friday.   [facility] Adult 50+ [facility] Caps Take 1 capsule by mouth daily.   cyanocobalamin 1000 MCG tablet Commonly known as: VITAMIN B12 Take 1,000 mcg by mouth every Monday, Wednesday, and Friday.   Fish Oil 1000 MG Caps Take 1,000 mg by mouth daily after breakfast.   furosemide 20 MG tablet Commonly known as: LASIX Take 20 mg by mouth See admin instructions. 20 mg once daily between 1600-1800.   levothyroxine 125 MCG tablet Commonly known as: SYNTHROID Take 125 mcg by mouth daily before breakfast.   loratadine 10 MG tablet Commonly known as: CLARITIN Take 10 mg by mouth daily.   lovastatin 20 MG tablet Commonly known as: MEVACOR Take 20 mg by mouth at bedtime.   Lubricating Jelly Gel Place 1 application  vaginally daily.   metoprolol succinate 50 MG 24 hr tablet Commonly known as: TOPROL-XL Take 50 mg by mouth at bedtime.   montelukast 10 MG tablet Commonly known as: SINGULAIR Take 10 mg by mouth at bedtime.   mupirocin ointment 2 % Commonly known as: BACTROBAN Apply 1 application. topically 2 (two) times daily. Apply to area on foot. What changed: additional instructions   omeprazole 40 MG  capsule Commonly known as: PRILOSEC Take 40 mg by mouth at bedtime.   polyethylene glycol 17 g packet Commonly known as: MIRALAX / GLYCOLAX Take 8.5 g by mouth every other day.   potassium chloride 10 MEQ tablet Commonly known as: KLOR-CON Take 1 tablet (10 mEq total) by mouth daily. What changed:  when to take this additional instructions   Prolia 60 MG/ML Sosy injection Generic drug: denosumab Inject 60 mg into the skin every 6 (six) months.   sodium chloride 0.65 % Soln nasal spray Commonly known as: OCEAN Place 1 spray into both nostrils 3 (three) times daily as needed (nasal dryness).   Vitamin D3 50 MCG (2000 UT) Caps Generic drug: Cholecalciferol Take 2,000 Units by mouth daily.        Disposition:    Follow-up Information     [facility]. Go on 08/21/2022.   Specialty: Cardiology Why: 3/21 at 3:20pm Contact information: [place]  [address], [place] [id number] [place] [phone]        Charlane Ferretti, MD. Go on 08/18/2022.   Specialty: Internal Medicine Why: '@11'$ :15am Contact information: [address] [place] [address] [phone]         [place] Follow up.   Why: Agency [facility] Laham call you to set up apt times for [facility] services.        [place], [place]: [place] Contact information: [place] [address] [phone]                 Duration of Discharge Encounter: Greater than 30 minutes including physician time.  Signed, Mamie Levers, NP  08/08/2022 12:12 PM   I have seen and examined this patient with Suzann riddle.  Agree with above, note added to reflect my findings.  On exam, RRR, no murmurs, lungs clear.  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  Device functioning appropriately.  Chest x-ray and interrogation without  issue.  Plan for discharge today with follow-up in device clinic.  Gillermo Poch M. Doriana Mazurkiewicz MD 08/08/2022 4:14 PM

## 2022-08-08 NOTE — Plan of Care (Signed)
  Problem: Education: Goal: Knowledge of General Education information will improve Description: Including pain rating scale, medication(s)/side effects and non-pharmacologic comfort measures Outcome: Adequate for Discharge   Problem: Health Behavior/Discharge Planning: Goal: Ability to manage health-related needs will improve Outcome: Adequate for Discharge   Problem: Clinical Measurements: Goal: Ability to maintain clinical measurements within normal limits will improve Outcome: Adequate for Discharge Goal: Will remain free from infection Outcome: Adequate for Discharge Goal: Diagnostic test results will improve Outcome: Adequate for Discharge Goal: Respiratory complications will improve Outcome: Adequate for Discharge Goal: Cardiovascular complication will be avoided Outcome: Adequate for Discharge   Problem: Activity: Goal: Risk for activity intolerance will decrease Outcome: Adequate for Discharge   Problem: Nutrition: Goal: Adequate nutrition will be maintained Outcome: Adequate for Discharge   Problem: Coping: Goal: Level of anxiety will decrease Outcome: Adequate for Discharge   Problem: Elimination: Goal: Will not experience complications related to bowel motility Outcome: Adequate for Discharge Goal: Will not experience complications related to urinary retention Outcome: Adequate for Discharge   Problem: Pain Managment: Goal: General experience of comfort will improve Outcome: Adequate for Discharge   Problem: Safety: Goal: Ability to remain free from injury will improve Outcome: Adequate for Discharge   Problem: Skin Integrity: Goal: Risk for impaired skin integrity will decrease Outcome: Adequate for Discharge   Problem: Education: Goal: Knowledge of General Education information will improve Description: Including pain rating scale, medication(s)/side effects and non-pharmacologic comfort measures Outcome: Adequate for Discharge   Problem: Health  Behavior/Discharge Planning: Goal: Ability to manage health-related needs will improve Outcome: Adequate for Discharge   Problem: Clinical Measurements: Goal: Ability to maintain clinical measurements within normal limits will improve Outcome: Adequate for Discharge Goal: Will remain free from infection Outcome: Adequate for Discharge Goal: Diagnostic test results will improve Outcome: Adequate for Discharge Goal: Respiratory complications will improve Outcome: Adequate for Discharge Goal: Cardiovascular complication will be avoided Outcome: Adequate for Discharge   Problem: Activity: Goal: Risk for activity intolerance will decrease Outcome: Adequate for Discharge   Problem: Nutrition: Goal: Adequate nutrition will be maintained Outcome: Adequate for Discharge   Problem: Coping: Goal: Level of anxiety will decrease Outcome: Adequate for Discharge   Problem: Elimination: Goal: Will not experience complications related to bowel motility Outcome: Adequate for Discharge Goal: Will not experience complications related to urinary retention Outcome: Adequate for Discharge   Problem: Pain Managment: Goal: General experience of comfort will improve Outcome: Adequate for Discharge   Problem: Safety: Goal: Ability to remain free from injury will improve Outcome: Adequate for Discharge   Problem: Skin Integrity: Goal: Risk for impaired skin integrity will decrease Outcome: Adequate for Discharge   Problem: Education: Goal: Knowledge of cardiac device and self-care will improve Outcome: Adequate for Discharge Goal: Ability to safely manage health related needs after discharge will improve Outcome: Adequate for Discharge Goal: Individualized Educational Video(s) Outcome: Adequate for Discharge   Problem: Cardiac: Goal: Ability to achieve and maintain adequate cardiopulmonary perfusion will improve Outcome: Adequate for Discharge

## 2022-08-08 NOTE — Progress Notes (Signed)
Patient alert and oriented, verbalized understanding of dc instructions. All belongings given to patient. Transmitter in box also given to patient.Patient ate lunch prior to dc, tolerated diet well.Daughter in law Butch Penny coming to pick up at the main entrance.

## 2022-08-08 NOTE — Discharge Instructions (Addendum)
After Your Pacemaker   You have a Abbott Pacemaker  ACTIVITY Do not lift your arm above shoulder height for 1 week after your procedure. After 7 days, you may progress as below.  You should remove your sling 24 hours after your procedure, unless otherwise instructed by your provider.     Friday August 15, 2022  Saturday August 16, 2022 Sunday August 17, 2022 Monday August 18, 2022   Do not lift, push, pull, or carry anything over 10 pounds with the affected arm until 6 weeks (Friday September 19, 2022 ) after your procedure.   You may drive AFTER your wound check, unless you have been told otherwise by your provider.   Ask your healthcare provider when you can go back to work   INCISION/Dressing If you are on a blood thinner such as Coumadin, Xarelto, Eliquis, Plavix, or Pradaxa please confirm with your provider when this should be resumed.  Restart Eliquis Sunday 3/10 PM  If large square, outer bandage is left in place, this can be removed after 24 hours from your procedure. Do not remove steri-strips or glue as below.   Monitor your Pacemaker site for redness, swelling, and drainage. Call the device clinic at [phone] if you experience these symptoms or fever/chills.  If your incision is sealed with Steri-strips or staples, you may shower 7 days after your procedure or when told by your provider. Do not remove the steri-strips or let the shower hit directly on your site. You may wash around your site with soap and water.    If you were discharged in a sling, please do not wear this during the day more than 48 hours after your surgery unless otherwise instructed. This may increase the risk of stiffness and soreness in your shoulder.   Avoid lotions, ointments, or perfumes over your incision until it is well-healed.  You may use a hot tub or a pool AFTER your wound check appointment if the incision is completely closed.  Pacemaker Alerts:  Some alerts are vibratory and others beep.  These are NOT emergencies. Please call our office to let us know. If this occurs at night or on weekends, it can wait until the next business day. Send a remote transmission.  If your device is capable of reading fluid status (for heart failure), you will be offered monthly monitoring to review this with you.   DEVICE MANAGEMENT Remote monitoring is used to monitor your pacemaker from home. This monitoring is scheduled every 91 days by our office. It allows [place] to keep an eye on the functioning of your device to ensure it is working properly. You will routinely see your Electrophysiologist annually (more often if necessary).   You should receive your ID card for your new device in 4-8 weeks. Keep this card with you at all times once received. Consider wearing a medical alert bracelet or necklace.  Your Pacemaker may be MRI compatible. This will be discussed at your next office visit/wound check.  You should avoid contact with strong electric or magnetic fields.   Do not use amateur (ham) radio equipment or electric (arc) welding torches. MP3 player headphones with magnets should not be used. Some devices are safe to use if held at least 12 inches (30 cm) from your Pacemaker. These include power tools, lawn mowers, and speakers. If you are unsure if something is safe to use, ask your health care provider.  When using your cell phone, hold it to the ear that is on  the opposite side from the Pacemaker. Do not leave your cell phone in a pocket over the Pacemaker.  You may safely use electric blankets, heating pads, computers, and microwave ovens.  Call the office right away if: You have chest pain. You feel more short of breath than you have felt before. You feel more light-headed than you have felt before. Your incision starts to open up.  This information is not intended to replace advice given to you by your health care provider. Make sure you discuss any questions you have with your health care  provider.

## 2022-08-08 NOTE — Evaluation (Signed)
Occupational Therapy Evaluation Patient Details Name: Samantha [name] MRN: [id number] DOB: [birth year] Today's Date: 08/08/2022   History of Present Illness 89 y.o. female presents to [facility] hospital on 08/05/2022 as a transfer from [facility] ED after a fall at home with near syncope. Pt found to be in complete heart block. Pacemaker implanted 3/7. PMH includes PAF, LBBB, HTN, MR, chronic diastolic CHF, DVT.   Clinical Impression   PTA, pt lives alone and typically Modified Independent with ADLs, IADLs and mobility. Pt presents with minor deficits in standing balance and endurance. Pt also benefits from intermittent problem solving cues for pacemaker precaution implementation. Overall, pt requires min guard for mobility with handheld assist (has cane for use at home) and min guard for ADLs. Pt eager to maintain independence and receptive to [facility] therapy follow up if insurance will cover. Will continue to follow acutely.      Recommendations for follow up therapy are one component of a multi-disciplinary discharge planning process, led by the attending physician.  Recommendations may be updated based on patient status, additional functional criteria and insurance authorization.   Follow Up Recommendations  Home health OT     Assistance Recommended at Discharge Set up Supervision/Assistance  Patient can return home with the following A little help with bathing/dressing/bathroom;Assistance with cooking/housework;Assist for transportation    Functional Status Assessment  Patient has had a recent decline in their functional status and demonstrates the ability to make significant improvements in function in a reasonable and predictable amount of time.  Equipment Recommendations  None recommended by OT    Recommendations for Other Services       Precautions / Restrictions Precautions Precautions: Fall;ICD/Pacemaker Restrictions Weight Bearing Restrictions: No      Mobility Bed  Mobility Overal bed mobility: Needs Assistance Bed Mobility: Supine to Sit     Supine to sit: Supervision, HOB elevated     General bed mobility comments: use of bedrail    Transfers Overall transfer level: Needs assistance Equipment used: 1 person hand held assist Transfers: Sit to/from Stand Sit to Stand: Min guard           General transfer comment: increased time      Balance Overall balance assessment: Needs assistance Sitting-balance support: No upper extremity supported, Feet supported Sitting balance-Leahy Scale: Good     Standing balance support: Single extremity supported, Reliant on assistive device for balance Standing balance-Leahy Scale: Fair                             ADL either performed or assessed with clinical judgement   ADL Overall ADL's : Needs assistance/impaired Eating/Feeding: Independent   Grooming: Supervision/safety;Standing;Wash/dry hands   Upper Body Bathing: Supervision/ safety;Sitting   Lower Body Bathing: Min guard   Upper Body Dressing : Supervision/safety Upper Body Dressing Details (indicate cue type and reason): able to verbally report donning affected UE first and button up vs pullover shirts Lower Body Dressing: Min guard   Toilet Transfer: Min guard;Ambulation;Regular Toilet;Grab bars Toilet Transfer Details (indicate cue type and reason): handheld assist as cane not available Toileting- Clothing Manipulation and Hygiene: Min guard;Sit to/from stand;Sitting/lateral lean Toileting - Clothing Manipulation Details (indicate cue type and reason): cues for clothingmgmt and allowance to use L UE to pull down underwear in the front. increased time to complete; assist for back gown to prevent soilage     Functional mobility during ADLs: Min guard General ADL Comments: Pt with  some anxiety regarding mgmt of pacemaker precautions though moving fairly well. benefits from cues for problem solving and mgmt of ADLs      Vision Ability to See in Adequate Light: 0 Adequate Patient Visual Report: No change from baseline Vision Assessment?: No apparent visual deficits     Perception     Praxis      Pertinent Vitals/Pain Pain Assessment Pain Assessment: No/denies pain     Hand Dominance Right   Extremity/Trunk Assessment Upper Extremity Assessment Upper Extremity Assessment: LUE deficits/detail LUE Deficits / Details: digit/wrist/elbow ROM WFL, shoulder ROM limitations due to PPM precautions   Lower Extremity Assessment Lower Extremity Assessment: Defer to PT evaluation   Cervical / Trunk Assessment Cervical / Trunk Assessment: Normal   Communication Communication Communication: No difficulties;Other (comment) (hearing aide)   Cognition Arousal/Alertness: Awake/alert Behavior During Therapy: WFL for tasks assessed/performed Overall Cognitive Status: History of cognitive impairments - at baseline Area of Impairment: Memory                     Memory: Decreased recall of precautions         General Comments: pt reports chronic short term memory deficits, requires intermittent cues for precautions     General Comments  VSS on RA, hypertension after mobility, 165/70    Exercises     Shoulder Instructions      Home Living Family/patient expects to be discharged to:: Private residence Living Arrangements: Alone Available Help at Discharge: Family;Available PRN/intermittently (family have offered to let the pt stay with them however pt wants to return home, will be alone during day regardless per pt report) Type of Home: Mobile home Home Access: Stairs to enter Entrance Stairs-Number of Steps: 5 Entrance Stairs-Rails: Can reach both Home Layout: One level         Bathroom Toilet: Standard (toilet riser on top)     Home Equipment: Rolling Walker (2 wheels);Rollator (4 wheels);Cane - single point;Toilet riser;Other (comment) (lift chair)          Prior  Functioning/Environment Prior Level of Function : Independent/Modified Independent                        OT Problem List: Impaired balance (sitting and/or standing);Decreased activity tolerance;Decreased cognition;Decreased knowledge of precautions      OT Treatment/Interventions: Self-care/ADL training;Therapeutic exercise;Energy conservation;DME and/or AE instruction;Therapeutic activities;Patient/family education    OT Goals(Current goals can be found in the care plan section) Acute Rehab OT Goals Patient Stated Goal: home today, follow precautions well OT Goal Formulation: With patient Time For Goal Achievement: 08/22/22 Potential to Achieve Goals: Good  OT Frequency: Min 2X/week    Co-evaluation              AM-PAC OT "6 Clicks" Daily Activity     Outcome Measure Help from another person eating meals?: None Help from another person taking care of personal grooming?: A Little Help from another person toileting, which includes using toliet, bedpan, or urinal?: A Little Help from another person bathing (including washing, rinsing, drying)?: A Little Help from another person to put on and taking off regular upper body clothing?: A Little Help from another person to put on and taking off regular lower body clothing?: A Little 6 Click Score: 19   End of Session Equipment Utilized During Treatment: Gait belt Nurse Communication: Mobility status  Activity Tolerance: Patient tolerated treatment well Patient left: in bed;with call bell/phone within reach;with bed alarm set  OT Visit Diagnosis: Other abnormalities of gait and mobility (R26.89);Unsteadiness on feet (R26.81)                Time: [id number] OT Time Calculation (min): 30 min Charges:  OT General Charges $OT Visit: 1 Visit OT Evaluation $OT Eval Moderate Complexity: 1 Mod OT Treatments $Self Care/Home Management : 8-22 mins  Malachy Chamber, OTR/L Acute Rehab Services Office: [phone]   Samantha [name] 08/08/2022, 9:58 AM

## 2022-08-08 NOTE — Progress Notes (Signed)
Ccmd notified of dc order. Patient denies cp or sob. Piv dcd, site unremarkable.

## 2022-08-08 NOTE — TOC Transition Note (Signed)
Transition of Care [facility]) - CM/SW Discharge Note   Patient Details  Name: ROYCE PAWLOWICZ MRN: [id number] Date of Birth: 1929/10/22  Transition of Care [facility]) CM/SW Contact:  Zenon Mayo, RN Phone Number: 08/08/2022, 10:09 AM   Clinical Narrative:    Patient is for dc today, NCM offered choice to patient with Medicare.gov list, she states she does not have a preference.  NCM made referral to  [facility] with [place], she is able to take referral, soc will begin 24 to 48 hrs post dc.  She states her daughter will be transporting her home when she is ready.    Final next level of care: Home w Home Health Services Barriers to Discharge: No Barriers Identified   Patient Goals and CMS Choice CMS Medicare.gov Compare Post Acute Care list provided to:: Patient Choice offered to / list presented to : Patient  Discharge Placement                         Discharge Plan and Services Additional resources added to the After Visit Summary for                  DME Arranged: N/A DME Agency: NA       HH Arranged: PT, OT HH Agency:  [facility]) Date [place]: 08/08/22 Time [place]: 1009 Representative spoke with at [place]: Camilla ([place]) Interventions SDOH Screenings   Food Insecurity: No Food Insecurity (08/05/2022)  Housing: Low Risk  (08/05/2022)  Transportation Needs: No Transportation Needs (08/05/2022)  Utilities: Not At Risk (08/05/2022)  Depression (PHQ2-9): Low Risk  (08/03/2018)  Tobacco Use: Medium Risk (08/08/2022)     Readmission Risk Interventions     No data to display

## 2022-08-08 NOTE — Evaluation (Signed)
Physical Therapy Evaluation Patient Details Name: Samantha [name] MRN: [id number] DOB: [birth year] Today's Date: 08/08/2022  History of Present Illness  89 y.o. female presents to [facility] hospital on 08/05/2022 as a transfer from [facility] ED after a fall at home with near syncope. Pt found to be in complete heart block. Pacemaker implanted 3/7. PMH includes PAF, LBBB, HTN, MR, chronic diastolic CHF, DVT.  Clinical Impression  Pt presents to PT with deficits in strength, power, gait, balance, endurance. Pt is able to transfer and ambulate with use of SPC. PT provides cues and suggestions for mobility techniques to maintain use of pacemaker precautions. Pt may benefit from sleeping in lift chair at the time of discharge to reduce struggles of mobilizing in bed with LUE ROM limitations. PT recommends OT consult for assessment of ADLs. PT recommends HHPT at the time of discharge to improve gait, balance, and mobility quality, while also ensuring gradual return to LUE ROM within PPM precautions.       Recommendations for follow up therapy are one component of a multi-disciplinary discharge planning process, led by the attending physician.  Recommendations may be updated based on patient status, additional functional criteria and insurance authorization.  Follow Up Recommendations Home health PT      Assistance Recommended at Discharge Intermittent Supervision/Assistance  Patient can return home with the following  A little help with walking and/or transfers;A little help with bathing/dressing/bathroom;Assistance with cooking/housework;Assist for transportation;Help with stairs or ramp for entrance    Equipment Recommendations None recommended by PT  Recommendations for Other Services       Functional Status Assessment Patient has had a recent decline in their functional status and demonstrates the ability to make significant improvements in function in a reasonable and predictable amount of time.      Precautions / Restrictions Precautions Precautions: Fall;ICD/Pacemaker Restrictions Weight Bearing Restrictions: No      Mobility  Bed Mobility Overal bed mobility: Needs Assistance Bed Mobility: Rolling, Sidelying to Sit, Sit to Supine Rolling: Min guard Sidelying to sit: Min guard   Sit to supine: Min assist   General bed mobility comments: minA for LE management when returning to bed. PT suggestion to sleep in lift chair temporarily    Transfers Overall transfer level: Needs assistance Equipment used: Straight cane Transfers: Sit to/from Stand Sit to Stand: Min guard           General transfer comment: increased time    Ambulation/Gait Ambulation/Gait assistance: Min guard Gait Distance (Feet): 200 Feet Assistive device: Straight cane Gait Pattern/deviations: Step-through pattern Gait velocity: reduced Gait velocity interpretation: <1.8 ft/sec, indicate of risk for recurrent falls   General Gait Details: slowed step-through gait  Stairs Stairs: Yes Stairs assistance: Min guard Stair Management: One rail Right, Step to pattern Number of Stairs: 5    Wheelchair Mobility    Modified Rankin (Stroke Patients Only)       Balance Overall balance assessment: Needs assistance Sitting-balance support: No upper extremity supported, Feet supported Sitting balance-Leahy Scale: Good     Standing balance support: Single extremity supported, Reliant on assistive device for balance Standing balance-Leahy Scale: Poor                               Pertinent Vitals/Pain Pain Assessment Pain Assessment: No/denies pain    Home Living Family/patient expects to be discharged to:: Private residence Living Arrangements: Alone Available Help at Discharge: Family;Available PRN/intermittently (family have  offered to let the pt stay with them however pt wants to return home, will be alone during day regardless per pt report) Type of Home: Mobile  home Home Access: Stairs to enter Entrance Stairs-Rails: Can reach both Entrance Stairs-Number of Steps: 5   Home Layout: One level Home Equipment: Conservation officer, nature (2 wheels);Rollator (4 wheels);Cane - single point      Prior Function Prior Level of Function : Independent/Modified Independent                     Hand Dominance        Extremity/Trunk Assessment   Upper Extremity Assessment Upper Extremity Assessment: LUE deficits/detail LUE Deficits / Details: digit/wrist/elbow ROM WFL, shoulder ROM limitations due to PPM precautions    Lower Extremity Assessment Lower Extremity Assessment: Overall WFL for tasks assessed    Cervical / Trunk Assessment Cervical / Trunk Assessment: Normal  Communication   Communication: No difficulties  Cognition Arousal/Alertness: Awake/alert Behavior During Therapy: WFL for tasks assessed/performed Overall Cognitive Status: History of cognitive impairments - at baseline Area of Impairment: Memory                     Memory: Decreased recall of precautions         General Comments: pt reports chronic short term memory deficits, requires intermittent cues to not push with LUE during bed mobility        General Comments General comments (skin integrity, edema, etc.): VSS on RA, hypertension after mobility, 165/70    Exercises     Assessment/Plan    PT Assessment Patient needs continued PT services  PT Problem List Decreased strength;Decreased activity tolerance;Decreased balance;Decreased mobility;Decreased knowledge of precautions       PT Treatment Interventions DME instruction;Gait training;Stair training;Functional mobility training;Therapeutic activities;Balance training;Neuromuscular re-education;Patient/family education    PT Goals (Current goals can be found in the Care Plan section)  Acute Rehab PT Goals Patient Stated Goal: to go home PT Goal Formulation: With patient Time For Goal Achievement:  08/22/22 Potential to Achieve Goals: Good    Frequency Min 3X/week     Co-evaluation               AM-PAC PT "6 Clicks" Mobility  Outcome Measure Help needed turning from your back to your side while in a flat bed without using bedrails?: A Little Help needed moving from lying on your back to sitting on the side of a flat bed without using bedrails?: A Little Help needed moving to and from a bed to a chair (including a wheelchair)?: A Little Help needed standing up from a chair using your arms (e.g., wheelchair or bedside chair)?: A Little Help needed to walk in hospital room?: A Little Help needed climbing 3-5 steps with a railing? : A Little 6 Click Score: 18    End of Session   Activity Tolerance: Patient tolerated treatment well Patient left: in bed;with call bell/phone within reach Nurse Communication: Mobility status PT Visit Diagnosis: Other abnormalities of gait and mobility (R26.89);History of falling (Z91.81);Muscle weakness (generalized) (M62.81)    Time: [id number] PT Time Calculation (min) (ACUTE ONLY): 37 min   Charges:   PT Evaluation $PT Eval Low Complexity: Morrison, PT, DPT Acute Rehabilitation Office [phone]   Zenaida Niece 08/08/2022, 9:17 AM

## 2022-08-09 ENCOUNTER — Emergency Department (HOSPITAL_COMMUNITY)
Admission: EM | Admit: 2022-08-09 | Discharge: 2022-08-09 | Disposition: A | Discharge status: Home / Self Care | Payer: Medicare HMO | Attending: Emergency Medicine | Admitting: Emergency Medicine

## 2022-08-09 ENCOUNTER — Other Ambulatory Visit: Payer: Self-pay

## 2022-08-09 ENCOUNTER — Telehealth: Payer: Self-pay | Admitting: Cardiology

## 2022-08-09 DIAGNOSIS — R778 Other specified abnormalities of plasma proteins: Secondary | ICD-10-CM | POA: Diagnosis not present

## 2022-08-09 DIAGNOSIS — I10 Essential (primary) hypertension: Secondary | ICD-10-CM | POA: Insufficient documentation

## 2022-08-09 DIAGNOSIS — Z950 Presence of cardiac pacemaker: Secondary | ICD-10-CM | POA: Insufficient documentation

## 2022-08-09 DIAGNOSIS — R7989 Other specified abnormal findings of blood chemistry: Secondary | ICD-10-CM | POA: Diagnosis not present

## 2022-08-09 DIAGNOSIS — J45909 Unspecified asthma, uncomplicated: Secondary | ICD-10-CM | POA: Insufficient documentation

## 2022-08-09 DIAGNOSIS — R42 Dizziness and giddiness: Secondary | ICD-10-CM | POA: Insufficient documentation

## 2022-08-09 DIAGNOSIS — D72829 Elevated white blood cell count, unspecified: Secondary | ICD-10-CM | POA: Diagnosis not present

## 2022-08-09 DIAGNOSIS — E039 Hypothyroidism, unspecified: Secondary | ICD-10-CM | POA: Insufficient documentation

## 2022-08-09 DIAGNOSIS — Z7901 Long term (current) use of anticoagulants: Secondary | ICD-10-CM | POA: Diagnosis not present

## 2022-08-09 LAB — CBC WITH DIFFERENTIAL/PLATELET
Abs Immature Granulocytes: 0.1 10*3/uL — ABNORMAL HIGH (ref 0.00–0.07)
Basophils Absolute: 0.1 10*3/uL (ref 0.0–0.1)
Basophils Relative: 0 %
Eosinophils Absolute: 0.1 10*3/uL (ref 0.0–0.5)
Eosinophils Relative: 1 %
HCT: 37.6 % (ref 36.0–46.0)
Hemoglobin: 12 g/dL (ref 12.0–15.0)
Immature Granulocytes: 1 %
Lymphocytes Relative: 14 %
Lymphs Abs: 1.9 10*3/uL (ref 0.7–4.0)
MCH: 30.4 pg (ref 26.0–34.0)
MCHC: 31.9 g/dL (ref 30.0–36.0)
MCV: 95.2 fL (ref 80.0–100.0)
Monocytes Absolute: 1.3 10*3/uL — ABNORMAL HIGH (ref 0.1–1.0)
Monocytes Relative: 10 %
Neutro Abs: 10 10*3/uL — ABNORMAL HIGH (ref 1.7–7.7)
Neutrophils Relative %: 74 %
Platelets: 184 10*3/uL (ref 150–400)
RBC: 3.95 MIL/uL (ref 3.87–5.11)
RDW: 14 % (ref 11.5–15.5)
WBC: 13.5 10*3/uL — ABNORMAL HIGH (ref 4.0–10.5)
nRBC: 0 % (ref 0.0–0.2)

## 2022-08-09 LAB — TROPONIN I (HIGH SENSITIVITY)
Troponin I (High Sensitivity): 63 ng/L — ABNORMAL HIGH (ref <18)
Troponin I (High Sensitivity): 55 ng/L — ABNORMAL HIGH (ref <18)

## 2022-08-09 LAB — BASIC METABOLIC PANEL
Anion gap: 7 (ref 5–15)
BUN: 21 mg/dL (ref 8–23)
CO2: 21 mmol/L — ABNORMAL LOW (ref 22–32)
Calcium: 8.9 mg/dL (ref 8.9–10.3)
Chloride: 109 mmol/L (ref 98–111)
Creatinine, Ser: 1.12 mg/dL — ABNORMAL HIGH (ref 0.44–1.00)
GFR, Estimated: 47 mL/min — ABNORMAL LOW (ref >60)
Glucose, Bld: 142 mg/dL — ABNORMAL HIGH (ref 70–99)
Potassium: 4.1 mmol/L (ref 3.5–5.1)
Sodium: 137 mmol/L (ref 135–145)

## 2022-08-09 LAB — CBG MONITORING, ED: Glucose-Capillary: 130 mg/dL — ABNORMAL HIGH (ref 70–99)

## 2022-08-09 MED ORDER — ONDANSETRON HCL 4 MG/2ML IJ SOLN
4.0000 mg | Freq: Once | INTRAMUSCULAR | Status: AC
  Administered 2022-08-09: 4 mg via INTRAVENOUS
  Filled 2022-08-09: qty 2

## 2022-08-09 NOTE — ED Triage Notes (Signed)
Pt arrived via [facility] for lightheadedness/nausea/weakness from home. Pacemaker placed on the 7th, discharged on the 8th. Upon EMS assessment, heart rate varying 50-75bpm, PVCs, few pacer spikes seen. During transport, dizziness resolved, endorses weakness at this time. Pt placed on 2L [place], 400cc NS administered via 20g RAC.   PTA EMS Vitals BP 105/55 HR 50-75 SPO2 98% RR 18

## 2022-08-09 NOTE — Discharge Instructions (Signed)
Your pacemaker seems to be working appropriately.  Your evaluation in the emergency department did not show any sign of any heart injury.  Please keep your follow-up appointment with your cardiologist, return to the emergency department if you have any new or concerning symptoms.

## 2022-08-09 NOTE — Telephone Encounter (Signed)
Patient's son called the answering service this AM asking if patient could tolerate a 5 hour drive. Her son lives about 5 hours away, and patient would like to go to stay with her son. I informed patient that she should not drive, but could be a passenger. Encouraged son to make sure that the seatbelt does not cross the pacemaker site, and to make sure that patient gets up to walk around/wears compression socks to prevent DVTs. Also encouraged son to monitor the pacemaker site for any signs of bleeding.    Margie Billet, PA-C 08/09/2022 9:33 AM

## 2022-08-09 NOTE — ED Provider Notes (Signed)
[facility] Provider Note   CSN: [id number] Arrival date & time: 08/09/22  0028     History  Chief Complaint  Patient presents with   Dizziness   Weakness   Irregular Heart Beat    Samantha [name] is a 89 y.o. female.  The history is provided by the patient.  Dizziness Associated symptoms: weakness   Weakness Associated symptoms: dizziness   She has history of hypertension, hypothyroidism, asthma, hyperlipidemia, pacemaker insertion, paroxysmal atrial fibrillation anticoagulated on apixaban and had permanent pacemaker inserted yesterday.  She came in following an episode of dizziness at home.  She was sitting in a chair when she got hungry.  When she got up, she started feeling lightheaded and nauseous and sweaty.  She got back to her chair and symptoms slowly improved although nausea has persisted.  She did have an episode 5 months ago where she had syncope at home and had negative ED evaluation.  She denies any chest pain, heaviness, tightness, pressure.  She was not aware of any palpitations.   Home Medications Prior to Admission medications   Medication Sig Start Date End Date Taking? Authorizing Provider  albuterol (VENTOLIN HFA) 108 (90 Base) MCG/ACT inhaler Inhale 1-2 puffs into the lungs every 6 (six) hours as needed for wheezing or shortness of breath. 08/01/20   Collene Gobble, MD  apixaban (ELIQUIS) 5 MG TABS tablet Take 1 tablet (5 mg total) by mouth in the morning and at bedtime. 08/10/22   Mamie Levers, NP  Calcium-Magnesium-Zinc (CALCIUM-MAGNESUIUM-ZINC PO) Take 1 tablet by mouth every Monday, Wednesday, and Friday.    [provider]  Cholecalciferol (VITAMIN D3) 50 MCG (2000 UT) CAPS Take 2,000 Units by mouth daily.    [provider]  cyanocobalamin (VITAMIN B12) 1000 MCG tablet Take 1,000 mcg by mouth every Monday, Wednesday, and Friday.    [provider]  denosumab (PROLIA) 60 MG/ML SOSY  injection Inject 60 mg into the skin every 6 (six) months.    [provider]  furosemide (LASIX) 20 MG tablet Take 20 mg by mouth See admin instructions. 20 mg once daily between 1600-1800.    [provider]  levothyroxine (SYNTHROID) 125 MCG tablet Take 125 mcg by mouth daily before breakfast. 08/17/20   [provider]  loratadine (CLARITIN) 10 MG tablet Take 10 mg by mouth daily.    [provider]  lovastatin (MEVACOR) 20 MG tablet Take 20 mg by mouth at bedtime.    [provider]  Lubricants (LUBRICATING JELLY) GEL Place 1 application  vaginally daily.    [provider]  metoprolol succinate (TOPROL-XL) 50 MG 24 hr tablet Take 50 mg by mouth at bedtime.    [provider]  montelukast (SINGULAIR) 10 MG tablet Take 10 mg by mouth at bedtime.    [provider]  Multiple Vitamins-Minerals ([employer] ADULT 50+ EYE HEALTH) CAPS Take 1 capsule by mouth daily.    [provider]  mupirocin ointment (BACTROBAN) 2 % Apply 1 application. topically 2 (two) times daily. Apply to area on foot. Patient taking differently: Apply 1 application  topically 2 (two) times daily. To each nostril 11/05/21   Nyoka Lint, PA-C  Omega-3 Fatty Acids (FISH OIL) 1000 MG CAPS Take 1,000 mg by mouth daily after breakfast.    [provider]  omeprazole (PRILOSEC) 40 MG capsule Take 40 mg by mouth at bedtime.    [provider]  polyethylene glycol (  MIRALAX / GLYCOLAX) packet Take 8.5 g by mouth every other day.    [provider]  potassium chloride (K-DUR) 10 MEQ tablet Take 1 tablet (10 mEq total) by mouth daily. Patient taking differently: Take 10 mEq by mouth See admin instructions. 10 mEq once daily between 1600-1800. 10/24/15   Josue Hector, MD  sodium chloride (OCEAN) 0.65 % SOLN nasal spray Place 1 spray into both nostrils 3 (three) times daily as needed (nasal dryness).    [provider]       Allergies    Erythromycin, Vibra-tab [doxycycline], Benzalkonium chloride, Erythromycin, Fosamax [alendronate], Neo-bacit-poly-lidocaine, Neosporin [neomycin-bacitracin zn-polymyx], Penicillins, and Zithromax [azithromycin]    Review of Systems   Review of Systems  Neurological:  Positive for dizziness and weakness.  All other systems reviewed and are negative.   Physical Exam Updated Vital Signs BP 104/68   Pulse 67   Resp 18   SpO2 100%  Physical Exam Vitals and nursing note reviewed.   89 year old female, resting comfortably and in no acute distress. Vital signs are normal. Oxygen saturation is 100%, which is normal. Head is normocephalic and atraumatic. PERRLA, EOMI. Oropharynx is clear. Neck is nontender and supple without adenopathy or JVD. Back is nontender and there is no CVA tenderness. Lungs are clear without rales, wheezes, or rhonchi. Chest is nontender.  Surgical site from pacemaker insertion present without signs of infection. Heart has a somewhat irregular rhythm without murmur. Abdomen is soft, flat, nontender. Extremities have no cyanosis or edema, full range of motion is present. Skin is warm and dry without rash. Neurologic: Mental status is normal, cranial nerves are intact, moves all extremities equally.  ED Results / Procedures / Treatments   Labs (all labs ordered are listed, but only abnormal results are displayed) Labs Reviewed - No data to display  EKG EKG Interpretation  Date/Time:  Saturday August 09 2022 00:37:34 EST Ventricular Rate:  116 PR Interval:    QRS Duration: 117 QT Interval:  399 QTC Calculation: 533 R Axis:   177 Text Interpretation: Afib/flut and V-paced complexes No further rhythm analysis attempted due to paced rhythm Nonspecific intraventricular conduction delay Low voltage, precordial leads Borderline T abnormalities, inferior leads When compared with ECG of 08/08/2022, Atrial fibrillation has replaced Sinus rhythm Confirmed  by Delora Fuel ([id number]) on 08/09/2022 12:43:49 AM  Radiology DG Chest 2 View  Result Date: 08/08/2022 CLINICAL DATA:  [id number].  Cardiac device in-situ. EXAM: CHEST - 2 VIEW COMPARISON:  Portable chest 08/05/2022 FINDINGS: The patient's left arm was in the field limiting the lateral view. Left chest dual lead pacing system has been inserted. One of the wires terminates in the right atrium and the other in the right ventricle. There is no pneumothorax. There are low lung volumes with no evidence of focal pneumonia or pleural collections. There is stable cardiomegaly without CHF. The mediastinum is normally outlined. There is osteopenia and mild thoracic kyphodextroscoliosis. IMPRESSION: 1. No evidence of acute cardiopulmonary disease. Stable cardiomegaly. 2. Left chest dual lead pacing system inserted. Electronically Signed   By: Telford Nab M.D.   On: 08/08/2022 07:20   EP PPM/ICD IMPLANT  Result Date: 08/07/2022 SURGEON:  Allegra Lai, MD   PREPROCEDURE DIAGNOSIS:  tachy/brady syndrome   POSTPROCEDURE DIAGNOSIS:  tachy/brady syndrome    PROCEDURES:  1. Pacemaker implantation.   INTRODUCTION:  Samantha [name] is a 89 y.o. female with a history of bradycardia who presents today for pacemaker implantation.  The patient reports  intermittent episodes of dizziness over the past few months.  No reversible causes have been identified.  The patient therefore presents today for pacemaker implantation.   DESCRIPTION OF PROCEDURE:  Informed written consent was obtained, and  the patient was brought to the electrophysiology lab in a fasting state.  The patient required no sedation for the procedure today.  The patients left chest was prepped and draped in the usual sterile fashion by the EP lab staff. The skin overlying the left deltopectoral region was infiltrated with lidocaine for local analgesia.  A 4-cm incision was made over the left deltopectoral region.  A left subcutaneous pacemaker pocket was fashioned using a  combination of sharp and blunt dissection. Electrocautery was required to assure hemostasis.  RA/RV Lead Placement: The left axillary vein was therefore cannulated.  Through the left axillary vein, a Abbott Ultipace 1231-52  (serial number  [id number]) right atrial lead and an Abbott Ultipace 1231-65 (serial number  [id number]) right ventricular lead were advanced with fluoroscopic visualization into the right atrial appendage and right ventricular apex positions respectively.  Initial atrial lead P- waves measured 2.3 mV with impedance of 453 ohms and a threshold of 0.9 V at 0.5 msec.  Right ventricular lead R-waves measured 9.6 mV with an impedance of 731 ohms and a threshold of 0.8 V at 0.5 msec.  Both leads were secured to the pectoralis fascia using #2-0 silk over the suture sleeves. Device Placement:  The leads were then connected to an [place]  (serial number  [id number] ) pacemaker.  The pocket was irrigated with copious gentamicin solution.  The pacemaker was then placed into the pocket.  The pocket was then closed in 3 layers with 2.0 Vicryl suture for the 3.0 Vicryl suture subcutaneous and subcuticular layers.  Steri-  Strips and a sterile dressing were then applied. EBL<64m.  There were no early apparent complications.   CONCLUSIONS:  1. Successful implantation of a Abbott Assurity [id number]-chamber pacemaker for symptomatic bradycardia  2. No early apparent complications.       Will CCurt Bears MD 08/07/2022 5:43 PM   Procedures Procedures  Cardiac monitor shows underlying atrial fibrillation with ventricular paced beats, per my interpretation.  Medications Ordered in ED Medications - No data to display  ED Course/ Medical Decision Making/ A&P                             Medical Decision Making Amount and/or Complexity of Data Reviewed Labs: ordered.  Risk Prescription drug management.   Episode of dizziness and lightheadedness which sounds like a vasovagal episode.  Need to  consider atypical ACS.  Pacemaker appears to be functioning normally, but will be interrogated to make sure there is no pacemaker dysfunction.  I have reviewed her past records and note ED visit on 03/30/2022 for syncope with negative ED evaluation.  Pacemaker insertion on 08/07/2022 for complete heart block.  I have reviewed and interpreted her laboratory tests, and my interpretation is mild elevation of troponin likely secondary to pacemaker insertion yesterday but no evidence of ACS, minimal elevation in creatinine not felt to be clinically significant, mild leukocytosis which is nonspecific.  At this point, still waiting for pacemaker interrogation.  Pacemaker representative still has not arrived, but patient has had excellent pacemaker function noted on her cardiac monitor.  I discussed the case with Dr. KHumphrey Rollsof cardiology service who feels that she does not need a formal consultation.  Since pacemaker seems to be functioning appropriately and she is has had no further symptoms in the ED, he feels she is safe for discharge with follow-up with her electrophysiologist.  I have informed patient of this, recommended return if she has any new or concerning symptoms.  Final Clinical Impression(s) / ED Diagnoses Final diagnoses:  Dizziness  Elevated troponin    Rx / DC Orders ED Discharge Orders     None         Delora Fuel, MD [id number] [phone]

## 2022-08-09 NOTE — ED Notes (Signed)
DC instructions reviewed with pt. PT verbalized understanding. PT DC °

## 2022-08-09 NOTE — ED Notes (Signed)
Left message for sales rep for Abbott pace maker, Rep on call for this area is Ericka Pontiff, will call with eta.

## 2022-08-12 DIAGNOSIS — J4530 Mild persistent asthma, uncomplicated: Secondary | ICD-10-CM | POA: Diagnosis not present

## 2022-08-12 DIAGNOSIS — I509 Heart failure, unspecified: Secondary | ICD-10-CM | POA: Diagnosis not present

## 2022-08-12 DIAGNOSIS — K219 Gastro-esophageal reflux disease without esophagitis: Secondary | ICD-10-CM | POA: Diagnosis not present

## 2022-08-12 DIAGNOSIS — I480 Paroxysmal atrial fibrillation: Secondary | ICD-10-CM | POA: Diagnosis not present

## 2022-08-12 DIAGNOSIS — E7800 Pure hypercholesterolemia, unspecified: Secondary | ICD-10-CM | POA: Diagnosis not present

## 2022-08-12 DIAGNOSIS — E039 Hypothyroidism, unspecified: Secondary | ICD-10-CM | POA: Diagnosis not present

## 2022-08-18 ENCOUNTER — Ambulatory Visit: Payer: Medicare Other | Admitting: Podiatry

## 2022-08-20 ENCOUNTER — Telehealth: Payer: Self-pay | Admitting: Cardiovascular Disease

## 2022-08-20 NOTE — Telephone Encounter (Signed)
Pt c/o swelling: STAT is pt has developed SOB within 24 hours  If swelling, where is the swelling located? Ankles , been this way since 08-14-22  How much weight have you gained and in what time span? Fluciating between 142 to 147  Have you gained 3 pounds in a day or 5 pounds in a week?   Do you have a log of your daily weights (if so, list)?   Are you currently taking a fluid pill? yes  Are you currently SOB?  a little  Have you traveled recently? just got from [facility]

## 2022-08-20 NOTE — Telephone Encounter (Signed)
Samantha Hector, MD  to Me     08/20/22  5:20 PM Ok to take extra another 2 days  Patient notified.  She will let us know if she continues to have problems with swelling

## 2022-08-20 NOTE — Telephone Encounter (Signed)
I spoke with patient. She had pacemaker put in on 3/7.  After this she went to stay with family. Was in the car for about 4.5 hours.  Got out several times during the trip and walked around.  She developed swelling in her feet and ankles on 3/14.  Left slightly greater than right.  No swelling in calf area.  She took extra 20 mg lasix this past Thursday, Friday and Saturday.  Swelling went down some.  She reports the following weights Today- 147 lb 3/19-146 lb 3/18-145 lb 3/17-144 lb Is watching salt intake and family is preparing meals at home.  She states Dr Johnsie Cancel told her she could take one extra lasix as needed for swelling but not to take for more than 3 days.  She took last extra dose on Saturday and is asking if OK to take extra dose now.  I told her it would be OK to take one extra dose for next 2 days and message would be sent to Dr Johnsie Cancel for further instructions.

## 2022-08-21 ENCOUNTER — Ambulatory Visit: Payer: Medicare HMO | Attending: Internal Medicine

## 2022-08-21 DIAGNOSIS — I442 Atrioventricular block, complete: Secondary | ICD-10-CM

## 2022-08-21 LAB — CUP PACEART INCLINIC DEVICE CHECK
Battery Remaining Longevity: 102 mo
Battery Voltage: 3.08 V
Brady Statistic RA Percent Paced: 0.02 %
Brady Statistic RV Percent Paced: 41 %
Date Time Interrogation Session: 20240321153705
Implantable Lead Connection Status: 753985
Implantable Lead Connection Status: 753985
Implantable Lead Implant Date: 20240307
Implantable Lead Implant Date: 20240307
Implantable Lead Location: 753859
Implantable Lead Location: 753860
Implantable Pulse Generator Implant Date: 20240307
Lead Channel Impedance Value: 362.5 Ohm
Lead Channel Impedance Value: 525 Ohm
Lead Channel Pacing Threshold Amplitude: 0.75 V
Lead Channel Pacing Threshold Amplitude: 0.75 V
Lead Channel Pacing Threshold Pulse Width: 0.5 ms
Lead Channel Pacing Threshold Pulse Width: 0.5 ms
Lead Channel Sensing Intrinsic Amplitude: 3.8 mV
Lead Channel Sensing Intrinsic Amplitude: 8.4 mV
Lead Channel Setting Pacing Amplitude: 3.5 V
Lead Channel Setting Pacing Amplitude: 3.5 V
Lead Channel Setting Pacing Pulse Width: 0.5 ms
Lead Channel Setting Sensing Sensitivity: 2 mV
Pulse Gen Model: 2272
Pulse Gen Serial Number: 8163768

## 2022-08-21 NOTE — Progress Notes (Signed)
Wound check appointment. Steri-strips removed. Wound without redness or edema. Incision edges approximated, wound well healed. Normal device function. Thresholds, sensing, and impedances consistent with implant measurements. Device programmed at 3.5V/auto capture programmed on for extra safety margin until 3 month visit. Histogram distribution appropriate for patient and level of activity. AT/AF burden 98% with peak VR 183 bpm. Patient educated about wound care, arm mobility, lifting restrictions. ROV in 3 months with implanting physician.

## 2022-08-21 NOTE — Patient Instructions (Signed)

## 2022-08-22 DIAGNOSIS — R55 Syncope and collapse: Secondary | ICD-10-CM | POA: Diagnosis not present

## 2022-08-22 DIAGNOSIS — Z8679 Personal history of other diseases of the circulatory system: Secondary | ICD-10-CM | POA: Diagnosis not present

## 2022-09-01 ENCOUNTER — Ambulatory Visit: Payer: Medicare HMO | Admitting: Podiatry

## 2022-09-01 ENCOUNTER — Encounter: Payer: Self-pay | Admitting: Podiatry

## 2022-09-01 DIAGNOSIS — M79676 Pain in unspecified toe(s): Secondary | ICD-10-CM | POA: Diagnosis not present

## 2022-09-01 DIAGNOSIS — D689 Coagulation defect, unspecified: Secondary | ICD-10-CM

## 2022-09-01 DIAGNOSIS — B351 Tinea unguium: Secondary | ICD-10-CM | POA: Diagnosis not present

## 2022-09-01 DIAGNOSIS — I739 Peripheral vascular disease, unspecified: Secondary | ICD-10-CM

## 2022-09-01 NOTE — Progress Notes (Signed)
  Subjective:  Patient ID: Samantha [name], female    DOB: [birth year],  MRN: [id number]  Samantha [name] presents to clinic today for at risk foot care. Patient has h/o PAD and painful thick toenails that are difficult to trim. Pain interferes with ambulation. Aggravating factors include wearing enclosed shoe gear. Pain is relieved with periodic professional debridement.  Chief Complaint  Patient presents with   Follow-up    Patient is here today for her routine foot care.   New problem(s): None.   PCP is Charlane Ferretti, MD.  Allergies  Allergen Reactions   Erythromycin Diarrhea and Rash   Vibra-Tab [Doxycycline] Hives   Benzalkonium Chloride Rash   Erythromycin Rash   Fosamax [Alendronate] Nausea Only and Other (See Comments)    Severe stomach cramps   Neo-Bacit-Poly-Lidocaine Rash   Neomy-Bacit-Polymyx-Pramoxine Rash   Neosporin [Neomycin-Bacitracin Zn-Polymyx] Rash   Penicillins Rash    DID THE REACTION INVOLVE: Swelling of the face/tongue/throat, SOB, or low BP? N Sudden or severe rash/hives, skin peeling, or the inside of the mouth or nose? Y Did it require medical treatment? N When did it last happen?      Childhood If all above answers are "NO", may proceed with cephalosporin use.   Zithromax [Azithromycin] Diarrhea    Review of Systems: Negative except as noted in the HPI.  Objective:   There were no vitals filed for this visit.  Samantha [name] is a pleasant 89 y.o. female WD, WN in NAD. AAO x 3. Vascular Capillary refill time to digits immediate b/l. Palpable DP pulse(s) b/l LE. Diminished PT pulse(s) b/l LE. Pedal hair sparse. No pain with calf compression b/l. Varicosities present b/l. No cyanosis or clubbing noted b/l LE.  Neurologic Normal speech. Oriented to person, place, and time. Protective sensation intact 5/5 intact bilaterally with 10g monofilament b/l. Vibratory sensation intact b/l.  Dermatologic Pedal integument with normal turgor, texture and tone BLE. No  open wounds b/l LE. No interdigital macerations noted b/l LE. Toenails 1-5 b/l elongated, discolored, dystrophic, thickened, crumbly with subungual debris and tenderness to dorsal palpation.   No hyperkeratotic nor porokeratotic lesions present on today's visit.  Orthopedic: Muscle strength 5/5 to all lower extremity muscle groups bilaterally. Tailor's bunion deformity noted b/l LE. Hammertoe deformity noted 2-5 b/l. Utilizes cane for ambulation assistance.   Radiographs: None  Assessment/Plan: 1. Pain due to onychomycosis of toenail   2. Blood clotting disorder   3. PAD (peripheral artery disease)     No orders of the defined types were placed in this encounter.   -Consent given for treatment as described below: -Continue supportive shoe gear daily. -Toenails 1-5 b/l were debrided in length and girth with sterile nail nippers and dremel without iatrogenic bleeding.  -Patient/POA to call should there be question/concern in the interim.   No follow-ups on file.  Lorenda Peck, DPM

## 2022-09-11 DIAGNOSIS — H6123 Impacted cerumen, bilateral: Secondary | ICD-10-CM | POA: Diagnosis not present

## 2022-09-15 DIAGNOSIS — I480 Paroxysmal atrial fibrillation: Secondary | ICD-10-CM | POA: Diagnosis not present

## 2022-09-15 DIAGNOSIS — E039 Hypothyroidism, unspecified: Secondary | ICD-10-CM | POA: Diagnosis not present

## 2022-09-15 DIAGNOSIS — I509 Heart failure, unspecified: Secondary | ICD-10-CM | POA: Diagnosis not present

## 2022-09-15 DIAGNOSIS — K219 Gastro-esophageal reflux disease without esophagitis: Secondary | ICD-10-CM | POA: Diagnosis not present

## 2022-09-15 DIAGNOSIS — M810 Age-related osteoporosis without current pathological fracture: Secondary | ICD-10-CM | POA: Diagnosis not present

## 2022-09-15 DIAGNOSIS — E7800 Pure hypercholesterolemia, unspecified: Secondary | ICD-10-CM | POA: Diagnosis not present

## 2022-09-15 DIAGNOSIS — J4530 Mild persistent asthma, uncomplicated: Secondary | ICD-10-CM | POA: Diagnosis not present

## 2022-09-16 DIAGNOSIS — L820 Inflamed seborrheic keratosis: Secondary | ICD-10-CM | POA: Diagnosis not present

## 2022-09-16 DIAGNOSIS — L308 Other specified dermatitis: Secondary | ICD-10-CM | POA: Diagnosis not present

## 2022-09-16 DIAGNOSIS — D1801 Hemangioma of skin and subcutaneous tissue: Secondary | ICD-10-CM | POA: Diagnosis not present

## 2022-09-16 DIAGNOSIS — L821 Other seborrheic keratosis: Secondary | ICD-10-CM | POA: Diagnosis not present

## 2022-09-16 DIAGNOSIS — L570 Actinic keratosis: Secondary | ICD-10-CM | POA: Diagnosis not present

## 2022-09-16 DIAGNOSIS — D0439 Carcinoma in situ of skin of other parts of face: Secondary | ICD-10-CM | POA: Diagnosis not present

## 2022-09-18 ENCOUNTER — Telehealth: Payer: Self-pay

## 2022-09-18 NOTE — Telephone Encounter (Signed)
...     Pre-operative Risk Assessment    Patient Name: Samantha [name]  DOB: [birth year] MRN: [id number]      Request for Surgical Clearance    Procedure:   INFUSION  Date of Surgery:  Clearance TBD                                 Surgeon:  Loreli Slot Surgeon's Group or Practice Name:  [facility] Phone number:  [phone] Fax number:  [phone]   Type of Clearance Requested:   NEED CLEARANCE TO RESUME PROLIA   Type of Anesthesia:  None    Additional requests/questions:    [name]   09/18/2022, 1:41 PM

## 2022-09-18 NOTE — Telephone Encounter (Signed)
   Primary Cardiologist: Charlton Haws, MD  Chart reviewed as part of pre-operative protocol coverage. Samantha [name] would be at acceptable risk to proceed with Prolia injection. She is 6 weeks post pacemaker implant and is outside the window of activity restrictions.   I will route this recommendation to the requesting party via Epic fax function and remove from pre-op pool.  Please call with questions.  Levi Aland, NP-C  09/18/2022, 4:10 PM [address], [address] Office [phone] Fax [phone]

## 2022-09-19 DIAGNOSIS — Z8679 Personal history of other diseases of the circulatory system: Secondary | ICD-10-CM | POA: Diagnosis not present

## 2022-09-19 DIAGNOSIS — Z950 Presence of cardiac pacemaker: Secondary | ICD-10-CM | POA: Diagnosis not present

## 2022-09-19 DIAGNOSIS — M810 Age-related osteoporosis without current pathological fracture: Secondary | ICD-10-CM | POA: Diagnosis not present

## 2022-09-19 DIAGNOSIS — I700 Atherosclerosis of aorta: Secondary | ICD-10-CM | POA: Diagnosis not present

## 2022-09-19 DIAGNOSIS — D6859 Other primary thrombophilia: Secondary | ICD-10-CM | POA: Diagnosis not present

## 2022-09-23 DIAGNOSIS — M810 Age-related osteoporosis without current pathological fracture: Secondary | ICD-10-CM | POA: Diagnosis not present

## 2022-09-30 ENCOUNTER — Telehealth: Payer: Self-pay | Admitting: Cardiovascular Disease

## 2022-09-30 NOTE — Telephone Encounter (Signed)
Pt c/o swelling: STAT is pt has developed SOB within 24 hours  How much weight have you gained and in what time span?  No weight gain--patient states this morning she weighed 2 lbs less than yesterday  If swelling, where is the swelling located?  Ankles, and left foot   Are you currently taking a fluid pill?  Yes Furosemide 20 MG once daily in the early evening   Are you currently SOB?  No   Do you have a log of your daily weights (if so, list)?  Weight remains around 144 lbs   Have you gained 3 pounds in a day or 5 pounds in a week?  No   Have you traveled recently?  No, but patient mentions that she plans on traveling in May

## 2022-10-01 NOTE — Telephone Encounter (Signed)
Called patient back with Dr. Fabio Bering advisement. Patient stated her PCP told her to reduce her salt in her diet and to elevate her legs to help with swelling. Informed patient that we have a Care Guide Coach that she might be interested in meeting with when she gets back from her trip. Gave Amy Lee's name to patient and will send patient's  information to our team.

## 2022-10-01 NOTE — Telephone Encounter (Signed)
Called patient back about her message. Patient complaining of left leg swelling, confirmed it is not the right leg, due to her history. Patient stated she noticed swelling 2 weeks ago. Patient denies any pain in left leg. Patient stated the swelling goes down in the evenings. Informed patient that per Dr. Eden Emms last office visit he stated " peripheral edema: non cardiac". Patient stated she was concerned since she had recently had a procedure to have pacemaker placed. Patient is currently taking lasix 20 mg by mouth in the evening. Patient has seen her PCP in the last two weeks. Patient stated she told her PCP, but they did not have any recommendations at the time. Encouraged patient to call her PCP and let them know that she is still having swelling. Informed patient that a message would be sent to Dr. Eden Emms for advisement as well.

## 2022-10-08 ENCOUNTER — Telehealth: Payer: Self-pay

## 2022-10-08 DIAGNOSIS — Z0000 Encounter for general adult medical examination without abnormal findings: Secondary | ICD-10-CM

## 2022-10-08 NOTE — Telephone Encounter (Signed)
Called patient per health coaching referral for healthy eating habits. Patient is interested in support with implementing a low-sodium diet and understanding edema. Patient was provided contact information to call back once she returned home after leaving town to schedule initial health coaching session. Patient was mailed a packet with information on low-sodium and DASH diets, along with meal planning sheets, information on reading food labels, and the health coaching welcome letter/code of ethics.   Renaee Munda, MS, ERHD, [facility]  Care Guide, Health & Wellness Coach [address] [place] [address] Telephone: [phone] Email: [email or web address]

## 2022-10-21 ENCOUNTER — Telehealth: Payer: Self-pay | Admitting: Cardiovascular Disease

## 2022-10-21 NOTE — Telephone Encounter (Signed)
Called patient back about message. Patient stated she is not having any pain, and she only has swelling in left ankle. Informed patient of Dr. Fabio Bering last office note it stated " Peripheral edema: non cardiac continue low dose lasix " Informed patient that she should follow-up with her PCP. Patient stated she was out of town, until the first of June. Patient stated that the swelling does go down at night. Patient agreed to call PCP. Encouraged patient to keep low salt diet, elevate legs, and try to ambulate often.

## 2022-10-21 NOTE — Telephone Encounter (Signed)
Pt c/o swelling: STAT is pt has developed SOB within 24 hours  How much weight have you gained and in what time span? "That's what is confusing me, weight is fluctuating between 143-145"  If swelling, where is the swelling located? "Left ankle and its consistent now for about a week. Swelling at night and then fine in the morning"  Are you currently taking a fluid pill? Yes  Are you currently SOB? No  Do you have a log of your daily weights (if so, list)? No  Have you gained 3 pounds in a day or 5 pounds in a week?   Have you traveled recently? Traveled to see her son on 5/12

## 2022-10-31 ENCOUNTER — Telehealth: Payer: Self-pay

## 2022-10-31 DIAGNOSIS — Z0000 Encounter for general adult medical examination without abnormal findings: Secondary | ICD-10-CM

## 2022-10-31 NOTE — Telephone Encounter (Signed)
Called patient to follow up on health coaching referral after expressing interest in participating in the program but did not schedule an appointment due to going out of town. Patient did not answer. Left a message to return call to schedule initial appointment.    Renaee Munda, MS, ERHD, [facility]  Care Guide, Health & Wellness Coach [address] [place] [address] Telephone: [phone] Email: [email or web address]

## 2022-11-07 ENCOUNTER — Ambulatory Visit (INDEPENDENT_AMBULATORY_CARE_PROVIDER_SITE_OTHER): Payer: Medicare HMO

## 2022-11-07 DIAGNOSIS — I447 Left bundle-branch block, unspecified: Secondary | ICD-10-CM | POA: Diagnosis not present

## 2022-11-07 LAB — CUP PACEART REMOTE DEVICE CHECK
Battery Remaining Longevity: 77 mo
Battery Remaining Percentage: 95.5 %
Battery Voltage: 3.01 V
Brady Statistic AP VP Percent: 0 %
Brady Statistic AP VS Percent: 0 %
Brady Statistic AS VP Percent: 0 %
Brady Statistic AS VS Percent: 0 %
Brady Statistic RA Percent Paced: 1 %
Brady Statistic RV Percent Paced: 48 %
Date Time Interrogation Session: 20240607020022
Implantable Lead Connection Status: 753985
Implantable Lead Connection Status: 753985
Implantable Lead Implant Date: 20240307
Implantable Lead Implant Date: 20240307
Implantable Lead Location: 753859
Implantable Lead Location: 753860
Implantable Pulse Generator Implant Date: 20240307
Lead Channel Impedance Value: 360 Ohm
Lead Channel Impedance Value: 530 Ohm
Lead Channel Pacing Threshold Amplitude: 0.5 V
Lead Channel Pacing Threshold Amplitude: 0.75 V
Lead Channel Pacing Threshold Pulse Width: 0.5 ms
Lead Channel Pacing Threshold Pulse Width: 0.5 ms
Lead Channel Sensing Intrinsic Amplitude: 3.8 mV
Lead Channel Sensing Intrinsic Amplitude: 4.8 mV
Lead Channel Setting Pacing Amplitude: 3.5 V
Lead Channel Setting Pacing Amplitude: 3.5 V
Lead Channel Setting Pacing Pulse Width: 0.5 ms
Lead Channel Setting Sensing Sensitivity: 2 mV
Pulse Gen Model: 2272
Pulse Gen Serial Number: 8163768

## 2022-11-10 ENCOUNTER — Ambulatory Visit: Payer: Medicare HMO | Admitting: Podiatry

## 2022-11-10 ENCOUNTER — Encounter: Payer: Self-pay | Admitting: Podiatry

## 2022-11-10 ENCOUNTER — Telehealth: Payer: Self-pay

## 2022-11-10 DIAGNOSIS — B351 Tinea unguium: Secondary | ICD-10-CM | POA: Diagnosis not present

## 2022-11-10 DIAGNOSIS — M79676 Pain in unspecified toe(s): Secondary | ICD-10-CM

## 2022-11-10 DIAGNOSIS — I739 Peripheral vascular disease, unspecified: Secondary | ICD-10-CM

## 2022-11-10 DIAGNOSIS — D689 Coagulation defect, unspecified: Secondary | ICD-10-CM | POA: Diagnosis not present

## 2022-11-10 DIAGNOSIS — Z0000 Encounter for general adult medical examination without abnormal findings: Secondary | ICD-10-CM

## 2022-11-10 NOTE — Telephone Encounter (Signed)
Called patient to determine if she was still interested in participating in health coaching to improve healthy eating habits. Left patient a message to return call with update.    Renaee Munda, MS, ERHD, [facility]  Care Guide, Health & Wellness Coach [address] [place] [address] Telephone: [phone] Email: [email or web address]

## 2022-11-10 NOTE — Progress Notes (Signed)
  Subjective:  Patient ID: Samantha [name], female    DOB: 08/08/1929,  MRN: [id number]  Samantha [name] presents to clinic today for at risk foot care. Patient has h/o PAD and painful thick toenails that are difficult to trim. Pain interferes with ambulation. Aggravating factors include wearing enclosed shoe gear. Pain is relieved with periodic professional debridement.  Chief Complaint  Patient presents with   Nail Problem     Routine foot care   New problem(s): None.   PCP is Thana Ates, MD.  Allergies  Allergen Reactions   Erythromycin Diarrhea and Rash   Vibra-Tab [Doxycycline] Hives   Benzalkonium Chloride Rash   Erythromycin Rash   Fosamax [Alendronate] Nausea Only and Other (See Comments)    Severe stomach cramps   Neo-Bacit-Poly-Lidocaine Rash   Neomy-Bacit-Polymyx-Pramoxine Rash   Neosporin [Neomycin-Bacitracin Zn-Polymyx] Rash   Penicillins Rash    DID THE REACTION INVOLVE: Swelling of the face/tongue/throat, SOB, or low BP? N Sudden or severe rash/hives, skin peeling, or the inside of the mouth or nose? Y Did it require medical treatment? N When did it last happen?      Childhood If all above answers are "NO", may proceed with cephalosporin use.   Zithromax [Azithromycin] Diarrhea    Review of Systems: Negative except as noted in the HPI.  Objective:   There were no vitals filed for this visit.  Samantha [name] is a pleasant 89 y.o. female WD, WN in NAD. AAO x 3. Vascular Capillary refill time to digits immediate b/l. Palpable DP pulse(s) b/l LE. Diminished PT pulse(s) b/l LE. Pedal hair sparse. No pain with calf compression b/l. Varicosities present b/l. No cyanosis or clubbing noted b/l LE.  Neurologic Normal speech. Oriented to person, place, and time. Protective sensation intact 5/5 intact bilaterally with 10g monofilament b/l. Vibratory sensation intact b/l.  Dermatologic Pedal integument with normal turgor, texture and tone BLE. No open wounds b/l LE. No  interdigital macerations noted b/l LE. Toenails 1-5 b/l elongated, discolored, dystrophic, thickened, crumbly with subungual debris and tenderness to dorsal palpation.   No hyperkeratotic nor porokeratotic lesions present on today's visit.  Orthopedic: Muscle strength 5/5 to all lower extremity muscle groups bilaterally. Tailor's bunion deformity noted b/l LE. Hammertoe deformity noted 2-5 b/l. Utilizes cane for ambulation assistance.   Radiographs: None  Assessment/Plan: 1. Pain due to onychomycosis of toenail   2. Blood clotting disorder (HCC)   3. PAD (peripheral artery disease) (HCC)      No orders of the defined types were placed in this encounter.   -Consent given for treatment as described below: -Continue supportive shoe gear daily. -Toenails 1-5 b/l were debrided in length and girth with sterile nail nippers and dremel without iatrogenic bleeding.  -Patient/POA to call should there be question/concern in the interim.   Return in about 3 months (around 02/10/2023) for rfc.  Louann Sjogren, DPM

## 2022-11-13 ENCOUNTER — Encounter: Payer: Medicare HMO | Admitting: Internal Medicine

## 2022-11-17 DIAGNOSIS — I480 Paroxysmal atrial fibrillation: Secondary | ICD-10-CM | POA: Diagnosis not present

## 2022-11-17 DIAGNOSIS — E7800 Pure hypercholesterolemia, unspecified: Secondary | ICD-10-CM | POA: Diagnosis not present

## 2022-11-17 DIAGNOSIS — K219 Gastro-esophageal reflux disease without esophagitis: Secondary | ICD-10-CM | POA: Diagnosis not present

## 2022-11-17 DIAGNOSIS — I509 Heart failure, unspecified: Secondary | ICD-10-CM | POA: Diagnosis not present

## 2022-11-17 DIAGNOSIS — E039 Hypothyroidism, unspecified: Secondary | ICD-10-CM | POA: Diagnosis not present

## 2022-11-17 DIAGNOSIS — J4530 Mild persistent asthma, uncomplicated: Secondary | ICD-10-CM | POA: Diagnosis not present

## 2022-11-17 DIAGNOSIS — M810 Age-related osteoporosis without current pathological fracture: Secondary | ICD-10-CM | POA: Diagnosis not present

## 2022-11-18 ENCOUNTER — Encounter: Payer: Self-pay | Admitting: Internal Medicine

## 2022-11-18 ENCOUNTER — Ambulatory Visit: Payer: Medicare HMO | Attending: Internal Medicine | Admitting: Internal Medicine

## 2022-11-18 VITALS — BP 108/60 | HR 88 | Ht 61.0 in | Wt 140.0 lb

## 2022-11-18 DIAGNOSIS — I442 Atrioventricular block, complete: Secondary | ICD-10-CM

## 2022-11-18 NOTE — Patient Instructions (Addendum)
Medication Instructions:  Your physician recommends that you continue on your current medications as directed. Please refer to the Current Medication list given to you today.  *If you need a refill on your cardiac medications before your next appointment, please call your pharmacy*  Lab Work: If you have labs (blood work) drawn today and your tests are completely normal, you will receive your results only by: MyChart Message (if you have MyChart) OR A paper copy in the mail If you have any lab test that is abnormal or we need to change your treatment, we will call you to review the results.  Testing/Procedures: None ordered today.  Follow-Up: At [facility], you and your health needs are our priority.  As part of our continuing mission to provide you with exceptional heart care, we have created designated Provider Care Teams.  These Care Teams include your primary Cardiologist (physician) and Advanced Practice Providers (APPs -  Physician Assistants and Nurse Practitioners) who all work together to provide you with the care you need, when you need it.  We recommend signing up for the patient portal called "MyChart".  Sign up information is provided on this After Visit Summary.  MyChart is used to connect with patients for Virtual Visits (Telemedicine).  Patients are able to view lab/test results, encounter notes, upcoming appointments, etc.  Non-urgent messages can be sent to your provider as well.   To learn more about what you can do with MyChart, go to [email or web address].    Your next appointment:   1 year(s)  Provider:   Dr. Elberta Fortis

## 2022-11-18 NOTE — Progress Notes (Signed)
HPI Samantha [name] presents today for ongoing evaluation of syncope, s/p PPM insertion. 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 The patient also has a h/o atrial fib and is on systemic anti-coagulation. She has not had syncope since her PPM was placed by Dr. Elberta Fortis. Not quite sure why she is on my schedule.  Allergies  Allergen Reactions   Erythromycin Diarrhea and Rash   Vibra-Tab [Doxycycline] Hives   Benzalkonium Chloride Rash   Erythromycin Rash   Fosamax [Alendronate] Nausea Only and Other (See Comments)    Severe stomach cramps   Neo-Bacit-Poly-Lidocaine Rash   Neomy-Bacit-Polymyx-Pramoxine Rash   Neosporin [Neomycin-Bacitracin Zn-Polymyx] Rash   Penicillins Rash    DID THE REACTION INVOLVE: Swelling of the face/tongue/throat, SOB, or low BP? N Sudden or severe rash/hives, skin peeling, or the inside of the mouth or nose? Y Did it require medical treatment? N When did it last happen?      Childhood If all above answers are "NO", may proceed with cephalosporin use.   Zithromax [Azithromycin] Diarrhea     Current Outpatient Medications  Medication Sig Dispense Refill   albuterol (VENTOLIN HFA) 108 (90 Base) MCG/ACT inhaler Inhale 1-2 puffs into the lungs every 6 (six) hours as needed for wheezing or shortness of breath. 18 g 0   apixaban (ELIQUIS) 5 MG TABS tablet Take 1 tablet (5 mg total) by mouth in the morning and at bedtime. 60 tablet    Calcium-Magnesium-Zinc (CALCIUM-MAGNESUIUM-ZINC PO) Take 1 tablet by mouth every Monday, Wednesday, and Friday.     Cholecalciferol (VITAMIN D3) 50 MCG (2000 UT) CAPS Take 2,000 Units by mouth daily.     cyanocobalamin (VITAMIN B12) 1000 MCG tablet Take 1,000 mcg by mouth every Monday, Wednesday, and Friday.     denosumab (PROLIA) 60 MG/ML SOSY injection Inject 60 mg into the skin every 6 (six) months.     furosemide (LASIX) 20 MG tablet Take 20 mg by mouth See admin instructions. 20 mg once daily between 1600-1800.      levothyroxine (SYNTHROID) 125 MCG tablet Take 125 mcg by mouth daily before breakfast.     loratadine (CLARITIN) 10 MG tablet Take 10 mg by mouth daily.     lovastatin (MEVACOR) 20 MG tablet Take 20 mg by mouth at bedtime.     Lubricants (LUBRICATING JELLY) GEL Place 1 application  vaginally daily.     metoprolol succinate (TOPROL-XL) 50 MG 24 hr tablet Take 50 mg by mouth at bedtime.     montelukast (SINGULAIR) 10 MG tablet Take 10 mg by mouth at bedtime.     Multiple Vitamins-Minerals ([employer] ADULT 50+ EYE HEALTH) CAPS Take 1 capsule by mouth daily.     mupirocin ointment (BACTROBAN) 2 % Apply 1 application. topically 2 (two) times daily. Apply to area on foot. (Patient taking differently: Apply 1 application  topically 2 (two) times daily. To each nostril) 15 g 0   Omega-3 Fatty Acids (FISH OIL) 1000 MG CAPS Take 1,000 mg by mouth daily after breakfast.     omeprazole (PRILOSEC) 40 MG capsule Take 40 mg by mouth at bedtime.     polyethylene glycol (MIRALAX / GLYCOLAX) packet Take 8.5 g by mouth every other day.     potassium chloride (K-DUR) 10 MEQ tablet Take 1 tablet (10 mEq total) by mouth daily. (Patient taking differently: Take 10 mEq by mouth See admin instructions. 10 mEq once daily between 1600-1800.) 90 tablet 3   sodium chloride (  OCEAN) 0.65 % SOLN nasal spray Place 1 spray into both nostrils 3 (three) times daily as needed (nasal dryness).     No current facility-administered medications for this visit.     Past Medical History:  Diagnosis Date   Arthritis    Asthma    GERD (gastroesophageal reflux disease)    High cholesterol    Hypothyroidism    LBBB (left bundle branch block)    Ventricular ectopy     ROS:   All systems reviewed and negative except as noted in the HPI.   Past Surgical History:  Procedure Laterality Date   CARDIOVERSION N/A 11/27/2015   Procedure: CARDIOVERSION;  Surgeon: Wendall Stade, MD;  [facility] ENDOSCOPY;  Service: Cardiovascular;   Laterality: N/A;   KNEE ARTHROSCOPY     PACEMAKER IMPLANT N/A 08/07/2022   Procedure: PACEMAKER IMPLANT;  Surgeon: Regan Lemming, MD;  Location: [facility] INVASIVE CV LAB;  Service: Cardiovascular;  Laterality: N/A;   TIBIA IM NAIL INSERTION Right 04/05/2018   Procedure: INTRAMEDULLARY (IM) NAIL TIBIAL;  Surgeon: Yolonda Kida, MD;  Location[facility];  Service: Orthopedics;  Laterality: Right;     Family History  Problem Relation Age of Onset   Heart attack Mother    Throat cancer Father    Congestive Heart Failure Sister    Congestive Heart Failure Brother      Social History   Socioeconomic History   Marital status: Widowed    Spouse name: Not on file   Number of children: Not on file   Years of education: Not on file   Highest education level: Not on file  Occupational History   Not on file  Tobacco Use   Smoking status: Never    Passive exposure: Yes   Smokeless tobacco: Never  Vaping Use   Vaping Use: Never used  Substance and Sexual Activity   Alcohol use: No    Alcohol/week: 0.0 standard drinks of alcohol   Drug use: No   Sexual activity: Not Currently  Other Topics Concern   Not on file  Social History Narrative   ** Merged History Encounter **       Social Determinants of Health   Financial Resource Strain: Not on file  Food Insecurity: No Food Insecurity (08/05/2022)   Hunger Vital Sign    Worried About Running Out of Food in the Last Year: Never true    Ran Out of Food in the Last Year: Never true  Transportation Needs: No Transportation Needs (08/05/2022)   PRAPARE - Administrator, Civil Service (Medical): No    Lack of Transportation (Non-Medical): No  Physical Activity: Not on file  Stress: Not on file  Social Connections: Not on file  Intimate Partner Violence: Not At Risk (08/05/2022)   Humiliation, Afraid, Rape, and Kick questionnaire    Fear of Current or Ex-Partner: No    Emotionally Abused: No    Physically Abused: No     Sexually Abused: No     BP 108/60   Pulse 88   Ht 5\' 1"  (1.549 m)   Wt 140 lb (63.5 kg)   SpO2 97%   BMI 26.45 kg/m   Physical Exam:  Well appearing NAD HEENT: Unremarkable Neck:  No JVD, no thyromegally Lymphatics:  No adenopathy Back:  No CVA tenderness Lungs:  Clear with no wheezes HEART:  Regular rate rhythm, no murmurs, no rubs, no clicks Abd:  soft, positive bowel sounds, no organomegally, no rebound, no  guarding Ext:  2 plus pulses, no edema, no cyanosis, no clubbing Skin:  No rashes no nodules Neuro:  CN II through XII intact, motor grossly intact  DEVICE  Normal device function.  See PaceArt for details.   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  PAF - she will continue eliquis. No symptoms. 3. PPM - her [employer] DDD PM is working normally. We will recheck in several months.  Sharlot Gowda Annalie Wenner,MD

## 2022-11-24 NOTE — Progress Notes (Signed)
Remote pacemaker transmission.   

## 2022-11-28 DIAGNOSIS — H6123 Impacted cerumen, bilateral: Secondary | ICD-10-CM | POA: Diagnosis not present

## 2022-12-01 ENCOUNTER — Ambulatory Visit: Payer: Medicare HMO | Admitting: Podiatry

## 2022-12-09 DIAGNOSIS — E039 Hypothyroidism, unspecified: Secondary | ICD-10-CM | POA: Diagnosis not present

## 2022-12-09 DIAGNOSIS — I480 Paroxysmal atrial fibrillation: Secondary | ICD-10-CM | POA: Diagnosis not present

## 2022-12-09 DIAGNOSIS — I509 Heart failure, unspecified: Secondary | ICD-10-CM | POA: Diagnosis not present

## 2022-12-09 DIAGNOSIS — E7800 Pure hypercholesterolemia, unspecified: Secondary | ICD-10-CM | POA: Diagnosis not present

## 2022-12-09 DIAGNOSIS — K219 Gastro-esophageal reflux disease without esophagitis: Secondary | ICD-10-CM | POA: Diagnosis not present

## 2022-12-09 DIAGNOSIS — J4530 Mild persistent asthma, uncomplicated: Secondary | ICD-10-CM | POA: Diagnosis not present

## 2022-12-09 DIAGNOSIS — M810 Age-related osteoporosis without current pathological fracture: Secondary | ICD-10-CM | POA: Diagnosis not present

## 2022-12-19 ENCOUNTER — Ambulatory Visit (INDEPENDENT_AMBULATORY_CARE_PROVIDER_SITE_OTHER): Payer: Medicare HMO | Admitting: Emergency Medicine

## 2022-12-19 ENCOUNTER — Encounter: Payer: Self-pay | Admitting: Emergency Medicine

## 2022-12-19 VITALS — BP 120/68 | HR 90 | Temp 98.1°F | Ht 61.0 in | Wt 142.6 lb

## 2022-12-19 DIAGNOSIS — J301 Allergic rhinitis due to pollen: Secondary | ICD-10-CM

## 2022-12-19 DIAGNOSIS — K219 Gastro-esophageal reflux disease without esophagitis: Secondary | ICD-10-CM | POA: Diagnosis not present

## 2022-12-19 DIAGNOSIS — J4520 Mild intermittent asthma, uncomplicated: Secondary | ICD-10-CM | POA: Diagnosis not present

## 2022-12-19 MED ORDER — ALBUTEROL SULFATE HFA 108 (90 BASE) MCG/ACT IN AERS: 1.0000 | INHALATION_SPRAY | Freq: Four times a day (QID) | RESPIRATORY_TRACT | 0 refills | Status: AC | PRN

## 2022-12-19 NOTE — Progress Notes (Signed)
Subjective:    Patient ID: Samantha [name], female    DOB: March 06, 1930, 89 y.o.   MRN: [id number]  HPI  ROV 08/01/20 --follow-up visit for pleasant 89 year old woman with A. fib, allergic rhinitis, GERD and mild intermittent asthma.  She also has allergic rhinitis that is a contributor.  At her last visit we tried coming off of scheduled ICS/LABA see if she would tolerate.  She remained on Protonix twice daily, loratadine, Singulair.  Today she reports that she hasn't missed the [facility]. She has albuterol, but never needs it. She is active, does not have any dyspnea currently. She has more trouble during the Summer months. Cough is controlled. No wheeze.   ROV 08/06/21 --89 year old woman whom I have followed for mild intermittent asthma.  She has a history of allergic rhinitis and GERD which are both contributors to her asthma. PMH: A-fib, hyperlipidemia She reports today that her breathing is stable - she isn't very active, doesn't walk quickly but is able to get around. She is able to shop for her groceries.  Currently managed on loratadine, Singulair, omeprazole.  Has albuterol and uses very rarely. No flares, no abx pred. Minimal cough, no wheeze.   ROV 12/19/22 -- follow up for 89 yo woman, mild intermittent asthma, allergies, GERD, A Fib. 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  Reports today that she is able to be active, but she cannot do all the things she wants to do On loratadine, singulair, omeprazole. She has not needed albuterol in many months. She does get some SOB w walking a distance, better quickly with rest. No cough or wheeze.    Review of Systems  Constitutional:  Negative for fever and unexpected weight change.  HENT:  Negative for congestion, dental problem, ear pain, nosebleeds, postnasal drip, rhinorrhea, sinus pressure, sneezing, sore throat and trouble swallowing.   Eyes:  Negative for redness and itching.  Respiratory:   Positive for shortness of breath. Negative for cough, chest tightness and wheezing.   Cardiovascular:  Negative for palpitations and leg swelling.  Gastrointestinal:  Negative for nausea and vomiting.  Genitourinary:  Negative for dysuria.  Musculoskeletal:  Negative for joint swelling.  Skin:  Negative for rash.  Neurological:  Negative for headaches.  Hematological:  Bruises/bleeds easily.  Psychiatric/Behavioral:  Negative for dysphoric mood. The patient is not nervous/anxious.         Objective:   Physical Exam Vitals:   12/19/22 1446  BP: 120/68  Pulse: 90  Temp: 98.1 F (36.7 C)  TempSrc: Oral  SpO2: 95%  Weight: 142 lb 9.6 oz (64.7 kg)  Height: 5\' 1"  (1.549 m)   Gen: Pleasant, elderly overwt, in no distress,  normal affect  ENT: No lesions,  mouth clear,  oropharynx clear, no postnasal drip  Neck: No JVD, no stridor  Lungs: No use of accessory muscles, clear without rales or rhonchi, no crackles  Cardiovascular: irregular,  no peripheral edema  Musculoskeletal: No deformities, no cyanosis or clubbing  Neuro: alert, non focal  Skin: Warm, no lesions or rashes       Assessment & Plan:  Mild intermittent asthma Doing well. No flares. She does have multifactorial dyspnea but I do not believe we need to change her asthma regimen at this time.   We will refill your albuterol. Keep albuterol available to use 2 puffs up to every 4 hours if needed for shortness of breath, chest tightness, wheezing.  Continue your  Singulair 10mg  each evening Follow with Dr. Delton Coombes in 12 months or sooner if you have any problems.   Allergic rhinitis Continue loratadine 10mg  daily  G E R D Continue omeprazole as you have been taking it   Levy Pupa, MD, PhD 12/19/2022, 3:08 PM [facility] [phone] or if no answer [phone]

## 2022-12-19 NOTE — Assessment & Plan Note (Signed)
Doing well. No flares. She does have multifactorial dyspnea but I do not believe we need to change her asthma regimen at this time.   We will refill your albuterol. Keep albuterol available to use 2 puffs up to every 4 hours if needed for shortness of breath, chest tightness, wheezing.  Continue your Singulair 10mg  each evening Follow with Dr. Delton Coombes in 12 months or sooner if you have any problems.

## 2022-12-19 NOTE — Patient Instructions (Addendum)
We will refill your albuterol. Keep albuterol available to use 2 puffs up to every 4 hours if needed for shortness of breath, chest tightness, wheezing.  Continue your Singulair 10mg  each evening Continue loratadine 10mg  daily Continue omeprazole as you have been taking it Follow with Dr. Delton Coombes in 12 months or sooner if you have any problems.

## 2022-12-19 NOTE — Assessment & Plan Note (Signed)
Continue loratadine 10 mg daily

## 2022-12-19 NOTE — Addendum Note (Signed)
Addended by: Dorisann Frames R on: 12/19/2022 03:13 PM   Modules accepted: Orders

## 2022-12-19 NOTE — Assessment & Plan Note (Signed)
Continue omeprazole as you have been taking it 

## 2023-01-12 ENCOUNTER — Ambulatory Visit: Payer: Medicare HMO | Admitting: Podiatry

## 2023-01-12 ENCOUNTER — Encounter: Payer: Self-pay | Admitting: Podiatry

## 2023-01-12 DIAGNOSIS — D689 Coagulation defect, unspecified: Secondary | ICD-10-CM | POA: Diagnosis not present

## 2023-01-12 DIAGNOSIS — M79676 Pain in unspecified toe(s): Secondary | ICD-10-CM

## 2023-01-12 DIAGNOSIS — I739 Peripheral vascular disease, unspecified: Secondary | ICD-10-CM

## 2023-01-12 DIAGNOSIS — B351 Tinea unguium: Secondary | ICD-10-CM | POA: Diagnosis not present

## 2023-01-12 NOTE — Progress Notes (Signed)
  Subjective:  Patient ID: Samantha [name], female    DOB: 09/16/1929,  MRN: [id number]  BEVIE MORTENSON presents to clinic today for at risk foot care. Patient has h/o PAD and painful thick toenails that are difficult to trim. Pain interferes with ambulation. Aggravating factors include wearing enclosed shoe gear. Pain is relieved with periodic professional debridement.  Chief Complaint  Patient presents with   RFC    RFC   New problem(s): None.   PCP is Thana Ates, MD.  Allergies  Allergen Reactions   Erythromycin Diarrhea and Rash   Vibra-Tab [Doxycycline] Hives   Benzalkonium Chloride Rash   Erythromycin Rash   Fosamax [Alendronate] Nausea Only and Other (See Comments)    Severe stomach cramps   Neo-Bacit-Poly-Lidocaine Rash   Neomy-Bacit-Polymyx-Pramoxine Rash   Neosporin [Neomycin-Bacitracin Zn-Polymyx] Rash   Penicillins Rash    DID THE REACTION INVOLVE: Swelling of the face/tongue/throat, SOB, or low BP? N Sudden or severe rash/hives, skin peeling, or the inside of the mouth or nose? Y Did it require medical treatment? N When did it last happen?      Childhood If all above answers are "NO", may proceed with cephalosporin use.   Zithromax [Azithromycin] Diarrhea    Review of Systems: Negative except as noted in the HPI.  Objective:   There were no vitals filed for this visit.  Samantha [name] is a pleasant 89 y.o. female WD, WN in NAD. AAO x 3. Vascular Capillary refill time to digits immediate b/l. Palpable DP pulse(s) b/l LE. Diminished PT pulse(s) b/l LE. Pedal hair sparse. No pain with calf compression b/l. Varicosities present b/l. No cyanosis or clubbing noted b/l LE.  Neurologic Normal speech. Oriented to person, place, and time. Protective sensation intact 5/5 intact bilaterally with 10g monofilament b/l. Vibratory sensation intact b/l.  Dermatologic Pedal integument with normal turgor, texture and tone BLE. No open wounds b/l LE. No interdigital macerations  noted b/l LE. Toenails 1-5 b/l elongated, discolored, dystrophic, thickened, crumbly with subungual debris and tenderness to dorsal palpation.   No hyperkeratotic nor porokeratotic lesions present on today's visit.  Orthopedic: Muscle strength 5/5 to all lower extremity muscle groups bilaterally. Tailor's bunion deformity noted b/l LE. Hammertoe deformity noted 2-5 b/l. Utilizes cane for ambulation assistance.   Radiographs: None  Assessment/Plan: No diagnosis found.    No orders of the defined types were placed in this encounter.   -Consent given for treatment as described below: -Continue supportive shoe gear daily. -Toenails 1-5 b/l were debrided in length and girth with sterile nail nippers and dremel without iatrogenic bleeding.  -Patient/POA to call should there be question/concern in the interim.   No follow-ups on file.  Louann Sjogren, DPM

## 2023-01-20 ENCOUNTER — Ambulatory Visit (HOSPITAL_COMMUNITY)
Admission: EM | Admit: 2023-01-20 | Discharge: 2023-01-20 | Disposition: A | Discharge status: Home / Self Care | Payer: Medicare HMO | Attending: Family Medicine | Admitting: Family Medicine

## 2023-01-20 ENCOUNTER — Encounter (HOSPITAL_COMMUNITY): Payer: Self-pay

## 2023-01-20 DIAGNOSIS — S80861A Insect bite (nonvenomous), right lower leg, initial encounter: Secondary | ICD-10-CM | POA: Diagnosis not present

## 2023-01-20 DIAGNOSIS — R21 Rash and other nonspecific skin eruption: Secondary | ICD-10-CM

## 2023-01-20 DIAGNOSIS — W57XXXA Bitten or stung by nonvenomous insect and other nonvenomous arthropods, initial encounter: Secondary | ICD-10-CM | POA: Diagnosis not present

## 2023-01-20 NOTE — ED Triage Notes (Addendum)
Pt c/o ?bug bite to rt ankle on Friday and now having increase redness and itching. Hx of cellulitis x3 to same area.

## 2023-01-20 NOTE — ED Provider Notes (Signed)
[facility]   [id number] 01/20/23 Arrival Time: 1409  ASSESSMENT & PLAN:  1. Rash and nonspecific skin eruption   2. Insect bite of right lower leg, initial encounter     No signs of infection. Continue Mupirocin and betamethasone ointments.  Area of redness outlined with skin pen.    Discharge Instructions      Continue using your antibiotic and steroid ointments. We have outlined the area of redness. If the redness creeps beyond this line, return here. If not allergic, you may use over the counter ibuprofen or acetaminophen as needed.      Will follow up with PCP or here if worsening or failing to improve as anticipated. Reviewed expectations re: course of current medical issues. Questions answered. Outlined signs and symptoms indicating need for more acute intervention. Patient verbalized understanding. After Visit Summary given.   SUBJECTIVE:  Samantha [name] is a 89 y.o. female who presents with a skin complaint. Pt c/o ?bug bite to rt ankle on Friday and now having increase redness and itching. Hx of cellulitis x3 to same area.  Has been putting betamethasone oint on induration; helps with itching. Denies fever/chills.  OBJECTIVE: Vitals:   01/20/23 1436  BP: 106/61  Pulse: 71  Resp: 18  Temp: 98.1 F (36.7 C)  TempSrc: Oral  SpO2: 93%    General appearance: alert; no distress HEENT: [place]; AT Neck: supple with FROM Extremities: no edema; moves all extremities normally RLE: warm and dry; isolated area of erythema over Superior lateral R ankle measuring approx 1x1.5 cm; cool to touch; without appreciable FB; without fluctuance; ankle with FROM Psychological: alert and cooperative; normal mood and affect  Allergies  Allergen Reactions   Erythromycin Diarrhea and Rash   Vibra-Tab [Doxycycline] Hives   Benzalkonium Chloride Rash   Erythromycin Rash   Fosamax [Alendronate] Nausea Only and Other (See Comments)    Severe stomach cramps    Neo-Bacit-Poly-Lidocaine Rash   Neomy-Bacit-Polymyx-Pramoxine Rash   Neosporin [Neomycin-Bacitracin Zn-Polymyx] Rash   Penicillins Rash    DID THE REACTION INVOLVE: Swelling of the face/tongue/throat, SOB, or low BP? N Sudden or severe rash/hives, skin peeling, or the inside of the mouth or nose? Y Did it require medical treatment? N When did it last happen?      Childhood If all above answers are "NO", may proceed with cephalosporin use.   Zithromax [Azithromycin] Diarrhea    Past Medical History:  Diagnosis Date   Arthritis    Asthma    GERD (gastroesophageal reflux disease)    High cholesterol    Hypothyroidism    LBBB (left bundle branch block)    Ventricular ectopy    Social History   Socioeconomic History   Marital status: Widowed    Spouse name: Not on file   Number of children: Not on file   Years of education: Not on file   Highest education level: Not on file  Occupational History   Not on file  Tobacco Use   Smoking status: Never    Passive exposure: Yes   Smokeless tobacco: Never  Vaping Use   Vaping status: Never Used  Substance and Sexual Activity   Alcohol use: No    Alcohol/week: 0.0 standard drinks of alcohol   Drug use: No   Sexual activity: Not Currently  Other Topics Concern   Not on file  Social History Narrative   ** Merged History Encounter **       Social Determinants of Health  Financial Resource Strain: Not on file  Food Insecurity: No Food Insecurity (08/05/2022)   Hunger Vital Sign    Worried About Running Out of Food in the Last Year: Never true    Ran Out of Food in the Last Year: Never true  Transportation Needs: No Transportation Needs (08/05/2022)   PRAPARE - Administrator, Civil Service (Medical): No    Lack of Transportation (Non-Medical): No  Physical Activity: Not on file  Stress: Not on file  Social Connections: Not on file  Intimate Partner Violence: Not At Risk (08/05/2022)   Humiliation, Afraid, Rape, and  Kick questionnaire    Fear of Current or Ex-Partner: No    Emotionally Abused: No    Physically Abused: No    Sexually Abused: No   Family History  Problem Relation Age of Onset   Heart attack Mother    Throat cancer Father    Congestive Heart Failure Sister    Congestive Heart Failure Brother    Past Surgical History:  Procedure Laterality Date   CARDIOVERSION N/A 11/27/2015   Procedure: CARDIOVERSION;  Surgeon: Wendall Stade, MD;  Location: [facility] ENDOSCOPY;  Service: Cardiovascular;  Laterality: N/A;   KNEE ARTHROSCOPY     PACEMAKER IMPLANT N/A 08/07/2022   Procedure: PACEMAKER IMPLANT;  Surgeon: Regan Lemming, MD;  Location: [facility] INVASIVE CV LAB;  Service: Cardiovascular;  Laterality: N/A;   TIBIA IM NAIL INSERTION Right 04/05/2018   Procedure: INTRAMEDULLARY (IM) NAIL TIBIAL;  Surgeon: Yolonda Kida, MD;  Location: [facility] OR;  Service: Orthopedics;  Laterality: Right;      Mardella Layman, MD 01/20/23 1530

## 2023-01-20 NOTE — Discharge Instructions (Signed)
Continue using your antibiotic and steroid ointments. We have outlined the area of redness. If the redness creeps beyond this line, return here. If not allergic, you may use over the counter ibuprofen or acetaminophen as needed.

## 2023-01-25 ENCOUNTER — Encounter (HOSPITAL_COMMUNITY): Payer: Self-pay | Admitting: *Deleted

## 2023-01-25 ENCOUNTER — Ambulatory Visit (HOSPITAL_COMMUNITY)
Admission: EM | Admit: 2023-01-25 | Discharge: 2023-01-25 | Disposition: A | Discharge status: Home / Self Care | Payer: Medicare HMO | Attending: Family Medicine | Admitting: Family Medicine

## 2023-01-25 DIAGNOSIS — L723 Sebaceous cyst: Secondary | ICD-10-CM

## 2023-01-25 NOTE — ED Triage Notes (Signed)
Pt states she noticed yesterday under her left breast she noticed a spot. She states she felt it but can't see the spot. There is a black clogged pore in the area.

## 2023-01-25 NOTE — ED Provider Notes (Signed)
[facility]    CSN: [id number] Arrival date & time: 01/25/23  1255      History   Chief Complaint Chief Complaint  Patient presents with   Breast Problem    HPI Samantha [name] is a 89 y.o. female.   HPI Here for a spot for a bump that she first noticed under her left breast yesterday.  She cannot see it well and was concerned that it might be a tick as she has had to take previously.  She is also had some cellulitis trouble in the past and wanted to make sure she did not need any specific treatment   Past Medical History:  Diagnosis Date   Arthritis    Asthma    GERD (gastroesophageal reflux disease)    High cholesterol    Hypothyroidism    LBBB (left bundle branch block)    Ventricular ectopy     Patient Active Problem List   Diagnosis Date Noted   Complete heart block (HCC) 08/05/2022   Anxiety 01/15/2022   Tick bite 10/10/2021   Cellulitis 10/09/2021   Blood clotting disorder (HCC) 11/20/2020   Age-related osteoporosis without current pathological fracture 07/30/2020   Hardening of the aorta (main artery of the heart) (HCC) 07/30/2020   Heart failure (HCC) 07/30/2020   Intestinal malabsorption 07/30/2020   Mild intermittent asthma 07/30/2020   Pure hypercholesterolemia 07/30/2020   Thrombophilia (HCC) 07/30/2020   Neck pain 04/03/2020   Long term (current) use of anticoagulants 03/05/2020   Unilateral primary osteoarthritis, right knee 01/26/2020   BPPV (benign paroxysmal positional vertigo), right 05/13/2019   Anticoagulated 05/13/2019   Epistaxis 05/13/2019   Carpal tunnel syndrome, right upper limb 04/12/2019   Trigger thumb, left thumb 02/10/2019   Numbness and tingling in right hand 02/10/2019   Pain in left foot 02/10/2019   Closed fracture of right proximal tibia 06/22/2018   Paroxysmal atrial fibrillation (HCC) 05/29/2018   Hypokalemia 05/29/2018   Pain and swelling of right lower extremity 05/28/2018   Closed fracture of shaft of  tibia 04/29/2018   Aftercare 04/23/2018   SAH (subarachnoid hemorrhage) (HCC) 04/02/2018   Chronic bilateral low back pain with bilateral sciatica 03/08/2018   Abnormal CT scan, chest 08/08/2016   Hearing loss 09/10/2015   Impacted cerumen of right ear 09/10/2015   Otorrhea of left ear 09/10/2015   Tympanic membrane perforation, left 09/10/2015   Near syncope 01/06/2015   Left leg weakness 01/06/2015   Hyperlipidemia    Hypothyroidism    Ventricular ectopy    Chest pain 05/22/2014   Left bundle branch block 05/22/2014   PVC's (premature ventricular contractions) 05/22/2014   HOARSENESS 02/14/2010   CHEST PAIN, ATYPICAL 02/14/2010   Allergic rhinitis 02/13/2010   G E R D 02/13/2010    Past Surgical History:  Procedure Laterality Date   CARDIOVERSION N/A 11/27/2015   Procedure: CARDIOVERSION;  Surgeon: Wendall Stade, MD;  Location: [facility] ENDOSCOPY;  Service: Cardiovascular;  Laterality: N/A;   KNEE ARTHROSCOPY     PACEMAKER IMPLANT N/A 08/07/2022   Procedure: PACEMAKER IMPLANT;  Surgeon: Regan Lemming, MD;  Location: [facility]AS[facility] LAB;  Service: Cardiovascular;  Laterality: N/A;   TIBIA IM NAIL INSERTION Right 04/05/2018   Procedure: INTRAMEDULLARY (IM) NAIL TIBIAL;  Surgeon: Yolonda Kida, MD;  Location: [facility] OR;  Service: Orthopedics;  Laterality: Right;    OB History   No obstetric history on file.      Home Medications    Prior  to Admission medications   Medication Sig Start Date End Date Taking? Authorizing Provider  albuterol (VENTOLIN HFA) 108 (90 Base) MCG/ACT inhaler Inhale 1-2 puffs into the lungs every 6 (six) hours as needed for wheezing or shortness of breath. 12/19/22  Yes Leslye Peer, MD  apixaban (ELIQUIS) 5 MG TABS tablet Take 1 tablet (5 mg total) by mouth in the morning and at bedtime. 08/10/22  Yes Sherie Don, NP  Calcium-Magnesium-Zinc (CALCIUM-MAGNESUIUM-ZINC PO) Take 1 tablet by mouth every Monday, Wednesday, and Friday.   Yes [provider]  Cholecalciferol (VITAMIN D3) 50 MCG (2000 UT) CAPS Take 2,000 Units by mouth daily.   Yes [provider]  cyanocobalamin (VITAMIN B12) 1000 MCG tablet Take 1,000 mcg by mouth every Monday, Wednesday, and Friday.   Yes [provider]  furosemide (LASIX) 20 MG tablet Take 20 mg by mouth See admin instructions. 20 mg once daily between 1600-1800.   Yes [provider]  levothyroxine (SYNTHROID) 125 MCG tablet Take 125 mcg by mouth daily before breakfast. 08/17/20  Yes [provider]  loratadine (CLARITIN) 10 MG tablet Take 10 mg by mouth daily.   Yes [provider]  lovastatin (MEVACOR) 20 MG tablet Take 20 mg by mouth at bedtime.   Yes [provider]  Lubricants (LUBRICATING JELLY) GEL Place 1 application  vaginally daily.   Yes [provider]  metoprolol succinate (TOPROL-XL) 50 MG 24 hr tablet Take 50 mg by mouth at bedtime.   Yes [provider]  montelukast (SINGULAIR) 10 MG tablet Take 10 mg by mouth at bedtime.   Yes [provider]  Multiple Vitamins-Minerals ([employer] ADULT 50+ EYE HEALTH) CAPS Take 1 capsule by mouth daily.   Yes [provider]  mupirocin ointment (BACTROBAN) 2 % Apply 1 application. topically 2 (two) times daily. Apply to area on foot. Patient taking differently: Apply 1 application  topically 2 (two) times daily. To each nostril 11/05/21  Yes Ellsworth Lennox, PA-C  Omega-3 Fatty Acids (FISH OIL) 1000 MG CAPS Take 1,000 mg by mouth daily after breakfast.   Yes [provider]  omeprazole (PRILOSEC) 40 MG capsule Take 40 mg by mouth at bedtime.   Yes [provider]  polyethylene glycol (MIRALAX / GLYCOLAX) packet Take 8.5 g by mouth every other day.   Yes [provider]  potassium chloride (K-DUR) 10 MEQ tablet Take 1 tablet (10 mEq total) by mouth daily. Patient taking differently: Take 10 mEq by mouth See admin instructions. 10 mEq once daily  between 1600-1800. 10/24/15  Yes Wendall Stade, MD  sodium chloride (OCEAN) 0.65 % SOLN nasal spray Place 1 spray into both nostrils 3 (three) times daily as needed (nasal dryness).   Yes [provider]  denosumab (PROLIA) 60 MG/ML SOSY injection Inject 60 mg into the skin every 6 (six) months.    [provider]    Family History Family History  Problem Relation Age of Onset   Heart attack Mother    Throat cancer Father    Congestive Heart Failure Sister    Congestive Heart Failure Brother     Social History Social History   Tobacco Use   Smoking status: Never    Passive exposure: Yes   Smokeless tobacco: Never  Vaping Use   Vaping status: Never Used  Substance Use Topics   Alcohol use: No    Alcohol/week: 0.0 standard drinks of alcohol   Drug use: No  Allergies   Erythromycin, Vibra-tab [doxycycline], Benzalkonium chloride, Erythromycin, Fosamax [alendronate], Neo-bacit-poly-lidocaine, Neomy-bacit-polymyx-pramoxine, Neosporin [neomycin-bacitracin zn-polymyx], Penicillins, and Zithromax [azithromycin]   Review of Systems Review of Systems   Physical Exam Triage Vital Signs ED Triage Vitals  Encounter Vitals Group     BP 01/25/23 1434 137/76     Systolic BP Percentile --      Diastolic BP Percentile --      Pulse Rate 01/25/23 1434 84     Resp 01/25/23 1434 18     Temp 01/25/23 1434 (!) 97.2 F (36.2 C)     Temp Source 01/25/23 1434 Oral     SpO2 01/25/23 1434 97 %     Weight --      Height --      Head Circumference --      Peak Flow --      Pain Score 01/25/23 1433 0     Pain Loc --      Pain Education --      Exclude from Growth Chart --    No data found.  Updated Vital Signs BP 137/76 (BP Location: Left Arm)   Pulse 84   Temp (!) 97.2 F (36.2 C) (Oral)   Resp 18   SpO2 97%   Visual Acuity Right Eye Distance:   Left Eye Distance:   Bilateral Distance:    Right Eye Near:   Left Eye Near:    Bilateral Near:      Physical Exam Vitals reviewed.  Skin:    Coloration: Skin is not pale.     Comments: Under her left medial breast there is a slightly raised bump about half a centimeter in diameter.  There is no erythema.  There is little black discoloration in the center.  There is no drainage.   Neurological:     Mental Status: She is alert and oriented to person, place, and time.  Psychiatric:        Behavior: Behavior normal.      UC Treatments / Results  Labs (all labs ordered are listed, but only abnormal results are displayed) Labs Reviewed - No data to display  EKG   Radiology No results found.  Procedures Procedures (including critical care time)  Medications Ordered in UC Medications - No data to display  Initial Impression / Assessment and Plan / UC Course  I have reviewed the triage vital signs and the nursing notes.  Pertinent labs & imaging results that were available during my care of the patient were reviewed by me and considered in my medical decision making (see chart for details).        Discussed with her that this seems to be a sebaceous cyst and it does not seem infected at this time.  She is very concerned about the possibility of cellulitis, so she is going to go ahead and apply some mupirocin that she has at home twice daily to the area.  If it becomes more swollen or painful and erythematous, she will return to be seen.  She will follow-up with her primary care about this issue Final Clinical Impressions(s) / UC Diagnoses   Final diagnoses:  Sebaceous cyst     Discharge Instructions      You can apply mupirocin on the cyst 2 times daily till it feels better.    ED Prescriptions   None    PDMP not reviewed this encounter.   Zenia Resides, MD 01/25/23 [phone]

## 2023-01-25 NOTE — Discharge Instructions (Signed)
You can apply mupirocin on the cyst 2 times daily till it feels better.

## 2023-01-28 DIAGNOSIS — H6122 Impacted cerumen, left ear: Secondary | ICD-10-CM | POA: Diagnosis not present

## 2023-02-05 LAB — CUP PACEART REMOTE DEVICE CHECK
Battery Remaining Longevity: 107 mo
Battery Remaining Percentage: 95.5 %
Battery Voltage: 2.99 V
Brady Statistic AP VP Percent: 0 %
Brady Statistic AP VS Percent: 0 %
Brady Statistic AS VP Percent: 0 %
Brady Statistic AS VS Percent: 0 %
Brady Statistic RA Percent Paced: 1 %
Brady Statistic RV Percent Paced: 45 %
Date Time Interrogation Session: 20240905020013
Implantable Lead Connection Status: 753985
Implantable Lead Connection Status: 753985
Implantable Lead Implant Date: 20240307
Implantable Lead Implant Date: 20240307
Implantable Lead Location: 753859
Implantable Lead Location: 753860
Implantable Pulse Generator Implant Date: 20240307
Lead Channel Impedance Value: 360 Ohm
Lead Channel Impedance Value: 560 Ohm
Lead Channel Pacing Threshold Amplitude: 0.5 V
Lead Channel Pacing Threshold Amplitude: 0.75 V
Lead Channel Pacing Threshold Pulse Width: 0.5 ms
Lead Channel Pacing Threshold Pulse Width: 0.5 ms
Lead Channel Sensing Intrinsic Amplitude: 1.8 mV
Lead Channel Sensing Intrinsic Amplitude: 5.8 mV
Lead Channel Setting Pacing Amplitude: 2.5 V
Lead Channel Setting Pacing Amplitude: 2.5 V
Lead Channel Setting Pacing Pulse Width: 0.5 ms
Lead Channel Setting Sensing Sensitivity: 2 mV
Pulse Gen Model: 2272
Pulse Gen Serial Number: 8163768

## 2023-02-06 ENCOUNTER — Ambulatory Visit (INDEPENDENT_AMBULATORY_CARE_PROVIDER_SITE_OTHER): Payer: Medicare HMO

## 2023-02-06 DIAGNOSIS — I442 Atrioventricular block, complete: Secondary | ICD-10-CM | POA: Diagnosis not present

## 2023-02-10 NOTE — Progress Notes (Signed)
Remote pacemaker transmission.   

## 2023-02-26 ENCOUNTER — Other Ambulatory Visit: Payer: Self-pay | Admitting: Cardiovascular Disease

## 2023-03-04 DIAGNOSIS — E7800 Pure hypercholesterolemia, unspecified: Secondary | ICD-10-CM | POA: Diagnosis not present

## 2023-03-04 DIAGNOSIS — Z0000 Encounter for general adult medical examination without abnormal findings: Secondary | ICD-10-CM | POA: Diagnosis not present

## 2023-03-04 DIAGNOSIS — Z79899 Other long term (current) drug therapy: Secondary | ICD-10-CM | POA: Diagnosis not present

## 2023-03-04 DIAGNOSIS — Z23 Encounter for immunization: Secondary | ICD-10-CM | POA: Diagnosis not present

## 2023-03-04 DIAGNOSIS — Z1331 Encounter for screening for depression: Secondary | ICD-10-CM | POA: Diagnosis not present

## 2023-03-04 DIAGNOSIS — M810 Age-related osteoporosis without current pathological fracture: Secondary | ICD-10-CM | POA: Diagnosis not present

## 2023-03-16 ENCOUNTER — Ambulatory Visit: Payer: Medicare HMO | Admitting: Podiatry

## 2023-03-16 ENCOUNTER — Encounter: Payer: Self-pay | Admitting: Podiatry

## 2023-03-16 DIAGNOSIS — I739 Peripheral vascular disease, unspecified: Secondary | ICD-10-CM

## 2023-03-16 DIAGNOSIS — B351 Tinea unguium: Secondary | ICD-10-CM | POA: Diagnosis not present

## 2023-03-16 DIAGNOSIS — M79676 Pain in unspecified toe(s): Secondary | ICD-10-CM | POA: Diagnosis not present

## 2023-03-16 NOTE — Progress Notes (Signed)
  Subjective:  Patient ID: Samantha [name], female    DOB: Aug 28, 1929,  MRN: [id number]  Samantha [name] presents to clinic today for at risk foot care. Patient has h/o PAD and painful thick toenails that are difficult to trim. Pain interferes with ambulation. Aggravating factors include wearing enclosed shoe gear. Pain is relieved with periodic professional debridement.  Chief Complaint  Patient presents with   Nail Problem    RFC.   New problem(s): None.   PCP is Thana Ates, MD.  Allergies  Allergen Reactions   Erythromycin Diarrhea and Rash   Vibra-Tab [Doxycycline] Hives   Benzalkonium Chloride Rash   Erythromycin Rash   Fosamax [Alendronate] Nausea Only and Other (See Comments)    Severe stomach cramps   Neo-Bacit-Poly-Lidocaine Rash   Neomy-Bacit-Polymyx-Pramoxine Rash   Neosporin [Neomycin-Bacitracin Zn-Polymyx] Rash   Penicillins Rash    DID THE REACTION INVOLVE: Swelling of the face/tongue/throat, SOB, or low BP? N Sudden or severe rash/hives, skin peeling, or the inside of the mouth or nose? Y Did it require medical treatment? N When did it last happen?      Childhood If all above answers are "NO", may proceed with cephalosporin use.   Zithromax [Azithromycin] Diarrhea    Review of Systems: Negative except as noted in the HPI.  Objective:   There were no vitals filed for this visit.  Samantha [name] is a pleasant 89 y.o. female WD, WN in NAD. AAO x 3. Vascular Capillary refill time to digits immediate b/l. Palpable DP pulse(s) b/l LE. Diminished PT pulse(s) b/l LE. Pedal hair sparse. No pain with calf compression b/l. Varicosities present b/l. No cyanosis or clubbing noted b/l LE.  Neurologic Normal speech. Oriented to person, place, and time. Protective sensation intact 5/5 intact bilaterally with 10g monofilament b/l. Vibratory sensation intact b/l.  Dermatologic Pedal integument with normal turgor, texture and tone BLE. No open wounds b/l LE. No interdigital  macerations noted b/l LE. Toenails 1-5 b/l elongated, discolored, dystrophic, thickened, crumbly with subungual debris and tenderness to dorsal palpation.   No hyperkeratotic nor porokeratotic lesions present on today's visit.  Orthopedic: Muscle strength 5/5 to all lower extremity muscle groups bilaterally. Tailor's bunion deformity noted b/l LE. Hammertoe deformity noted 2-5 b/l. Utilizes cane for ambulation assistance.   Radiographs: None  Assessment/Plan: 1. Pain due to onychomycosis of toenail   2. PAD (peripheral artery disease) (HCC)       No orders of the defined types were placed in this encounter.   -Consent given for treatment as described below: -Continue supportive shoe gear daily. -Toenails 1-5 b/l were debrided in length and girth with sterile nail nippers and dremel without iatrogenic bleeding.  -Patient/POA to call should there be question/concern in the interim.   No follow-ups on file.  Louann Sjogren, DPM

## 2023-03-25 DIAGNOSIS — M810 Age-related osteoporosis without current pathological fracture: Secondary | ICD-10-CM | POA: Diagnosis not present

## 2023-03-30 DIAGNOSIS — H6122 Impacted cerumen, left ear: Secondary | ICD-10-CM | POA: Diagnosis not present

## 2023-05-08 ENCOUNTER — Ambulatory Visit (INDEPENDENT_AMBULATORY_CARE_PROVIDER_SITE_OTHER): Payer: Medicare HMO

## 2023-05-08 DIAGNOSIS — I442 Atrioventricular block, complete: Secondary | ICD-10-CM | POA: Diagnosis not present

## 2023-05-08 LAB — CUP PACEART REMOTE DEVICE CHECK
Battery Remaining Longevity: 104 mo
Battery Remaining Percentage: 95 %
Battery Voltage: 2.99 V
Brady Statistic AP VP Percent: 0 %
Brady Statistic AP VS Percent: 0 %
Brady Statistic AS VP Percent: 0 %
Brady Statistic AS VS Percent: 0 %
Brady Statistic RA Percent Paced: 1 %
Brady Statistic RV Percent Paced: 45 %
Date Time Interrogation Session: 20241206020014
Implantable Lead Connection Status: 753985
Implantable Lead Connection Status: 753985
Implantable Lead Implant Date: 20240307
Implantable Lead Implant Date: 20240307
Implantable Lead Location: 753859
Implantable Lead Location: 753860
Implantable Pulse Generator Implant Date: 20240307
Lead Channel Impedance Value: 380 Ohm
Lead Channel Impedance Value: 530 Ohm
Lead Channel Pacing Threshold Amplitude: 0.5 V
Lead Channel Pacing Threshold Amplitude: 0.75 V
Lead Channel Pacing Threshold Pulse Width: 0.5 ms
Lead Channel Pacing Threshold Pulse Width: 0.5 ms
Lead Channel Sensing Intrinsic Amplitude: 1.8 mV
Lead Channel Sensing Intrinsic Amplitude: 5 mV
Lead Channel Setting Pacing Amplitude: 2.5 V
Lead Channel Setting Pacing Amplitude: 2.5 V
Lead Channel Setting Pacing Pulse Width: 0.5 ms
Lead Channel Setting Sensing Sensitivity: 2 mV
Pulse Gen Model: 2272
Pulse Gen Serial Number: 8163768

## 2023-06-01 DIAGNOSIS — J3489 Other specified disorders of nose and nasal sinuses: Secondary | ICD-10-CM | POA: Diagnosis not present

## 2023-06-01 DIAGNOSIS — H7292 Unspecified perforation of tympanic membrane, left ear: Secondary | ICD-10-CM | POA: Diagnosis not present

## 2023-06-01 DIAGNOSIS — H6123 Impacted cerumen, bilateral: Secondary | ICD-10-CM | POA: Insufficient documentation

## 2023-06-01 DIAGNOSIS — H938X3 Other specified disorders of ear, bilateral: Secondary | ICD-10-CM | POA: Diagnosis not present

## 2023-06-29 DIAGNOSIS — H938X3 Other specified disorders of ear, bilateral: Secondary | ICD-10-CM | POA: Diagnosis not present

## 2023-06-29 DIAGNOSIS — H7292 Unspecified perforation of tympanic membrane, left ear: Secondary | ICD-10-CM | POA: Diagnosis not present

## 2023-06-30 ENCOUNTER — Ambulatory Visit: Payer: Medicare HMO | Admitting: Podiatry

## 2023-06-30 ENCOUNTER — Encounter: Payer: Self-pay | Admitting: Podiatry

## 2023-06-30 VITALS — Ht 61.0 in | Wt 142.0 lb

## 2023-06-30 DIAGNOSIS — B351 Tinea unguium: Secondary | ICD-10-CM

## 2023-06-30 DIAGNOSIS — M79676 Pain in unspecified toe(s): Secondary | ICD-10-CM | POA: Diagnosis not present

## 2023-06-30 DIAGNOSIS — I739 Peripheral vascular disease, unspecified: Secondary | ICD-10-CM | POA: Diagnosis not present
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
## 2023-07-03 ENCOUNTER — Encounter: Payer: Self-pay | Admitting: Podiatry

## 2023-07-03 DIAGNOSIS — Z961 Presence of intraocular lens: Secondary | ICD-10-CM | POA: Diagnosis not present

## 2023-07-03 DIAGNOSIS — H353131 Nonexudative age-related macular degeneration, bilateral, early dry stage: Secondary | ICD-10-CM | POA: Diagnosis not present

## 2023-07-05 ENCOUNTER — Emergency Department (HOSPITAL_COMMUNITY): Payer: Medicare HMO

## 2023-07-05 ENCOUNTER — Other Ambulatory Visit: Payer: Self-pay

## 2023-07-05 ENCOUNTER — Emergency Department (HOSPITAL_COMMUNITY)
Admission: EM | Admit: 2023-07-05 | Discharge: 2023-07-05 | Disposition: A | Discharge status: Home / Self Care | Payer: Medicare HMO

## 2023-07-05 ENCOUNTER — Encounter (HOSPITAL_COMMUNITY): Payer: Self-pay

## 2023-07-05 DIAGNOSIS — E039 Hypothyroidism, unspecified: Secondary | ICD-10-CM | POA: Diagnosis not present

## 2023-07-05 DIAGNOSIS — W07XXXA Fall from chair, initial encounter: Secondary | ICD-10-CM | POA: Diagnosis not present

## 2023-07-05 DIAGNOSIS — M47812 Spondylosis without myelopathy or radiculopathy, cervical region: Secondary | ICD-10-CM | POA: Diagnosis not present

## 2023-07-05 DIAGNOSIS — S0003XA Contusion of scalp, initial encounter: Secondary | ICD-10-CM | POA: Insufficient documentation

## 2023-07-05 DIAGNOSIS — M545 Low back pain, unspecified: Secondary | ICD-10-CM | POA: Insufficient documentation

## 2023-07-05 DIAGNOSIS — I10 Essential (primary) hypertension: Secondary | ICD-10-CM | POA: Diagnosis not present

## 2023-07-05 DIAGNOSIS — Z7901 Long term (current) use of anticoagulants: Secondary | ICD-10-CM | POA: Insufficient documentation

## 2023-07-05 DIAGNOSIS — Z79899 Other long term (current) drug therapy: Secondary | ICD-10-CM | POA: Diagnosis not present

## 2023-07-05 DIAGNOSIS — Z043 Encounter for examination and observation following other accident: Secondary | ICD-10-CM | POA: Diagnosis not present

## 2023-07-05 DIAGNOSIS — I517 Cardiomegaly: Secondary | ICD-10-CM | POA: Diagnosis not present

## 2023-07-05 DIAGNOSIS — J45909 Unspecified asthma, uncomplicated: Secondary | ICD-10-CM | POA: Diagnosis not present

## 2023-07-05 DIAGNOSIS — M160 Bilateral primary osteoarthritis of hip: Secondary | ICD-10-CM | POA: Diagnosis not present

## 2023-07-05 DIAGNOSIS — S300XXA Contusion of lower back and pelvis, initial encounter: Secondary | ICD-10-CM | POA: Diagnosis not present

## 2023-07-05 DIAGNOSIS — M51369 Other intervertebral disc degeneration, lumbar region without mention of lumbar back pain or lower extremity pain: Secondary | ICD-10-CM | POA: Diagnosis not present

## 2023-07-05 DIAGNOSIS — S199XXA Unspecified injury of neck, initial encounter: Secondary | ICD-10-CM | POA: Diagnosis not present

## 2023-07-05 DIAGNOSIS — S0990XA Unspecified injury of head, initial encounter: Secondary | ICD-10-CM | POA: Diagnosis not present

## 2023-07-05 DIAGNOSIS — W19XXXA Unspecified fall, initial encounter: Secondary | ICD-10-CM | POA: Diagnosis not present

## 2023-07-05 DIAGNOSIS — M549 Dorsalgia, unspecified: Secondary | ICD-10-CM | POA: Diagnosis not present

## 2023-07-05 DIAGNOSIS — Z743 Need for continuous supervision: Secondary | ICD-10-CM | POA: Diagnosis not present

## 2023-07-05 DIAGNOSIS — I700 Atherosclerosis of aorta: Secondary | ICD-10-CM | POA: Diagnosis not present

## 2023-07-05 DIAGNOSIS — M25512 Pain in left shoulder: Secondary | ICD-10-CM | POA: Diagnosis not present

## 2023-07-05 DIAGNOSIS — M438X6 Other specified deforming dorsopathies, lumbar region: Secondary | ICD-10-CM | POA: Diagnosis not present

## 2023-07-05 DIAGNOSIS — G4489 Other headache syndrome: Secondary | ICD-10-CM | POA: Diagnosis not present

## 2023-07-05 NOTE — Discharge Instructions (Addendum)
You were seen in the emergency department today after a fall.  As we discussed all your x-rays and CT scans did not show any broken or dislocated bones, and there is no internal bleeding.  I recommend taking Tylenol as needed for pain.

## 2023-07-05 NOTE — ED Notes (Signed)
Patient is calling family, in attempt to get a ride home.

## 2023-07-05 NOTE — ED Provider Notes (Signed)
[facility] Provider Note   CSN: [id number] Arrival date & time: 07/05/23  1400     History  Chief Complaint  Patient presents with   Samantha [name] is a 89 y.o. female with history of asthma, hyperlipidemia, GERD, hypothyroidism, ventricular ectopy and left bundle branch block s/p pacemaker on Eliquis, who presents the emergency department after a fall.  Per EMS, patient coming from home, fell after her chair slipped out from under her.  They were able to get her off the floor, and applied a c-collar she was complaining of some neck pain.  They noted that she sustained a hematoma to the back of her head.  Patient denied any loss of consciousness.  She now is mainly complaining of a posterior headache, and some pain in her lower back.   Fall Associated symptoms include headaches.       Home Medications Prior to Admission medications   Medication Sig Start Date End Date Taking? Authorizing Provider  albuterol (VENTOLIN HFA) 108 (90 Base) MCG/ACT inhaler Inhale 1-2 puffs into the lungs every 6 (six) hours as needed for wheezing or shortness of breath. 12/19/22   Leslye Peer, MD  apixaban (ELIQUIS) 5 MG TABS tablet Take 1 tablet (5 mg total) by mouth in the morning and at bedtime. 08/10/22   Sherie Don, NP  Calcium-Magnesium-Zinc (CALCIUM-MAGNESUIUM-ZINC PO) Take 1 tablet by mouth every Monday, Wednesday, and Friday.    [provider]  Cholecalciferol (VITAMIN D3) 50 MCG (2000 UT) CAPS Take 2,000 Units by mouth daily.    [provider]  cyanocobalamin (VITAMIN B12) 1000 MCG tablet Take 1,000 mcg by mouth every Monday, Wednesday, and Friday.    [provider]  denosumab (PROLIA) 60 MG/ML SOSY injection Inject 60 mg into the skin every 6 (six) months.    [provider]  furosemide (LASIX) 20 MG tablet TAKE 1 TABLET BY MOUTH ONCE DAILY 02/27/23   Wendall Stade, MD  levothyroxine (SYNTHROID)  125 MCG tablet Take 125 mcg by mouth daily before breakfast. 08/17/20   [provider]  loratadine (CLARITIN) 10 MG tablet Take 10 mg by mouth daily.    [provider]  lovastatin (MEVACOR) 20 MG tablet Take 20 mg by mouth at bedtime.    [provider]  Lubricants (LUBRICATING JELLY) GEL Place 1 application  vaginally daily.    [provider]  metoprolol succinate (TOPROL-XL) 50 MG 24 hr tablet Take 50 mg by mouth at bedtime.    [provider]  montelukast (SINGULAIR) 10 MG tablet Take 10 mg by mouth at bedtime.    [provider]  Multiple Vitamins-Minerals ([employer] ADULT 50+ EYE HEALTH) CAPS Take 1 capsule by mouth daily.    [provider]  mupirocin ointment (BACTROBAN) 2 % Apply 1 application. topically 2 (two) times daily. Apply to area on foot. Patient taking differently: Apply 1 application  topically 2 (two) times daily. To each nostril 11/05/21   Ellsworth Lennox, PA-C  Omega-3 Fatty Acids (FISH OIL) 1000 MG CAPS Take 1,000 mg by mouth daily after breakfast.    [provider]  omeprazole (PRILOSEC) 40 MG capsule Take 40 mg by mouth at bedtime.    [provider]  polyethylene glycol (MIRALAX / GLYCOLAX) packet Take 8.5 g by mouth every other day.    [provider]  potassium chloride (K-DUR) 10 MEQ tablet Take 1 tablet (10 mEq total) by  mouth daily. Patient taking differently: Take 10 mEq by mouth See admin instructions. 10 mEq once daily between 1600-1800. 10/24/15   Wendall Stade, MD  sodium chloride (OCEAN) 0.65 % SOLN nasal spray Place 1 spray into both nostrils 3 (three) times daily as needed (nasal dryness).    [provider]      Allergies    Erythromycin, Vibra-tab [doxycycline], Benzalkonium chloride, Erythromycin, Fosamax [alendronate], Neo-bacit-poly-lidocaine, Neomy-bacit-polymyx-pramoxine, Neosporin [neomycin-bacitracin zn-polymyx], Penicillins, and Zithromax [azithromycin]     Review of Systems   Review of Systems  Musculoskeletal:  Positive for arthralgias, back pain and neck pain.  Neurological:  Positive for headaches.  All other systems reviewed and are negative.   Physical Exam Updated Vital Signs BP (!) 140/80 (BP Location: Right Arm)   Pulse 89   Temp 97.7 F (36.5 C) (Oral)   Resp 16   Ht 5\' 1"  (1.549 m)   Wt 64.5 kg   SpO2 96%   BMI 26.87 kg/m  Physical Exam Vitals and nursing note reviewed.  Constitutional:      Appearance: Normal appearance.  HENT:     Head: Normocephalic.     Comments: Contusion to the right posterior headache Eyes:     Conjunctiva/sclera: Conjunctivae normal.  Cardiovascular:     Rate and Rhythm: Normal rate and regular rhythm.  Pulmonary:     Effort: Pulmonary effort is normal. No respiratory distress.     Breath sounds: Normal breath sounds.  Chest:     Comments: Chest wall stable Abdominal:     General: There is no distension.     Palpations: Abdomen is soft.     Tenderness: There is no abdominal tenderness.  Musculoskeletal:     Comments: Pelvis stable, extremities with soft compartments and no deformity  Skin:    General: Skin is warm and dry.  Neurological:     General: No focal deficit present.     Mental Status: She is alert.     ED Results / Procedures / Treatments   Labs (all labs ordered are listed, but only abnormal results are displayed) Labs Reviewed - No data to display  EKG None  Radiology CT Lumbar Spine Wo Contrast Result Date: 07/05/2023 CLINICAL DATA:  Status post fall.  Back pain. EXAM: CT LUMBAR SPINE WITHOUT CONTRAST TECHNIQUE: Multidetector CT imaging of the lumbar spine was performed without intravenous contrast administration. Multiplanar CT image reconstructions were also generated. RADIATION DOSE REDUCTION: This exam was performed according to the departmental dose-optimization program which includes automated exposure control, adjustment of the mA and/or kV according to  patient size and/or use of iterative reconstruction technique. COMPARISON:  03/30/2022 FINDINGS: Segmentation: 5 lumbar type vertebrae. Alignment: Slight curvature of the lumbar spine is convex towards the left. No significant listhesis identified. Vertebrae: No acute fracture or focal pathologic process. Paraspinal and other soft tissues: Negative. Disc levels: Mild multilevel endplate spurring is noted. This is most severe at the L1-2 level. Posterior disc bulge/herniation noted at L3-4 and L4-5. Other: Gallstones. Aortic atherosclerotic calcifications. Duplicated inferior vena cava. IMPRESSION: 1. No evidence for lumbar spine fracture or subluxation. 2. Lumbar degenerative disc disease. 3. Gallstones. 4.  Aortic Atherosclerosis (ICD10-I70.0). Electronically Signed   By: Signa Kell M.D.   On: 07/05/2023 16:13   DG Shoulder Left Result Date: 07/05/2023 CLINICAL DATA:  Pain EXAM: LEFT SHOULDER - 2+ VIEW COMPARISON:  None Available. FINDINGS: There is no evidence of fracture or dislocation. There is no evidence of significant arthropathy or other focal  bone abnormality. Soft tissues are unremarkable. IMPRESSION: Negative. Electronically Signed   By: Duanne Guess D.O.   On: 07/05/2023 15:10   DG Pelvis Portable Result Date: 07/05/2023 CLINICAL DATA:  Fall EXAM: PORTABLE PELVIS 1-2 VIEWS COMPARISON:  None Available. FINDINGS: There is no evidence of pelvic fracture or diastasis. Degenerative changes of both hips. No pelvic bone lesions are seen. IMPRESSION: Negative. Electronically Signed   By: Duanne Guess D.O.   On: 07/05/2023 15:09   DG Chest Portable 1 View Result Date: 07/05/2023 CLINICAL DATA:  Fall EXAM: PORTABLE CHEST 1 VIEW COMPARISON:  08/08/2022 FINDINGS: Left pacer remains in place, unchanged. Mild cardiomegaly. Mediastinal contours within normal limits. Diffuse interstitial prominence could reflect early interstitial edema. No effusions, pneumothorax or acute bony abnormality. IMPRESSION:  Cardiomegaly. Mild diffuse interstitial prominence may reflect interstitial edema. Electronically Signed   By: Charlett Nose M.D.   On: 07/05/2023 15:02   CT Cervical Spine Wo Contrast Result Date: 07/05/2023 CLINICAL DATA:  Neck trauma (Age >= 65y) EXAM: CT CERVICAL SPINE WITHOUT CONTRAST TECHNIQUE: Multidetector CT imaging of the cervical spine was performed without intravenous contrast. Multiplanar CT image reconstructions were also generated. RADIATION DOSE REDUCTION: This exam was performed according to the departmental dose-optimization program which includes automated exposure control, adjustment of the mA and/or kV according to patient size and/or use of iterative reconstruction technique. COMPARISON:  08/05/2022 FINDINGS: Alignment: Facet joints are aligned without dislocation or traumatic listhesis. Dens and lateral masses are aligned. Skull base and vertebrae: No acute fracture. No primary bone lesion or focal pathologic process. Soft tissues and spinal canal: No prevertebral fluid or swelling. No visible canal hematoma. Disc levels:  Mild cervical spondylosis. Upper chest: Negative. Other: None. IMPRESSION: No acute fracture or traumatic listhesis of the cervical spine. Electronically Signed   By: Duanne Guess D.O.   On: 07/05/2023 14:59   CT Head Wo Contrast Result Date: 07/05/2023 CLINICAL DATA:  Head trauma, minor (Age >= 65y) EXAM: CT HEAD WITHOUT CONTRAST TECHNIQUE: Contiguous axial images were obtained from the base of the skull through the vertex without intravenous contrast. RADIATION DOSE REDUCTION: This exam was performed according to the departmental dose-optimization program which includes automated exposure control, adjustment of the mA and/or kV according to patient size and/or use of iterative reconstruction technique. COMPARISON:  08/05/2022 FINDINGS: Brain: No evidence of acute infarction, hemorrhage, hydrocephalus, extra-axial collection or mass lesion/mass effect. Vascular:  Atherosclerotic calcifications involving the large vessels of the skull base. No unexpected hyperdense vessel. Skull: Normal. Negative for fracture or focal lesion. Sinuses/Orbits: No acute finding. Other: Right occipital scalp hematoma. IMPRESSION: 1. No acute intracranial findings. 2. Right occipital scalp hematoma. No underlying calvarial fracture. Electronically Signed   By: Duanne Guess D.O.   On: 07/05/2023 14:55    Procedures Procedures    Medications Ordered in ED Medications - No data to display  ED Course/ Medical Decision Making/ A&P                                 Medical Decision Making Amount and/or Complexity of Data Reviewed Radiology: ordered.  This patient is a 89 y.o. female  who presents to the ED for concern of fall from chair.   Differential diagnoses prior to evaluation: The emergent differential diagnosis includes, but is not limited to,  fracture, dislocation, intracranial bleeding, internal bleeding. This is not an exhaustive differential.   Past Medical History / Co-morbidities /  Social History: asthma, hyperlipidemia, GERD, hypothyroidism, ventricular ectopy and left bundle branch block s/p pacemaker on Eliquis  Physical Exam: Physical exam performed. The pertinent findings include: Hypertensive, otherwise normal vitals. No distress. Large contusion to right posterior scalp. No cervical midline tenderness. Lower lumbar midline tenderness without step offs or crepitus. Extremities with soft compartments, no deformities. Chest and pelvis stable. Abdomen soft and non-tender.   Lab Tests/Imaging studies: I personally interpreted labs/imaging and the pertinent results include: CT head with right occipital scalp hematoma, chest x-ray with some cardiomegaly, x-rays of pelvis, left shoulder, and CT of lumbar spine all without acute traumatic findings.. I agree with the radiologist interpretation.  Medications: I offered the patient Tylenol for pain, but she  declined.  I have reviewed the patients home medicines and have made adjustments as needed.   Disposition: After consideration of the diagnostic results and the patients response to treatment, I feel that emergency department workup does not suggest an emergent condition requiring admission or immediate intervention beyond what has been performed at this time. The plan is: Discharge to home.  Imaging reassuring.  The patient is safe for discharge and has been instructed to return immediately for worsening symptoms, change in symptoms or any other concerns.  Final Clinical Impression(s) / ED Diagnoses Final diagnoses:  Fall, initial encounter  Acute midline low back pain without sciatica  Contusion of scalp, initial encounter    Rx / DC Orders ED Discharge Orders     None      Portions of this report may have been transcribed using voice recognition software. Every effort was made to ensure accuracy; however, inadvertent computerized transcription errors may be present.    Jeanella Flattery 07/05/23 1622    Durwin Glaze, MD 07/17/23 [phone]
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
## 2023-07-11 ENCOUNTER — Other Ambulatory Visit: Payer: Self-pay

## 2023-07-11 ENCOUNTER — Encounter (HOSPITAL_COMMUNITY): Payer: Self-pay | Admitting: Emergency Medicine

## 2023-07-11 ENCOUNTER — Ambulatory Visit (HOSPITAL_COMMUNITY)
Admission: EM | Admit: 2023-07-11 | Discharge: 2023-07-11 | Disposition: A | Discharge status: Home / Self Care | Payer: Medicare HMO

## 2023-07-11 DIAGNOSIS — J069 Acute upper respiratory infection, unspecified: Secondary | ICD-10-CM | POA: Diagnosis not present

## 2023-07-11 NOTE — ED Provider Notes (Signed)
 [facility]    CSN: [id number] Arrival date & time: 07/11/23  1403      History   Chief Complaint Chief Complaint  Patient presents with   URI    HPI Samantha [name] is a 89 y.o. female.   HPI  She states last [place] she fell and was evaluated in the ED after waiting for 4 hours in the waiting room and was around other people who were likely sick  She states on Wed she started to develop a cold sore on her lips - she has been using Camphofenique for this She has intermittently productive cough that sometimes produces abdominal pain  She reports she is also having chest tightness that makes her need to have deep breaths. She states she does feel a bit SOB   She states she does not normally develop fevers but has had some chills  She has been using nasal saline spray to help with rhinorrhea   Past Medical History:  Diagnosis Date   Arthritis    Asthma    GERD (gastroesophageal reflux disease)    High cholesterol    Hypothyroidism    LBBB (left bundle branch block)    Ventricular ectopy     Patient Active Problem List   Diagnosis Date Noted   Excessive cerumen in both ear canals 06/01/2023   Complete heart block (HCC) 08/05/2022   Nasal dryness 06/11/2022   Anxiety 01/15/2022   Tick bite 10/10/2021   Cellulitis 10/09/2021   Blood clotting disorder (HCC) 11/20/2020   Age-related osteoporosis without current pathological fracture 07/30/2020   Hardening of the aorta (main artery of the heart) (HCC) 07/30/2020   Heart failure (HCC) 07/30/2020   Intestinal malabsorption 07/30/2020   Mild intermittent asthma 07/30/2020   Pure hypercholesterolemia 07/30/2020   Thrombophilia (HCC) 07/30/2020   Neck pain 04/03/2020   Long term (current) use of anticoagulants 03/05/2020   Unilateral primary osteoarthritis, right knee 01/26/2020   BPPV (benign paroxysmal positional vertigo), right 05/13/2019   Anticoagulated 05/13/2019   Epistaxis 05/13/2019   Carpal tunnel  syndrome, right upper limb 04/12/2019   Trigger thumb, left thumb 02/10/2019   Numbness and tingling in right hand 02/10/2019   Pain in left foot 02/10/2019   Closed fracture of right proximal tibia 06/22/2018   Paroxysmal atrial fibrillation (HCC) 05/29/2018   Hypokalemia 05/29/2018   Pain and swelling of right lower extremity 05/28/2018   Closed fracture of shaft of tibia 04/29/2018   Aftercare 04/23/2018   SAH (subarachnoid hemorrhage) (HCC) 04/02/2018   Chronic bilateral low back pain with bilateral sciatica 03/08/2018   Abnormal CT scan, chest 08/08/2016   Hearing loss 09/10/2015   Impacted cerumen of right ear 09/10/2015   Otorrhea of left ear 09/10/2015   Tympanic membrane perforation, left 09/10/2015   Near syncope 01/06/2015   Left leg weakness 01/06/2015   Hyperlipidemia    Hypothyroidism    Ventricular ectopy    Chest pain 05/22/2014   Left bundle branch block 05/22/2014   PVC's (premature ventricular contractions) 05/22/2014   HOARSENESS 02/14/2010   CHEST PAIN, ATYPICAL 02/14/2010   Allergic rhinitis 02/13/2010   G E R D 02/13/2010    Past Surgical History:  Procedure Laterality Date   CARDIOVERSION N/A 11/27/2015   Procedure: CARDIOVERSION;  Surgeon: Maude JAYSON Emmer, MD;  Location: [facility] ENDOSCOPY;  Service: Cardiovascular;  Laterality: N/A;   KNEE ARTHROSCOPY     PACEMAKER IMPLANT N/A 08/07/2022   Procedure: PACEMAKER IMPLANT;  Surgeon: Inocencio, Will  Gladis, MD;  Location: [facility] INVASIVE CV LAB;  Service: Cardiovascular;  Laterality: N/A;   TIBIA IM NAIL INSERTION Right 04/05/2018   Procedure: INTRAMEDULLARY (IM) NAIL TIBIAL;  Surgeon: Sharl Selinda Dover, MD;  Location: [facility] OR;  Service: Orthopedics;  Laterality: Right;    OB History   No obstetric history on file.      Home Medications    Prior to Admission medications   Medication Sig Start Date End Date Taking? Authorizing Provider  albuterol  (VENTOLIN  HFA) 108 (90 Base) MCG/ACT inhaler Inhale 1-2 puffs  into the lungs every 6 (six) hours as needed for wheezing or shortness of breath. 12/19/22   Shelah Lamar RAMAN, MD  apixaban  (ELIQUIS ) 5 MG TABS tablet Take 1 tablet (5 mg total) by mouth in the morning and at bedtime. 08/10/22   Riddle, Suzann, NP  Calcium-Magnesium -Zinc (CALCIUM-MAGNESUIUM-ZINC PO) Take 1 tablet by mouth every Monday, Wednesday, and Friday.    [provider]  Cholecalciferol (VITAMIN D3) 50 MCG (2000 UT) CAPS Take 2,000 Units by mouth daily.    [provider]  cyanocobalamin (VITAMIN B12) 1000 MCG tablet Take 1,000 mcg by mouth every Monday, Wednesday, and Friday.    [provider]  denosumab  (PROLIA ) 60 MG/ML SOSY injection Inject 60 mg into the skin every 6 (six) months.    [provider]  furosemide  (LASIX ) 20 MG tablet TAKE 1 TABLET BY MOUTH ONCE DAILY 02/27/23   Nishan, Peter C, MD  levothyroxine  (SYNTHROID ) 125 MCG tablet Take 125 mcg by mouth daily before breakfast. 08/17/20   [provider]  loratadine  (CLARITIN ) 10 MG tablet Take 10 mg by mouth daily.    [provider]  lovastatin (MEVACOR) 20 MG tablet Take 20 mg by mouth at bedtime.    [provider]  Lubricants (LUBRICATING JELLY) GEL Place 1 application  vaginally daily.    [provider]  metoprolol  succinate (TOPROL -XL) 50 MG 24 hr tablet Take 50 mg by mouth at bedtime.    [provider]  montelukast  (SINGULAIR ) 10 MG tablet Take 10 mg by mouth at bedtime.    [provider]  Multiple Vitamins-Minerals ([employer] ADULT 50+ EYE HEALTH) CAPS Take 1 capsule by mouth daily.    [provider]  mupirocin  ointment (BACTROBAN ) 2 % Apply 1 application. topically 2 (two) times daily. Apply to area on foot. Patient taking differently: Apply 1 application  topically 2 (two) times daily. To each nostril 11/05/21   Lynwood Lenis, PA-C  Omega-3 Fatty Acids (FISH OIL) 1000 MG CAPS Take 1,000 mg by mouth daily after breakfast.    [provider]  omeprazole (PRILOSEC) 40 MG capsule Take 40 mg by mouth at bedtime.    [provider]  polyethylene glycol (MIRALAX  / GLYCOLAX ) packet Take 8.5 g by mouth every other day.    [provider]  potassium chloride  (K-DUR) 10 MEQ tablet Take 1 tablet (10 mEq total) by mouth daily. Patient taking differently: Take 10 mEq by mouth See admin instructions. 10 mEq once daily between 1600-1800. 10/24/15   Nishan, Peter C, MD  sodium chloride  (OCEAN) 0.65 % SOLN nasal spray Place 1 spray into both nostrils 3 (three) times daily as needed (nasal dryness).    [provider]    Family History Family History  Problem Relation Age of Onset   Heart attack Mother    Throat cancer Father    Congestive Heart Failure Sister    Congestive Heart Failure Brother  Social History Social History   Tobacco Use   Smoking status: Never    Passive exposure: Yes   Smokeless tobacco: Never  Vaping Use   Vaping status: Never Used  Substance Use Topics   Alcohol use: No    Alcohol/week: 0.0 standard drinks of alcohol   Drug use: No     Allergies   Erythromycin, Vibra -tab [doxycycline ], Benzalkonium chloride, Erythromycin, Fosamax [alendronate], Neo-bacit-poly-lidocaine , Neomy-bacit-polymyx-pramoxine, Neosporin [neomycin-bacitracin zn-polymyx], Penicillins, and Zithromax [azithromycin]   Review of Systems Review of Systems  Constitutional:  Negative for chills and fever.  HENT:  Positive for rhinorrhea. Negative for congestion, ear pain and sore throat.   Respiratory:  Positive for cough, chest tightness and shortness of breath.   Gastrointestinal:  Negative for diarrhea, nausea and vomiting.  Musculoskeletal:  Positive for myalgias.  Neurological:  Positive for headaches (mild and intermittent). Negative for dizziness and light-headedness.     Physical Exam Triage Vital Signs ED Triage Vitals  Encounter Vitals Group     BP 07/11/23 1517 131/81      Systolic BP Percentile --      Diastolic BP Percentile --      Pulse Rate 07/11/23 1517 87     Resp 07/11/23 1517 18     Temp 07/11/23 1517 (!) 97.2 F (36.2 C)     Temp Source 07/11/23 1517 Oral     SpO2 07/11/23 1517 95 %     Weight --      Height --      Head Circumference --      Peak Flow --      Pain Score 07/11/23 1515 0     Pain Loc --      Pain Education --      Exclude from Growth Chart --    No data found.  Updated Vital Signs BP 131/81 (BP Location: Right Arm)   Pulse 87   Temp (!) 97.2 F (36.2 C) (Oral)   Resp 18   SpO2 95%   Visual Acuity Right Eye Distance:   Left Eye Distance:   Bilateral Distance:    Right Eye Near:   Left Eye Near:    Bilateral Near:     Physical Exam Vitals reviewed.  Constitutional:      General: She is awake.     Appearance: Normal appearance. She is well-developed and well-groomed.  HENT:     Head: Normocephalic and atraumatic.     Right Ear: Hearing, tympanic membrane and ear canal normal.     Left Ear: Hearing and ear canal normal. Tympanic membrane is perforated.     Ears:     Comments: She reports left TM perforation is chronic     Mouth/Throat:     Lips: Pink.     Mouth: Mucous membranes are moist.     Pharynx: Oropharynx is clear. Uvula midline. No pharyngeal swelling, oropharyngeal exudate, posterior oropharyngeal erythema, uvula swelling or postnasal drip.  Cardiovascular:     Rate and Rhythm: Normal rate and regular rhythm.     Pulses: Normal pulses.          Radial pulses are 2+ on the right side and 2+ on the left side.     Heart sounds: Normal heart sounds. No murmur heard.    No friction rub. No gallop.  Pulmonary:     Effort: Pulmonary effort is normal.     Breath sounds: Normal breath sounds. No decreased air movement. No decreased breath sounds, wheezing, rhonchi or  rales.  Musculoskeletal:     Cervical back: Normal range of motion and neck supple.  Lymphadenopathy:     Head:     Right side of  head: No submental, submandibular or preauricular adenopathy.     Left side of head: No submental, submandibular or preauricular adenopathy.     Cervical:     Right cervical: No superficial cervical adenopathy.    Left cervical: No superficial cervical adenopathy.     Upper Body:     Right upper body: No supraclavicular adenopathy.     Left upper body: No supraclavicular adenopathy.  Neurological:     General: No focal deficit present.     Mental Status: She is alert and oriented to person, place, and time.     GCS: GCS eye subscore is 4. GCS verbal subscore is 5. GCS motor subscore is 6.     Cranial Nerves: No cranial nerve deficit, dysarthria or facial asymmetry.  Psychiatric:        Attention and Perception: Attention and perception normal.        Mood and Affect: Mood and affect normal.        Speech: Speech normal.        Behavior: Behavior normal. Behavior is cooperative.      UC Treatments / Results  Labs (all labs ordered are listed, but only abnormal results are displayed) Labs Reviewed - No data to display  EKG   Radiology No results found.  Procedures Procedures (including critical care time)  Medications Ordered in UC Medications - No data to display  Initial Impression / Assessment and Plan / UC Course  I have reviewed the triage vital signs and the nursing notes.  Pertinent labs & imaging results that were available during my care of the patient were reviewed by me and considered in my medical decision making (see chart for details).      Final Clinical Impressions(s) / UC Diagnoses   Final diagnoses:  Viral upper respiratory tract infection   Acute, new concern Patient reports that she is having mild headaches, body aches, coughing, chest tightness and mild shortness of breath that started around Wednesday or Thursday of this week.  She was potentially exposed to sick people when she was in the emergency room last Sunday for a fall.  Reviewed that  she is outside the window for antivirals for her cold sore since this started on Wednesday.  Area does not appear infected or swollen, erythematous at this time. Reviewed vitals and physical exam is reassuring.  At this time I recommend symptomatic management with over-the-counter medications.  Reviewed that she can take Robitussin, Mucinex, Tylenol  as needed for symptom management.  ED and return precautions reviewed and provided in after visit summary.  Recommend follow-up as needed with PCP if symptoms are not improving in the next 5 to 7 days.  Discussed that she should likely follow-up with her PCP soon in regards to her falls to help prevent further episodes.     Discharge Instructions      Based on your described symptoms and the duration of symptoms it is likely that you have a viral upper respiratory infection (often called a cold)  Symptoms can last for 3-10 days with lingering cough and intermittent symptoms lasting weeks after that.  The goal of treatment at this time is to reduce your symptoms and discomfort   I recommend using Robitussin and Mucinex (regular formulations, nothing with decongestants or DM)  You can also use  Tylenol  for body aches and fever reduction I also recommend adding an antihistamine to your daily regimen This includes medications like Claritin , Allegra, Zyrtec- the generics of these work very well and are usually less expensive I recommend using Flonase  nasal spray - 2 puffs twice per day to help with your nasal congestion The antihistamines and Flonase  can take a few weeks to provide significant relief from allergy symptoms but should start to provide some benefit soon. You can use a humidifier at night to help with preventing nasal dryness and irritation   If your symptoms do not improve or become worse in the next 5-7 days please make an apt at the office so we can see you  Go to the ER if you begin to have more serious symptoms such as shortness of  breath, trouble breathing, loss of consciousness, swelling around the eyes, high fever, severe lasting headaches, vision changes or neck pain/stiffness.       ED Prescriptions   None    PDMP not reviewed this encounter.   Marylene Rocky BRAVO, PA-C 07/11/23 1614

## 2023-07-11 NOTE — ED Triage Notes (Signed)
 Last Sunday fell backwards in her kitchen. Reports chair slipped out from her as she tried to get up.  No loc Was seen at ED/worked up and no injury noted  Wednesday noticed a cold sore on lower lip and chest soreness, cough causes a soreness in stomach.  Patient has a slight runny nose, productive cough that is sometimes clear, and sometimes yellow, and has sinus drainage

## 2023-07-11 NOTE — Discharge Instructions (Addendum)

## 2023-07-17 ENCOUNTER — Ambulatory Visit: Payer: Medicare HMO | Attending: Cardiovascular Disease | Admitting: Cardiovascular Disease

## 2023-07-17 ENCOUNTER — Encounter: Payer: Self-pay | Admitting: Cardiovascular Disease

## 2023-07-17 VITALS — BP 110/64 | HR 82 | Ht 61.0 in | Wt 142.6 lb

## 2023-07-17 DIAGNOSIS — Z950 Presence of cardiac pacemaker: Secondary | ICD-10-CM | POA: Diagnosis not present

## 2023-07-17 DIAGNOSIS — I480 Paroxysmal atrial fibrillation: Secondary | ICD-10-CM | POA: Diagnosis not present

## 2023-07-17 DIAGNOSIS — I442 Atrioventricular block, complete: Secondary | ICD-10-CM

## 2023-07-17 NOTE — Patient Instructions (Signed)
Medication Instructions:  Your physician recommends that you continue on your current medications as directed. Please refer to the Current Medication list given to you today.  *If you need a refill on your cardiac medications before your next appointment, please call your pharmacy*  Lab Work: If you have labs (blood work) drawn today and your tests are completely normal, you will receive your results only by: MyChart Message (if you have MyChart) OR A paper copy in the mail If you have any lab test that is abnormal or we need to change your treatment, we will call you to review the results.  Follow-Up: At [facility], you and your health needs are our priority.  As part of our continuing mission to provide you with exceptional heart care, we have created designated Provider Care Teams.  These Care Teams include your primary Cardiologist (physician) and Advanced Practice Providers (APPs -  Physician Assistants and Nurse Practitioners) who all work together to provide you with the care you need, when you need it.  We recommend signing up for the patient portal called "MyChart".  Sign up information is provided on this After Visit Summary.  MyChart is used to connect with patients for Virtual Visits (Telemedicine).  Patients are able to view lab/test results, encounter notes, upcoming appointments, etc.  Non-urgent messages can be sent to your provider as well.   To learn more about what you can do with MyChart, go to [email or web address].    Your next appointment:   1 year(s)  Provider:   Charlton Haws, MD     Other Instructions    1st Floor: - Lobby - Registration  - Pharmacy  - Lab - Cafe  2nd Floor: - PV Lab - Diagnostic Testing (echo, CT, nuclear med)  3rd Floor: - Vacant  4th Floor: - TCTS (cardiothoracic surgery) - AFib Clinic - Structural Heart Clinic - Vascular Surgery  - Vascular Ultrasound  5th Floor: - HeartCare Cardiology (general and EP) -  Clinical Pharmacy for coumadin, hypertension, lipid, weight-loss medications, and med management appointments    Valet parking services will be available as well.

## 2023-08-07 ENCOUNTER — Ambulatory Visit (INDEPENDENT_AMBULATORY_CARE_PROVIDER_SITE_OTHER): Payer: Medicare HMO

## 2023-08-07 DIAGNOSIS — I442 Atrioventricular block, complete: Secondary | ICD-10-CM

## 2023-08-09 LAB — CUP PACEART REMOTE DEVICE CHECK
Battery Remaining Longevity: 100 mo
Battery Remaining Percentage: 93 %
Battery Voltage: 2.99 V
Brady Statistic AP VP Percent: 0 %
Brady Statistic AP VS Percent: 0 %
Brady Statistic AS VP Percent: 0 %
Brady Statistic AS VS Percent: 0 %
Brady Statistic RA Percent Paced: 1 %
Brady Statistic RV Percent Paced: 46 %
Date Time Interrogation Session: 20250307020014
Implantable Lead Connection Status: 753985
Implantable Lead Connection Status: 753985
Implantable Lead Implant Date: 20240307
Implantable Lead Implant Date: 20240307
Implantable Lead Location: 753859
Implantable Lead Location: 753860
Implantable Pulse Generator Implant Date: 20240307
Lead Channel Impedance Value: 360 Ohm
Lead Channel Impedance Value: 490 Ohm
Lead Channel Pacing Threshold Amplitude: 0.5 V
Lead Channel Pacing Threshold Amplitude: 0.75 V
Lead Channel Pacing Threshold Pulse Width: 0.5 ms
Lead Channel Pacing Threshold Pulse Width: 0.5 ms
Lead Channel Sensing Intrinsic Amplitude: 1.8 mV
Lead Channel Sensing Intrinsic Amplitude: 4.9 mV
Lead Channel Setting Pacing Amplitude: 2.5 V
Lead Channel Setting Pacing Amplitude: 2.5 V
Lead Channel Setting Pacing Pulse Width: 0.5 ms
Lead Channel Setting Sensing Sensitivity: 2 mV
Pulse Gen Model: 2272
Pulse Gen Serial Number: 8163768

## 2023-08-27 DIAGNOSIS — H6123 Impacted cerumen, bilateral: Secondary | ICD-10-CM | POA: Diagnosis not present

## 2023-09-01 ENCOUNTER — Encounter: Payer: Self-pay | Admitting: Podiatry

## 2023-09-01 ENCOUNTER — Ambulatory Visit: Payer: Medicare HMO | Admitting: Podiatry

## 2023-09-01 VITALS — Ht 61.0 in | Wt 142.0 lb

## 2023-09-01 DIAGNOSIS — M79676 Pain in unspecified toe(s): Secondary | ICD-10-CM | POA: Diagnosis not present

## 2023-09-01 DIAGNOSIS — B351 Tinea unguium: Secondary | ICD-10-CM | POA: Diagnosis not present

## 2023-09-01 DIAGNOSIS — I739 Peripheral vascular disease, unspecified: Secondary | ICD-10-CM | POA: Diagnosis not present

## 2023-09-03 ENCOUNTER — Encounter: Payer: Self-pay | Admitting: Emergency Medicine

## 2023-09-03 ENCOUNTER — Ambulatory Visit: Payer: Medicare HMO | Admitting: Emergency Medicine

## 2023-09-03 VITALS — BP 110/62 | HR 82 | Ht 61.0 in | Wt 142.0 lb

## 2023-09-03 DIAGNOSIS — J301 Allergic rhinitis due to pollen: Secondary | ICD-10-CM | POA: Diagnosis not present

## 2023-09-03 DIAGNOSIS — J4520 Mild intermittent asthma, uncomplicated: Secondary | ICD-10-CM

## 2023-09-03 DIAGNOSIS — K219 Gastro-esophageal reflux disease without esophagitis: Secondary | ICD-10-CM

## 2023-09-03 NOTE — Progress Notes (Signed)
   Subjective:    Patient ID: Samantha [name], female    DOB: 06-06-1929, 89 y.o.   MRN: [id number]  HPI  ROV 12/19/22 -- follow up for 89 yo woman, mild intermittent asthma, allergies, GERD, A Fib. 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  Reports today that she is able to be active, but she cannot do all the things she wants to do On loratadine, singulair, omeprazole. She has not needed albuterol in many months. She does get some SOB w walking a distance, better quickly with rest. No cough or wheeze.   ROV 09/03/2023 --89 year old woman with a history of allergic rhinitis, mild intermittent asthma, A-fib with pacer, GERD.  Currently managed on albuterol as needed and Singulair.  On loratadine and omeprazole Today she reports that she is doing well - has not been needing her albuterol. She has not had any flares since last visit. Flu and RSV up to date, has not had the most recent covid shot. A bit lest active - gets limited by her LE strength and balance.  No real cough although she does have nasal drainage. Her GERD is well-controlled.    Review of Systems  Constitutional:  Negative for fever and unexpected weight change.  HENT:  Negative for congestion, dental problem, ear pain, nosebleeds, postnasal drip, rhinorrhea, sinus pressure, sneezing, sore throat and trouble swallowing.   Eyes:  Negative for redness and itching.  Respiratory:  Positive for shortness of breath. Negative for cough, chest tightness and wheezing.   Cardiovascular:  Negative for palpitations and leg swelling.  Gastrointestinal:  Negative for nausea and vomiting.  Genitourinary:  Negative for dysuria.  Musculoskeletal:  Negative for joint swelling.  Skin:  Negative for rash.  Neurological:  Negative for headaches.  Hematological:  Bruises/bleeds easily.  Psychiatric/Behavioral:  Negative for dysphoric mood. The patient is not nervous/anxious.         Objective:    Physical Exam Vitals:   09/03/23 1343  BP: 110/62  Pulse: 82  SpO2: 97%  Weight: 142 lb (64.4 kg)  Height: 5\' 1"  (1.549 m)   Gen: Pleasant, elderly overwt, in no distress,  normal affect  ENT: No lesions,  mouth clear,  oropharynx clear, no postnasal drip  Neck: No JVD, no stridor  Lungs: No use of accessory muscles, clear without rales or rhonchi, no crackles  Cardiovascular: irregular,  no peripheral edema  Musculoskeletal: No deformities, no cyanosis or clubbing  Neuro: alert, non focal  Skin: Warm, no lesions or rashes       Assessment & Plan:  Mild intermittent asthma Please continue your Singulair 10 mg each evening Keep your albuterol available to use 2 puffs if needed for shortness of breath, chest tightness, wheezing. Keep your flu shot, COVID-19 vaccine up-to-date.  You have had the RSV vaccine Follow with Dr. Delton Coombes in 12 months or sooner if you have any problems.   Allergic rhinitis Continue your loratadine 10 mg once daily  G E R D Continue omeprazole each evening as you have been taking it    Levy Pupa, MD, PhD 09/03/2023, 2:00 PM [facility] [phone] or if no answer [phone]

## 2023-09-03 NOTE — Assessment & Plan Note (Signed)
Continue your loratadine 10 mg once daily.

## 2023-09-03 NOTE — Progress Notes (Signed)
  Subjective:  Patient ID: Samantha [name], female    DOB: 05-25-1930,  MRN: [id number]  Samantha [name] presents to clinic today for at risk foot care. Patient has h/o PAD and painful thick toenails that are difficult to trim. Pain interferes with ambulation. Aggravating factors include wearing enclosed shoe gear. Pain is relieved with periodic professional debridement.  Chief Complaint  Patient presents with   RFC    She is here for a nail trim. PCP is dr Margaretann Loveless,    New problem(s): None.   PCP is Thana Ates, MD.  Allergies  Allergen Reactions   Erythromycin Diarrhea and Rash   Vibra-Tab [Doxycycline] Hives   Benzalkonium Chloride Rash   Erythromycin Rash   Fosamax [Alendronate] Nausea Only and Other (See Comments)    Severe stomach cramps   Neo-Bacit-Poly-Lidocaine Rash   Neomy-Bacit-Polymyx-Pramoxine Rash   Neosporin [Neomycin-Bacitracin Zn-Polymyx] Rash   Penicillins Rash    DID THE REACTION INVOLVE: Swelling of the face/tongue/throat, SOB, or low BP? N Sudden or severe rash/hives, skin peeling, or the inside of the mouth or nose? Y Did it require medical treatment? N When did it last happen?      Childhood If all above answers are "NO", may proceed with cephalosporin use.   Zithromax [Azithromycin] Diarrhea    Review of Systems: Negative except as noted in the HPI.  Objective: No changes noted in today's physical examination. There were no vitals filed for this visit. Samantha [name] is a pleasant 89 y.o. female WD, WN in NAD. AAO x 3.  Vascular Examination: Capillary refill time to digits immediate b/l. Palpable DP pulse(s) b/l LE. Diminished PT pulse(s) b/l LE. Pedal hair sparse. No pain with calf compression b/l. Edema left ankle. Varicosities present b/l. No cyanosis or clubbing noted b/l LE.Marland Kitchen  Dermatological Examination: Pedal skin with normal turgor, texture and tone b/l. No open wounds. No interdigital macerations b/l. Toenails 1-5 b/l thickened, discolored,  dystrophic with subungual debris. There is pain on palpation to dorsal aspect of nailplates. Minimal hyperkeratos(is/es) noted R 3rd toe.  Neurological Examination: Protective sensation intact with 10 gram monofilament b/l LE. Vibratory sensation intact b/l LE.   Musculoskeletal Examination: Muscle strength 5/5 to all lower extremity muscle groups bilaterally. Tailor's bunion deformity noted b/l LE. Hammertoe deformity noted 2-5 b/l. Utilizes cane for ambulation assistance.  Assessment/Plan: 1. Pain due to onychomycosis of toenail   2. PAD (peripheral artery disease) (HCC)   Consent given for treatment. Patient examined. All patient's and/or POA's questions/concerns addressed on today's visit.Toenails 1-5 debrided in length and girth without incident. Continue soft, supportive shoe gear daily. Report any pedal injuries to medical professional. Call office if there are any questions/concerns. -Patient/POA to call should there be question/concern in the interim.   Return in about 3 months (around 12/01/2023).  Samantha [name], DPM      [facility] LOCATION: [address] [place] [address]                   Office [phone]   [facility] LOCATION: [address] [place], [place] [address] Office [phone]

## 2023-09-03 NOTE — Assessment & Plan Note (Signed)
 Please continue your Singulair 10 mg each evening Keep your albuterol available to use 2 puffs if needed for shortness of breath, chest tightness, wheezing. Keep your flu shot, COVID-19 vaccine up-to-date.  You have had the RSV vaccine Follow with Dr. Delton Coombes in 12 months or sooner if you have any problems.

## 2023-09-03 NOTE — Patient Instructions (Signed)
 Please continue your Singulair 10 mg each evening Keep your albuterol available to use 2 puffs if needed for shortness of breath, chest tightness, wheezing. Keep your flu shot, COVID-19 vaccine up-to-date.  You have had the RSV vaccine Continue your loratadine 10 mg once daily Continue omeprazole each evening as you have been taking it Follow with Dr. Delton Coombes in 12 months or sooner if you have any problems.

## 2023-09-03 NOTE — Assessment & Plan Note (Signed)
 Continue omeprazole each evening as you have been taking it

## 2023-09-08 NOTE — Progress Notes (Signed)
 Remote pacemaker transmission.

## 2023-09-08 NOTE — Addendum Note (Signed)
 Addended by: Elease Etienne A on: 09/08/2023 11:57 AM   Modules accepted: Orders

## 2023-09-16 DIAGNOSIS — N1831 Chronic kidney disease, stage 3a: Secondary | ICD-10-CM | POA: Diagnosis not present

## 2023-09-23 DIAGNOSIS — D2371 Other benign neoplasm of skin of right lower limb, including hip: Secondary | ICD-10-CM | POA: Diagnosis not present

## 2023-09-23 DIAGNOSIS — Z85828 Personal history of other malignant neoplasm of skin: Secondary | ICD-10-CM | POA: Diagnosis not present

## 2023-09-23 DIAGNOSIS — L814 Other melanin hyperpigmentation: Secondary | ICD-10-CM | POA: Diagnosis not present

## 2023-09-23 DIAGNOSIS — D1801 Hemangioma of skin and subcutaneous tissue: Secondary | ICD-10-CM | POA: Diagnosis not present

## 2023-09-23 DIAGNOSIS — L570 Actinic keratosis: Secondary | ICD-10-CM | POA: Diagnosis not present

## 2023-09-23 DIAGNOSIS — L820 Inflamed seborrheic keratosis: Secondary | ICD-10-CM | POA: Diagnosis not present

## 2023-09-23 DIAGNOSIS — D2239 Melanocytic nevi of other parts of face: Secondary | ICD-10-CM | POA: Diagnosis not present

## 2023-09-23 DIAGNOSIS — L821 Other seborrheic keratosis: Secondary | ICD-10-CM | POA: Diagnosis not present

## 2023-09-24 DIAGNOSIS — M810 Age-related osteoporosis without current pathological fracture: Secondary | ICD-10-CM | POA: Diagnosis not present

## 2023-10-27 DIAGNOSIS — H938X3 Other specified disorders of ear, bilateral: Secondary | ICD-10-CM | POA: Diagnosis not present

## 2023-10-27 DIAGNOSIS — H7292 Unspecified perforation of tympanic membrane, left ear: Secondary | ICD-10-CM | POA: Diagnosis not present

## 2023-10-31 DIAGNOSIS — E7800 Pure hypercholesterolemia, unspecified: Secondary | ICD-10-CM | POA: Diagnosis not present

## 2023-10-31 DIAGNOSIS — E039 Hypothyroidism, unspecified: Secondary | ICD-10-CM | POA: Diagnosis not present

## 2023-10-31 DIAGNOSIS — I480 Paroxysmal atrial fibrillation: Secondary | ICD-10-CM | POA: Diagnosis not present

## 2023-10-31 DIAGNOSIS — I5032 Chronic diastolic (congestive) heart failure: Secondary | ICD-10-CM | POA: Diagnosis not present

## 2023-11-06 ENCOUNTER — Ambulatory Visit (INDEPENDENT_AMBULATORY_CARE_PROVIDER_SITE_OTHER): Payer: Medicare HMO

## 2023-11-06 ENCOUNTER — Telehealth: Payer: Self-pay | Admitting: Cardiology

## 2023-11-06 DIAGNOSIS — I442 Atrioventricular block, complete: Secondary | ICD-10-CM

## 2023-11-06 NOTE — Telephone Encounter (Signed)
  1. Has your device fired? no  2. Is you device beeping? no  3. Are you experiencing draining or swelling at device site? No   4. Are you calling to see if we received your device transmission? Pt is out of town and that is why we have not received her transmission. Pt will be back home over the weekend.  5. Have you passed out? no   Please route to Device Clinic Pool

## 2023-11-09 LAB — CUP PACEART REMOTE DEVICE CHECK
Battery Remaining Longevity: 97 mo
Battery Remaining Percentage: 90 %
Battery Voltage: 2.99 V
Brady Statistic AP VP Percent: 0 %
Brady Statistic AP VS Percent: 0 %
Brady Statistic AS VP Percent: 0 %
Brady Statistic AS VS Percent: 0 %
Brady Statistic RA Percent Paced: 1 %
Brady Statistic RV Percent Paced: 46 %
Date Time Interrogation Session: 20250607235414
Implantable Lead Connection Status: 753985
Implantable Lead Connection Status: 753985
Implantable Lead Implant Date: 20240307
Implantable Lead Implant Date: 20240307
Implantable Lead Location: 753859
Implantable Lead Location: 753860
Implantable Pulse Generator Implant Date: 20240307
Lead Channel Impedance Value: 360 Ohm
Lead Channel Impedance Value: 490 Ohm
Lead Channel Pacing Threshold Amplitude: 0.5 V
Lead Channel Pacing Threshold Amplitude: 0.75 V
Lead Channel Pacing Threshold Pulse Width: 0.5 ms
Lead Channel Pacing Threshold Pulse Width: 0.5 ms
Lead Channel Sensing Intrinsic Amplitude: 1.8 mV
Lead Channel Sensing Intrinsic Amplitude: 5.4 mV
Lead Channel Setting Pacing Amplitude: 2.5 V
Lead Channel Setting Pacing Amplitude: 2.5 V
Lead Channel Setting Pacing Pulse Width: 0.5 ms
Lead Channel Setting Sensing Sensitivity: 2 mV
Pulse Gen Model: 2272
Pulse Gen Serial Number: 8163768

## 2023-11-09 NOTE — Telephone Encounter (Signed)
 New Message:     Patient is calling back to reschedule remote appointment please.

## 2023-11-10 NOTE — Telephone Encounter (Signed)
 I spoke with the pt to let her know we received her transmission.

## 2023-11-16 ENCOUNTER — Ambulatory Visit: Payer: Self-pay | Admitting: Cardiology

## 2023-11-18 ENCOUNTER — Ambulatory Visit: Payer: Medicare HMO | Attending: Cardiology | Admitting: Cardiology

## 2023-11-18 ENCOUNTER — Encounter: Payer: Self-pay | Admitting: Cardiology

## 2023-11-18 VITALS — BP 127/79 | HR 86 | Ht 61.0 in | Wt 140.4 lb

## 2023-11-18 DIAGNOSIS — I4819 Other persistent atrial fibrillation: Secondary | ICD-10-CM | POA: Diagnosis not present

## 2023-11-18 DIAGNOSIS — I447 Left bundle-branch block, unspecified: Secondary | ICD-10-CM | POA: Diagnosis not present

## 2023-11-18 DIAGNOSIS — R55 Syncope and collapse: Secondary | ICD-10-CM

## 2023-11-18 DIAGNOSIS — I442 Atrioventricular block, complete: Secondary | ICD-10-CM | POA: Diagnosis not present

## 2023-11-18 LAB — CUP PACEART INCLINIC DEVICE CHECK
Battery Remaining Longevity: 108 mo
Battery Voltage: 2.99 V
Brady Statistic RA Percent Paced: 0.01 %
Brady Statistic RV Percent Paced: 46 %
Date Time Interrogation Session: 20250618153638
Implantable Lead Connection Status: 753985
Implantable Lead Connection Status: 753985
Implantable Lead Implant Date: 20240307
Implantable Lead Implant Date: 20240307
Implantable Lead Location: 753859
Implantable Lead Location: 753860
Implantable Pulse Generator Implant Date: 20240307
Lead Channel Impedance Value: 375 Ohm
Lead Channel Impedance Value: 537.5 Ohm
Lead Channel Pacing Threshold Amplitude: 0.75 V
Lead Channel Pacing Threshold Amplitude: 0.75 V
Lead Channel Pacing Threshold Pulse Width: 0.5 ms
Lead Channel Pacing Threshold Pulse Width: 0.5 ms
Lead Channel Sensing Intrinsic Amplitude: 3.9 mV
Lead Channel Sensing Intrinsic Amplitude: 5.8 mV
Lead Channel Setting Pacing Amplitude: 2.5 V
Lead Channel Setting Pacing Amplitude: 2.5 V
Lead Channel Setting Pacing Pulse Width: 0.5 ms
Lead Channel Setting Sensing Sensitivity: 2 mV
Pulse Gen Model: 2272
Pulse Gen Serial Number: 8163768

## 2023-11-18 NOTE — Patient Instructions (Signed)
 Medication Instructions:  Your physician recommends that you continue on your current medications as directed. Please refer to the Current Medication list given to you today.  *If you need a refill on your cardiac medications before your next appointment, please call your pharmacy*  Lab Work: None ordered  If you have any lab test that is abnormal or we need to change your treatment, we will call you to review the results.  Testing/Procedures: None ordered  Follow-Up: At [facility], you and your health needs are our priority.  As part of our continuing mission to provide you with exceptional heart care, our providers are all part of one team.  This team includes your primary Cardiologist (physician) and Advanced Practice Providers or APPs (Physician Assistants and Nurse Practitioners) who all work together to provide you with the care you need, when you need it.  Your next appointment:   6 month(s)  Provider:   You will follow up in the Atrial Fibrillation Clinic located at [facility]. Your provider will be: Clint R. Fenton, PA-C or Minnie Amber, PA-C    We recommend signing up for the patient portal called MyChart.  Sign up information is provided on this After Visit Summary.  MyChart is used to connect with patients for Virtual Visits (Telemedicine).  Patients are able to view lab/test results, encounter notes, upcoming appointments, etc.  Non-urgent messages can be sent to your provider as well.   To learn more about what you can do with MyChart, go to [email or web address].   Thank you for choosing [facility]!!   Samantha Cane, RN [phone]   Other Instructions  Please review the following information and let us  know your decision:  Electrical Cardioversion Electrical cardioversion is the delivery of a jolt of electricity to restore a normal rhythm to the heart. A rhythm that is too fast or is not regular (arrhythmia) keeps the heart from  pumping blood well. There is also another type of cardioversion called a chemical (pharmacologic) cardioversion. This is when your health care provider gives you one or more medicines to bring back your regular heart rhythm. Electrical cardioversion is done as a scheduled procedure for arrhythmiasthat are not life-threatening. Electrical cardioversion may also be done in an emergency for sudden life-threatening arrhythmias. Tell a health care provider about: Any allergies you have. All medicines you are taking, including vitamins, herbs, eye drops, creams, and over-the-counter medicines. Any problems you or family members have had with sedatives or anesthesia. Any bleeding problems you have. Any surgeries you have had, including a pacemaker, defibrillator, or other implanted device. Any medical conditions you have. Whether you are pregnant or may be pregnant. What are the risks? Your provider will talk with you about risks. These include: Allergic reactions to medicines. Irritation to the skin on your chest or back where the sticky pads (electrodes) or paddles were put during electrical cardioversion. A blood clot that breaks free and travels to other parts of your body, such as your brain. Return of a worse abnormal heart rhythm that will need to be treated with medicines, a pacemaker, or an implantable cardioverter defibrillator (ICD). What happens before the procedure? Medicines Your provider may give you: Blood-thinning medicines (anticoagulants) so your blood does not clot as easily. If your provider gives you this medicine, you may need to take it for 4 weeks before the procedure. Medicines to help stabilize your heart rate and rhythm. Ask your provider about: Changing or stopping your regular medicines. These include  any diabetes medicines or blood thinners you take. Taking medicines such as aspirin  and ibuprofen. These medicines can thin your blood. Do not take them unless your  provider tells you to. Taking over-the-counter medicines, vitamins, herbs, and supplements. General instructions Follow instructions from your provider about what you may eat and drink. Do not put any lotions, powders, or ointments on your chest and back for 24 hours before the procedure. They can cause problems with the electrodes or paddles used to deliver electricity to your heart. Do not wear jewelry as this can interfere with delivering electricity to your heart. If you will be going home right after the procedure, plan to have a responsible adult: Take you home from the hospital or clinic. You will not be allowed to drive. Care for you for the time you are told. Tests You may have an exam or testing. This may include: Blood labs. A transesophageal echocardiogram (TEE). What happens during the procedure?     An IV will be inserted into one of your veins. You will be given a sedative. This helps you relax. Electrodes or metal paddles will be placed on your chest. They may be placed in one of these ways: One placed on your right chest, the other on the left ribs. One placed on your chest and the other on your back. An electrical shock will be delivered. The shock briefly stops (resets) your heart rhythm. Your provider will check to see if your heart rhythm is now normal. Some people need only one shock. Some need more to restore a normal heart rhythm. The procedure may vary among providers and hospitals. What happens after the procedure? Your blood pressure, heart rate, breathing rate, and blood oxygen  level will be monitored until you leave the hospital or clinic. Your heart rhythm will be watched to make sure it does not change. This information is not intended to replace advice given to you by your health care provider. Make sure you discuss any questions you have with your health care provider. Document Revised: 01/09/2022 Document Reviewed: 01/09/2022 Elsevier Patient Education   2024 Elsevier Inc.   Amiodarone  Tablets What is this medication? AMIODARONE  (a MEE oh da rone) prevents and treats a fast or irregular heartbeat (arrhythmia). It works by slowing down overactive electric signals in the heart, which stabilizes your heart rhythm. It belongs to a group of medications called antiarrhythmics. This medicine may be used for other purposes; ask your health care provider or pharmacist if you have questions. COMMON BRAND NAME(S): Cordarone , Pacerone  What should I tell my care team before I take this medication? They need to know if you have any of these conditions: Liver disease Lung disease Other heart problems Thyroid  disease An unusual or allergic reaction to amiodarone , iodine , other medications, foods, dyes, or preservatives Pregnant or trying to get pregnant Breast-feeding How should I use this medication? Take this medication by mouth with water. Take it as directed on the prescription label at the same time every day. You can take it with or without food. You should always take it the same way. Keep taking it unless your care team tells you to stop. A special MedGuide will be given to you by the pharmacist with each prescription and refill. Be sure to read this information carefully each time. Talk to your care team about the use of this medication in children. Special care may be needed. Overdosage: If you think you have taken too much of this medicine contact a poison control  center or emergency room at once. NOTE: This medicine is only for you. Do not share this medicine with others. What if I miss a dose? If you miss a dose, take it as soon as you can. If it is almost time for your next dose, take only that dose. Do not take double or extra doses. What may interact with this medication? Do not take this medication with any of the following: Abarelix Apomorphine Arsenic trioxide Certain antibiotics, such as erythromycin, gemifloxacin, levofloxacin,  or pentamidine Certain medications for depression, such as amoxapine or tricyclic antidepressants Certain medications for fungal infections, such as fluconazole, itraconazole, ketoconazole, posaconazole, or voriconazole Certain medications for irregular heartbeat, such as disopyramide, dronedarone , ibutilide, propafenone, or sotalol Certain medications for malaria, such as chloroquine or halofantrine Cisapride Droperidol Haloperidol [place]orn Maprotiline Methadone Phenothiazines, such as chlorpromazine, mesoridazine, or thioridazine Pimozide Ranolazine Red yeast rice Vardenafil This medication may also interact with the following: Antivirals for HIV Certain medications for blood pressure, heart disease, irregular heartbeat Certain medications for cholesterol, such as atorvastatin, cerivastatin, lovastatin, or simvastatin Certain medications for hepatitis C, such as sofosbuvir and ledipasvir; sofosbuvir Certain medications for seizures, such as phenytoin Certain medications for thyroid  problems Certain medications that prevent or treat blood clots, such as warfarin Cholestyramine Cimetidine Clopidogrel Cyclosporine Dextromethorphan Diuretics Dofetilide Fentanyl  General anesthetics Grapefruit juice Lidocaine  Loratadine  Methotrexate Other medications that cause heart rhythm changes Procainamide Quinidine Rifabutin, rifampin, or rifapentine St. John's Wort Trazodone Ziprasidone This list may not describe all possible interactions. Give your health care provider a list of all the medicines, herbs, non-prescription drugs, or dietary supplements you use. Also tell them if you smoke, drink alcohol, or use illegal drugs. Some items may interact with your medicine. What should I watch for while using this medication? Your condition will be monitored closely when you first begin therapy. This medication is often started in a hospital or other monitored health care setting. Once you  are on maintenance therapy, visit your care team for regular checks on your progress. Because your condition and use of this medication carry some risk, it is a good idea to carry an identification card, necklace, or bracelet with details of your condition, medications, and care team. This medication may affect your coordination, reaction time, or judgment. Do not drive or operate machinery until you know how this medication affects you. Sit up or stand slowly to reduce the risk of dizzy or fainting spells. Drinking alcohol with this medication can increase the risk of these side effects. This medication can make you more sensitive to the sun. Keep out of the sun. If you cannot avoid being in the sun, wear protective clothing and sunscreen. Do not use sun lamps, tanning beds, or tanning booths. You should have regular eye exams before and during treatment. Call your care team if you have blurred vision, see halos, or your eyes become sensitive to light. Your eyes may get dry. It may be helpful to use a lubricating eye solution or artificial tears solution. If you are going to have surgery or a procedure that requires contrast dyes, tell your care team that you are taking this medication. What side effects may I notice from receiving this medication? Side effects that you should report to your care team as soon as possible: Allergic reactions--skin rash, itching, hives, swelling of the face, lips, tongue, or throat Bluish-gray skin Change in vision such as blurry vision, seeing halos around lights, vision loss Heart failure--shortness of breath, swelling of the ankles,  feet, or hands, sudden weight gain, unusual weakness or fatigue Heart rhythm changes--fast or irregular heartbeat, dizziness, feeling faint or lightheaded, chest pain, trouble breathing High thyroid  levels (hyperthyroidism)--fast or irregular heartbeat, weight loss, excessive sweating or sensitivity to heat, tremors or shaking, anxiety,  nervousness, irregular menstrual cycle or spotting Liver injury--right upper belly pain, loss of appetite, nausea, light-colored stool, dark yellow or brown urine, yellowing skin or eyes, unusual weakness or fatigue Low thyroid  levels (hypothyroidism)--unusual weakness or fatigue, sensitivity to cold, constipation, hair loss, dry skin, weight gain, feelings of depression Lung injury--shortness of breath or trouble breathing, cough, spitting up blood, chest pain, fever Pain, tingling, or numbness in the hands or feet, muscle weakness, trouble walking, loss of balance or coordination Side effects that usually do not require medical attention (report to your care team if they continue or are bothersome): Nausea Vomiting This list may not describe all possible side effects. Call your doctor for medical advice about side effects. You may report side effects to FDA at 1-800-FDA-1088. Where should I keep my medication? Keep out of the reach of children and pets. Store at room temperature between 20 and 25 degrees C (68 and 77 degrees F). Protect from light. Keep container tightly closed. Throw away any unused medication after the expiration date. NOTE: This sheet is a summary. It may not cover all possible information. If you have questions about this medicine, talk to your doctor, pharmacist, or health care provider.  2024 Elsevier/Gold Standard (2021-12-06 00:00:00)

## 2023-11-18 NOTE — Progress Notes (Signed)
  Electrophysiology Office Note:   Date:  11/18/2023  ID:  Samantha [name], DOB 11-02-1929, MRN [id number]  Primary Cardiologist: Janelle Mediate, MD Primary Heart Failure: None Electrophysiologist: Sheniece Ruggles Cortland Ding, MD      History of Present Illness:   Samantha [name] is a 89 y.o. female with h/o complete heart block, atrial fibrillation, hyperlipidemia seen today for routine electrophysiology followup.   Since last being seen in our clinic the patient reports doing for the most part well.  She has no acute complaints.  She does have some fatigue and weakness.  She is able to do her daily activities but has to do them more slowly.  she denies chest pain, palpitations, dyspnea, PND, orthopnea, nausea, vomiting, dizziness, syncope, edema, weight gain, or early satiety.   Review of systems complete and found to be negative unless listed in HPI.      EP Information / Studies Reviewed:    EKG is ordered today. Personal review as below.      PPM Interrogation-  reviewed in detail today,  See PACEART report.  Device History: Abbott Dual Chamber PPM implanted 08/07/2022 for CHB  Risk Assessment/Calculations:    CHA2DS2-VASc Score = 3   This indicates a 3.2% annual risk of stroke. The patient's score is based upon: CHF History: 0 HTN History: 0 Diabetes History: 0 Stroke History: 0 Vascular Disease History: 0 Age Score: 2 Gender Score: 1            Physical Exam:   VS:  There were no vitals taken for this visit.   Wt Readings from Last 3 Encounters:  09/03/23 142 lb (64.4 kg)  09/01/23 142 lb (64.4 kg)  07/17/23 142 lb 9.6 oz (64.7 kg)     GEN: Well nourished, well developed in no acute distress NECK: No JVD; No carotid bruits CARDIAC: Irregularly irregular rate and rhythm, no murmurs, rubs, gallops RESPIRATORY:  Clear to auscultation without rales, wheezing or rhonchi  ABDOMEN: Soft, non-tender, non-distended EXTREMITIES:  No edema; No deformity   ASSESSMENT AND PLAN:     CHB s/p Abbott PPM  Normal PPM function See Pace Art report Sensing, threshold, impedance within normal limits Programming appropriate No changes today  2.  Persistent atrial fibrillation: She is in atrial fibrillation today.  She feels fatigued and short of breath.  She has no chest pain.  She would potentially like a rhythm control strategy.  I discussed amiodarone  and cardioversion.  She Arlean Thies discuss it further with her family and let us  know what she chooses.  3.  Secondary hypercoagulable state: On Eliquis   Disposition:   Follow up with EP APP in 6 months  Signed, Debbera Wolken Cortland Ding, MD

## 2023-11-19 ENCOUNTER — Ambulatory Visit: Payer: Self-pay | Admitting: Cardiology

## 2023-11-30 DIAGNOSIS — I5032 Chronic diastolic (congestive) heart failure: Secondary | ICD-10-CM | POA: Diagnosis not present

## 2023-11-30 DIAGNOSIS — I480 Paroxysmal atrial fibrillation: Secondary | ICD-10-CM | POA: Diagnosis not present

## 2023-11-30 DIAGNOSIS — E7800 Pure hypercholesterolemia, unspecified: Secondary | ICD-10-CM | POA: Diagnosis not present

## 2023-11-30 DIAGNOSIS — E039 Hypothyroidism, unspecified: Secondary | ICD-10-CM | POA: Diagnosis not present

## 2023-12-02 ENCOUNTER — Ambulatory Visit: Admitting: Podiatry

## 2023-12-02 DIAGNOSIS — M79676 Pain in unspecified toe(s): Secondary | ICD-10-CM | POA: Diagnosis not present

## 2023-12-02 DIAGNOSIS — B351 Tinea unguium: Secondary | ICD-10-CM | POA: Diagnosis not present

## 2023-12-02 DIAGNOSIS — I739 Peripheral vascular disease, unspecified: Secondary | ICD-10-CM | POA: Diagnosis not present

## 2023-12-02 DIAGNOSIS — D689 Coagulation defect, unspecified: Secondary | ICD-10-CM

## 2023-12-02 NOTE — Progress Notes (Signed)
  Subjective:  Patient ID: Samantha [name], female    DOB: 1929/09/02,  MRN: [id number]  Samantha [name] presents to clinic today for at risk foot care. Patient has h/o PAD and painful thick toenails that are difficult to trim. Pain interferes with ambulation. Aggravating factors include wearing enclosed shoe gear. Pain is relieved with periodic professional debridement.  Chief Complaint  Patient presents with   RFC    RM#23 RFC ankle concerns no pain just discoloration of skin around ankles.   New problem(s): None.   PCP is Dwight Trula SQUIBB, MD.  Allergies  Allergen Reactions   Erythromycin Diarrhea and Rash   Vibra -Tab [Doxycycline ] Hives   Benzalkonium Chloride Rash   Erythromycin Rash   Fosamax [Alendronate] Nausea Only and Other (See Comments)    Severe stomach cramps   Neo-Bacit-Poly-Lidocaine  Rash   Neomy-Bacit-Polymyx-Pramoxine Rash   Neosporin [Neomycin-Bacitracin Zn-Polymyx] Rash   Penicillins Rash    DID THE REACTION INVOLVE: Swelling of the face/tongue/throat, SOB, or low BP? N Sudden or severe rash/hives, skin peeling, or the inside of the mouth or nose? Y Did it require medical treatment? N When did it last happen?      Childhood If all above answers are NO, may proceed with cephalosporin use.   Zithromax [Azithromycin] Diarrhea    Review of Systems: Negative except as noted in the HPI.  Objective:   There were no vitals filed for this visit.  Samantha [name] is a pleasant 89 y.o. female WD, WN in NAD. AAO x 3. Vascular Capillary refill time to digits immediate b/l. Palpable DP pulse(s) b/l LE. Diminished PT pulse(s) b/l LE. Pedal hair sparse. No pain with calf compression b/l. Varicosities present b/l. No cyanosis or clubbing noted b/l LE.  Neurologic Normal speech. Oriented to person, place, and time. Protective sensation intact 5/5 intact bilaterally with 10g monofilament b/l. Vibratory sensation intact b/l.  Dermatologic Pedal integument with normal turgor,  texture and tone BLE. No open wounds b/l LE. No interdigital macerations noted b/l LE. Toenails 1-5 b/l elongated, discolored, dystrophic, thickened, crumbly with subungual debris and tenderness to dorsal palpation.   No hyperkeratotic nor porokeratotic lesions present on today's visit.  Orthopedic: Muscle strength 5/5 to all lower extremity muscle groups bilaterally. Tailor's bunion deformity noted b/l LE. Hammertoe deformity noted 2-5 b/l. Utilizes cane for ambulation assistance.   Radiographs: None  Assessment/Plan: 1. Pain due to onychomycosis of toenail   2. PAD (peripheral artery disease) (HCC)   3. Blood clotting disorder (HCC)       No orders of the defined types were placed in this encounter.   -Consent given for treatment as described below: -Continue supportive shoe gear daily. -Toenails 1-5 b/l were debrided in length and girth with sterile nail nippers and dremel without iatrogenic bleeding.  -Patient/POA to call should there be question/concern in the interim.   Return in about 3 months (around 03/03/2024) for rfc.  Asberry Failing, DPM

## 2023-12-09 ENCOUNTER — Telehealth: Payer: Self-pay | Admitting: Cardiology

## 2023-12-09 NOTE — Telephone Encounter (Signed)
 Patient is calling with questions about the procedure that Dr. Inocencio discuss with her. Please advise

## 2023-12-10 NOTE — Telephone Encounter (Signed)
 Spent 20 minutes on the phone with patient. Pt reports no energy, no incentive to do anything and thinks she would like to try the medication and DCCV.  Aware I will forward to Dr. Inocencio for Amiodarone  dosing instructions.   Dr. Inocencio, she will need an H&P for DCCV -- are you willing to do a telephone visit for H&P?  (Pt lives alone and already having transportation limitations)  Aware I will follow up today/tomorrow to arrange medication start & DCCV.  Patient verbalized understanding and agreeable to plan.

## 2023-12-11 MED ORDER — AMIODARONE HCL 200 MG PO TABS: ORAL_TABLET | ORAL | 2 refills | Status: DC

## 2023-12-11 NOTE — Telephone Encounter (Signed)
 Pt advised to start Amiodarone  200 mg twice daily for one month and then reduce to once daily thereafter.  Aware I will follow up next week to arrange H&P telephone call & DCCV. Patient verbalized understanding and agreeable to plan.

## 2023-12-22 NOTE — Progress Notes (Signed)
 Remote pacemaker transmission.

## 2023-12-22 NOTE — Addendum Note (Signed)
 Addended by: VICCI SELLER A on: 12/22/2023 09:40 AM   Modules accepted: Orders

## 2023-12-24 NOTE — Telephone Encounter (Signed)
 Followed up with pt, apologized for delay. Scheduled to come in to office next Tuesday for nurse visit EKG to determine if DCCV needs to be scheduled.   Patient verbalized understanding and agreeable to plan.

## 2023-12-28 DIAGNOSIS — H6123 Impacted cerumen, bilateral: Secondary | ICD-10-CM | POA: Diagnosis not present

## 2023-12-28 DIAGNOSIS — H7292 Unspecified perforation of tympanic membrane, left ear: Secondary | ICD-10-CM | POA: Diagnosis not present

## 2023-12-29 ENCOUNTER — Ambulatory Visit: Attending: Internal Medicine | Admitting: *Deleted

## 2023-12-29 VITALS — HR 70 | Ht 61.0 in

## 2023-12-29 DIAGNOSIS — I4819 Other persistent atrial fibrillation: Secondary | ICD-10-CM | POA: Diagnosis not present

## 2023-12-29 NOTE — Progress Notes (Signed)
   Nurse Visit   Date of Encounter: 12/29/2023 ID: MARSHAYLA MITSCHKE, DOB 1929/07/18, MRN [id number]  PCP:  Dwight Trula SQUIBB, MD   [facility] Providers Cardiologist:  Maude Emmer, MD Electrophysiologist:  Soyla Gladis Norton, MD      Visit Details   VS:  There were no vitals taken for this visit. , BMI There is no height or weight on file to calculate BMI.  Wt Readings from Last 3 Encounters:  11/18/23 140 lb 6.4 oz (63.7 kg)  09/03/23 142 lb (64.4 kg)  09/01/23 142 lb (64.4 kg)     Reason for visit: EKG post Amiodarone  start (determine if DCCV still needed. Performed today: Vitals, EKG, and Education Changes (medications, testing, etc.) : no changes Length of Visit: 15 minutes  Pt understands Dr. Norton will review and office will call if DCCV needs to be arranged.     Medications Adjustments/Labs and Tests Ordered: Orders Placed This Encounter  Procedures   EKG 12-Lead   No orders of the defined types were placed in this encounter.    Signed, Gretel Maeola CROME, RN  12/29/2023 2:25 PM

## 2023-12-31 DIAGNOSIS — I480 Paroxysmal atrial fibrillation: Secondary | ICD-10-CM | POA: Diagnosis not present

## 2023-12-31 DIAGNOSIS — E7800 Pure hypercholesterolemia, unspecified: Secondary | ICD-10-CM | POA: Diagnosis not present

## 2023-12-31 DIAGNOSIS — I5032 Chronic diastolic (congestive) heart failure: Secondary | ICD-10-CM | POA: Diagnosis not present

## 2023-12-31 DIAGNOSIS — E039 Hypothyroidism, unspecified: Secondary | ICD-10-CM | POA: Diagnosis not present

## 2024-01-08 ENCOUNTER — Telehealth: Payer: Self-pay | Admitting: Cardiology

## 2024-01-08 NOTE — Telephone Encounter (Signed)
 Called and spoke to pt regarding her recent EKG/Nurse Visit. She also had questions about the Amiodarone . She will have taken this med BID for 4 weeks on 01/11/2024. Advised her to begin 200 mg once daily dosing after 01/11/2024 and to continue this medication unless Dr. Inocencio advises otherwise; she verbalized understanding.   She wants to know the results of her EKG and if she needs a DCCV. Informed her that a nurse with EP would reach out to her the week of 01/11/24. No other concerns for now.

## 2024-01-08 NOTE — Telephone Encounter (Signed)
 Patient had EKG done recently and would like to know the results and if Dr. Inocencio would still like her to have a cardioversion. Please advise.

## 2024-01-19 DIAGNOSIS — H7292 Unspecified perforation of tympanic membrane, left ear: Secondary | ICD-10-CM | POA: Diagnosis not present

## 2024-01-19 NOTE — Progress Notes (Signed)
 Otolaryngology Clinic Note  HPI:    Samantha [name] is a 89 y.o. female who presents as a return patient.  [name] returns today with complaint of left ear discomfort.  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  I had given her some Ciprodex drops to use after her last visit because there was some moisture in the canal but not frank otorrhea.  She did not complain of any drainage.  She does note some pain in the posterior left neck/occipital area.  It is not constant or severe.  PMH/Meds/All/SocHx/FamHx/ROS:   Medical History[1]  Surgical History[2]  No family history of bleeding disorders, wound healing problems or difficulty with anesthesia.      Current Medications[3]  A complete ROS was performed with pertinent positives/negatives noted in the HPI. The remainder of the ROS are negative.    Physical Exam:    There were no vitals taken for this visit.  Constitutional:  Patient appears well-nourished and well-developed. No acute distress.   Head/Face: Facial features are symmetric. Skull is normocephalic. Hair and scalp are normal. Normal temporal artery pulses. TMJ shows no joint deformity swelling or erythema.   Eyes: Pupils are equal, round and reactive to light. Conjunctiva and lids are normal. Normal extraocular mobility. Normal vision by patient report.   Ears:     Right: Pinna and external meatus normal, normal ear canal skin and caliber without excessive cerumen or drainage. Tympanic membranes intact without effusion or infection.     Left: Pinna and external meatus normal, normal ear canal skin and caliber without excessive cerumen or drainage. Tympanic membrane shows perforation without effusion or infection.   Nose/Sinus/Nasopharynx: Septum is normal. Normal nasal mucosa. Normal inferior turbinates.    Oral cavity/Oropharynx: Lips normal, teeth and gums normal with good dentition, normal oral vestibule. Normal  floor of mouth, tongue and oral mucosa, no mucosal lesions, ulcer or mass, normal tongue mobility.  Hard and soft palate normal with normal mobility. Tonsils surgically absent, no erythema or exudate. Base of tongue, retromolar trigone and oral pharynx normal. Normal sensation, mobility and gag.   Neck: No cervical lymphadenopathy, mass or swelling.  Slight tenderness to palpation of the posterior left neck /occipital region Salivary glands normal to palpation without swelling, erythema or mass. Normal facial nerve function. Normal thyroid  gland palpation.   Neurological: Alert and oriented to self, place and time.  Normal reflexes and motor skills, balance and coordination.   Psychiatric: No unusual anxiety or evidence of depression. Appropriate affect.     Independent Review of Additional Tests or Records:  None  Procedures:  None   Impression & Plans:   1) left tympanic membrane perforation   Keep scheduled September appointment but may contact us  as needed for any acute problems  Alm RAMAN. Spainhour, PA-C [facility]        [1] Past Medical History: Diagnosis Date  . Arthritis   . Asthma (CMD)   . Atrial fibrillation    (CMD)   . Cervicalgia   . Hearing loss   . Hyperlipemia   . Hypothyroid   . Osteoporosis   . Tympanic membrane perforation   [2] Past Surgical History: Procedure Laterality Date  . CATARACT EXTRACTION     Procedure: CATARACT EXTRACTION  . INSERT / REPLACE / REMOVE PACEMAKER    . KNEE SURGERY     Procedure: KNEE SURGERY  . TONSILLECTOMY     Procedure: TONSILLECTOMY  . WISDOM  TOOTH EXTRACTION     Procedure: WISDOM TOOTH EXTRACTION  [3]  Current Outpatient Medications:  .  acetaminophen  (TYLENOL ) 500 mg tablet, Take  by mouth., Disp: , Rfl:  .  albuterol  HFA (Ventolin  HFA) 90 mcg/actuation inhaler, Inhale 1 puff., Disp: , Rfl:  .  apixaban  (Eliquis ) 5 mg tab, Take 5 mg by mouth., Disp: , Rfl:  .  cholecalciferol (VITAMIN D3) 2,000  unit tablet, Take 2,000 Units by mouth., Disp: , Rfl:  .  cyanocobalamin (VITAMIN B12) 100 mcg tablet, Take 100 mcg by mouth., Disp: , Rfl:  .  denosumab  (Prolia ) 60 mg/mL syrg syringe, Inject 60 mg under the skin., Disp: , Rfl:  .  furosemide  (LASIX ) 40 mg tablet, Take  by mouth., Disp: , Rfl:  .  hydrocortisone  1 % cream, Apply topically., Disp: , Rfl:  .  hydrocortisone -aloe vera (CORTISONE-10 WITh ALOE) 1 % cream, , Disp: , Rfl:  .  levothyroxine  (SYNTHROID ) 125 mcg tablet, TAKE ONE TABLET BY MOUTH BEFORE BREAKFAST, Disp: , Rfl:  .  loratadine  (CLARITIN ) 10 mg tablet, Take 10 mg by mouth., Disp: , Rfl:  .  lovastatin (MEVACOR) 20 mg tablet, , Disp: , Rfl:  .  lubricants (K-Y) gel, Place vaginally., Disp: , Rfl:  .  metoprolol  succinate (TOPROL  XL) 50 mg 24 hr tablet, , Disp: , Rfl:  .  montelukast  (SINGULAIR ) 10 mg tablet, , Disp: , Rfl:  .  mupirocin  (BACTROBAN ) 2 % ointment, Apply to nose 3 times daily, Disp: 22 g, Rfl: 1 .  mupirocin  (BACTROBAN ) 2 % ointment, Gently apply in nose 3 times daily, Disp: 22 g, Rfl: 5 .  mupirocin  (BACTROBAN ) 2 % ointment, Apply in nose 3 times daily, Disp: 22 g, Rfl: 3 .  ofloxacin (FLOXIN) 0.3 % otic solution, Administer 5 drops into left ear Once Daily., Disp: 5 mL, Rfl: 0 .  ofloxacin (FLOXIN) 0.3 % otic solution, 5 drops to affected ear twice daily, Disp: 5 mL, Rfl: 0 .  omega 3-dha-epa-fish oil 60-90-500 mg cap, Take 1 capsule by mouth., Disp: , Rfl:  .  omeprazole (PriLOSEC) 40 mg DR capsule, , Disp: , Rfl:  .  polyethylene glycol (MIRALAX ) 17 gram powd powder, Take  by mouth., Disp: , Rfl:  .  potassium chloride  (KLOR-CON ) 10 mEq ER tablet, Take  by mouth., Disp: , Rfl:  .  potassium citrate (UROCIT-K) 10 mEq (1,080 mg) CR tablet, 10 milliequivalents by oral route., Disp: , Rfl:  .  ProAir  HFA 90 mcg/actuation inhaler, , Disp: , Rfl:  .  sodium chloride  (Ayr Saline) 0.65 % drop nasal drops, Administer 1 spray into affected nostril(s)., Disp: , Rfl:   .  triamcinolone (KENALOG) 0.025 % ointment, , Disp: , Rfl:  .  vitamins A,C,E-zinc-copper (PreserVision AREDS) 2,148 mcg-113 mg-45 mg-17.4mg  tab, 1 tablet, Disp: , Rfl:

## 2024-01-22 ENCOUNTER — Ambulatory Visit (HOSPITAL_COMMUNITY)
Admission: RE | Admit: 2024-01-22 | Discharge: 2024-01-22 | Disposition: A | Discharge status: Home / Self Care | Source: Ambulatory Visit | Attending: Physician Assistant | Admitting: Physician Assistant

## 2024-01-22 VITALS — BP 118/60 | HR 70 | Ht 61.0 in | Wt 138.4 lb

## 2024-01-22 DIAGNOSIS — I4819 Other persistent atrial fibrillation: Secondary | ICD-10-CM | POA: Diagnosis not present

## 2024-01-22 DIAGNOSIS — I4891 Unspecified atrial fibrillation: Secondary | ICD-10-CM | POA: Diagnosis not present

## 2024-01-22 DIAGNOSIS — Z79899 Other long term (current) drug therapy: Secondary | ICD-10-CM | POA: Diagnosis not present

## 2024-01-22 DIAGNOSIS — Z5181 Encounter for therapeutic drug level monitoring: Secondary | ICD-10-CM | POA: Diagnosis not present

## 2024-01-22 DIAGNOSIS — D6869 Other thrombophilia: Secondary | ICD-10-CM | POA: Diagnosis not present

## 2024-01-22 NOTE — H&P (View-Only) (Signed)
 Primary Care Physician: Dwight Trula SQUIBB, MD Primary Cardiologist: Maude Emmer, MD Electrophysiologist: Will Gladis Norton, MD  Referring Physician: Dr Norton Elyn FORBES Steen is a 89 y.o. female with a history of CHB s/p PPM, HLD, atrial fibrillation who presents for follow up in the [facility].  The patient was in persistent afib at her visit with Dr Norton on 11/18/23. She was started on amiodarone . Patient is on Eliquis  for stroke prevention.    Patient presents today for follow up for atrial fibrillation and amiodarone  monitoring. She remains in rate controlled afib, now loaded on amiodarone . She continues to feel fatigued. No bleeding issues on anticoagulation.   Today, she denies symptoms of palpitations, chest pain, shortness of breath, orthopnea, PND, lower extremity edema, dizziness, presyncope, syncope, snoring, daytime somnolence, bleeding, or neurologic sequela. The patient is tolerating medications without difficulties and is otherwise without complaint today.    Atrial Fibrillation Risk Factors:  she does not have symptoms or diagnosis of sleep apnea. she does not have a history of rheumatic fever.   Atrial Fibrillation Management history:  Previous antiarrhythmic drugs: amiodarone   Previous cardioversions: 11/27/15 Previous ablations: none Anticoagulation history: Eliquis   ROS- All systems are reviewed and negative except as per the HPI above.  Past Medical History:  Diagnosis Date   Arthritis    Asthma    GERD (gastroesophageal reflux disease)    High cholesterol    Hypothyroidism    LBBB (left bundle branch block)    Ventricular ectopy     Current Outpatient Medications  Medication Sig Dispense Refill   albuterol  (VENTOLIN  HFA) 108 (90 Base) MCG/ACT inhaler Inhale 1-2 puffs into the lungs every 6 (six) hours as needed for wheezing or shortness of breath. 18 g 0   amiodarone  (PACERONE ) 200 MG tablet Take as directed (Patient  taking differently: Take 200 mg by mouth daily. Take as directed) 90 tablet 2   apixaban  (ELIQUIS ) 5 MG TABS tablet Take 1 tablet (5 mg total) by mouth in the morning and at bedtime. 60 tablet    Calcium-Magnesium -Zinc (CALCIUM-MAGNESUIUM-ZINC PO) Take 1 tablet by mouth every Monday, Wednesday, and Friday.     Cholecalciferol (VITAMIN D3) 50 MCG (2000 UT) CAPS Take 2,000 Units by mouth daily.     cyanocobalamin (VITAMIN B12) 1000 MCG tablet Take 1,000 mcg by mouth every Monday, Wednesday, and Friday.     denosumab  (PROLIA ) 60 MG/ML SOSY injection Inject 60 mg into the skin every 6 (six) months.     furosemide  (LASIX ) 20 MG tablet TAKE 1 TABLET BY MOUTH ONCE DAILY 30 tablet 3   KLOR-CON  M10 10 MEQ tablet Take 10 mEq by mouth daily.     levothyroxine  (SYNTHROID ) 125 MCG tablet Take 125 mcg by mouth daily before breakfast.     loratadine  (CLARITIN ) 10 MG tablet Take 10 mg by mouth daily.     lovastatin (MEVACOR) 20 MG tablet Take 20 mg by mouth at bedtime.     Lubricants (LUBRICATING JELLY) GEL Place 1 application  vaginally daily.     metoprolol  succinate (TOPROL -XL) 50 MG 24 hr tablet Take 50 mg by mouth at bedtime.     montelukast  (SINGULAIR ) 10 MG tablet Take 10 mg by mouth at bedtime.     Multiple Vitamins-Minerals ([employer] ADULT 50+ EYE HEALTH) CAPS Take 1 capsule by mouth daily.     mupirocin  ointment (BACTROBAN ) 2 % Apply 1 application. topically 2 (two) times daily. Apply to area on  foot. 15 g 0   Omega-3 Fatty Acids (FISH OIL) 1000 MG CAPS Take 1,000 mg by mouth daily after breakfast.     omeprazole (PRILOSEC) 40 MG capsule Take 40 mg by mouth at bedtime.     polyethylene glycol (MIRALAX  / GLYCOLAX ) packet Take 8.5 g by mouth every other day.     potassium chloride  (K-DUR) 10 MEQ tablet Take 1 tablet (10 mEq total) by mouth daily. 90 tablet 3   sodium chloride  (OCEAN) 0.65 % SOLN nasal spray Place 1 spray into both nostrils 3 (three) times daily as needed (nasal dryness).     No current  facility-administered medications for this encounter.    Physical Exam: BP 118/60   Pulse 70   Ht 5' 1 (1.549 m)   Wt 62.8 kg Comment: Patient weighed at home this morning  BMI 26.15 kg/m   GEN: Well nourished, well developed in no acute distress NECK: No JVD; No carotid bruits CARDIAC: Irregularly irregular rate and rhythm, no murmurs, rubs, gallops RESPIRATORY:  Clear to auscultation without rales, wheezing or rhonchi  ABDOMEN: Soft, non-tender, non-distended EXTREMITIES:  No edema; No deformity   Wt Readings from Last 3 Encounters:  01/22/24 62.8 kg  11/18/23 63.7 kg  09/03/23 64.4 kg     EKG today demonstrates  V pacing with underlying afib  Vent. rate 70 BPM PR interval * ms QRS duration 140 ms QT/QTcB 466/503 ms   Echo 07/24/22 demonstrated   1. Left ventricular ejection fraction, by estimation, is 50 to 55%. The  left ventricle has low normal function. The left ventricle has no regional  wall motion abnormalities. Left ventricular diastolic parameters were  normal.   2. Right ventricular systolic function is normal. The right ventricular  size is normal. Tricuspid regurgitation signal is inadequate for assessing  PA pressure.   3. The mitral valve is normal in structure. Moderate mitral valve  regurgitation. No evidence of mitral stenosis.   4. The aortic valve is tricuspid. Aortic valve regurgitation is not  visualized. Aortic valve sclerosis is present, with no evidence of aortic  valve stenosis.   5. The inferior vena cava is normal in size with greater than 50%  respiratory variability, suggesting right atrial pressure of 3 mmHg.    CHA2DS2-VASc Score = 3  The patient's score is based upon: CHF History: 0 HTN History: 0 Diabetes History: 0 Stroke History: 0 Vascular Disease History: 0 Age Score: 2 Gender Score: 1       ASSESSMENT AND PLAN: Persistent Atrial Fibrillation (ICD10:  I48.19) The patient's CHA2DS2-VASc score is 3, indicating a 3.2%  annual risk of stroke.   Patient remains in persistent afib. We discussed rhythm control options today. Will plan for DCCV now that she has loaded on amiodarone . Check bmet/cbc. Continue amiodarone  200 mg daily Continue Eliquis  5 mg BID Continue Toprol  50 mg daily  Secondary Hypercoagulable State (ICD10:  D68.69) The patient is at significant risk for stroke/thromboembolism based upon her CHA2DS2-VASc Score of 3.  Continue Apixaban  (Eliquis ). No bleeding issues.   High Risk Medication Monitoring (ICD 10: J342684) Patient requires ongoing monitoring for anti-arrhythmic medication which has the potential to cause life threatening arrhythmias. Intervals on ECG acceptable for amiodarone  monitoring.   CHB S/p PPM, followed by Dr Inocencio    Follow up in the AF clinic post DCCV.   Informed Consent   Shared Decision Making/Informed Consent The risks (stroke, cardiac arrhythmias rarely resulting in the need for a temporary or permanent pacemaker,  skin irritation or burns and complications associated with conscious sedation including aspiration, arrhythmia, respiratory failure and death), benefits (restoration of normal sinus rhythm) and alternatives of a direct current cardioversion were explained in detail to Ms. Sleight and she agrees to proceed.         [facility] [address] [place], [place] [address] [phone]

## 2024-01-22 NOTE — Patient Instructions (Addendum)
 Call Samantha [name] with dates for cardioversion -- [phone] (option 2)   Cardioversion scheduled for:   - Arrive at the [facility] ([address])  and check in with ADMITTING at   - Do not eat or drink anything after midnight the night prior to your procedure.   - Take all your morning medication (except diabetic medications) with a sip of water prior to arrival.  - Do NOT miss any doses of your blood thinner - if you should miss a dose or take a dose more than 4 hours late -- please notify our office immediately.  - You will not be able to drive home after your procedure. Please ensure you have a responsible adult to drive you home. You will need someone with you for 24 hours post procedure.     - Expect to be in the procedural area approximately 2 hours.   - If you feel as if you go back into normal rhythm prior to scheduled cardioversion, please notify our office immediately.   If your procedure is canceled in the cardioversion suite you will be charged a cancellation fee.

## 2024-01-22 NOTE — Progress Notes (Signed)
 Primary Care Physician: Samantha Trula SQUIBB, MD Primary Cardiologist: Samantha Emmer, MD Electrophysiologist: Samantha Gladis Norton, MD  Referring Physician: Dr Samantha [name] Samantha is a 89 y.o. female with a history of CHB s/p PPM, HLD, atrial fibrillation who presents for follow up in the [facility].  The patient was in persistent afib at her visit with Dr [name] on 11/18/23. She was started on amiodarone . Patient is on Eliquis  for stroke prevention.    Patient presents today for follow up for atrial fibrillation and amiodarone  monitoring. She remains in rate controlled afib, now loaded on amiodarone . She continues to feel fatigued. No bleeding issues on anticoagulation.   Today, she denies symptoms of palpitations, chest pain, shortness of breath, orthopnea, PND, lower extremity edema, dizziness, presyncope, syncope, snoring, daytime somnolence, bleeding, or neurologic sequela. The patient is tolerating medications without difficulties and is otherwise without complaint today.    Atrial Fibrillation Risk Factors:  she does not have symptoms or diagnosis of sleep apnea. she does not have a history of rheumatic fever.   Atrial Fibrillation Management history:  Previous antiarrhythmic drugs: amiodarone   Previous cardioversions: 11/27/15 Previous ablations: none Anticoagulation history: Eliquis   ROS- All systems are reviewed and negative except as per the HPI above.  Past Medical History:  Diagnosis Date   Arthritis    Asthma    GERD (gastroesophageal reflux disease)    High cholesterol    Hypothyroidism    LBBB (left bundle branch block)    Ventricular ectopy     Current Outpatient Medications  Medication Sig Dispense Refill   albuterol  (VENTOLIN  HFA) 108 (90 Base) MCG/ACT inhaler Inhale 1-2 puffs into the lungs every 6 (six) hours as needed for wheezing or shortness of breath. 18 g 0   amiodarone  (PACERONE ) 200 MG tablet Take as directed (Patient  taking differently: Take 200 mg by mouth daily. Take as directed) 90 tablet 2   apixaban  (ELIQUIS ) 5 MG TABS tablet Take 1 tablet (5 mg total) by mouth in the morning and at bedtime. 60 tablet    Calcium-Magnesium -Zinc (CALCIUM-MAGNESUIUM-ZINC PO) Take 1 tablet by mouth every Monday, Wednesday, and Friday.     Cholecalciferol (VITAMIN D3) 50 MCG (2000 UT) CAPS Take 2,000 Units by mouth daily.     cyanocobalamin (VITAMIN B12) 1000 MCG tablet Take 1,000 mcg by mouth every Monday, Wednesday, and Friday.     denosumab  (PROLIA ) 60 MG/ML SOSY injection Inject 60 mg into the skin every 6 (six) months.     furosemide  (LASIX ) 20 MG tablet TAKE 1 TABLET BY MOUTH ONCE DAILY 30 tablet 3   KLOR-CON  M10 10 MEQ tablet Take 10 mEq by mouth daily.     levothyroxine  (SYNTHROID ) 125 MCG tablet Take 125 mcg by mouth daily before breakfast.     loratadine  (CLARITIN ) 10 MG tablet Take 10 mg by mouth daily.     lovastatin (MEVACOR) 20 MG tablet Take 20 mg by mouth at bedtime.     Lubricants (LUBRICATING JELLY) GEL Place 1 application  vaginally daily.     metoprolol  succinate (TOPROL -XL) 50 MG 24 hr tablet Take 50 mg by mouth at bedtime.     montelukast  (SINGULAIR ) 10 MG tablet Take 10 mg by mouth at bedtime.     Multiple Vitamins-Minerals ([employer] ADULT 50+ EYE HEALTH) CAPS Take 1 capsule by mouth daily.     mupirocin  ointment (BACTROBAN ) 2 % Apply 1 application. topically 2 (two) times daily. Apply to area on  foot. 15 g 0   Omega-3 Fatty Acids (FISH OIL) 1000 MG CAPS Take 1,000 mg by mouth daily after breakfast.     omeprazole (PRILOSEC) 40 MG capsule Take 40 mg by mouth at bedtime.     polyethylene glycol (MIRALAX  / GLYCOLAX ) packet Take 8.5 g by mouth every other day.     potassium chloride  (K-DUR) 10 MEQ tablet Take 1 tablet (10 mEq total) by mouth daily. 90 tablet 3   sodium chloride  (OCEAN) 0.65 % SOLN nasal spray Place 1 spray into both nostrils 3 (three) times daily as needed (nasal dryness).     No current  facility-administered medications for this encounter.    Physical Exam: BP 118/60   Pulse 70   Ht 5' 1 (1.549 m)   Wt 62.8 kg Comment: Patient weighed at home this morning  BMI 26.15 kg/m   GEN: Well nourished, well developed in no acute distress NECK: No JVD; No carotid bruits CARDIAC: Irregularly irregular rate and rhythm, no murmurs, rubs, gallops RESPIRATORY:  Clear to auscultation without rales, wheezing or rhonchi  ABDOMEN: Soft, non-tender, non-distended EXTREMITIES:  No edema; No deformity   Wt Readings from Last 3 Encounters:  01/22/24 62.8 kg  11/18/23 63.7 kg  09/03/23 64.4 kg     EKG today demonstrates  V pacing with underlying afib  Vent. rate 70 BPM PR interval * ms QRS duration 140 ms QT/QTcB 466/503 ms   Echo 07/24/22 demonstrated   1. Left ventricular ejection fraction, by estimation, is 50 to 55%. The  left ventricle has low normal function. The left ventricle has no regional  wall motion abnormalities. Left ventricular diastolic parameters were  normal.   2. Right ventricular systolic function is normal. The right ventricular  size is normal. Tricuspid regurgitation signal is inadequate for assessing  PA pressure.   3. The mitral valve is normal in structure. Moderate mitral valve  regurgitation. No evidence of mitral stenosis.   4. The aortic valve is tricuspid. Aortic valve regurgitation is not  visualized. Aortic valve sclerosis is present, with no evidence of aortic  valve stenosis.   5. The inferior vena cava is normal in size with greater than 50%  respiratory variability, suggesting right atrial pressure of 3 mmHg.    CHA2DS2-VASc Score = 3  The patient's score is based upon: CHF History: 0 HTN History: 0 Diabetes History: 0 Stroke History: 0 Vascular Disease History: 0 Age Score: 2 Gender Score: 1       ASSESSMENT AND PLAN: Persistent Atrial Fibrillation (ICD10:  I48.19) The patient's CHA2DS2-VASc score is 3, indicating a 3.2%  annual risk of stroke.   Patient remains in persistent afib. We discussed rhythm control options today. Samantha plan for DCCV now that she has loaded on amiodarone . Check bmet/cbc. Continue amiodarone  200 mg daily Continue Eliquis  5 mg BID Continue Toprol  50 mg daily  Secondary Hypercoagulable State (ICD10:  D68.69) The patient is at significant risk for stroke/thromboembolism based upon her CHA2DS2-VASc Score of 3.  Continue Apixaban  (Eliquis ). No bleeding issues.   High Risk Medication Monitoring (ICD 10: J342684) Patient requires ongoing monitoring for anti-arrhythmic medication which has the potential to cause life threatening arrhythmias. Intervals on ECG acceptable for amiodarone  monitoring.   CHB S/p PPM, followed by Dr Inocencio    Follow up in the AF clinic post DCCV.   Informed Consent   Shared Decision Making/Informed Consent The risks (stroke, cardiac arrhythmias rarely resulting in the need for a temporary or permanent pacemaker,  skin irritation or burns and complications associated with conscious sedation including aspiration, arrhythmia, respiratory failure and death), benefits (restoration of normal sinus rhythm) and alternatives of a direct current cardioversion were explained in detail to Ms. Sleight and she agrees to proceed.         [facility] [address] [place], [place] [address] [phone]

## 2024-01-23 LAB — CBC
Hematocrit: 36.9 % (ref 34.0–46.6)
Hemoglobin: 11.6 g/dL (ref 11.1–15.9)
MCH: 29.7 pg (ref 26.6–33.0)
MCHC: 31.4 g/dL — ABNORMAL LOW (ref 31.5–35.7)
MCV: 95 fL (ref 79–97)
Platelets: 217 x10E3/uL (ref 150–450)
RBC: 3.9 x10E6/uL (ref 3.77–5.28)
RDW: 14.2 % (ref 11.7–15.4)
WBC: 9.9 x10E3/uL (ref 3.4–10.8)

## 2024-01-23 LAB — BASIC METABOLIC PANEL WITH GFR
BUN/Creatinine Ratio: 17 (ref 12–28)
BUN: 20 mg/dL (ref 10–36)
CO2: 22 mmol/L (ref 20–29)
Calcium: 9.1 mg/dL (ref 8.7–10.3)
Chloride: 102 mmol/L (ref 96–106)
Creatinine, Ser: 1.16 mg/dL — ABNORMAL HIGH (ref 0.57–1.00)
Glucose: 80 mg/dL (ref 70–99)
Potassium: 4.7 mmol/L (ref 3.5–5.2)
Sodium: 139 mmol/L (ref 134–144)
eGFR: 44 mL/min/1.73 — ABNORMAL LOW (ref >59)

## 2024-01-25 ENCOUNTER — Ambulatory Visit (HOSPITAL_COMMUNITY): Payer: Self-pay | Admitting: Physician Assistant

## 2024-01-25 ENCOUNTER — Other Ambulatory Visit (HOSPITAL_COMMUNITY): Payer: Self-pay | Admitting: *Deleted

## 2024-01-25 DIAGNOSIS — I4819 Other persistent atrial fibrillation: Secondary | ICD-10-CM

## 2024-01-28 ENCOUNTER — Other Ambulatory Visit: Payer: Self-pay

## 2024-01-28 ENCOUNTER — Ambulatory Visit (HOSPITAL_COMMUNITY)
Admission: RE | Admit: 2024-01-28 | Discharge: 2024-01-28 | Disposition: A | Discharge status: Home / Self Care | Attending: Cardiovascular Disease | Admitting: Cardiovascular Disease

## 2024-01-28 ENCOUNTER — Encounter (HOSPITAL_COMMUNITY): Payer: Self-pay | Admitting: Cardiovascular Disease

## 2024-01-28 ENCOUNTER — Ambulatory Visit (HOSPITAL_COMMUNITY)

## 2024-01-28 ENCOUNTER — Encounter (HOSPITAL_COMMUNITY)
Admission: RE | Disposition: A | Discharge status: Home / Self Care | Payer: Self-pay | Source: Home / Self Care | Attending: Cardiovascular Disease

## 2024-01-28 DIAGNOSIS — Z7901 Long term (current) use of anticoagulants: Secondary | ICD-10-CM | POA: Insufficient documentation

## 2024-01-28 DIAGNOSIS — Z79899 Other long term (current) drug therapy: Secondary | ICD-10-CM | POA: Insufficient documentation

## 2024-01-28 DIAGNOSIS — Z950 Presence of cardiac pacemaker: Secondary | ICD-10-CM | POA: Insufficient documentation

## 2024-01-28 DIAGNOSIS — E039 Hypothyroidism, unspecified: Secondary | ICD-10-CM | POA: Insufficient documentation

## 2024-01-28 DIAGNOSIS — J45909 Unspecified asthma, uncomplicated: Secondary | ICD-10-CM | POA: Diagnosis not present

## 2024-01-28 DIAGNOSIS — E785 Hyperlipidemia, unspecified: Secondary | ICD-10-CM

## 2024-01-28 DIAGNOSIS — Z79890 Hormone replacement therapy (postmenopausal): Secondary | ICD-10-CM | POA: Insufficient documentation

## 2024-01-28 DIAGNOSIS — I10 Essential (primary) hypertension: Secondary | ICD-10-CM | POA: Insufficient documentation

## 2024-01-28 DIAGNOSIS — I4819 Other persistent atrial fibrillation: Secondary | ICD-10-CM | POA: Insufficient documentation

## 2024-01-28 DIAGNOSIS — I442 Atrioventricular block, complete: Secondary | ICD-10-CM | POA: Diagnosis not present

## 2024-01-28 DIAGNOSIS — I4891 Unspecified atrial fibrillation: Secondary | ICD-10-CM

## 2024-01-28 DIAGNOSIS — D6869 Other thrombophilia: Secondary | ICD-10-CM | POA: Diagnosis not present

## 2024-01-28 HISTORY — PX: CARDIOVERSION: EP1203

## 2024-01-28 SURGERY — CARDIOVERSION (CATH LAB)
Anesthesia: General

## 2024-01-28 MED ORDER — LIDOCAINE 2% (20 MG/ML) 5 ML SYRINGE
INTRAMUSCULAR | Status: DC | PRN
  Administered 2024-01-28: 60 mg via INTRAVENOUS

## 2024-01-28 MED ORDER — PROPOFOL 10 MG/ML IV BOLUS
INTRAVENOUS | Status: DC | PRN
Start: 2024-01-28 — End: 2024-01-28
  Administered 2024-01-28: 50 mg via INTRAVENOUS

## 2024-01-28 MED ORDER — SODIUM CHLORIDE 0.9% FLUSH
INTRAVENOUS | Status: DC | PRN
  Administered 2024-01-28: 3 mL via INTRAVENOUS

## 2024-01-28 SURGICAL SUPPLY — 1 items: PAD DEFIB RADIO PHYSIO CONN (PAD) ×1 IMPLANT

## 2024-01-28 NOTE — Anesthesia Postprocedure Evaluation (Signed)
 Anesthesia Post Note  Patient: Samantha [name]  Procedure(s) Performed: CARDIOVERSION     Patient location during evaluation: Cath Lab Anesthesia Type: General Level of consciousness: awake and alert Pain management: pain level controlled Vital Signs Assessment: post-procedure vital signs reviewed and stable Respiratory status: spontaneous breathing, nonlabored ventilation and respiratory function stable Cardiovascular status: blood pressure returned to baseline and stable Postop Assessment: no apparent nausea or vomiting Anesthetic complications: no   No notable events documented.  Last Vitals:  Vitals:   01/28/24 1301 01/28/24 1311  BP: 121/67 132/68  Pulse: 70 70  Resp: 14 18  Temp:    SpO2: 96% 96%    Last Pain:  Vitals:   01/28/24 1311  TempSrc:   PainSc: 0-No pain                 Garnette FORBES Skillern

## 2024-01-28 NOTE — CV Procedure (Signed)
 Electrical Cardioversion Procedure Note Samantha [name] [id number] [birth year]  Procedure: Electrical Cardioversion Indications:  Atrial Fibrillation  Procedure Details Consent: Risks of procedure as well as the alternatives and risks of each were explained to the (patient/caregiver).  Consent for procedure obtained. Time Out: Verified patient identification, verified procedure, site/side was marked, verified correct patient position, special equipment/implants available, medications/allergies/relevent history reviewed, required imaging and test results available.  Performed  Patient placed on cardiac monitor, pulse oximetry, supplemental oxygen  as necessary.  Sedation given: propofol  Pacer pads placed anterior and posterior chest.  Cardioverted 2 time(s).  Cardioverted at 200J successful.  However she quickly went into atrial flutter.  She was then successfully cardioverted again at 200J.  Evaluation Findings: Post procedure EKG shows: ASVP Complications: None Patient did tolerate procedure well.   [name], MD 01/28/2024, 12:47 PM

## 2024-01-28 NOTE — Interval H&P Note (Signed)
 History and Physical Interval Note:  01/28/2024 12:34 PM  Samantha [name]  has presented today for surgery, with the diagnosis of afib.  The various methods of treatment have been discussed with the patient and family. After consideration of risks, benefits and other options for treatment, the patient has consented to  Procedure(s): CARDIOVERSION (N/A) as a surgical intervention.  The patient's history has been reviewed, patient examined, no change in status, stable for surgery.  I have reviewed the patient's chart and labs.  Questions were answered to the patient's satisfaction.     [name], MD

## 2024-01-28 NOTE — Transfer of Care (Signed)
 Immediate Anesthesia Transfer of Care Note  Patient: Samantha [name]  Procedure(s) Performed: CARDIOVERSION  Patient Location: Cath Lab  Anesthesia Type:General  Level of Consciousness: awake, alert , oriented, and patient cooperative  Airway & Oxygen  Therapy: Patient Spontanous Breathing and Patient connected to nasal cannula oxygen   Post-op Assessment: Report given to RN and Post -op Vital signs reviewed and stable  Post vital signs: Reviewed and stable  Last Vitals:  Vitals Value Taken Time  BP 101/52 12:48  Temp    Pulse 70   Resp 16   SpO2 96     Last Pain:  Vitals:   01/28/24 1133  PainSc: 0-No pain         Complications: No notable events documented.

## 2024-01-28 NOTE — Anesthesia Preprocedure Evaluation (Signed)
 Anesthesia Evaluation  Patient identified by MRN, date of birth, ID band Patient awake    Reviewed: Allergy & Precautions, NPO status , Patient's Chart, lab work & pertinent test results, reviewed documented beta blocker date and time   Airway Mallampati: II  TM Distance: >3 FB Neck ROM: Full    Dental  (+) Teeth Intact, Dental Advisory Given   Pulmonary asthma    Pulmonary exam normal breath sounds clear to auscultation       Cardiovascular hypertension, Pt. on home beta blockers + dysrhythmias (LBBB) + pacemaker  Rhythm:Irregular Rate:Abnormal     Neuro/Psych  PSYCHIATRIC DISORDERS Anxiety     negative neurological ROS     GI/Hepatic Neg liver ROS,GERD  ,,  Endo/Other  Hypothyroidism    Renal/GU negative Renal ROS     Musculoskeletal  (+) Arthritis ,    Abdominal   Peds  Hematology  (+) Blood dyscrasia (Eliquis )   Anesthesia Other Findings Day of surgery medications reviewed with the patient.  Reproductive/Obstetrics                              Anesthesia Physical Anesthesia Plan  ASA: 3  Anesthesia Plan: General   Post-op Pain Management: Minimal or no pain anticipated   Induction: Intravenous  PONV Risk Score and Plan: 3  Airway Management Planned: Mask  Additional Equipment:   Intra-op Plan:   Post-operative Plan: Extubation in OR  Informed Consent: I have reviewed the patients History and Physical, chart, labs and discussed the procedure including the risks, benefits and alternatives for the proposed anesthesia with the patient or authorized representative who has indicated his/her understanding and acceptance.     Dental advisory given  Plan Discussed with: CRNA  Anesthesia Plan Comments:         Anesthesia Quick Evaluation

## 2024-01-28 NOTE — Discharge Instructions (Signed)

## 2024-01-31 DIAGNOSIS — I480 Paroxysmal atrial fibrillation: Secondary | ICD-10-CM | POA: Diagnosis not present

## 2024-01-31 DIAGNOSIS — E7800 Pure hypercholesterolemia, unspecified: Secondary | ICD-10-CM | POA: Diagnosis not present

## 2024-01-31 DIAGNOSIS — I5032 Chronic diastolic (congestive) heart failure: Secondary | ICD-10-CM | POA: Diagnosis not present

## 2024-01-31 DIAGNOSIS — E039 Hypothyroidism, unspecified: Secondary | ICD-10-CM | POA: Diagnosis not present

## 2024-02-05 ENCOUNTER — Ambulatory Visit: Payer: Medicare HMO

## 2024-02-05 DIAGNOSIS — I4819 Other persistent atrial fibrillation: Secondary | ICD-10-CM

## 2024-02-05 DIAGNOSIS — L309 Dermatitis, unspecified: Secondary | ICD-10-CM | POA: Diagnosis not present

## 2024-02-06 LAB — CUP PACEART REMOTE DEVICE CHECK
Battery Remaining Longevity: 75 mo
Battery Remaining Percentage: 87 %
Battery Voltage: 2.98 V
Brady Statistic AP VP Percent: 94 %
Brady Statistic AP VS Percent: 2.7 %
Brady Statistic AS VP Percent: 3.2 %
Brady Statistic AS VS Percent: 1 %
Brady Statistic RA Percent Paced: 97 %
Brady Statistic RV Percent Paced: 97 %
Date Time Interrogation Session: 20250905020015
Implantable Lead Connection Status: 753985
Implantable Lead Connection Status: 753985
Implantable Lead Implant Date: 20240307
Implantable Lead Implant Date: 20240307
Implantable Lead Location: 753859
Implantable Lead Location: 753860
Implantable Pulse Generator Implant Date: 20240307
Lead Channel Impedance Value: 380 Ohm
Lead Channel Impedance Value: 550 Ohm
Lead Channel Pacing Threshold Amplitude: 0.5 V
Lead Channel Pacing Threshold Amplitude: 0.75 V
Lead Channel Pacing Threshold Pulse Width: 0.5 ms
Lead Channel Pacing Threshold Pulse Width: 0.5 ms
Lead Channel Sensing Intrinsic Amplitude: 2.6 mV
Lead Channel Sensing Intrinsic Amplitude: 6.7 mV
Lead Channel Setting Pacing Amplitude: 2.5 V
Lead Channel Setting Pacing Amplitude: 2.5 V
Lead Channel Setting Pacing Pulse Width: 0.5 ms
Lead Channel Setting Sensing Sensitivity: 2 mV
Pulse Gen Model: 2272
Pulse Gen Serial Number: 8163768

## 2024-02-07 ENCOUNTER — Ambulatory Visit: Payer: Self-pay | Admitting: Cardiology

## 2024-02-09 ENCOUNTER — Encounter: Payer: Self-pay | Admitting: Podiatry

## 2024-02-09 ENCOUNTER — Other Ambulatory Visit (HOSPITAL_COMMUNITY): Payer: Self-pay | Admitting: *Deleted

## 2024-02-09 ENCOUNTER — Ambulatory Visit: Admitting: Podiatry

## 2024-02-09 DIAGNOSIS — D689 Coagulation defect, unspecified: Secondary | ICD-10-CM | POA: Diagnosis not present

## 2024-02-09 DIAGNOSIS — M79676 Pain in unspecified toe(s): Secondary | ICD-10-CM

## 2024-02-09 DIAGNOSIS — I739 Peripheral vascular disease, unspecified: Secondary | ICD-10-CM | POA: Diagnosis not present

## 2024-02-09 DIAGNOSIS — B351 Tinea unguium: Secondary | ICD-10-CM

## 2024-02-09 MED ORDER — AMIODARONE HCL 200 MG PO TABS: ORAL_TABLET | ORAL | 2 refills | Status: DC

## 2024-02-10 ENCOUNTER — Other Ambulatory Visit (HOSPITAL_COMMUNITY): Payer: Self-pay | Admitting: *Deleted

## 2024-02-10 MED ORDER — AMIODARONE HCL 200 MG PO TABS: 200.0000 mg | ORAL_TABLET | Freq: Every day | ORAL | 2 refills | Status: DC

## 2024-02-13 NOTE — Progress Notes (Signed)
 Remote PPM Transmission

## 2024-02-14 NOTE — Progress Notes (Signed)
  Subjective:  Patient ID: Samantha [name], female    DOB: 1930/03/31,  MRN: [id number]  Samantha [name] presents to clinic today for at risk foot care. Patient has h/o PAD and painful mycotic toenails of both feet that are difficult to trim. Pain interferes with daily activities and wearing enclosed shoe gear comfortably.  Chief Complaint  Patient presents with   Nail Problem    Thick painful toenails,  9 week follow up    New problem(s): None.   PCP is Dwight Trula SQUIBB, MD. Samantha [name] 12/02/2023.  Allergies  Allergen Reactions   Erythromycin Diarrhea and Rash   Vibra -Tab [Doxycycline ] Hives   Benzalkonium Chloride Rash   Erythromycin Rash   Fosamax [Alendronate] Nausea Only and Other (See Comments)    Severe stomach cramps   Neo-Bacit-Poly-Lidocaine  Rash   Neomy-Bacit-Polymyx-Pramoxine Rash   Neosporin [Neomycin-Bacitracin Zn-Polymyx] Rash   Penicillins Rash    DID THE REACTION INVOLVE: Swelling of the face/tongue/throat, SOB, or low BP? N Sudden or severe rash/hives, skin peeling, or the inside of the mouth or nose? Y Did it require medical treatment? N When did it last happen?      Childhood If all above answers are NO, may proceed with cephalosporin use.   Zithromax [Azithromycin] Diarrhea    Review of Systems: Negative except as noted in the HPI.  Objective: No changes noted in today's physical examination. There were no vitals filed for this visit. Samantha [name] is a pleasant 89 y.o. female in NAD. AAO x 3.  Vascular Examination: Capillary refill time to digits immediate b/l. Palpable DP pulse(s) b/l LE. Diminished PT pulse(s) b/l LE. Pedal hair sparse. No pain with calf compression b/l. Edema left ankle. Varicosities present b/l. No cyanosis or clubbing noted b/l LE.Samantha [name]  Dermatological Examination: Pedal skin with normal turgor, texture and tone b/l. No open wounds. No interdigital macerations b/l. Toenails 1-5 b/l thickened, discolored, dystrophic with subungual debris. There  is pain on palpation to dorsal aspect of nailplates. Minimal hyperkeratos(is/es) noted R 3rd toe.  Neurological Examination: Protective sensation intact with 10 gram monofilament b/l LE. Vibratory sensation intact b/l LE.   Musculoskeletal Examination: Muscle strength 5/5 to all lower extremity muscle groups bilaterally. Tailor's bunion deformity noted b/l LE. Hammertoe deformity noted 2-5 b/l. Utilizes cane for ambulation assistance.  Assessment/Plan: 1. Pain due to onychomycosis of toenail   2. PAD (peripheral artery disease) (HCC)   Patient was evaluated and treated. All patient's and/or POA's questions/concerns addressed on today's visit. Mycotic toenails 1-5 debrided in length and girth without incident. Continue soft, supportive shoe gear daily. Report any pedal injuries to medical professional. Call office if there are any quesitons/concerns. -Patient/POA to call should there be question/concern in the interim.   Return in about 3 months (around 05/10/2024).  Delon LITTIE Merlin, DPM      [place] LOCATION: [address] [place] [address]                   Office [phone]   [facility] LOCATION: [address] [place], [place] [address] Office [phone]

## 2024-02-17 ENCOUNTER — Ambulatory Visit (HOSPITAL_COMMUNITY)
Admission: RE | Admit: 2024-02-17 | Discharge: 2024-02-17 | Disposition: A | Discharge status: Home / Self Care | Source: Ambulatory Visit | Attending: Physician Assistant | Admitting: Physician Assistant

## 2024-02-17 ENCOUNTER — Encounter (HOSPITAL_COMMUNITY): Payer: Self-pay | Admitting: Physician Assistant

## 2024-02-17 VITALS — BP 124/70 | HR 75 | Ht 61.0 in | Wt 134.8 lb

## 2024-02-17 DIAGNOSIS — D6869 Other thrombophilia: Secondary | ICD-10-CM

## 2024-02-17 DIAGNOSIS — Z5181 Encounter for therapeutic drug level monitoring: Secondary | ICD-10-CM

## 2024-02-17 DIAGNOSIS — I4819 Other persistent atrial fibrillation: Secondary | ICD-10-CM

## 2024-02-17 DIAGNOSIS — Z79899 Other long term (current) drug therapy: Secondary | ICD-10-CM | POA: Diagnosis not present

## 2024-02-17 NOTE — Progress Notes (Signed)
 Primary Care Physician: Dwight Trula SQUIBB, MD Primary Cardiologist: Maude Emmer, MD Electrophysiologist: Will Gladis Norton, MD  Referring Physician: Dr Norton Elyn FORBES Samantha [name] is a 89 y.o. female with a history of CHB s/p PPM, HLD, atrial fibrillation who presents for follow up in the [facility].  The patient was in persistent afib at her visit with Dr Norton on 11/18/23. She was started on amiodarone . Patient is on Eliquis  for stroke prevention. She is s/p DCCV on 01/28/24.   Patient returns for follow up for atrial fibrillation and amiodarone  monitoring. She states that she feels so much better in SR. She has been able to sweep and mop her floors without resting. She is very grateful for her care. No bleeding issues on anticoagulation.   Today, she  denies symptoms of palpitations, chest pain, shortness of breath, orthopnea, PND, lower extremity edema, dizziness, presyncope, syncope, snoring, daytime somnolence, bleeding, or neurologic sequela. The patient is tolerating medications without difficulties and is otherwise without complaint today.    Atrial Fibrillation Risk Factors:  she does not have symptoms or diagnosis of sleep apnea. she does not have a history of rheumatic fever.   Atrial Fibrillation Management history:  Previous antiarrhythmic drugs: amiodarone   Previous cardioversions: 11/27/15, 01/28/24 Previous ablations: none Anticoagulation history: Eliquis   ROS- All systems are reviewed and negative except as per the HPI above.  Past Medical History:  Diagnosis Date   Arthritis    Asthma    GERD (gastroesophageal reflux disease)    High cholesterol    Hypothyroidism    LBBB (left bundle branch block)    Ventricular ectopy     Current Outpatient Medications  Medication Sig Dispense Refill   albuterol  (VENTOLIN  HFA) 108 (90 Base) MCG/ACT inhaler Inhale 1-2 puffs into the lungs every 6 (six) hours as needed for wheezing or shortness  of breath. 18 g 0   amiodarone  (PACERONE ) 200 MG tablet Take 1 tablet (200 mg total) by mouth daily. Take as directed 90 tablet 2   apixaban  (ELIQUIS ) 5 MG TABS tablet Take 1 tablet (5 mg total) by mouth in the morning and at bedtime. 60 tablet    Calcium-Magnesium -Zinc (CALCIUM-MAGNESUIUM-ZINC PO) Take 1 tablet by mouth every Monday, Wednesday, and Friday. 1000 mg /400 mg /15 mg     Cholecalciferol (VITAMIN D3) 50 MCG (2000 UT) CAPS Take 2,000 Units by mouth in the morning.     cyanocobalamin (VITAMIN B12) 1000 MCG tablet Take 1,000 mcg by mouth every Monday, Wednesday, and Friday.     denosumab  (PROLIA ) 60 MG/ML SOSY injection Inject 60 mg into the skin every 6 (six) months.     furosemide  (LASIX ) 20 MG tablet TAKE 1 TABLET BY MOUTH ONCE DAILY 30 tablet 3   KLOR-CON  M10 10 MEQ tablet Take 10 mEq by mouth daily.     levothyroxine  (SYNTHROID ) 125 MCG tablet Take 125 mcg by mouth daily before breakfast.     loratadine  (CLARITIN ) 10 MG tablet Take 10 mg by mouth daily.     lovastatin (MEVACOR) 20 MG tablet Take 20 mg by mouth at bedtime.     Lubricants (LUBRICATING JELLY) GEL Place 1 application  vaginally in the morning.     metoprolol  succinate (TOPROL -XL) 50 MG 24 hr tablet Take 50 mg by mouth in the morning. 8:00     montelukast  (SINGULAIR ) 10 MG tablet Take 10 mg by mouth at bedtime.     Multiple Vitamins-Minerals ([employer] ADULT 50+ EYE  HEALTH) CAPS Take 1 capsule by mouth in the morning.     mupirocin  ointment (BACTROBAN ) 2 % Apply 1 application. topically 2 (two) times daily. Apply to area on foot. (Patient taking differently: Apply 1 application  topically 2 (two) times daily. Nose) 15 g 0   Omega-3 Fatty Acids (FISH OIL) 1000 MG CAPS Take 1,000 mg by mouth daily after breakfast.     omeprazole (PRILOSEC) 40 MG capsule Take 40 mg by mouth at bedtime.     polyethylene glycol (MIRALAX  / GLYCOLAX ) packet Take 8.5 g by mouth every other day.     potassium chloride  (K-DUR) 10 MEQ tablet Take 1  tablet (10 mEq total) by mouth daily. 90 tablet 3   sodium chloride  (OCEAN) 0.65 % SOLN nasal spray Place 1 spray into both nostrils 3 (three) times daily as needed (nasal dryness).     No current facility-administered medications for this encounter.    Physical Exam: BP 124/70   Pulse 75   Ht 5' 1 (1.549 m)   Wt 61.1 kg   BMI 25.47 kg/m   GEN: Well nourished, well developed in no acute distress CARDIAC: Regular rate and rhythm, no murmurs, rubs, gallops RESPIRATORY:  Clear to auscultation without rales, wheezing or rhonchi  ABDOMEN: Soft, non-tender, non-distended EXTREMITIES:  No edema; No deformity    Wt Readings from Last 3 Encounters:  02/17/24 61.1 kg  01/22/24 62.8 kg  11/18/23 63.7 kg     EKG today demonstrates  AV dual paced rhythm Vent. rate 75 BPM PR interval 210 ms QRS duration 136 ms QT/QTcB 438/489 ms   Echo 07/24/22 demonstrated   1. Left ventricular ejection fraction, by estimation, is 50 to 55%. The  left ventricle has low normal function. The left ventricle has no regional  wall motion abnormalities. Left ventricular diastolic parameters were  normal.   2. Right ventricular systolic function is normal. The right ventricular  size is normal. Tricuspid regurgitation signal is inadequate for assessing  PA pressure.   3. The mitral valve is normal in structure. Moderate mitral valve  regurgitation. No evidence of mitral stenosis.   4. The aortic valve is tricuspid. Aortic valve regurgitation is not  visualized. Aortic valve sclerosis is present, with no evidence of aortic  valve stenosis.   5. The inferior vena cava is normal in size with greater than 50%  respiratory variability, suggesting right atrial pressure of 3 mmHg.    CHA2DS2-VASc Score = 3  The patient's score is based upon: CHF History: 0 HTN History: 0 Diabetes History: 0 Stroke History: 0 Vascular Disease History: 0 Age Score: 2 Gender Score: 1       ASSESSMENT AND  PLAN: Persistent Atrial Fibrillation (ICD10:  I48.19) The patient's CHA2DS2-VASc score is 3, indicating a 3.2% annual risk of stroke.   S/p DCCV 01/28/24 Patient appears to be maintaining SR Continue amiodarone  200 mg daily Continue Eliquis  5 mg BID Continue Toprol  50 mg daily  Secondary Hypercoagulable State (ICD10:  D68.69) The patient is at significant risk for stroke/thromboembolism based upon her CHA2DS2-VASc Score of 3.  Continue Apixaban  (Eliquis ). No bleeding issues.   High Risk Medication Monitoring (ICD 10: J342684) Patient requires ongoing monitoring for anti-arrhythmic medication which has the potential to cause life threatening arrhythmias. Intervals on ECG acceptable for amiodarone  monitoring. Check cmet/TSH at next visit.   CHB S/p PPM, followed by Dr Samantha [name]   Follow up in the AF clinic in 3 months.    Daril Kicks PA-C  [facility] [address] [place], [place] [address] [phone]

## 2024-02-23 DIAGNOSIS — Z79899 Other long term (current) drug therapy: Secondary | ICD-10-CM | POA: Diagnosis not present

## 2024-03-01 DIAGNOSIS — I480 Paroxysmal atrial fibrillation: Secondary | ICD-10-CM | POA: Diagnosis not present

## 2024-03-01 DIAGNOSIS — E039 Hypothyroidism, unspecified: Secondary | ICD-10-CM | POA: Diagnosis not present

## 2024-03-01 DIAGNOSIS — E7800 Pure hypercholesterolemia, unspecified: Secondary | ICD-10-CM | POA: Diagnosis not present

## 2024-03-01 DIAGNOSIS — I5032 Chronic diastolic (congestive) heart failure: Secondary | ICD-10-CM | POA: Diagnosis not present

## 2024-03-22 DIAGNOSIS — H7292 Unspecified perforation of tympanic membrane, left ear: Secondary | ICD-10-CM | POA: Diagnosis not present

## 2024-03-22 DIAGNOSIS — H9212 Otorrhea, left ear: Secondary | ICD-10-CM | POA: Diagnosis not present

## 2024-03-22 DIAGNOSIS — H6121 Impacted cerumen, right ear: Secondary | ICD-10-CM | POA: Diagnosis not present

## 2024-03-24 DIAGNOSIS — Z0000 Encounter for general adult medical examination without abnormal findings: Secondary | ICD-10-CM | POA: Diagnosis not present

## 2024-03-24 DIAGNOSIS — M810 Age-related osteoporosis without current pathological fracture: Secondary | ICD-10-CM | POA: Diagnosis not present

## 2024-03-24 DIAGNOSIS — E7800 Pure hypercholesterolemia, unspecified: Secondary | ICD-10-CM | POA: Diagnosis not present

## 2024-03-24 DIAGNOSIS — E039 Hypothyroidism, unspecified: Secondary | ICD-10-CM | POA: Diagnosis not present

## 2024-03-24 DIAGNOSIS — Z23 Encounter for immunization: Secondary | ICD-10-CM | POA: Diagnosis not present

## 2024-03-24 DIAGNOSIS — Z79899 Other long term (current) drug therapy: Secondary | ICD-10-CM | POA: Diagnosis not present

## 2024-03-24 DIAGNOSIS — Z1331 Encounter for screening for depression: Secondary | ICD-10-CM | POA: Diagnosis not present

## 2024-03-25 DIAGNOSIS — M810 Age-related osteoporosis without current pathological fracture: Secondary | ICD-10-CM | POA: Diagnosis not present

## 2024-03-29 ENCOUNTER — Other Ambulatory Visit: Payer: Self-pay

## 2024-03-29 ENCOUNTER — Ambulatory Visit: Admitting: Orthopaedic Surgery

## 2024-03-29 ENCOUNTER — Other Ambulatory Visit (INDEPENDENT_AMBULATORY_CARE_PROVIDER_SITE_OTHER): Payer: Self-pay

## 2024-03-29 DIAGNOSIS — M545 Low back pain, unspecified: Secondary | ICD-10-CM

## 2024-03-29 DIAGNOSIS — G8929 Other chronic pain: Secondary | ICD-10-CM | POA: Diagnosis not present

## 2024-03-29 DIAGNOSIS — M79604 Pain in right leg: Secondary | ICD-10-CM

## 2024-03-29 NOTE — Progress Notes (Signed)
 Office Visit Note   Patient: Samantha [name]           Date of Birth: [birth year]           MRN: [id number] Visit Date: 03/29/2024              Requested by: Dwight Trula SQUIBB, MD [address] [place],  [place] [address] PCP: Dwight Trula SQUIBB, MD   Assessment & Plan: Visit Diagnoses:  1. Pain in right leg   2. Chronic midline low back pain without sciatica     Plan: History of Present Illness Samantha [name] is a 89 year old female with a history of right proximal tibia fracture who presents with right knee and leg pain.  She experiences sharp, stabbing pain on the lateral side of her knee, which began at the end of September while visiting her son. This is the same leg where she previously had a tibial nail inserted. The pain was severe enough to prevent walking for a day or two. Shoe inserts from a drugstore helped alleviate the pain.  In October, after returning home, she began experiencing an ache on her left side, distinct from the sharp knee pain. The ache persists slightly today. She has a bone density test scheduled for the end of the week.  No current back pain, but she describes sensitivity to touch in her legs, which have been numb for at least three years. She inquires about the crookedness of her leg, attributing it to the previous injury.  Physical Exam Right knee exam: healed surgical scars, slight valgus and procurvatum deformity to proximal tibia, no joint effusion, baseline range of motion, collaterals stable.  Assessment and Plan Right knee osteoarthritis with chondrocalcinosis Chronic right knee osteoarthritis with intermittent pain, currently managed with shoe inserts. X-rays confirm arthritis. - Consider cortisone injection if pain recurs.  Healed right tibia fracture with residual valgus deformity Healed right tibia fracture with residual valgus deformity. Surgical correction not recommended due to age and recovery concerns.  Spinal  osteoarthritis Chronic spinal osteoarthritis with left-sided pain, possibly related to sciatic nerve issues. - Continue home exercises for back pain.  Follow-Up Instructions: No follow-ups on file.   Orders:  Orders Placed This Encounter  Procedures   XR Tibia/Fibula Right   XR Lumbar Spine 2-3 Views   No orders of the defined types were placed in this encounter.     Procedures: No procedures performed   Clinical Data: No additional findings.   Subjective: Chief Complaint  Patient presents with   Right Leg - Pain    HPI  Review of Systems  Constitutional: Negative.   HENT: Negative.    Eyes: Negative.   Respiratory: Negative.    Cardiovascular: Negative.   Endocrine: Negative.   Musculoskeletal: Negative.   Neurological: Negative.   Hematological: Negative.   Psychiatric/Behavioral: Negative.    All other systems reviewed and are negative.    Objective: Vital Signs: There were no vitals taken for this visit.  Physical Exam Vitals and nursing note reviewed.  Constitutional:      Appearance: She is well-developed.  HENT:     Head: Atraumatic.     Nose: Nose normal.  Eyes:     Extraocular Movements: Extraocular movements intact.  Cardiovascular:     Pulses: Normal pulses.  Pulmonary:     Effort: Pulmonary effort is normal.  Abdominal:     Palpations: Abdomen is soft.  Musculoskeletal:     Cervical back:  Neck supple.  Skin:    General: Skin is warm.     Capillary Refill: Capillary refill takes less than 2 seconds.  Neurological:     Mental Status: She is alert. Mental status is at baseline.  Psychiatric:        Behavior: Behavior normal.        Thought Content: Thought content normal.        Judgment: Judgment normal.     Ortho Exam  Specialty Comments:  No specialty comments available.  Imaging: XR Lumbar Spine 2-3 Views Result Date: 03/29/2024 X-rays show age-appropriate diffuse degenerative changes.  No acute abnormalities.  XR  Tibia/Fibula Right Result Date: 03/29/2024 X-rays of the right tibia show a tibial nail with associated interlocking screws from a prior surgery.  The fracture is healed in slight valgus and procurvatum.  There is slight degenerative changes within the knee joint with chondrocalcinosis.    PMFS History: Patient Active Problem List   Diagnosis Date Noted   Persistent atrial fibrillation (HCC) 01/28/2024   Excessive cerumen in both ear canals 06/01/2023   Complete heart block (HCC) 08/05/2022   Nasal dryness 06/11/2022   Anxiety 01/15/2022   Tick bite 10/10/2021   Cellulitis 10/09/2021   Blood clotting disorder 11/20/2020   Age-related osteoporosis without current pathological fracture 07/30/2020   Hardening of the aorta (main artery of the heart) 07/30/2020   Heart failure (HCC) 07/30/2020   Intestinal malabsorption 07/30/2020   Mild intermittent asthma 07/30/2020   Pure hypercholesterolemia 07/30/2020   Thrombophilia 07/30/2020   Neck pain 04/03/2020   Long term (current) use of anticoagulants 03/05/2020   Unilateral primary osteoarthritis, right knee 01/26/2020   BPPV (benign paroxysmal positional vertigo), right 05/13/2019   Anticoagulated 05/13/2019   Epistaxis 05/13/2019   Carpal tunnel syndrome, right upper limb 04/12/2019   Trigger thumb, left thumb 02/10/2019   Numbness and tingling in right hand 02/10/2019   Pain in left foot 02/10/2019   Closed fracture of right proximal tibia 06/22/2018   Paroxysmal atrial fibrillation (HCC) 05/29/2018   Hypokalemia 05/29/2018   Pain and swelling of right lower extremity 05/28/2018   Closed fracture of shaft of tibia 04/29/2018   Aftercare 04/23/2018   SAH (subarachnoid hemorrhage) (HCC) 04/02/2018   Chronic bilateral low back pain with bilateral sciatica 03/08/2018   Abnormal CT scan, chest 08/08/2016   Hearing loss 09/10/2015   Impacted cerumen of right ear 09/10/2015   Otorrhea of left ear 09/10/2015   Tympanic membrane  perforation, left 09/10/2015   Near syncope 01/06/2015   Left leg weakness 01/06/2015   Hyperlipidemia    Hypothyroidism    Ventricular ectopy    Chest pain 05/22/2014   Left bundle branch block 05/22/2014   PVC's (premature ventricular contractions) 05/22/2014   HOARSENESS 02/14/2010   CHEST PAIN, ATYPICAL 02/14/2010   Allergic rhinitis 02/13/2010   G E R D 02/13/2010   Past Medical History:  Diagnosis Date   Arthritis    Asthma    GERD (gastroesophageal reflux disease)    High cholesterol    Hypothyroidism    LBBB (left bundle branch block)    Ventricular ectopy     Family History  Problem Relation Age of Onset   Heart attack Mother    Throat cancer Father    Congestive Heart Failure Sister    Congestive Heart Failure Brother     Past Surgical History:  Procedure Laterality Date   CARDIOVERSION N/A 11/27/2015   Procedure: CARDIOVERSION;  Surgeon: Maude BROCKS  Delford, MD;  Location: [facility] ENDOSCOPY;  Service: Cardiovascular;  Laterality: N/A;   CARDIOVERSION N/A 01/28/2024   Procedure: CARDIOVERSION;  Surgeon: Raford Riggs, MD;  Location: [facility] INVASIVE CV LAB;  Service: Cardiovascular;  Laterality: N/A;   KNEE ARTHROSCOPY     PACEMAKER IMPLANT N/A 08/07/2022   Procedure: PACEMAKER IMPLANT;  Surgeon: Inocencio Soyla Lunger, MD;  Location: [facility] INVASIVE CV LAB;  Service: Cardiovascular;  Laterality: N/A;   TIBIA IM NAIL INSERTION Right 04/05/2018   Procedure: INTRAMEDULLARY (IM) NAIL TIBIAL;  Surgeon: Sharl Selinda Dover, MD;  Location: [facility] OR;  Service: Orthopedics;  Laterality: Right;   Social History   Occupational History   Not on file  Tobacco Use   Smoking status: Never    Passive exposure: Yes   Smokeless tobacco: Never   Tobacco comments:    Never smoked 02/17/24  Vaping Use   Vaping status: Never Used  Substance and Sexual Activity   Alcohol use: No    Alcohol/week: 0.0 standard drinks of alcohol   Drug use: No   Sexual activity: Not Currently

## 2024-04-01 DIAGNOSIS — E039 Hypothyroidism, unspecified: Secondary | ICD-10-CM | POA: Diagnosis not present

## 2024-04-01 DIAGNOSIS — E7800 Pure hypercholesterolemia, unspecified: Secondary | ICD-10-CM | POA: Diagnosis not present

## 2024-04-01 DIAGNOSIS — I5032 Chronic diastolic (congestive) heart failure: Secondary | ICD-10-CM | POA: Diagnosis not present

## 2024-04-01 DIAGNOSIS — I480 Paroxysmal atrial fibrillation: Secondary | ICD-10-CM | POA: Diagnosis not present

## 2024-04-06 ENCOUNTER — Telehealth: Payer: Self-pay | Admitting: Podiatry

## 2024-04-26 ENCOUNTER — Other Ambulatory Visit: Payer: Self-pay

## 2024-04-26 ENCOUNTER — Encounter (HOSPITAL_COMMUNITY): Payer: Self-pay

## 2024-04-26 ENCOUNTER — Ambulatory Visit (HOSPITAL_COMMUNITY)
Admission: RE | Admit: 2024-04-26 | Discharge: 2024-04-26 | Disposition: A | Discharge status: Home / Self Care | Source: Ambulatory Visit | Attending: Family Medicine | Admitting: Family Medicine

## 2024-04-26 VITALS — BP 143/84 | HR 78 | Temp 97.7°F | Resp 20

## 2024-04-26 DIAGNOSIS — R21 Rash and other nonspecific skin eruption: Secondary | ICD-10-CM | POA: Diagnosis not present

## 2024-04-26 MED ORDER — CEPHALEXIN 500 MG PO CAPS: 500.0000 mg | ORAL_CAPSULE | Freq: Three times a day (TID) | ORAL | 0 refills | Status: DC

## 2024-04-26 NOTE — ED Triage Notes (Signed)
 On Saturday 11/22, noticed red, warm area to left upper arm.    There is a bruise above this area that patient reports getting pneumonia vaccine on Monday 11/17.    Red area itches slightly  Patient has been using mupirocin  and betamethasone cream .

## 2024-04-26 NOTE — ED Provider Notes (Signed)
 [facility] CARE CENTER   [id number] 04/26/24 Arrival Time: 1123  ASSESSMENT & PLAN:  1. Rash and nonspecific skin eruption    Will cover for the possibility of cellulitis; she is very anxious having had cellulitis in the past requiring hospital admission. Begin: Meds ordered this encounter  Medications   cephALEXin  (KEFLEX ) 500 MG capsule    Sig: Take 1 capsule (500 mg total) by mouth 3 (three) times daily.    Dispense:  21 capsule    Refill:  0   Multiple medication allergies but she has taken Keflex  in the past without issue; discussed this with her and she is comfortable taking Keflex .  Follow-up Information     Dwight Trula SQUIBB, MD.   Specialty: Internal Medicine Why: As needed. Contact information: [address] [place] [address] [phone]         [facility] Emergency Department at [facility].   Specialty: Emergency Medicine Why: If symptoms worsen in any way. Contact information: [address] [place]  [phone]                Reviewed expectations re: course of current medical issues. Questions answered. Outlined signs and symptoms indicating need for more acute intervention. Patient verbalized understanding. After Visit Summary given.   SUBJECTIVE:  Samantha [name] is a 89 y.o. female who presents with a skin complaint. Noted red area on arm sev days ago; approx 4-5 d after receiving PNA vaccine in the same arm. Reports area feels very warm and she thinks the redness has spread since noting. Denies fever.    OBJECTIVE: Vitals:   04/26/24 1152  BP: (!) 143/84  Pulse: 78  Resp: 20  Temp: 97.7 F (36.5 C)  TempSrc: Oral  SpO2: 95%    General appearance: alert; no distress HEENT: [place]; AT Neck: supple with FROM Lungs: clear to auscultation bilaterally Heart: regular rate and rhythm Extremities: no edema; moves all extremities normally Skin: warm and dry; approx 6x8 cm area  of erythema on mid upper LEFT arm; hot to touch; irreg borders; without induration Psychological: alert and cooperative; normal mood and affect  Allergies  Allergen Reactions   Erythromycin Diarrhea and Rash   Vibra -Tab [Doxycycline ] Hives   Benzalkonium Chloride Rash   Erythromycin Rash   Fosamax [Alendronate] Nausea Only and Other (See Comments)    Severe stomach cramps   Neo-Bacit-Poly-Lidocaine  Rash   Neomy-Bacit-Polymyx-Pramoxine Rash   Neosporin [Neomycin-Bacitracin Zn-Polymyx] Rash   Penicillins Rash    DID THE REACTION INVOLVE: Swelling of the face/tongue/throat, SOB, or low BP? N Sudden or severe rash/hives, skin peeling, or the inside of the mouth or nose? Y Did it require medical treatment? N When did it last happen?      Childhood If all above answers are NO, may proceed with cephalosporin use.   Zithromax [Azithromycin] Diarrhea    Past Medical History:  Diagnosis Date   Arthritis    Asthma    GERD (gastroesophageal reflux disease)    High cholesterol    Hypothyroidism    LBBB (left bundle branch block)    Ventricular ectopy    Social History   Socioeconomic History   Marital status: Widowed    Spouse name: Not on file   Number of children: Not on file   Years of education: Not on file   Highest education level: Not on file  Occupational History   Not on file  Tobacco Use   Smoking status:  Never    Passive exposure: Yes   Smokeless tobacco: Never   Tobacco comments:    Never smoked 02/17/24  Vaping Use   Vaping status: Never Used  Substance and Sexual Activity   Alcohol use: No    Alcohol/week: 0.0 standard drinks of alcohol   Drug use: No   Sexual activity: Not Currently  Other Topics Concern   Not on file  Social History Narrative   ** Merged History Encounter **       Social Drivers of Health   Financial Resource Strain: Not on file  Food Insecurity: No Food Insecurity (08/05/2022)   Hunger Vital Sign    Worried About Running Out of  Food in the Last Year: Never true    Ran Out of Food in the Last Year: Never true  Transportation Needs: No Transportation Needs (08/05/2022)   PRAPARE - Administrator, Civil Service (Medical): No    Lack of Transportation (Non-Medical): No  Physical Activity: Not on file  Stress: Not on file  Social Connections: Not on file  Intimate Partner Violence: Not At Risk (08/05/2022)   Humiliation, Afraid, Rape, and Kick questionnaire    Fear of Current or Ex-Partner: No    Emotionally Abused: No    Physically Abused: No    Sexually Abused: No   Family History  Problem Relation Age of Onset   Heart attack Mother    Throat cancer Father    Congestive Heart Failure Sister    Congestive Heart Failure Brother    Past Surgical History:  Procedure Laterality Date   CARDIOVERSION N/A 11/27/2015   Procedure: CARDIOVERSION;  Surgeon: Maude JAYSON Emmer, MD;  Location: [facility] ENDOSCOPY;  Service: Cardiovascular;  Laterality: N/A;   CARDIOVERSION N/A 01/28/2024   Procedure: CARDIOVERSION;  Surgeon: Raford Riggs, MD;  Location: [facility] INVASIVE CV LAB;  Service: Cardiovascular;  Laterality: N/A;   KNEE ARTHROSCOPY     PACEMAKER IMPLANT N/A 08/07/2022   Procedure: PACEMAKER IMPLANT;  Surgeon: Inocencio Soyla Lunger, MD;  Location: [facility] INVASIVE CV LAB;  Service: Cardiovascular;  Laterality: N/A;   TIBIA IM NAIL INSERTION Right 04/05/2018   Procedure: INTRAMEDULLARY (IM) NAIL TIBIAL;  Surgeon: Sharl Selinda Dover, MD;  Location: [facility] OR;  Service: Orthopedics;  Laterality: Right;      Rolinda Rogue, MD 04/26/24 1312

## 2024-05-01 DIAGNOSIS — E7800 Pure hypercholesterolemia, unspecified: Secondary | ICD-10-CM | POA: Diagnosis not present

## 2024-05-01 DIAGNOSIS — I480 Paroxysmal atrial fibrillation: Secondary | ICD-10-CM | POA: Diagnosis not present

## 2024-05-01 DIAGNOSIS — E039 Hypothyroidism, unspecified: Secondary | ICD-10-CM | POA: Diagnosis not present

## 2024-05-01 DIAGNOSIS — I5032 Chronic diastolic (congestive) heart failure: Secondary | ICD-10-CM | POA: Diagnosis not present

## 2024-05-06 ENCOUNTER — Ambulatory Visit: Payer: Medicare HMO

## 2024-05-06 DIAGNOSIS — I4819 Other persistent atrial fibrillation: Secondary | ICD-10-CM | POA: Diagnosis not present

## 2024-05-08 LAB — CUP PACEART REMOTE DEVICE CHECK
Battery Remaining Longevity: 74 mo
Battery Remaining Percentage: 84 %
Battery Voltage: 2.98 V
Brady Statistic AP VP Percent: 56 %
Brady Statistic AP VS Percent: 13 %
Brady Statistic AS VP Percent: 19 %
Brady Statistic AS VS Percent: 11 %
Brady Statistic RA Percent Paced: 69 %
Brady Statistic RV Percent Paced: 76 %
Date Time Interrogation Session: 20251205020025
Implantable Lead Connection Status: 753985
Implantable Lead Connection Status: 753985
Implantable Lead Implant Date: 20240307
Implantable Lead Implant Date: 20240307
Implantable Lead Location: 753859
Implantable Lead Location: 753860
Implantable Pulse Generator Implant Date: 20240307
Lead Channel Impedance Value: 360 Ohm
Lead Channel Impedance Value: 540 Ohm
Lead Channel Pacing Threshold Amplitude: 0.5 V
Lead Channel Pacing Threshold Amplitude: 0.75 V
Lead Channel Pacing Threshold Pulse Width: 0.5 ms
Lead Channel Pacing Threshold Pulse Width: 0.5 ms
Lead Channel Sensing Intrinsic Amplitude: 1.5 mV
Lead Channel Sensing Intrinsic Amplitude: 6.5 mV
Lead Channel Setting Pacing Amplitude: 2.5 V
Lead Channel Setting Pacing Amplitude: 2.5 V
Lead Channel Setting Pacing Pulse Width: 0.5 ms
Lead Channel Setting Sensing Sensitivity: 2 mV
Pulse Gen Model: 2272
Pulse Gen Serial Number: 8163768

## 2024-05-10 ENCOUNTER — Telehealth: Payer: Self-pay | Admitting: Cardiovascular Disease

## 2024-05-10 NOTE — Progress Notes (Signed)
 Remote PPM Transmission

## 2024-05-10 NOTE — Telephone Encounter (Signed)
 [facility] called to verify if pt has chronic condition that qualifies them for a program. Please advise

## 2024-05-10 NOTE — Telephone Encounter (Signed)
 Spoke with Samantha [name] at [facility] in regards to patient plan. Confirmed patient having cardiac arrhythmia (atrial fibrillation), per Dr. Inocencio recent visit 02/17/2024.  Faxing over confirmation to [phone]

## 2024-05-11 ENCOUNTER — Ambulatory Visit: Payer: Self-pay | Admitting: Cardiology

## 2024-05-14 DIAGNOSIS — M8589 Other specified disorders of bone density and structure, multiple sites: Secondary | ICD-10-CM | POA: Diagnosis not present

## 2024-05-17 ENCOUNTER — Ambulatory Visit: Admitting: Podiatry

## 2024-05-17 ENCOUNTER — Encounter: Payer: Self-pay | Admitting: Podiatry

## 2024-05-17 DIAGNOSIS — M79676 Pain in unspecified toe(s): Secondary | ICD-10-CM

## 2024-05-17 DIAGNOSIS — B351 Tinea unguium: Secondary | ICD-10-CM

## 2024-05-17 DIAGNOSIS — I739 Peripheral vascular disease, unspecified: Secondary | ICD-10-CM | POA: Diagnosis not present

## 2024-05-17 DIAGNOSIS — D689 Coagulation defect, unspecified: Secondary | ICD-10-CM | POA: Diagnosis not present

## 2024-05-19 ENCOUNTER — Ambulatory Visit (HOSPITAL_COMMUNITY)
Admission: RE | Admit: 2024-05-19 | Discharge: 2024-05-19 | Attending: Physician Assistant | Admitting: Physician Assistant

## 2024-05-19 VITALS — BP 132/70 | HR 75 | Ht 61.0 in | Wt 138.8 lb

## 2024-05-19 DIAGNOSIS — I4891 Unspecified atrial fibrillation: Secondary | ICD-10-CM

## 2024-05-19 DIAGNOSIS — Z79899 Other long term (current) drug therapy: Secondary | ICD-10-CM | POA: Diagnosis not present

## 2024-05-19 DIAGNOSIS — I4819 Other persistent atrial fibrillation: Secondary | ICD-10-CM

## 2024-05-19 DIAGNOSIS — Z5181 Encounter for therapeutic drug level monitoring: Secondary | ICD-10-CM | POA: Diagnosis not present

## 2024-05-19 DIAGNOSIS — D6869 Other thrombophilia: Secondary | ICD-10-CM

## 2024-05-19 MED ORDER — AMIODARONE HCL 200 MG PO TABS: 200.0000 mg | ORAL_TABLET | Freq: Every day | ORAL | 2 refills | Status: AC

## 2024-05-19 NOTE — Progress Notes (Signed)
 Primary Care Physician: Dwight Trula SQUIBB, MD Primary Cardiologist: Maude Emmer, MD Electrophysiologist: Will Gladis Norton, MD  Referring Physician: Dr Norton Elyn FORBES Samantha [name] is a 89 y.o. female with a history of CHB s/p PPM, HLD, atrial fibrillation who presents for follow up in the [facility]rial Fibrillation [facility].  The patient was in persistent afib at her visit with Dr Norton on 11/18/23. She was started on amiodarone . Patient is on Eliquis  for stroke prevention. She is s/p DCCV on 01/28/24.   Patient returns for follow up for atrial fibrillation and amiodarone  monitoring. She remains in SR today and feels well. Her PPM shows <1% afib burden. No bleeding issues on anticoagulation.   Today, she  denies symptoms of palpitations, chest pain, shortness of breath, orthopnea, PND, lower extremity edema, dizziness, presyncope, syncope, snoring, daytime somnolence, bleeding, or neurologic sequela. The patient is tolerating medications without difficulties and is otherwise without complaint today.    Atrial Fibrillation Risk Factors:  she does not have symptoms or diagnosis of sleep apnea. she does not have a history of rheumatic fever.   Atrial Fibrillation Management history:  Previous antiarrhythmic drugs: amiodarone   Previous cardioversions: 11/27/15, 01/28/24 Previous ablations: none Anticoagulation history: Eliquis   ROS- All systems are reviewed and negative except as per the HPI above.  Past Medical History:  Diagnosis Date   Arthritis    Asthma    GERD (gastroesophageal reflux disease)    High cholesterol    Hypothyroidism    LBBB (left bundle branch block)    Ventricular ectopy     Current Outpatient Medications  Medication Sig Dispense Refill   albuterol  (VENTOLIN  HFA) 108 (90 Base) MCG/ACT inhaler Inhale 1-2 puffs into the lungs every 6 (six) hours as needed for wheezing or shortness of breath. 18 g 0   apixaban  (ELIQUIS ) 5 MG TABS tablet Take 1 tablet (5 mg  total) by mouth in the morning and at bedtime. 60 tablet    augmented betamethasone dipropionate (DIPROLENE-AF) 0.05 % cream Apply topically 2 (two) times daily.     Calcium-Magnesium -Zinc (CALCIUM-MAGNESUIUM-ZINC PO) Take 1 tablet by mouth every Monday, Wednesday, and Friday. 1000 mg /400 mg /15 mg     Cholecalciferol (VITAMIN D3) 50 MCG (2000 UT) CAPS Take 2,000 Units by mouth in the morning.     cyanocobalamin (VITAMIN B12) 1000 MCG tablet Take 1,000 mcg by mouth every Monday, Wednesday, and Friday.     denosumab  (PROLIA ) 60 MG/ML SOSY injection Inject 60 mg into the skin every 6 (six) months.     furosemide  (LASIX ) 20 MG tablet TAKE 1 TABLET BY MOUTH ONCE DAILY 30 tablet 3   KLOR-CON  M10 10 MEQ tablet Take 10 mEq by mouth daily.     levothyroxine  (SYNTHROID ) 125 MCG tablet Take 125 mcg by mouth daily before breakfast.     loratadine  (CLARITIN ) 10 MG tablet Take 10 mg by mouth daily.     lovastatin (MEVACOR) 20 MG tablet Take 20 mg by mouth at bedtime.     Lubricants (LUBRICATING JELLY) GEL Place 1 application  vaginally in the morning.     metoprolol  succinate (TOPROL -XL) 50 MG 24 hr tablet Take 50 mg by mouth in the morning. 8:00     montelukast  (SINGULAIR ) 10 MG tablet Take 10 mg by mouth at bedtime.     Multiple Vitamins-Minerals ([employer] ADULT 50+ EYE HEALTH) CAPS Take 1 capsule by mouth in the morning.     mupirocin  ointment (BACTROBAN ) 2 % Apply  1 application. topically 2 (two) times daily. Apply to area on foot. (Patient taking differently: Apply 1 application  topically 2 (two) times daily. Nose) 15 g 0   Omega-3 Fatty Acids (FISH OIL) 1000 MG CAPS Take 1,000 mg by mouth daily after breakfast.     omeprazole (PRILOSEC) 40 MG capsule Take 40 mg by mouth at bedtime.     polyethylene glycol (MIRALAX  / GLYCOLAX ) packet Take 8.5 g by mouth every other day.     potassium chloride  (K-DUR) 10 MEQ tablet Take 1 tablet (10 mEq total) by mouth daily. 90 tablet 3   sodium chloride  (OCEAN) 0.65 % SOLN  nasal spray Place 1 spray into both nostrils 3 (three) times daily as needed (nasal dryness). (Patient taking differently: Place 1 spray into both nostrils as needed (nasal dryness).)     amiodarone  (PACERONE ) 200 MG tablet Take 1 tablet (200 mg total) by mouth daily. Take as directed 90 tablet 2   No current facility-administered medications for this encounter.    Physical Exam: BP 132/70   Pulse 75   Ht 5' 1 (1.549 m)   Wt 63 kg   BMI 26.23 kg/m   GEN: Well nourished, well developed in no acute distress CARDIAC: Regular rate and rhythm, no murmurs, rubs, gallops RESPIRATORY:  Clear to auscultation without rales, wheezing or rhonchi  ABDOMEN: Soft, non-tender, non-distended EXTREMITIES:  No edema; No deformity    Wt Readings from Last 3 Encounters:  05/19/24 63 kg  02/17/24 61.1 kg  01/22/24 62.8 kg     EKG Interpretation Date/Time:  Thursday May 19 2024 14:06:46 EST Ventricular Rate:  75 PR Interval:  212 QRS Duration:  130 QT Interval:  452 QTC Calculation: 504 R Axis:   -86  Text Interpretation: Atrial-sensed ventricular-paced rhythm with prolonged AV conduction Abnormal ECG When compared with ECG of 17-Feb-2024 14:44, No significant change was found Confirmed by Laquinda Moller ([id number]) on 05/19/2024 2:42:58 PM    Echo 07/24/22 demonstrated   1. Left ventricular ejection fraction, by estimation, is 50 to 55%. The  left ventricle has low normal function. The left ventricle has no regional  wall motion abnormalities. Left ventricular diastolic parameters were  normal.   2. Right ventricular systolic function is normal. The right ventricular  size is normal. Tricuspid regurgitation signal is inadequate for assessing  PA pressure.   3. The mitral valve is normal in structure. Moderate mitral valve  regurgitation. No evidence of mitral stenosis.   4. The aortic valve is tricuspid. Aortic valve regurgitation is not  visualized. Aortic valve sclerosis is present, with  no evidence of aortic  valve stenosis.   5. The inferior vena cava is normal in size with greater than 50%  respiratory variability, suggesting right atrial pressure of 3 mmHg.    CHA2DS2-VASc Score = 3  The patient's score is based upon: CHF History: 0 HTN History: 0 Diabetes History: 0 Stroke History: 0 Vascular Disease History: 0 Age Score: 2 Gender Score: 1       ASSESSMENT AND PLAN: Persistent Atrial Fibrillation (ICD10:  I48.19) The patient's CHA2DS2-VASc score is 3, indicating a 3.2% annual risk of stroke.   Patient appears to be maintaining SR Continue amiodarone  200 mg daily Continue Eliquis  5 mg BID Continue Toprol  50 mg daily  Secondary Hypercoagulable State (ICD10:  D68.69) The patient is at significant risk for stroke/thromboembolism based upon her CHA2DS2-VASc Score of 3.  Continue Apixaban  (Eliquis ). No bleeding issues.   High Risk Medication Monitoring (  ICD 10: Z79.899) Patient requires ongoing monitoring for anti-arrhythmic medication which has the potential to cause life threatening arrhythmias. Intervals on ECG acceptable for amiodarone  monitoring. Recent labs in care everywhere reviewed.   CHB S/p PPM, followed by Dr Inocencio   Follow up with Dr Inocencio in 6 months and AF clinic in one year.    [facility] [address] [place], [place] [address] [phone]

## 2024-05-22 ENCOUNTER — Encounter: Payer: Self-pay | Admitting: Podiatry

## 2024-05-22 NOTE — Progress Notes (Signed)
 "  Subjective:  Patient ID: Samantha [name], female    DOB: Jun 26, 1929,  MRN: [id number]  Samantha [name] presents to clinic today for at risk foot care. Patient has h/o PAD and clotting disorder. She is seen for painful thick toenails that are difficult to trim. Pain interferes with ambulation. Aggravating factors include wearing enclosed shoe gear. Pain is relieved with periodic professional debridement. She is accompanied by her granddaughter on today's visit. Chief Complaint  Patient presents with   Nail Problem    She cuts my toenails.   New problem(s): None.   PCP is Dwight Trula SQUIBB, MD.  Allergies[1]  Review of Systems: Negative except as noted in the HPI.  Objective: No changes noted in today's physical examination. There were no vitals filed for this visit. [name] is a pleasant 89 y.o. female WD, WN in NAD. Samantha [name] x 3.  Vascular Examination: Capillary refill time to digits immediate b/l. Palpable DP pulse(s) b/l LE. Diminished PT pulse(s) b/l LE. Pedal hair sparse. No pain with calf compression b/l. Edema left ankle. Varicosities present b/l. No cyanosis or clubbing noted b/l LE.Samantha [name]  Dermatological Examination: Pedal skin with normal turgor, texture and tone b/l. No open wounds. No interdigital macerations b/l. Toenails 1-5 b/l thickened, discolored, dystrophic with subungual debris. There is pain on palpation to dorsal aspect of nailplates. Minimal hyperkeratos(is/es) noted R 3rd toe.  Neurological Examination: Protective sensation intact with 10 gram monofilament b/l LE. Vibratory sensation intact b/l LE.   Musculoskeletal Examination: Muscle strength 5/5 to all lower extremity muscle groups bilaterally. Tailor's bunion deformity noted b/l LE. Hammertoe deformity noted 2-5 b/l. Utilizes cane for ambulation assistance.  Assessment/Plan: 1. Pain due to onychomycosis of toenail   2. Blood clotting disorder   3. PAD (peripheral artery disease)   Consent given for treatment.  Patient examined. All patient's and/or POA's questions/concerns addressed on today's visit. Toenails 1-5 b/l debrided in length and girth without incident. Continue foot and shoe inspections daily. Monitor blood glucose per PCP/Endocrinologist's recommendations. Continue soft, supportive shoe gear daily. Report any pedal injuries to medical professional. Call office if there are any questions/concerns. -Patient/POA to call should there be question/concern in the interim.   Return in about 3 months (around 08/15/2024).  Samantha [name] Samantha [name], DPM      [place] LOCATION: [address] [place] [address]                   Office [phone]   [place] LOCATION: [address] [place], [place] [address] Office [phone]     [1]  Allergies Allergen Reactions   Erythromycin Diarrhea and Rash   Vibra -Tab [Doxycycline ] Hives   Benzalkonium Chloride Rash   Erythromycin Rash   Fosamax [Alendronate] Nausea Only and Other (See Comments)    Severe stomach cramps   Neo-Bacit-Poly-Lidocaine  Rash   Neomy-Bacit-Polymyx-Pramoxine Rash   Neosporin [Neomycin-Bacitracin Zn-Polymyx] Rash   Penicillins Rash    DID THE REACTION INVOLVE: Swelling of the face/tongue/throat, SOB, or low BP? N Sudden or severe rash/hives, skin peeling, or the inside of the mouth or nose? Y Did it require medical treatment? N When did it last happen?      Childhood  If all above answers are NO, may proceed with cephalosporin use.   Zithromax [Azithromycin] Diarrhea   "

## 2024-05-23 DIAGNOSIS — H6123 Impacted cerumen, bilateral: Secondary | ICD-10-CM | POA: Diagnosis not present

## 2024-05-23 DIAGNOSIS — H7292 Unspecified perforation of tympanic membrane, left ear: Secondary | ICD-10-CM | POA: Diagnosis not present

## 2024-08-31 ENCOUNTER — Ambulatory Visit: Admitting: Podiatry
